# Patient Record
Sex: Male | Born: 1960 | Race: Asian | Hispanic: No | Marital: Married | Smoking: Former smoker
Health system: Southern US, Community
[De-identification: ages and names within clinical notes are randomized; demographics above are authoritative.]

## PROBLEM LIST (undated history)

## (undated) DIAGNOSIS — C801 Malignant (primary) neoplasm, unspecified: Secondary | ICD-10-CM

## (undated) DIAGNOSIS — K209 Esophagitis, unspecified without bleeding: Secondary | ICD-10-CM

## (undated) DIAGNOSIS — C3490 Malignant neoplasm of unspecified part of unspecified bronchus or lung: Secondary | ICD-10-CM

## (undated) DIAGNOSIS — G629 Polyneuropathy, unspecified: Secondary | ICD-10-CM

## (undated) DIAGNOSIS — R4702 Dysphasia: Secondary | ICD-10-CM

## (undated) DIAGNOSIS — K228 Other specified diseases of esophagus: Secondary | ICD-10-CM

## (undated) HISTORY — DX: Other specified diseases of esophagus: K22.8

## (undated) HISTORY — DX: Polyneuropathy, unspecified: G62.9

## (undated) HISTORY — DX: Malignant (primary) neoplasm, unspecified: C80.1

## (undated) HISTORY — DX: Esophagitis, unspecified without bleeding: K20.90

## (undated) HISTORY — DX: Dysphasia: R47.02

## (undated) HISTORY — DX: Esophagitis, unspecified: K20.9

---

## 2012-03-12 ENCOUNTER — Ambulatory Visit (INDEPENDENT_AMBULATORY_CARE_PROVIDER_SITE_OTHER): Payer: PRIVATE HEALTH INSURANCE | Admitting: Family Medicine

## 2012-03-12 VITALS — BP 132/79 | HR 67 | Temp 98.0°F | Resp 16 | Ht 62.5 in | Wt 135.6 lb

## 2012-03-12 DIAGNOSIS — K219 Gastro-esophageal reflux disease without esophagitis: Secondary | ICD-10-CM

## 2012-03-12 DIAGNOSIS — J029 Acute pharyngitis, unspecified: Secondary | ICD-10-CM

## 2012-03-12 LAB — POCT CBC
Granulocyte percent: 61 %G (ref 37–80)
MID (cbc): 0.5 (ref 0–0.9)
MPV: 8.2 fL (ref 0–99.8)
POC Granulocyte: 4.7 (ref 2–6.9)
POC LYMPH PERCENT: 33 %L (ref 10–50)
Platelet Count, POC: 261 10*3/uL (ref 142–424)
RBC: 5.27 M/uL (ref 4.69–6.13)
RDW, POC: 15.2 %

## 2012-03-12 LAB — POCT RAPID STREP A (OFFICE): Rapid Strep A Screen: NEGATIVE

## 2012-03-12 MED ORDER — OMEPRAZOLE 40 MG PO CPDR: 40.0000 mg | DELAYED_RELEASE_CAPSULE | Freq: Every day | ORAL | Status: DC

## 2012-03-12 NOTE — Patient Instructions (Addendum)
Try the medication. If by Monday you are not doing better call back and we will go ahead and work on making an ENT referral for you. If worse at any time return.

## 2012-03-12 NOTE — Progress Notes (Signed)
Subjective: Patient is here complaining of a one-week history of sore throat. His food catches in his throat came back up, especially today. He is [place] and states limited [place]. His relative was helping interpret.  Objective: Throat clear neck supple without nodes points about mid epiglottis region as to where he hurts most. Chest clear. Heart regular without murmurs.  Assessment: Pharyngitis, atypical GERD  Plan: Strep screen Throat cultures negative CBC Then decide rx..  Results for orders placed in visit on 03/12/12  POCT RAPID STREP A (OFFICE)      Component Value Range   Rapid Strep A Screen Negative  Negative   POCT CBC      Component Value Range   WBC 7.7  4.6 - 10.2 (K/uL)   Lymph, poc 2.5  0.6 - 3.4    POC LYMPH PERCENT 33.0  10 - 50 (%L)   MID (cbc) 0.5  0 - 0.9    POC MID % 6.0  0 - 12 (%M)   POC Granulocyte 4.7  2 - 6.9    Granulocyte percent 61.0  37 - 80 (%G)   RBC 5.27  4.69 - 6.13 (M/uL)   Hemoglobin 15.7  14.1 - 18.1 (g/dL)   HCT, POC 16.1  09.6 - 53.7 (%)   MCV 88.5  80 - 97 (fL)   MCH, POC 29.8  27 - 31.2 (pg)   MCHC 33.7  31.8 - 35.4 (g/dL)   RDW, POC 04.5     Platelet Count, POC 261  142 - 424 (K/uL)   MPV 8.2  0 - 99.8 (fL)    with the above labs being normal, it is my opinion that this is probably pharyngitis/dysphasia being caused by reflux. Will prescribe omeprazole, if not doing better over the next few days, then we will go ahead and make an ENT referral. They are to call back if he is not doing better. If at any time he is worse he is to come on in.

## 2012-03-14 LAB — CULTURE, GROUP A STREP

## 2012-04-20 ENCOUNTER — Ambulatory Visit (INDEPENDENT_AMBULATORY_CARE_PROVIDER_SITE_OTHER): Payer: PRIVATE HEALTH INSURANCE | Admitting: Internal Medicine

## 2012-04-20 ENCOUNTER — Ambulatory Visit: Payer: PRIVATE HEALTH INSURANCE

## 2012-04-20 VITALS — BP 112/68 | HR 69 | Temp 97.4°F | Resp 16 | Ht 63.0 in | Wt 133.0 lb

## 2012-04-20 DIAGNOSIS — R079 Chest pain, unspecified: Secondary | ICD-10-CM

## 2012-04-20 MED ORDER — OMEPRAZOLE 40 MG PO CPDR: 40.0000 mg | DELAYED_RELEASE_CAPSULE | Freq: Every day | ORAL | Status: DC

## 2012-04-20 MED ORDER — SUCRALFATE 1 GM/10ML PO SUSP: 1.0000 g | Freq: Four times a day (QID) | ORAL | Status: DC

## 2012-04-20 NOTE — Progress Notes (Signed)
  Subjective:    Patient ID: Bruce [name], male    DOB: 04-Nov-1961, 51 y.o.   MRN: [id number]  HPIHe was here one month ago with dysphasia and was put on omeprazole 40 mg daily. He has had no response. He now has postprandial Dyspepsia with pain at the xiphoid process. He feels like food sticks in the laryngeal area as well as at the xiphoid point. He describes this as chest pain but denies palpitations or shortness of breath and has no history of hypertension. He is a smoker. He drinks one beer a day. There is no history of nocturnal epigastric pain but he does have some early morning burning in the epigastric area. His appetite remains good and there's been no weight loss. 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  HENT: Negative for congestion, rhinorrhea, sneezing, mouth sores and voice change.   Eyes: Positive for redness.  Respiratory: Negative for chest tightness and shortness of breath.   Cardiovascular: Negative for leg swelling.  Gastrointestinal: Negative for nausea, vomiting, diarrhea, constipation and abdominal distention.  Genitourinary: Negative for dysuria and difficulty urinating.  Hematological: Negative for adenopathy.       Objective:   Physical Exam  Constitutional: He is oriented to person, place, and time. He appears well-developed and well-nourished.  HENT:  Nose: Nose normal.  Mouth/Throat: Oropharynx is clear and moist.  Eyes: Pupils are equal, round, and reactive to light.  Neck: Normal range of motion. Neck supple. No tracheal deviation present. No thyromegaly present.  Cardiovascular: Normal rate, regular rhythm, normal heart sounds and intact distal pulses.  Exam reveals no gallop and no friction rub.   No murmur  heard. Pulmonary/Chest: Effort normal and breath sounds normal. No respiratory distress. He has no wheezes. He exhibits no tenderness.  Abdominal: Soft. Bowel sounds are normal. He exhibits no mass. There is no tenderness.  Lymphadenopathy:    He has no cervical adenopathy.  Neurological: He is alert and oriented to person, place, and time.         [facility] reading (PRIMARY) by  Dr. Annaliese Saez=Wide mediastinum/?aortic aneurysm- Stat reading by radiologist said everything is normal    Note that CBC was within normal limits one month ago Assessment & Plan:  Problem #1 dysphagia With postprandial dyspepsia unresponsive to Prilosec 40 Problem #2 abnormal chest x-ray ? Problem #3 esophageal pain postprandial-?stricture  Add Carafate to current therapy 1 g 4 times a day Set up evaluation by endoscopy H. Pylori IgG/metabolic profile

## 2012-04-21 ENCOUNTER — Encounter: Payer: Self-pay | Admitting: Internal Medicine

## 2012-04-21 LAB — COMPREHENSIVE METABOLIC PANEL
ALT: 18 U/L (ref 0–53)
AST: 24 U/L (ref 0–37)
Albumin: 4.4 g/dL (ref 3.5–5.2)
Alkaline Phosphatase: 63 U/L (ref 39–117)
Calcium: 9.1 mg/dL (ref 8.4–10.5)
Chloride: 106 mEq/L (ref 96–112)
Potassium: 4.1 mEq/L (ref 3.5–5.3)
Sodium: 140 mEq/L (ref 135–145)
Total Protein: 7.4 g/dL (ref 6.0–8.3)

## 2012-04-24 ENCOUNTER — Encounter: Payer: Self-pay | Admitting: Internal Medicine

## 2012-05-25 ENCOUNTER — Ambulatory Visit (INDEPENDENT_AMBULATORY_CARE_PROVIDER_SITE_OTHER): Payer: PRIVATE HEALTH INSURANCE | Admitting: Internal Medicine

## 2012-05-25 ENCOUNTER — Encounter: Payer: Self-pay | Admitting: Internal Medicine

## 2012-05-25 VITALS — BP 108/70 | HR 82 | Ht 62.5 in

## 2012-05-25 NOTE — Patient Instructions (Addendum)
You have been scheduled for an endoscopy with dilation with propofol. Please follow written instructions given to you at your visit today.  

## 2012-05-25 NOTE — Progress Notes (Signed)
HISTORY OF PRESENT ILLNESS:  Bruce [name] is a 51 y.o. male , non-English speaking [place] who is sent today regarding dysphagia. He is accompanied by his daughter who translates. Also, a [employer]. Translator. The history is that of one month of intermittent solid food dysphagia item such as rice. He points principally to the pharyngeal area as well as the xiphoid region. He has lost 3-4 pounds. No nausea, vomiting, heartburn, abdominal pain, or lower GI symptoms. He was seen by his primary provider and placed on omeprazole and Carafate. These have not helped. Blood work was obtained including CBC and comprehensive metabolic panel. These were normal. Helicobacter pylori antibody was normal.  REVIEW OF SYSTEMS:  All non-GI ROS negative.  Past Medical History  Diagnosis Date  . Esophagitis     History reviewed. No pertinent past surgical history.  Social History Bruce [name]  reports that he has quit smoking. He does not have any smokeless tobacco history on file. He reports that he does not drink alcohol or use illicit drugs.  family history includes Breast cancer in his sister.  No Known Allergies     PHYSICAL EXAMINATION: Vital signs: BP 108/70  Pulse 82  Ht 5' 2.5" (1.588 m)  SpO2 98%  Constitutional: generally well-appearing, no acute distress Psychiatric: alert and oriented x3, cooperative Eyes: extraocular movements intact, anicteric, conjunctiva pink Mouth: oral pharynx moist, no lesions Neck: supple no lymphadenopathy Cardiovascular: heart regular rate and rhythm, no murmur Lungs: clear to auscultation bilaterally Abdomen: soft, nontender, nondistended, no obvious ascites, no peritoneal signs, normal bowel sounds, no organomegaly Extremities: no lower extremity edema bilaterally Skin: no lesions on visible extremities Neuro: No focal deficits.   ASSESSMENT:  #1. One month history of intermittent solid food dysphagia. Rule out stricture, benign or  malignant.   PLAN:  #1. Upper endoscopy with possible esophageal dilation.The nature of the procedure, as well as the risks, benefits, and alternatives were carefully and thoroughly reviewed with the patient. Ample time for discussion and questions allowed. The patient understood, was satisfied, and agreed to proceed.

## 2012-05-28 ENCOUNTER — Encounter: Payer: Self-pay | Admitting: Internal Medicine

## 2012-05-28 ENCOUNTER — Ambulatory Visit (AMBULATORY_SURGERY_CENTER): Payer: PRIVATE HEALTH INSURANCE | Admitting: Internal Medicine

## 2012-05-28 VITALS — BP 135/93 | HR 63 | Temp 97.3°F | Resp 12 | Ht 63.0 in | Wt 133.0 lb

## 2012-05-28 DIAGNOSIS — K2289 Other specified disease of esophagus: Secondary | ICD-10-CM

## 2012-05-28 DIAGNOSIS — C801 Malignant (primary) neoplasm, unspecified: Secondary | ICD-10-CM | POA: Insufficient documentation

## 2012-05-28 DIAGNOSIS — C159 Malignant neoplasm of esophagus, unspecified: Secondary | ICD-10-CM

## 2012-05-28 DIAGNOSIS — K209 Esophagitis, unspecified without bleeding: Secondary | ICD-10-CM

## 2012-05-28 DIAGNOSIS — K228 Other specified diseases of esophagus: Secondary | ICD-10-CM

## 2012-05-28 DIAGNOSIS — C155 Malignant neoplasm of lower third of esophagus: Secondary | ICD-10-CM

## 2012-05-28 DIAGNOSIS — R1310 Dysphagia, unspecified: Secondary | ICD-10-CM

## 2012-05-28 HISTORY — DX: Other specified disease of esophagus: K22.89

## 2012-05-28 HISTORY — PX: ESOPHAGOGASTRODUODENOSCOPY: SHX1529

## 2012-05-28 HISTORY — DX: Malignant (primary) neoplasm, unspecified: C80.1

## 2012-05-28 MED ORDER — SODIUM CHLORIDE 0.9 % IV SOLN: 500.0000 mL | INTRAVENOUS | Status: DC

## 2012-05-28 NOTE — Op Note (Signed)
[facility] [address] [place], [place]  [address]  ENDOSCOPY PROCEDURE REPORT  PATIENT:  Bruce, [name]  MR#:  [id number] BIRTHDATE:  04/15/1961, 50 yrs. old  GENDER:  male  ENDOSCOPIST:  Wilhemina Bonito. Eda Keys, MD Referred by:  Ellamae Sia, M.D.  PROCEDURE DATE:  05/28/2012 PROCEDURE:  EGD with biopsy, 43239 ASA CLASS:  Class I INDICATIONS:  dysphagia  MEDICATIONS:   MAC sedation, administered by CRNA, propofol (Diprivan) 170 mg IV TOPICAL ANESTHETIC:  none  DESCRIPTION OF PROCEDURE:   After the risks benefits and alternatives of the procedure were thoroughly explained, informed consent was obtained.  The [facility] GIF-H180 [id number] endoscope was introduced through the mouth and advanced to the second portion of the duodenum, without limitations.  The instrument was slowly withdrawn as the mucosa was fully examined. <<PROCEDUREIMAGES>>  A bulky partially obstructing circumferential mass, 4-5cm in lenth (33 - 38 cm and ending at [facility]) was found in the distal esophagus. Multiple bx taken. The stomach was entered and closely examined. The antrum, angularis, and lesser curvature were well visualized, including a retroflexed view of the cardia and fundus. The stomach wall was normally distensable. The scope passed easily through the pylorus into the duodenum.  The duodenal bulb was normal in appearance, as was the postbulbar duodenum.    Retroflexed views revealed a small hiatal hernia.    The scope was then withdrawn from the patient and the procedure completed.  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 Eda Keys, MD  CC:  Ellamae Sia, MD;   The  Patient  n. eSIGNED:   Wilhemina Bonito. Eda Keys at 05/28/2012 01:55 PM  Elmon Else, [phone]

## 2012-05-28 NOTE — Patient Instructions (Addendum)

## 2012-05-28 NOTE — Progress Notes (Signed)
Patient did not experience any of the following events: a burn prior to discharge; a fall within the facility; wrong site/side/patient/procedure/implant event; or a hospital transfer or hospital admission upon discharge from the facility. (G8907) Patient did not have preoperative order for IV antibiotic SSI prophylaxis. (G8918)  

## 2012-05-28 NOTE — Progress Notes (Signed)
Pt. Here with  daughter and interpreter   Encouraged to ask questions and to involve pt. In teaching and conversation about plan of care. Daughter verbalized understanding.  Advised that Dr. Lamar Sprinkles office would schedule appointments and contact pt.

## 2012-05-29 ENCOUNTER — Telehealth: Payer: Self-pay | Admitting: *Deleted

## 2012-05-29 ENCOUNTER — Other Ambulatory Visit: Payer: Self-pay | Admitting: Internal Medicine

## 2012-05-29 ENCOUNTER — Telehealth: Payer: Self-pay | Admitting: Internal Medicine

## 2012-05-29 ENCOUNTER — Ambulatory Visit (INDEPENDENT_AMBULATORY_CARE_PROVIDER_SITE_OTHER)
Admission: RE | Admit: 2012-05-29 | Discharge: 2012-05-29 | Disposition: A | Discharge status: Home / Self Care | Payer: PRIVATE HEALTH INSURANCE | Source: Ambulatory Visit | Attending: Internal Medicine | Admitting: Internal Medicine

## 2012-05-29 ENCOUNTER — Telehealth: Payer: Self-pay

## 2012-05-29 DIAGNOSIS — R079 Chest pain, unspecified: Secondary | ICD-10-CM

## 2012-05-29 DIAGNOSIS — R109 Unspecified abdominal pain: Secondary | ICD-10-CM

## 2012-05-29 DIAGNOSIS — C159 Malignant neoplasm of esophagus, unspecified: Secondary | ICD-10-CM

## 2012-05-29 NOTE — Telephone Encounter (Signed)
Spoke with pts daughter and she is aware, they will come to the office today for xrays.

## 2012-05-29 NOTE — Telephone Encounter (Signed)
  Follow up Call-  Call back number 05/28/2012  Post procedure Call Back phone  # [phone]  Permission to leave phone message Yes     Patient questions:  Do you have a fever, pain , or abdominal swelling? no Pain Score  0 *  Have you tolerated food without any problems? yes  Have you been able to return to your normal activities? yes  Do you have any questions about your discharge instructions: Diet   no Medications  no Follow up visit  no  Do you have questions or concerns about your Care? yes  Actions: * If pain score is 4 or above: No action needed, pain <4.  Spoke with pt. Daughter, interpreting.  Encouraged to call office if questions or concerns.

## 2012-05-29 NOTE — Telephone Encounter (Signed)
Pt had endoscopy yesterday.  Esophageal mass was found on exam.  Pts daughter reports that patient is having pain 5-6 out of 10 in mid chest area.  Reports that it feels like bloating.  Also reports burning in throat.  Explained that a sore throat is common after this procedure and that he could be experiencing gas in chest.  Will report symptoms to Dr Marina Goodell.

## 2012-05-29 NOTE — Telephone Encounter (Signed)
Dr. Rhea Belton as doc of the day please see the note below, Dr. Marina Goodell is not here.

## 2012-05-29 NOTE — Telephone Encounter (Signed)
Pt scheduled for CT of CAP w and w/o contrast at [facility] 06/02/12 arrival time 7:45am for an 8am appt. Referral made to the cancer center also. Pt aware of appt date and time. Pt to go by [facility] radiology today to pick up contrast.

## 2012-05-29 NOTE — Telephone Encounter (Signed)
I recommend PA and lateral chest film and 2 view abdomen film to rule out complication. I doubt complication has occurred, and he could have retained air after the procedure which explains the bloating. If x-ray is negative, could use sucralfate 1 g 4 times a day liquid to help with his discomfort.

## 2012-06-01 ENCOUNTER — Encounter: Payer: Self-pay | Admitting: Oncology

## 2012-06-01 ENCOUNTER — Other Ambulatory Visit: Payer: Self-pay | Admitting: *Deleted

## 2012-06-01 ENCOUNTER — Telehealth: Payer: Self-pay | Admitting: Oncology

## 2012-06-01 ENCOUNTER — Ambulatory Visit (HOSPITAL_BASED_OUTPATIENT_CLINIC_OR_DEPARTMENT_OTHER): Payer: PRIVATE HEALTH INSURANCE | Admitting: Oncology

## 2012-06-01 ENCOUNTER — Ambulatory Visit: Payer: PRIVATE HEALTH INSURANCE

## 2012-06-01 VITALS — BP 130/82 | HR 72 | Temp 96.8°F | Ht 62.0 in | Wt 130.5 lb

## 2012-06-01 DIAGNOSIS — C155 Malignant neoplasm of lower third of esophagus: Secondary | ICD-10-CM

## 2012-06-01 DIAGNOSIS — C159 Malignant neoplasm of esophagus, unspecified: Secondary | ICD-10-CM

## 2012-06-01 MED ORDER — HYDROCODONE-ACETAMINOPHEN 7.5-500 MG/15ML PO SOLN: 15.0000 mL | Freq: Four times a day (QID) | ORAL | Status: DC | PRN

## 2012-06-01 NOTE — Telephone Encounter (Signed)
appts made and printed for pt,Per Almira Coaster she is calling rad onc tomorrow and will call  the pt daughter

## 2012-06-01 NOTE — Telephone Encounter (Signed)
S/w pt's dtr Mai today confirming appt for today @ 3:30 pm. Also called [place] and emailed her requesting [place] translator for this appt.

## 2012-06-01 NOTE — Progress Notes (Signed)
Met with patient and daughter to introduce self and navigator role.  Contact phone numbers given to patient and daughter.   Referrals made to dietician and SW and requested to meet patient during MD visit on 06/04/12. Reviewed with patient not to drive or use heavy machinery/equipment while taking pain medicine and explained reasons why.  Encouraged patient to avoid solid foods like steak and foods that can cause choking.  Encouraged liquids like soup and ice cream per MD recommendation. They verbalized understanding and were without questions. Interpreter was present during the visit.  Will continue to follow.

## 2012-06-01 NOTE — Progress Notes (Signed)
[facility] New Patient Consult   Referring MD: Vern Guerette 51 y.o.  November 05, 1961    Reason for Referral: Esophagus cancer     HPI: [name] is seen today with his daughter and an interpreter. He reports a one-month history of solid dysphagia and recent liquid dysphagia. He also has pain in the subxiphoid area. The pain is intermittent and makes it difficult to sleep. He regurgitates food after eating. He was placed on omeprazole and Carafate without relief of his symptoms.  He saw Dr. Marina Goodell on 05/25/2012. He was taken for an upper endoscopy procedure on 05/28/2012. A partially obstructing circumferential mass was noted in the distal esophagus (33-38 cm) and ending at the GE junction. Multiple biopsies were taken. There was a small hiatal hernia. The stomach and duodenum appeared normal. The pathology from the biopsy is pending. He has been scheduled for staging CT scans on 06/02/2012.    Past Medical History  Diagnosis Date  .  esophagus mass   05/28/2012     Past surgical history: None  Family History  Problem Relation Age of Onset  . Breast cancer Sister   No other family history of cancer  Current outpatient prescriptions:HYDROcodone-acetaminophen (LORTAB) 7.5-500 MG/15ML solution, Take 15 mLs by mouth every 6 (six) hours as needed for pain., Disp: 300 mL, Rfl: 0  Allergies: No Known Allergies  Social History: He lives with his wife and 2 daughters in [place]. He works as a Psychologist, occupational. He quit smoking cigarettes 5 years ago. He drinks approximately 6 bottles of beer on the weekend. No risk factor for HIV or hepatitis.   History  Alcohol Use  . 8.4 oz/week  . 14 Cans of beer per week    History  Smoking status  . Former Smoker -- 0.4 packs/day  Smokeless tobacco  . Never Used     ROS:   Positives include: Solid and liquid dysphasia, 10 pound weight loss, low substernal/subxiphoid chest discomfort at night, regurgitates food  after eating, right hand and numbness for the past one year  A complete ROS was otherwise negative.  Physical Exam:  Blood pressure 130/82, pulse 72, temperature 96.8 F (36 C), temperature source Oral, height 5\' 2"  (1.575 m), weight 130 lb 8 oz (59.194 kg).  HEENT: Oropharynx without visible mass, neck without mass Lungs: Clear bilaterally Cardiac: Regular rate and rhythm Abdomen: No hepatosplenomegaly, no mass, tender in the subxiphoid region GU: Testes without mass  Vascular: No leg edema Lymph nodes: No cervical, supraclavicular, or inguinal nodes. "Shotty "axillary nodes Neurologic: Alert and oriented, the motor exam appears intact in the upper and lower extremities, the deep tendon reflexes are present at the knees and biceps Skin: No rash, multiple small scars at the lower legs   LAB:  CBC  Lab Results  Component Value Date   WBC 7.7 03/12/2012   HGB 15.7 03/12/2012   HCT 46.6 03/12/2012   MCV 88.5 03/12/2012     CMP      Component Value Date/Time   NA 140 04/20/2012 1647   K 4.1 04/20/2012 1647   CL 106 04/20/2012 1647   CO2 24 04/20/2012 1647   GLUCOSE 93 04/20/2012 1647   BUN 17 04/20/2012 1647   CREATININE 1.10 04/20/2012 1647   CALCIUM 9.1 04/20/2012 1647   PROT 7.4 04/20/2012 1647   ALBUMIN 4.4 04/20/2012 1647   AST 24 04/20/2012 1647   ALT 18 04/20/2012 1647   ALKPHOS 63 04/20/2012 1647  BILITOT 0.7 04/20/2012 1647     Radiology: Scheduled for CTs of the chest, abdomen, and pelvis on 06/02/2012    Assessment/Plan:   1. Distal esophagus mass-status post an endoscopic biopsy on 05/28/2012 with the pathology pending  2. Solid/liquid dysphasia secondary to the obstructing esophagus mass  3. Odynophagia/subxiphoid pain secondary to the esophagus mass   Disposition:  He is referred for oncology evaluation after being diagnosed with an esophagus mass. The pathology is pending, but the clinical history and endoscopic findings are consistent with a diagnosis of esophagus  cancer.  I discussed the likely diagnosis and standard treatment plan with the patient and his daughter. He is scheduled for  staging CT scans on 06/02/2012. We will refer him for an endoscopic ultrasound if there is no evidence of distant metastatic disease. He will be referred for a radiation oncology consultation.  Mr.Spilman appears to be in excellent health and will be a candidate for an esophagectomy procedure if there is no distant metastatic disease. We will make a surgical referral pending the staging evaluation.  He was prescribed hydrocodone elixir to use as needed for pain. He will continue a liquid diet. Mr.Toppins will return for an office visit on 06/04/2012. He will be scheduled for an appointment with the cancer Center nutritionist on 06/04/2012 .  Terrie Haring 06/01/2012, 6:06 PM

## 2012-06-01 NOTE — Progress Notes (Signed)
Patient came in today as a new patient with his daughter,and he has one insurance Med cost.I did explain to his daughter our financial assistance and co-pay assistance program we offer here at the cancer center,but his daughter said that she think they are oh kay far as financial assistance.

## 2012-06-02 ENCOUNTER — Ambulatory Visit (HOSPITAL_COMMUNITY)
Admission: RE | Admit: 2012-06-02 | Discharge: 2012-06-02 | Disposition: A | Discharge status: Home / Self Care | Payer: PRIVATE HEALTH INSURANCE | Source: Ambulatory Visit | Attending: Internal Medicine | Admitting: Internal Medicine

## 2012-06-02 ENCOUNTER — Other Ambulatory Visit: Payer: Self-pay | Admitting: Internal Medicine

## 2012-06-02 ENCOUNTER — Encounter: Payer: Self-pay | Admitting: Radiation Oncology

## 2012-06-02 DIAGNOSIS — C159 Malignant neoplasm of esophagus, unspecified: Secondary | ICD-10-CM | POA: Insufficient documentation

## 2012-06-02 DIAGNOSIS — K449 Diaphragmatic hernia without obstruction or gangrene: Secondary | ICD-10-CM | POA: Insufficient documentation

## 2012-06-02 DIAGNOSIS — I708 Atherosclerosis of other arteries: Secondary | ICD-10-CM | POA: Insufficient documentation

## 2012-06-02 DIAGNOSIS — R1310 Dysphagia, unspecified: Secondary | ICD-10-CM | POA: Insufficient documentation

## 2012-06-02 DIAGNOSIS — I700 Atherosclerosis of aorta: Secondary | ICD-10-CM | POA: Insufficient documentation

## 2012-06-02 DIAGNOSIS — R079 Chest pain, unspecified: Secondary | ICD-10-CM | POA: Insufficient documentation

## 2012-06-02 DIAGNOSIS — J8410 Pulmonary fibrosis, unspecified: Secondary | ICD-10-CM | POA: Insufficient documentation

## 2012-06-02 MED ORDER — IOHEXOL 300 MG/ML  SOLN
100.0000 mL | Freq: Once | INTRAMUSCULAR | Status: AC | PRN
  Administered 2012-06-02: 100 mL via INTRAVENOUS

## 2012-06-03 ENCOUNTER — Encounter: Payer: Self-pay | Admitting: *Deleted

## 2012-06-04 ENCOUNTER — Ambulatory Visit: Payer: PRIVATE HEALTH INSURANCE | Admitting: Nutrition

## 2012-06-04 ENCOUNTER — Encounter: Payer: Self-pay | Admitting: *Deleted

## 2012-06-04 ENCOUNTER — Telehealth: Payer: Self-pay | Admitting: Oncology

## 2012-06-04 ENCOUNTER — Ambulatory Visit
Admission: RE | Admit: 2012-06-04 | Discharge: 2012-06-04 | Disposition: A | Discharge status: Home / Self Care | Payer: PRIVATE HEALTH INSURANCE | Source: Ambulatory Visit | Attending: Radiation Oncology | Admitting: Radiation Oncology

## 2012-06-04 ENCOUNTER — Ambulatory Visit (HOSPITAL_BASED_OUTPATIENT_CLINIC_OR_DEPARTMENT_OTHER): Payer: PRIVATE HEALTH INSURANCE | Admitting: Oncology

## 2012-06-04 VITALS — BP 129/81 | HR 75 | Temp 96.9°F | Ht 62.0 in | Wt 128.2 lb

## 2012-06-04 VITALS — BP 109/68 | HR 97 | Temp 98.2°F | Wt 129.4 lb

## 2012-06-04 DIAGNOSIS — C155 Malignant neoplasm of lower third of esophagus: Secondary | ICD-10-CM | POA: Insufficient documentation

## 2012-06-04 DIAGNOSIS — Z510 Encounter for antineoplastic radiation therapy: Secondary | ICD-10-CM | POA: Insufficient documentation

## 2012-06-04 DIAGNOSIS — R1310 Dysphagia, unspecified: Secondary | ICD-10-CM

## 2012-06-04 DIAGNOSIS — Z79899 Other long term (current) drug therapy: Secondary | ICD-10-CM | POA: Insufficient documentation

## 2012-06-04 DIAGNOSIS — K449 Diaphragmatic hernia without obstruction or gangrene: Secondary | ICD-10-CM | POA: Insufficient documentation

## 2012-06-04 DIAGNOSIS — Z87891 Personal history of nicotine dependence: Secondary | ICD-10-CM | POA: Insufficient documentation

## 2012-06-04 DIAGNOSIS — G4700 Insomnia, unspecified: Secondary | ICD-10-CM | POA: Insufficient documentation

## 2012-06-04 DIAGNOSIS — C159 Malignant neoplasm of esophagus, unspecified: Secondary | ICD-10-CM

## 2012-06-04 DIAGNOSIS — Z803 Family history of malignant neoplasm of breast: Secondary | ICD-10-CM | POA: Insufficient documentation

## 2012-06-04 DIAGNOSIS — K209 Esophagitis, unspecified without bleeding: Secondary | ICD-10-CM | POA: Insufficient documentation

## 2012-06-04 NOTE — Progress Notes (Signed)
[facility] Brief Psychosocial Assessment Clinical Social Work  Clinical Social Work was referred by patient navigator for assessment of psychosocial needs.  Clinical Social Worker met with patient and patient's daughter in office at [facility] to offer support and assess for needs.  Pt speaks [place] and his daughter was present to interpret information.  Pt and daughter stated they have an additional interpreter with them "most of the time".  CSW encouraged pt to always have an interpreter with him at his appointments to make sure all information is delivered correctly.  CSW will follow up with pt navigator and scheduling regarding interpreter.  Pt stated he was doing well and had not needs at this time.  Pt did have questions regarding financial information and payment of medical bills.  CSW encouraged pt to discuss his insurance concerns, and ask about the financial assistance application when he meets with the financial advocate today.  CSW informed pt of the resources available and support services at [facility].  CSW provided pt with contact information and encouraged him and/or family to call with any additional questions or concerns.      Tamala Julian, MSW, LCSW Clinical Social Worker [facility] [phone]

## 2012-06-04 NOTE — Telephone Encounter (Signed)
appts made and printed for pt aom °

## 2012-06-04 NOTE — Assessment & Plan Note (Signed)
This patient is a 51 year old patient of Dr. Kalman Drape with a new diagnosis of esophageal cancer.  MEDICAL HISTORY INCLUDES:  Tobacco, alcohol, hiatal hernia, and dysphagia.  MEDICATIONS INCLUDE:  Lortab, Carafate, and omeprazole.  LABORATORIES:  Reviewed.  HEIGHT:  62 inches. WEIGHT:  130.5 pounds. USUAL BODY WEIGHT:  140 pounds. BMI:  23.8.  ESTIMATED NUTRITION NEEDS:  1780-2060 calories, 88-103 g protein, 2 L fluid.  Patient and daughter present with [place] interpreter.  Patient reports solid food dysphagia, but no problems with liquids.  He has experienced a 10-pound weight loss.  Per daughter the patient is now eating well.  He has Ensure at home, but does not like it.  Patient consumes some protein foods such as fish and pork, but no dairy products and very few beans.  He eats rice and soup with every meal.  He does have questions about what he should avoid during treatment.  NUTRITION DIAGNOSIS:  Food and nutrition-related knowledge deficit related to diagnosis of esophageal cancer and associated treatments as evidenced by no prior need for nutrition-related information.  INTERVENTION:  I have educated the patient and daughter on the importance of small frequent meals with higher calorie, higher protein foods to minimize further weight loss.  I have encouraged the patient to chop foods up small and add liquids to them as appropriate to help in swallowing.  I have recommended the patient consume meals and snacks 6 times a day.  I provided him with some samples of juice-based supplements and encouraged him to consume 1-2 of these daily in addition to his oral diet.  I provided my contact information for questions and concerns.  MONITORING/EVALUATION (GOALS):  The patient will tolerate increased oral intake to minimize further weight loss.  NEXT VISIT:  Patient agrees to call with questions or concerns.  ______________________________ Zenovia Jarred, RD,CSO,LDN Clinical Nutrition  Specialist BN/MEDQ  D:  06/04/2012  T:  06/04/2012  Job:  [id number]

## 2012-06-04 NOTE — Progress Notes (Signed)
   [facility]    OFFICE PROGRESS NOTE   INTERVAL HISTORY:   Bruce [name] returns as scheduled. The hydrocodone elixir has helped the pain. Bruce [name] saw Dr. Mitzi Hansen earlier today. 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  Objective:  Physical exam not performed today  X-rays: A CT of the chest on 06/02/2012 revealed a mild distal esophageal wall thickening. No discrete mass or adjacent enlarged lymph nodes were identified. No mediastinal or hilar nodes. No pleural or pericardial effusion. No pulmonary nodule. The liver appeared normal. No adrenal mass. No enlarged abdominal pelvic lymph nodes. 6 mm celiac node. No ascites or peritoneal nodularity. Diffuse gastric wall thickening.  Lab Results:  Lab Results  Component Value Date   WBC 7.7 03/12/2012   HGB 15.7 03/12/2012   HCT 46.6 03/12/2012   MCV 88.5 03/12/2012      Medications: I have reviewed the patient's current medications.  Assessment/Plan: 1.Distal esophagus mass-status post an endoscopic biopsy on 05/28/2012 with the pathology confirming invasive squamous cell carcinoma  -staging CT scans of the chest, abdomen, and pelvis on 06/02/2012-negative for evidence of metastatic disease  2. Solid/liquid dysphasia secondary to the obstructing esophagus mass   3. 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 Bruce [name] appears to have localized disease based on the staging CT scans. Bruce [name] will be referred for a staging PET scan and endoscopic ultrasound evaluation to determine whether Bruce [name] should be treated with chemotherapy/irradiation or surgery. Bruce [name] understands the majority of patients with esophagus cancer are treated with initial chemotherapy/radiation.  I discussed treatment options and the specifics of the Taxol/carboplatin regimen with Mr.Chandra and his  daughter via an interpreter. I will recommend chemotherapy with weekly Taxol/carboplatin and concurrent radiation. I discussed the case with Dr. Mitzi Hansen and the plan is to initiate therapy during the week of 06/15/2012. If Bruce [name] is found to have an early-stage lesion on the endoscopic ultrasound we will make a surgical referral.  I reviewed the potential toxicities associated with Taxol/carboplatin including a chance for nausea/vomiting, mucositis, diarrhea, alopecia, and hematologic toxicity. We discussed the chance of an allergic reaction, neuropathy, and bone pain. Bruce [name] appears to have adequate peripheral IV access for chemotherapy. Bruce [name] will attend a chemotherapy teaching class.  The first cycle of Taxol/carboplatin has been scheduled for 06/16/2012. Bruce [name] will be seen for an office visit that day.   Thornton Papas, MD  06/04/2012  5:57 PM

## 2012-06-04 NOTE — Progress Notes (Signed)
Bruce [name] in for consult today for Esophageal Cancer.  He reports vis Daughter and interpreter.  Continues to have discomfort in the epigastric region but lessened since starting Carafate and Omeprazole.  Eating softer foods due to Dysphagia.  Has lost ~ 10 lbs within the past month.

## 2012-06-05 ENCOUNTER — Telehealth: Payer: Self-pay | Admitting: *Deleted

## 2012-06-05 NOTE — Telephone Encounter (Signed)
CALLED PATIENT TO INFORM OF TEST FOR 06-11-12, NO ANSWER, WILL GIVE AN APPT. CARD WHEN HE COMES IN ON Monday - 06-08-12

## 2012-06-06 DIAGNOSIS — C155 Malignant neoplasm of lower third of esophagus: Secondary | ICD-10-CM | POA: Insufficient documentation

## 2012-06-06 NOTE — Progress Notes (Signed)
[facility] Radiation Oncology NEW PATIENT EVALUATION  Name: Bruce [name] MRN: [id number]  Date:   06/04/2012           DOB: Sep 06, 1961  Status: outpatient   CC: No primary provider on file.  Ladene Artist, MD    REFERRING PHYSICIAN: Ladene Artist, MD   DIAGNOSIS: The encounter diagnosis was Malignant neoplasm of lower third of esophagus.    HISTORY OF PRESENT ILLNESS:  Bruce [name] is a 51 y.o. male who is seen today for a new consultation visit regarding his recent diagnosis of esophageal cancer. The patient indicates that he has had a one-month history approximately of some dysphasia. This has worsened and does include liquids to some degree currently. He has lost approximately 10 pounds within the last month. He is also noticed some regurgitation of food after eating as well as some pain in the chest region which they described as being present underneath his anterior chest. The patient took some medications including omeprazole without significant relief.  The patient proceeded with upper endoscopy. This revealed a partially obstructing mass which was noted in the distal esophagus at 33-38 cm. This ended it appeared that the GE junction as best could be seen. Multiple biopsies were taken. A small hiatal hernia was present. The biopsy of this mass returned positive for invasive squamous cell carcinoma.  CT imaging has been obtained. A CT scan of the chest revealed some distal esophageal wall thickening and a small hiatal hernia. There is no evidence of abdominal pelvic metastatic disease. There were some nonspecific diffuse gastric wall thickening possibly related to incomplete distention although another differential involved gastritis. The patient has discussed potential treatment options and has seen Dr. Truett Perna who discussed chemotherapy. I am seeing the patient today for consideration of possible radiation treatment.  PREVIOUS RADIATION THERAPY: No   PAST  MEDICAL HISTORY:  has a past medical history of Esophagitis; Mass of esophagus (05/28/2012); Dysphasia; Neuropathy; Hiatal hernia; and Cancer (05/28/12).     PAST SURGICAL HISTORY:  Past Surgical History  Procedure Date  . Esophagogastroduodenoscopy 05/28/12    with biopsy mass distal esophagus ending ge junction  =invasiver squamous cell ca     FAMILY HISTORY: family history includes Breast cancer in his sister.   SOCIAL HISTORY:  reports that he has quit smoking. He quit smokeless tobacco use about 5 years ago. He reports that he drinks about 8.4 ounces of alcohol per week. He reports that he does not use illicit drugs.   ALLERGIES: Review of patient's allergies indicates no known allergies.   MEDICATIONS:  Current Outpatient Prescriptions  Medication Sig Dispense Refill  . CARAFATE 1 GM/10ML suspension       . HYDROcodone-acetaminophen (LORTAB) 7.5-500 MG/15ML solution Take 15 mLs by mouth every 6 (six) hours as needed for pain.  300 mL  0  . omeprazole (PRILOSEC) 40 MG capsule          REVIEW OF SYSTEMS:  A comprehensive review of systems was negative. except for that listed in the history of present illness    PHYSICAL EXAM:  vitals were not taken for this visit.  General: Well-developed, in no acute distress HEENT: Normocephalic, atraumatic; oral cavity clear Neck: Supple without any lymphadenopathy Cardiovascular: Regular rate and rhythm Respiratory: Clear to auscultation bilaterally GI: Soft, nontender, normal bowel sounds Extremities: No edema present Neuro: No focal deficits     LABORATORY DATA:  Lab Results  Component Value Date   WBC 7.7 03/12/2012  HGB 15.7 03/12/2012   HCT 46.6 03/12/2012   MCV 88.5 03/12/2012   Lab Results  Component Value Date   NA 140 04/20/2012   K 4.1 04/20/2012   CL 106 04/20/2012   CO2 24 04/20/2012   Lab Results  Component Value Date   ALT 18 04/20/2012   AST 24 04/20/2012   ALKPHOS 63 04/20/2012   BILITOT 0.7 04/20/2012        IMPRESSION: Pleasant 51 year old male with a recent diagnosis of esophageal cancer.   PLAN: The patient likely is an appropriate candidate for chemoradiotherapy initially. I have discussed the rationale of radiation in the context of esophageal cancer. I would like to order a PET scan to help that her clarify the local extent of the tumor. This will also help to identify any regional or metastatic disease. Endoscopic ultrasound has not been completed as of yet. Depending on the results of the PET scan this may be warranted. I believe that we're this would really make a difference is that there is not any regional or distant disease seen on the PET scan and we want to see if the patient is presenting with a very early stage tumor which could be managed in a different manner. Given his clinical symptoms I do not believe that this is the case and in all likelihood chemoradiotherapy would be warranted for the patient but we will gather some more information in this regard.  We have tentatively gone ahead and scheduled the patient for a simulation next week such that can proceed with treatment planning. We will potentially be ready to start chemoradiotherapy on 06/15/2012. I discussed with the patient with the help of an interpreter the side effects from treatment which can be expected. I would anticipate a 5-1/2 week course of treatment. All of his questions have been answered.   I spent 60 minutes minutes face to face with the patient and more than 50% of that time was spent in counseling and/or coordination of care.

## 2012-06-08 ENCOUNTER — Telehealth: Payer: Self-pay

## 2012-06-08 ENCOUNTER — Ambulatory Visit
Admission: RE | Admit: 2012-06-08 | Discharge: 2012-06-08 | Disposition: A | Discharge status: Home / Self Care | Payer: PRIVATE HEALTH INSURANCE | Source: Ambulatory Visit | Attending: Radiation Oncology | Admitting: Radiation Oncology

## 2012-06-08 DIAGNOSIS — C155 Malignant neoplasm of lower third of esophagus: Secondary | ICD-10-CM

## 2012-06-08 DIAGNOSIS — C159 Malignant neoplasm of esophagus, unspecified: Secondary | ICD-10-CM

## 2012-06-08 NOTE — Telephone Encounter (Signed)
Message copied by Donata Duff on Mon Jun 08, 2012  8:30 AM ------      Message from: [place]um[place], Melton Alar      Created: Sun Jun 07, 2012  7:29 AM       Melaine Mcphee,      He needs upper EUS (45 min, does not need propofol) for newly diagnosed esophageal cancer.  Radial only.   Ideally this Thursday (this is the second highest priority case to add to this Thursday schedule, after Mr. Jacqulyn Ducking) .  If there is no space, then would do as a lunch time case (tues, wed or Friday).  His children speak english well, but he will need a Systems developer if they cannot be there. Thanks                        Daphene Jaeger, we'll get him in.            dan                  ----- Message -----         From: Cira Rue, RN         Sent: 06/04/2012   5:29 PM           To: Rachael Fee, MD, Donata Duff, CMA, #            Dr. Truett Perna has put a referral in to you for this patient to be seen and have an EUS as soon as possible.  Diagnosis: Invasive squamous cell carcinoma of esophagus.  [place] is [place] and needs a Nurse, learning disability.  His children speak English and usually come with him.  He will be presented at the 06/17/12 cancer conference and Dr. Tyrone Sage has been made aware.      Thank you,      Vicie Mutters

## 2012-06-09 ENCOUNTER — Other Ambulatory Visit: Payer: Self-pay

## 2012-06-09 ENCOUNTER — Other Ambulatory Visit: Payer: Self-pay | Admitting: *Deleted

## 2012-06-09 MED ORDER — DEXAMETHASONE 4 MG PO TABS: ORAL_TABLET | ORAL | Status: AC

## 2012-06-09 MED ORDER — PROCHLORPERAZINE MALEATE 10 MG PO TABS: 10.0000 mg | ORAL_TABLET | Freq: Four times a day (QID) | ORAL | Status: DC | PRN

## 2012-06-09 NOTE — Telephone Encounter (Signed)
I spoke with the pt's daughter and she is aware of the EUS scheduled for Thursday 06/11/12.  Medications were reviewed.  Pt has PET scan at 1130 and will go directly to ENDO afterwards.  Pt's daughter will be with him due to the language barrier.

## 2012-06-10 ENCOUNTER — Other Ambulatory Visit: Payer: PRIVATE HEALTH INSURANCE

## 2012-06-10 ENCOUNTER — Encounter: Payer: Self-pay | Admitting: *Deleted

## 2012-06-11 ENCOUNTER — Encounter (HOSPITAL_COMMUNITY): Payer: Self-pay

## 2012-06-11 ENCOUNTER — Encounter (HOSPITAL_COMMUNITY): Payer: Self-pay | Admitting: *Deleted

## 2012-06-11 ENCOUNTER — Encounter (HOSPITAL_COMMUNITY)
Admission: RE | Admit: 2012-06-11 | Discharge: 2012-06-11 | Disposition: A | Discharge status: Home / Self Care | Payer: PRIVATE HEALTH INSURANCE | Source: Ambulatory Visit | Attending: Radiation Oncology | Admitting: Radiation Oncology

## 2012-06-11 ENCOUNTER — Encounter (HOSPITAL_COMMUNITY)
Admission: RE | Disposition: A | Discharge status: Home / Self Care | Payer: Self-pay | Source: Ambulatory Visit | Attending: Gastroenterology

## 2012-06-11 ENCOUNTER — Ambulatory Visit (HOSPITAL_COMMUNITY)
Admission: RE | Admit: 2012-06-11 | Discharge: 2012-06-11 | Disposition: A | Discharge status: Home / Self Care | Payer: PRIVATE HEALTH INSURANCE | Source: Ambulatory Visit | Attending: Gastroenterology | Admitting: Gastroenterology

## 2012-06-11 DIAGNOSIS — Z87891 Personal history of nicotine dependence: Secondary | ICD-10-CM | POA: Insufficient documentation

## 2012-06-11 DIAGNOSIS — C155 Malignant neoplasm of lower third of esophagus: Secondary | ICD-10-CM

## 2012-06-11 DIAGNOSIS — Z803 Family history of malignant neoplasm of breast: Secondary | ICD-10-CM | POA: Insufficient documentation

## 2012-06-11 DIAGNOSIS — C160 Malignant neoplasm of cardia: Secondary | ICD-10-CM | POA: Insufficient documentation

## 2012-06-11 HISTORY — PX: EUS: SHX5427

## 2012-06-11 SURGERY — UPPER ENDOSCOPIC ULTRASOUND (EUS) RADIAL
Anesthesia: Moderate Sedation

## 2012-06-11 MED ORDER — FENTANYL CITRATE 0.05 MG/ML IJ SOLN
INTRAMUSCULAR | Status: AC
  Filled 2012-06-11: qty 2

## 2012-06-11 MED ORDER — FLUDEOXYGLUCOSE F - 18 (FDG) INJECTION
17.8000 | Freq: Once | INTRAVENOUS | Status: AC | PRN
  Administered 2012-06-11: 17.8 via INTRAVENOUS

## 2012-06-11 MED ORDER — BUTAMBEN-TETRACAINE-BENZOCAINE 2-2-14 % EX AERO
INHALATION_SPRAY | CUTANEOUS | Status: DC | PRN
  Administered 2012-06-11: 2 via TOPICAL

## 2012-06-11 MED ORDER — MIDAZOLAM HCL 10 MG/2ML IJ SOLN
INTRAMUSCULAR | Status: AC
  Filled 2012-06-11: qty 2

## 2012-06-11 MED ORDER — FENTANYL CITRATE 0.05 MG/ML IJ SOLN
INTRAMUSCULAR | Status: DC | PRN
  Administered 2012-06-11 (×4): 25 ug via INTRAVENOUS

## 2012-06-11 MED ORDER — MIDAZOLAM HCL 10 MG/2ML IJ SOLN
INTRAMUSCULAR | Status: DC | PRN
  Administered 2012-06-11 (×4): 2.5 mg via INTRAVENOUS

## 2012-06-11 MED ORDER — SODIUM CHLORIDE 0.9 % IV SOLN
Freq: Once | INTRAVENOUS | Status: AC
  Administered 2012-06-11: 10 mL/h via INTRAVENOUS

## 2012-06-11 NOTE — H&P (View-Only) (Signed)
[facility] New Patient Consult   Referring MD: John Perry   Telesforo Manni 51 y.o.  04/11/1961    Reason for Referral: Esophagus cancer     HPI: Mr. Dershem is seen today with his daughter and an interpreter. He reports a one-month history of solid dysphagia and recent liquid dysphagia. He also has pain in the subxiphoid area. The pain is intermittent and makes it difficult to sleep. He regurgitates food after eating. He was placed on omeprazole and Carafate without relief of his symptoms.  He saw Dr. Perry on 05/25/2012. He was taken for an upper endoscopy procedure on 05/28/2012. A partially obstructing circumferential mass was noted in the distal esophagus (33-38 cm) and ending at the GE junction. Multiple biopsies were taken. There was a small hiatal hernia. The stomach and duodenum appeared normal. The pathology from the biopsy is pending. He has been scheduled for staging CT scans on 06/02/2012.    Past Medical History  Diagnosis Date  .  esophagus mass   05/28/2012     Past surgical history: None  Family History  Problem Relation Age of Onset  . Breast cancer Sister   No other family history of cancer  Current outpatient prescriptions:HYDROcodone-acetaminophen (LORTAB) 7.5-500 MG/15ML solution, Take 15 mLs by mouth every 6 (six) hours as needed for pain., Disp: 300 mL, Rfl: 0  Allergies: No Known Allergies  Social History: He lives with his wife and 2 daughters in [place]. He works as a welder. He quit smoking cigarettes 5 years ago. He drinks approximately 6 bottles of beer on the weekend. No risk factor for HIV or hepatitis.   History  Alcohol Use  . 8.4 oz/week  . 14 Cans of beer per week    History  Smoking status  . Former Smoker -- 0.4 packs/day  Smokeless tobacco  . Never Used     ROS:   Positives include: Solid and liquid dysphasia, 10 pound weight loss, low substernal/subxiphoid chest discomfort at night, regurgitates food  after eating, right hand and numbness for the past one year  A complete ROS was otherwise negative.  Physical Exam:  Blood pressure 130/82, pulse 72, temperature 96.8 F (36 C), temperature source Oral, height 5' 2" (1.575 m), weight 130 lb 8 oz (59.194 kg).  HEENT: Oropharynx without visible mass, neck without mass Lungs: Clear bilaterally Cardiac: Regular rate and rhythm Abdomen: No hepatosplenomegaly, no mass, tender in the subxiphoid region GU: Testes without mass  Vascular: No leg edema Lymph nodes: No cervical, supraclavicular, or inguinal nodes. "Shotty "axillary nodes Neurologic: Alert and oriented, the motor exam appears intact in the upper and lower extremities, the deep tendon reflexes are present at the knees and biceps Skin: No rash, multiple small scars at the lower legs   LAB:  CBC  Lab Results  Component Value Date   WBC 7.7 03/12/2012   HGB 15.7 03/12/2012   HCT 46.6 03/12/2012   MCV 88.5 03/12/2012     CMP      Component Value Date/Time   NA 140 04/20/2012 1647   K 4.1 04/20/2012 1647   CL 106 04/20/2012 1647   CO2 24 04/20/2012 1647   GLUCOSE 93 04/20/2012 1647   BUN 17 04/20/2012 1647   CREATININE 1.10 04/20/2012 1647   CALCIUM 9.1 04/20/2012 1647   PROT 7.4 04/20/2012 1647   ALBUMIN 4.4 04/20/2012 1647   AST 24 04/20/2012 1647   ALT 18 04/20/2012 1647   ALKPHOS 63 04/20/2012 1647     BILITOT 0.7 04/20/2012 1647     Radiology: Scheduled for CTs of the chest, abdomen, and pelvis on 06/02/2012    Assessment/Plan:   1. Distal esophagus mass-status post an endoscopic biopsy on 05/28/2012 with the pathology pending  2. Solid/liquid dysphasia secondary to the obstructing esophagus mass  3. Odynophagia/subxiphoid pain secondary to the esophagus mass   Disposition:  He is referred for oncology evaluation after being diagnosed with an esophagus mass. The pathology is pending, but the clinical history and endoscopic findings are consistent with a diagnosis of esophagus  cancer.  I discussed the likely diagnosis and standard treatment plan with the patient and his daughter. He is scheduled for  staging CT scans on 06/02/2012. We will refer him for an endoscopic ultrasound if there is no evidence of distant metastatic disease. He will be referred for a radiation oncology consultation.  Mr.Springborn appears to be in excellent health and will be a candidate for an esophagectomy procedure if there is no distant metastatic disease. We will make a surgical referral pending the staging evaluation.  He was prescribed hydrocodone elixir to use as needed for pain. He will continue a liquid diet. Mr.Hulon will return for an office visit on 06/04/2012. He will be scheduled for an appointment with the cancer Center nutritionist on 06/04/2012 .  Lamone Ferrelli 06/01/2012, 6:06 PM     

## 2012-06-11 NOTE — Discharge Instructions (Signed)

## 2012-06-11 NOTE — Interval H&P Note (Signed)
History and Physical Interval Note:  06/11/2012 2:01 PM  Bruce [name]  has presented today for surgery, with the diagnosis of Esophageal cancer [150.9]  The various methods of treatment have been discussed with the patient and family. After consideration of risks, benefits and other options for treatment, the patient has consented to  Procedure(s) (LRB): UPPER ENDOSCOPIC ULTRASOUND (EUS) RADIAL (N/A) as a surgical intervention .  The patient's history has been reviewed, patient examined, no change in status, stable for surgery.  I have reviewed the patient's chart and labs.  Questions were answered to the patient's satisfaction.     Rob Bunting

## 2012-06-11 NOTE — Op Note (Signed)
[facility] [address] [place], [place]  [address]  ENDOSCOPIC ULTRASOUND PROCEDURE REPORT  PATIENT:  Bruce [name], Bruce [name]  MR#:  [id number] BIRTHDATE:  [birth year]  GENDER:  male ENDOSCOPIST:  Rachael Fee, MD REFERRED BY:  Lavada Mesi. Truett Perna, M.D. Dorothy Puffer, MD Wilhemina Bonito. Eda Keys., M.D. PROCEDURE DATE:  06/11/2012 PROCEDURE:  Upper EUS ASA CLASS:  Class II INDICATIONS:  recently diagnosed with GE junction adenocarcinoma by Dr. Marina Goodell.  Imaging has shown no clear sign of distant disease.  MEDICATIONS:   Fentanyl 100 mcg IV, Versed 10 mg IV DESCRIPTION OF PROCEDURE:   After the risks benefits and alternatives of the procedure were  explained, informed consent was obtained. The patient was then placed in the left, lateral, decubitus postion and IV sedation was administered. Throughout the procedure, the patient's blood pressure, pulse and oxygen saturations were monitored continuously.  Under direct visualization, the 110036 endoscope was introduced through the mouth and advanced to the second portion of the duodenum.  Water was used as necessary to provide an acoustic interface.  Upon completion of the imaging, water was removed and the patient was sent to the recovery room in satisfactory condition. <<PROCEDUREIMAGES>> Endoscopic findings: 1. Circumferential, partially obstructing, clearly malignant 4cm long mass with distal end at GE junction (40cm).  EUS findings: 1. The mass above corresponds with a hypoechoic, irregularly bordered, 9mm deep, 4cm long mass in distal esophagus that clearly passes into and through the muscularis propria layer of the esophageal wall (uT3). 2. There were three small, round, discrete, hypoechoic lymphnodes (gastrohepatic ligament, paraesophageal) that were suspicious for malignant involvement (uN1).  Impression: uT3N1 4cm long, circumferential, partially obstructing, distal esophagus adenocarcinoma with distal edge at GE junction  (40cm from incisors).  He will likely benefit from neoadjuvant chemo/xrt.  ______________________________ Rachael Fee, MD  n. eSIGNED:   Rachael Fee at 06/11/2012 02:40 PM  Elmon Else, [id number]

## 2012-06-12 ENCOUNTER — Encounter (HOSPITAL_COMMUNITY): Payer: Self-pay | Admitting: Gastroenterology

## 2012-06-12 ENCOUNTER — Encounter (HOSPITAL_COMMUNITY): Payer: Self-pay

## 2012-06-14 ENCOUNTER — Other Ambulatory Visit: Payer: Self-pay | Admitting: Oncology

## 2012-06-15 ENCOUNTER — Ambulatory Visit: Payer: PRIVATE HEALTH INSURANCE

## 2012-06-15 ENCOUNTER — Ambulatory Visit
Admission: RE | Admit: 2012-06-15 | Discharge: 2012-06-15 | Disposition: A | Discharge status: Home / Self Care | Payer: PRIVATE HEALTH INSURANCE | Source: Ambulatory Visit | Attending: Radiation Oncology | Admitting: Radiation Oncology

## 2012-06-15 ENCOUNTER — Encounter: Payer: Self-pay | Admitting: Radiation Oncology

## 2012-06-15 DIAGNOSIS — C155 Malignant neoplasm of lower third of esophagus: Secondary | ICD-10-CM

## 2012-06-16 ENCOUNTER — Other Ambulatory Visit: Payer: Self-pay | Admitting: *Deleted

## 2012-06-16 ENCOUNTER — Ambulatory Visit (HOSPITAL_BASED_OUTPATIENT_CLINIC_OR_DEPARTMENT_OTHER): Payer: PRIVATE HEALTH INSURANCE

## 2012-06-16 ENCOUNTER — Ambulatory Visit: Payer: PRIVATE HEALTH INSURANCE | Admitting: Nutrition

## 2012-06-16 ENCOUNTER — Ambulatory Visit (HOSPITAL_BASED_OUTPATIENT_CLINIC_OR_DEPARTMENT_OTHER): Payer: PRIVATE HEALTH INSURANCE | Admitting: Oncology

## 2012-06-16 ENCOUNTER — Ambulatory Visit
Admission: RE | Admit: 2012-06-16 | Discharge: 2012-06-16 | Disposition: A | Discharge status: Home / Self Care | Payer: PRIVATE HEALTH INSURANCE | Source: Ambulatory Visit | Attending: Radiation Oncology | Admitting: Radiation Oncology

## 2012-06-16 ENCOUNTER — Telehealth: Payer: Self-pay | Admitting: *Deleted

## 2012-06-16 ENCOUNTER — Ambulatory Visit: Payer: PRIVATE HEALTH INSURANCE

## 2012-06-16 ENCOUNTER — Telehealth: Payer: Self-pay | Admitting: Oncology

## 2012-06-16 ENCOUNTER — Encounter: Payer: PRIVATE HEALTH INSURANCE | Admitting: Nutrition

## 2012-06-16 VITALS — BP 115/73 | HR 79 | Temp 98.0°F

## 2012-06-16 VITALS — BP 110/77 | HR 75 | Temp 98.6°F | Ht 62.0 in | Wt 126.8 lb

## 2012-06-16 DIAGNOSIS — C155 Malignant neoplasm of lower third of esophagus: Secondary | ICD-10-CM

## 2012-06-16 DIAGNOSIS — Z5111 Encounter for antineoplastic chemotherapy: Secondary | ICD-10-CM

## 2012-06-16 DIAGNOSIS — C159 Malignant neoplasm of esophagus, unspecified: Secondary | ICD-10-CM

## 2012-06-16 LAB — COMPREHENSIVE METABOLIC PANEL
BUN: 12 mg/dL (ref 6–23)
CO2: 24 mEq/L (ref 19–32)
Creatinine, Ser: 1.03 mg/dL (ref 0.50–1.35)
Glucose, Bld: 106 mg/dL — ABNORMAL HIGH (ref 70–99)
Sodium: 137 mEq/L (ref 135–145)
Total Bilirubin: 0.6 mg/dL (ref 0.3–1.2)
Total Protein: 7.9 g/dL (ref 6.0–8.3)

## 2012-06-16 MED ORDER — ONDANSETRON 16 MG/50ML IVPB (CHCC)
16.0000 mg | Freq: Once | INTRAVENOUS | Status: AC
  Administered 2012-06-16: 16 mg via INTRAVENOUS

## 2012-06-16 MED ORDER — DEXAMETHASONE SODIUM PHOSPHATE 4 MG/ML IJ SOLN
20.0000 mg | Freq: Once | INTRAMUSCULAR | Status: AC
  Administered 2012-06-16: 20 mg via INTRAVENOUS

## 2012-06-16 MED ORDER — DIPHENHYDRAMINE HCL 50 MG/ML IJ SOLN
50.0000 mg | Freq: Once | INTRAMUSCULAR | Status: AC
  Administered 2012-06-16: 50 mg via INTRAVENOUS

## 2012-06-16 MED ORDER — SODIUM CHLORIDE 0.9 % IV SOLN
Freq: Once | INTRAVENOUS | Status: AC
  Administered 2012-06-16: 10:00:00 via INTRAVENOUS

## 2012-06-16 MED ORDER — FAMOTIDINE IN NACL 20-0.9 MG/50ML-% IV SOLN
20.0000 mg | Freq: Once | INTRAVENOUS | Status: AC
  Administered 2012-06-16: 20 mg via INTRAVENOUS

## 2012-06-16 MED ORDER — PACLITAXEL CHEMO INJECTION 300 MG/50ML
50.0000 mg/m2 | Freq: Once | INTRAVENOUS | Status: AC
  Administered 2012-06-16: 78 mg via INTRAVENOUS
  Filled 2012-06-16: qty 13

## 2012-06-16 MED ORDER — HYDROCODONE-ACETAMINOPHEN 7.5-500 MG/15ML PO SOLN: 15.0000 mL | Freq: Four times a day (QID) | ORAL | Status: DC | PRN

## 2012-06-16 MED ORDER — CARBOPLATIN CHEMO INJECTION 450 MG/45ML
192.8000 mg | Freq: Once | INTRAVENOUS | Status: AC
  Administered 2012-06-16: 190 mg via INTRAVENOUS
  Filled 2012-06-16: qty 19

## 2012-06-16 NOTE — Patient Instructions (Signed)
[facility] Discharge Instructions for Patients Receiving Chemotherapy  Today you received the following chemotherapy agents Taxol and Carboplatin.  To help prevent nausea and vomiting after your treatment, we encourage you to take your nausea medication as prescribed.  If you develop nausea and vomiting that is not controlled by your nausea medication, call the clinic. If it is after clinic hours your family physician or the after hours number for the clinic or go to the Emergency Department.   BELOW ARE SYMPTOMS THAT SHOULD BE REPORTED IMMEDIATELY:  *FEVER GREATER THAN 100.5 F  *CHILLS WITH OR WITHOUT FEVER  NAUSEA AND VOMITING THAT IS NOT CONTROLLED WITH YOUR NAUSEA MEDICATION  *UNUSUAL SHORTNESS OF BREATH  *UNUSUAL BRUISING OR BLEEDING  TENDERNESS IN MOUTH AND THROAT WITH OR WITHOUT PRESENCE OF ULCERS  *URINARY PROBLEMS  *BOWEL PROBLEMS  UNUSUAL RASH Items with * indicate a potential emergency and should be followed up as soon as possible.  One of the nurses will contact you 24 hours after your treatment. Please let the nurse know about any problems that you may have experienced. Feel free to call the clinic you have any questions or concerns. The clinic phone number is [phone].   I have been informed and understand all the instructions given to me. I know to contact the clinic, my physician, or go to the Emergency Department if any problems should occur. I do not have any questions at this time, but understand that I may call the clinic during office hours   should I have any questions or need assistance in obtaining follow up care.    Paclitaxel (Taxol) What is this medicine? PACLITAXEL (PAK li TAX el) is a chemotherapy drug. It targets fast dividing cells, like cancer cells, and causes these cells to die. This medicine is used to treat ovarian cancer, breast cancer, and other cancers. This medicine may be used for other purposes; ask your health  care provider or pharmacist if you have questions. What should I tell my health care provider before I take this medicine? They need to know if you have any of these conditions: -blood disorders -irregular heartbeat -infection (especially a virus infection such as chickenpox, cold sores, or herpes) -liver disease -previous or ongoing radiation therapy -an unusual or allergic reaction to paclitaxel, alcohol, polyoxyethylated castor oil, other chemotherapy agents, other medicines, foods, dyes, or preservatives -pregnant or trying to get pregnant -breast-feeding   How should I use this medicine? This drug is given as an infusion into a vein. It is administered in a hospital or clinic by a specially trained health care professional. Talk to your pediatrician regarding the use of this medicine in children. Special care may be needed. Overdosage: If you think you have taken too much of this medicine contact a poison control center or emergency room at once. NOTE: This medicine is only for you. Do not share this medicine with others. What if I miss a dose? It is important not to miss your dose. Call your doctor or health care professional if you are unable to keep an appointment. What may interact with this medicine? Do not take this medicine with any of the following medications: -disulfiram -metronidazole This medicine may also interact with the following medications: -cyclosporine -dexamethasone -diazepam -ketoconazole -medicines to increase blood counts like filgrastim, pegfilgrastim, sargramostim -other chemotherapy drugs like cisplatin, doxorubicin, epirubicin, etoposide, teniposide, vincristine -quinidine -testosterone -vaccines -verapamil Talk to your doctor or health care professional before taking any of these medicines: -acetaminophen -  aspirin -ibuprofen -ketoprofen -naproxen This list may not describe all possible interactions. Give your health care provider a list of all  the medicines, herbs, non-prescription drugs, or dietary supplements you use. Also tell them if you smoke, drink alcohol, or use illegal drugs. Some items may interact with your medicine.  What should I watch for while using this medicine? Your condition will be monitored carefully while you are receiving this medicine. You will need important blood work done while you are taking this medicine. This drug may make you feel generally unwell. This is not uncommon, as chemotherapy can affect healthy cells as well as cancer cells. Report any side effects. Continue your course of treatment even though you feel ill unless your doctor tells you to stop. In some cases, you may be given additional medicines to help with side effects. Follow all directions for their use. Call your doctor or health care professional for advice if you get a fever, chills or sore throat, or other symptoms of a cold or flu. Do not treat yourself. This drug decreases your body's ability to fight infections. Try to avoid being around people who are sick.  This medicine may increase your risk to bruise or bleed. Call your doctor or health care professional if you notice any unusual bleeding. Be careful brushing and flossing your teeth or using a toothpick because you may get an infection or bleed more easily. If you have any dental work done, tell your dentist you are receiving this medicine. Avoid taking products that contain aspirin, acetaminophen, ibuprofen, naproxen, or ketoprofen unless instructed by your doctor. These medicines may hide a fever. Do not become pregnant while taking this medicine. Women should inform their doctor if they wish to become pregnant or think they might be pregnant. There is a potential for serious side effects to an unborn child. Talk to your health care professional or pharmacist for more information. Do not breast-feed an infant while taking this medicine. Men are advised not to father a child while  receiving this medicine. What side effects may I notice from receiving this medicine?  Side effects that you should report to your doctor or health care professional as soon as possible: -allergic reactions like skin rash, itching or hives, swelling of the face, lips, or tongue -low blood counts - This drug may decrease the number of white blood cells, red blood cells and platelets. You may be at increased risk for infections and bleeding. -signs of infection - fever or chills, cough, sore throat, pain or difficulty passing urine -signs of decreased platelets or bleeding - bruising, pinpoint red spots on the skin, black, tarry stools, nosebleeds -signs of decreased red blood cells - unusually weak or tired, fainting spells, lightheadedness -breathing problems -chest pain -high or low blood pressure -mouth sores -nausea and vomiting -pain, swelling, redness or irritation at the injection site -pain, tingling, numbness in the hands or feet -slow or irregular heartbeat -swelling of the ankle, feet, hands  Side effects that usually do not require medical attention (report to your doctor or health care professional if they continue or are bothersome): -bone pain -complete hair loss including hair on your head, underarms, pubic hair, eyebrows, and eyelashes -changes in the color of fingernails -diarrhea -loosening of the fingernails -loss of appetite -muscle or joint pain -red flush to skin -sweating This list may not describe all possible side effects. Call your doctor for medical advice about side effects. You may report side effects to FDA at 1-800-FDA-1088.  Where should I keep my medicine? This drug is given in a hospital or clinic and will not be stored at home. NOTE: This sheet is a summary. It may not cover all possible information. If you have questions about this medicine, talk to your doctor, pharmacist, or health care provider.  2012, Elsevier/Gold Standard. (10/17/2008  11:54:26 AM)  Carboplatin  What is this medicine? CARBOPLATIN (KAR boe pla tin) is a chemotherapy drug. It targets fast dividing cells, like cancer cells, and causes these cells to die. This medicine is used to treat ovarian cancer and many other cancers. This medicine may be used for other purposes; ask your health care provider or pharmacist if you have questions. What should I tell my health care provider before I take this medicine? They need to know if you have any of these conditions: -blood disorders -hearing problems -kidney disease -recent or ongoing radiation therapy -an unusual or allergic reaction to carboplatin, cisplatin, other chemotherapy, other medicines, foods, dyes, or preservatives -pregnant or trying to get pregnant -breast-feeding  How should I use this medicine? This drug is usually given as an infusion into a vein. It is administered in a hospital or clinic by a specially trained health care professional. Talk to your pediatrician regarding the use of this medicine in children. Special care may be needed. Overdosage: If you think you have taken too much of this medicine contact a poison control center or emergency room at once. NOTE: This medicine is only for you. Do not share this medicine with others. What if I miss a dose? It is important not to miss a dose. Call your doctor or health care professional if you are unable to keep an appointment. What may interact with this medicine? -medicines for seizures -medicines to increase blood counts like filgrastim, pegfilgrastim, sargramostim -some antibiotics like amikacin, gentamicin, neomycin, streptomycin, tobramycin -vaccines Talk to your doctor or health care professional before taking any of these medicines: -acetaminophen -aspirin -ibuprofen -ketoprofen -naproxen This list may not describe all possible interactions. Give your health care provider a list of all the medicines, herbs, non-prescription drugs,  or dietary supplements you use. Also tell them if you smoke, drink alcohol, or use illegal drugs. Some items may interact with your medicine. What should I watch for while using this medicine? Your condition will be monitored carefully while you are receiving this medicine. You will need important blood work done while you are taking this medicine. This drug may make you feel generally unwell. This is not uncommon, as chemotherapy can affect healthy cells as well as cancer cells. Report any side effects. Continue your course of treatment even though you feel ill unless your doctor tells you to stop. In some cases, you may be given additional medicines to help with side effects. Follow all directions for their use.  Call your doctor or health care professional for advice if you get a fever, chills or sore throat, or other symptoms of a cold or flu. Do not treat yourself. This drug decreases your body's ability to fight infections. Try to avoid being around people who are sick. This medicine may increase your risk to bruise or bleed. Call your doctor or health care professional if you notice any unusual bleeding. Be careful brushing and flossing your teeth or using a toothpick because you may get an infection or bleed more easily. If you have any dental work done, tell your dentist you are receiving this medicine. Avoid taking products that contain aspirin, acetaminophen, ibuprofen,  naproxen, or ketoprofen unless instructed by your doctor. These medicines may hide a fever. Do not become pregnant while taking this medicine. Women should inform their doctor if they wish to become pregnant or think they might be pregnant. There is a potential for serious side effects to an unborn child. Talk to your health care professional or pharmacist for more information. Do not breast-feed an infant while taking this medicine. What side effects may I notice from receiving this medicine?  Side effects that you should  report to your doctor or health care professional as soon as possible: -allergic reactions like skin rash, itching or hives, swelling of the face, lips, or tongue -signs of infection - fever or chills, cough, sore throat, pain or difficulty passing urine -signs of decreased platelets or bleeding - bruising, pinpoint red spots on the skin, black, tarry stools, nosebleeds -signs of decreased red blood cells - unusually weak or tired, fainting spells, lightheadedness -breathing problems -changes in hearing -changes in vision -chest pain -high blood pressure -low blood counts - This drug may decrease the number of white blood cells, red blood cells and platelets. You may be at increased risk for infections and bleeding. -nausea and vomiting -pain, swelling, redness or irritation at the injection site -pain, tingling, numbness in the hands or feet -problems with balance, talking, walking -trouble passing urine or change in the amount of urine  Side effects that usually do not require medical attention (report to your doctor or health care professional if they continue or are bothersome): -hair loss -loss of appetite -metallic taste in the mouth or changes in taste This list may not describe all possible side effects. Call your doctor for medical advice about side effects. You may report side effects to FDA at 1-800-FDA-1088. Where should I keep my medicine? This drug is given in a hospital or clinic and will not be stored at home. NOTE: This sheet is a summary. It may not cover all possible information. If you have questions about this medicine, talk to your doctor, pharmacist, or health care provider.  2012, Elsevier/Gold Standard. (02/09/2008 2:38:05 PM)

## 2012-06-16 NOTE — Progress Notes (Signed)
   [facility]    OFFICE PROGRESS NOTE   INTERVAL HISTORY:   He returns as scheduled. He continues to tolerate a diet. The anterior chest pain has improved with hydrocodone.  He underwent an endoscopic ultrasound by Dr. Christella Hartigan on 06/11/2012. A circumferential partially obstructing mass was noted at the GE junction. The ultrasound confirmed a 9 mm deep, 4 cm long mass in the distal esophagus that passes into and through the muscularis propria (UT 3), and there were 3 small round hypoechoic lymph nodes at the gastrohepatic ligament and paraesophageal areas that were suspicious for malignant involvement (U N1) the  Objective:  Vital signs in last 24 hours:  Blood pressure 110/77, pulse 75, temperature 98.6 F (37 C), temperature source Oral, height 5\' 2"  (1.575 m), weight 126 lb 12.8 oz (57.516 kg).  Resp: Lungs clear bilaterally Cardio: Regular rate and rhythm GI: No hepatomegaly, nontender Vascular: No leg edema   Lab Results:  Creatinine 1.03, albumin 4.3, bilirubin 0.6   Medications: I have reviewed the patient's current medications.  Assessment/Plan: 1.Distal esophagus mass-status post an endoscopic biopsy on 05/28/2012 with the pathology confirming invasive squamous cell carcinoma -staging CT scans of the chest, abdomen, and pelvis on 06/02/2012-negative for evidence of metastatic disease             -endoscopic ultrasound on 06/11/2012 confirmed a uT3,uN1 at 40 cm from the incisors  2. Solid/liquid dysphasia secondary to the obstructing esophagus mass   3. Odynophagia/subxiphoid pain secondary to the esophagus mass -improved with hydrocodone elixir    Disposition:  He has been diagnosed with clinical stage III esophagus cancer. He began radiation yesterday and will begin weekly Taxol/carboplatin chemotherapy today. He has attended chemotherapy teaching class.  We will monitor his clinical status and weight closely throughout the course of combined  therapy. Mr. Hausen will return for chemotherapy on 06/23/2012. He is scheduled for an office visit on 06/30/2012.   Thornton Papas, MD  06/16/2012  11:07 AM

## 2012-06-16 NOTE — Telephone Encounter (Signed)
Called Dr. Christella Hartigan office ,  GI, needs to fax form to make appt waiting for form

## 2012-06-16 NOTE — Progress Notes (Signed)
Out-patient Oncology Nutrition Follow up Note  Patient and daughter present with [place] interpreter. Patient reported his appetite has been good. He reported he has been eating a lot of rice soup. He reported he is drinking 2 Ensure Clear nutrition supplements daily. His weight is down 4 lb from his last visit on 06/04/2012 to 126 lb..   Nutrition Diagnosis:  Food and nutrition related knowledge deficit. Unintentional weight loss r/t diagnosis of cancer and related treatment symptoms a/e/b patient with 4 lb weight loss over 2 weeks.   Intervention:  I have encouraged the patient to increase PO intake and consume small meals throughout the day. I have also encouraged the patient to continue to drink 2 Ensure clear nutrition supplements daily. If weight continues to trend down, recommend patient increase to 3 supplements daily. The patient was without any further nutrition related questions. Patient has RD contact information.   Next Visit: Will send scheduler patient information to schedule patient a nutrition appointment at next chem infusion.   RD available for nutrition needs.   Iven Finn, MS, RD, LDN [phone]

## 2012-06-16 NOTE — Telephone Encounter (Signed)
Gave pt appt for month of August 2013 lab, ML and chemo

## 2012-06-16 NOTE — Telephone Encounter (Signed)
Per staff message I have adjusted the treatment appt on 8/13. JMW

## 2012-06-17 ENCOUNTER — Ambulatory Visit
Admission: RE | Admit: 2012-06-17 | Discharge: 2012-06-17 | Disposition: A | Discharge status: Home / Self Care | Payer: PRIVATE HEALTH INSURANCE | Source: Ambulatory Visit | Attending: Radiation Oncology | Admitting: Radiation Oncology

## 2012-06-17 ENCOUNTER — Telehealth: Payer: Self-pay | Admitting: *Deleted

## 2012-06-17 NOTE — Telephone Encounter (Signed)
Called patient and spoke with daughter Francetta Found.  Francetta Found says he is doing well.  Denies n/v or any side effects.  Encouraged to have him drink 64 oz daily and to call if any changes.

## 2012-06-17 NOTE — Telephone Encounter (Signed)
Message copied by Augusto Garbe on Wed Jun 17, 2012 12:40 PM ------      Message from: Tiburones, [place] P      Created: Tue Jun 16, 2012  4:55 PM      Regarding: chemo follow-up call      Contact: [phone]       1st Taxol/Carbo Dr. Truett Perna

## 2012-06-18 ENCOUNTER — Ambulatory Visit
Admission: RE | Admit: 2012-06-18 | Discharge: 2012-06-18 | Disposition: A | Discharge status: Home / Self Care | Payer: PRIVATE HEALTH INSURANCE | Source: Ambulatory Visit | Attending: Radiation Oncology | Admitting: Radiation Oncology

## 2012-06-19 ENCOUNTER — Encounter: Payer: Self-pay | Admitting: Radiation Oncology

## 2012-06-19 ENCOUNTER — Ambulatory Visit
Admission: RE | Admit: 2012-06-19 | Discharge: 2012-06-19 | Disposition: A | Discharge status: Home / Self Care | Payer: PRIVATE HEALTH INSURANCE | Source: Ambulatory Visit | Attending: Radiation Oncology | Admitting: Radiation Oncology

## 2012-06-19 VITALS — BP 119/80 | HR 94 | Temp 97.9°F | Wt 126.4 lb

## 2012-06-19 DIAGNOSIS — C155 Malignant neoplasm of lower third of esophagus: Secondary | ICD-10-CM

## 2012-06-19 MED ORDER — BIAFINE EX EMUL
CUTANEOUS | Status: DC | PRN
  Administered 2012-06-19: 19:00:00 via TOPICAL

## 2012-06-19 MED ORDER — ZOLPIDEM TARTRATE 10 MG PO TABS: 10.0000 mg | ORAL_TABLET | Freq: Every evening | ORAL | Status: DC | PRN

## 2012-06-19 NOTE — Progress Notes (Signed)
   Department of Radiation Oncology  Phone:  [phone] Fax:        [phone]  Weekly Treatment Note    Name: Bruce [name] Date: 06/19/2012 MRN: [id number] DOB: 11/01/1961   Current dose: 9 Gy  Current fraction: 5   MEDICATIONS: Current Outpatient Prescriptions  Medication Sig Dispense Refill  . CARAFATE 1 GM/10ML suspension 1 g 4 (four) times daily -  with meals and at bedtime.       Marland Kitchen dexamethasone (DECADRON) 4 MG tablet       . HYDROcodone-acetaminophen (LORTAB) 7.5-500 MG/15ML solution Take 15 mLs by mouth every 6 (six) hours as needed for pain.  300 mL  0  . omeprazole (PRILOSEC) 40 MG capsule Take 40 mg by mouth daily.       . prochlorperazine (COMPAZINE) 10 MG tablet Take 1 tablet (10 mg total) by mouth every 6 (six) hours as needed.  30 tablet  0  . zolpidem (AMBIEN) 10 MG tablet Take 1 tablet (10 mg total) by mouth at bedtime as needed for sleep.  30 tablet  0     ALLERGIES: Review of patient's allergies indicates no known allergies.   LABORATORY DATA:  Lab Results  Component Value Date   WBC 7.7 03/12/2012   HGB 15.7 03/12/2012   HCT 46.6 03/12/2012   MCV 88.5 03/12/2012   Lab Results  Component Value Date   NA 137 06/16/2012   K 4.0 06/16/2012   CL 103 06/16/2012   CO2 24 06/16/2012   Lab Results  Component Value Date   ALT 17 06/16/2012   AST 19 06/16/2012   ALKPHOS 69 06/16/2012   BILITOT 0.6 06/16/2012     NARRATIVE: Bruce [name] was seen today for weekly treatment management. The chart was checked and the patient's films were reviewed. The patient is doing well overall with his treatment. He does complain of insomnia. Gas some irritation of the esophagus when he drinks hot liquids. Occasional checkup.  PHYSICAL EXAMINATION: weight is 126 lb 6.4 oz (57.335 kg). His temperature is 97.9 F (36.6 C). His blood pressure is 119/80 and his pulse is 94.        ASSESSMENT: The patient is doing satisfactorily with treatment.  PLAN: We will continue with  the patient's radiation treatment as planned. I've given him a prescription for Ambien.

## 2012-06-19 NOTE — Progress Notes (Addendum)
5 fractions to esophagus today.  Interpreter present today.   C/o difficulty sleeping for the past 2 months.  Denies any esophageal pain but states when he drinks hot liquids it burns his "esophagus".  Experiencing hiccups for at least 2 hours after eating on Wednesday.   Post simulation education regarding schedule, pain management and using Carafate and Omeprazole, management of esophagitis,skin management, pain and appetite management. Family member present and speaks [place] but interpreter leading the translation.

## 2012-06-22 ENCOUNTER — Ambulatory Visit
Admission: RE | Admit: 2012-06-22 | Discharge: 2012-06-22 | Disposition: A | Discharge status: Home / Self Care | Payer: PRIVATE HEALTH INSURANCE | Source: Ambulatory Visit | Attending: Radiation Oncology | Admitting: Radiation Oncology

## 2012-06-23 ENCOUNTER — Ambulatory Visit (HOSPITAL_BASED_OUTPATIENT_CLINIC_OR_DEPARTMENT_OTHER): Payer: PRIVATE HEALTH INSURANCE

## 2012-06-23 ENCOUNTER — Other Ambulatory Visit: Payer: Self-pay | Admitting: Oncology

## 2012-06-23 ENCOUNTER — Telehealth: Payer: Self-pay | Admitting: *Deleted

## 2012-06-23 ENCOUNTER — Other Ambulatory Visit (HOSPITAL_BASED_OUTPATIENT_CLINIC_OR_DEPARTMENT_OTHER): Payer: PRIVATE HEALTH INSURANCE | Admitting: Lab

## 2012-06-23 ENCOUNTER — Ambulatory Visit
Admission: RE | Admit: 2012-06-23 | Discharge: 2012-06-23 | Disposition: A | Discharge status: Home / Self Care | Payer: PRIVATE HEALTH INSURANCE | Source: Ambulatory Visit | Attending: Radiation Oncology | Admitting: Radiation Oncology

## 2012-06-23 VITALS — BP 116/75 | HR 83 | Temp 98.5°F | Resp 16

## 2012-06-23 DIAGNOSIS — C159 Malignant neoplasm of esophagus, unspecified: Secondary | ICD-10-CM

## 2012-06-23 DIAGNOSIS — C155 Malignant neoplasm of lower third of esophagus: Secondary | ICD-10-CM

## 2012-06-23 DIAGNOSIS — Z5111 Encounter for antineoplastic chemotherapy: Secondary | ICD-10-CM

## 2012-06-23 LAB — CBC WITH DIFFERENTIAL/PLATELET
Basophils Absolute: 0 10*3/uL (ref 0.0–0.1)
Eosinophils Absolute: 0 10*3/uL (ref 0.0–0.5)
HGB: 14.1 g/dL (ref 13.0–17.1)
LYMPH%: 6.4 % — ABNORMAL LOW (ref 14.0–49.0)
MONO#: 0.1 10*3/uL (ref 0.1–0.9)
NEUT#: 5.3 10*3/uL (ref 1.5–6.5)
Platelets: 210 10*3/uL (ref 140–400)
RBC: 4.74 10*6/uL (ref 4.20–5.82)
RDW: 12.7 % (ref 11.0–14.6)
WBC: 5.8 10*3/uL (ref 4.0–10.3)
nRBC: 0 % (ref 0–0)

## 2012-06-23 MED ORDER — SODIUM CHLORIDE 0.9 % IV SOLN
Freq: Once | INTRAVENOUS | Status: AC
  Administered 2012-06-23: 15:00:00 via INTRAVENOUS

## 2012-06-23 MED ORDER — SODIUM CHLORIDE 0.9 % IV SOLN
192.8000 mg | Freq: Once | INTRAVENOUS | Status: AC
  Administered 2012-06-23: 190 mg via INTRAVENOUS
  Filled 2012-06-23: qty 19

## 2012-06-23 MED ORDER — FAMOTIDINE IN NACL 20-0.9 MG/50ML-% IV SOLN
20.0000 mg | Freq: Once | INTRAVENOUS | Status: AC
  Administered 2012-06-23: 20 mg via INTRAVENOUS

## 2012-06-23 MED ORDER — ONDANSETRON 16 MG/50ML IVPB (CHCC)
16.0000 mg | Freq: Once | INTRAVENOUS | Status: AC
  Administered 2012-06-23: 16 mg via INTRAVENOUS

## 2012-06-23 MED ORDER — PACLITAXEL CHEMO INJECTION 300 MG/50ML
50.0000 mg/m2 | Freq: Once | INTRAVENOUS | Status: AC
  Administered 2012-06-23: 78 mg via INTRAVENOUS
  Filled 2012-06-23: qty 13

## 2012-06-23 MED ORDER — DEXAMETHASONE SODIUM PHOSPHATE 10 MG/ML IJ SOLN
10.0000 mg | Freq: Once | INTRAMUSCULAR | Status: AC
  Administered 2012-06-23: 10 mg via INTRAVENOUS

## 2012-06-23 MED ORDER — DIPHENHYDRAMINE HCL 50 MG/ML IJ SOLN
25.0000 mg | Freq: Once | INTRAMUSCULAR | Status: AC
  Administered 2012-06-23: 25 mg via INTRAVENOUS

## 2012-06-23 NOTE — Telephone Encounter (Signed)
Spoke with patient's daughter, Francetta Found, by phone to give appointment time and date with Dr. Tyrone Sage.  Explained reasons for appointment to her.  She verbalized understanding and will relay to her father.  Will meet with patient this afternoon while here for treatment to assess for needs.

## 2012-06-23 NOTE — Patient Instructions (Addendum)
   [facility] Discharge Instructions for Patients Receiving Chemotherapy  Today you received the following chemotherapy agents Taxol/ Carboplatin  To help prevent nausea and vomiting after your treatment, we encourage you to take your nausea medication.    If you develop nausea and vomiting that is not controlled by your nausea medication, call the clinic.    BELOW ARE SYMPTOMS THAT SHOULD BE REPORTED IMMEDIATELY:  *FEVER GREATER THAN 100.5 F  *CHILLS WITH OR WITHOUT FEVER  NAUSEA AND VOMITING THAT IS NOT CONTROLLED WITH YOUR NAUSEA MEDICATION  *UNUSUAL SHORTNESS OF BREATH  *UNUSUAL BRUISING OR BLEEDING  TENDERNESS IN MOUTH AND THROAT WITH OR WITHOUT PRESENCE OF ULCERS  *URINARY PROBLEMS  *BOWEL PROBLEMS  UNUSUAL RASH Items with * indicate a potential emergency and should be followed up as soon as possible.  One of the nurses will contact you 24 hours after your treatment. Please let the nurse know about any problems that you may have experienced. Feel free to call the clinic you have any questions or concerns. The clinic phone number is [phone].

## 2012-06-24 ENCOUNTER — Ambulatory Visit: Payer: PRIVATE HEALTH INSURANCE

## 2012-06-25 ENCOUNTER — Ambulatory Visit
Admission: RE | Admit: 2012-06-25 | Discharge: 2012-06-25 | Disposition: A | Discharge status: Home / Self Care | Payer: PRIVATE HEALTH INSURANCE | Source: Ambulatory Visit | Attending: Radiation Oncology | Admitting: Radiation Oncology

## 2012-06-25 DIAGNOSIS — C155 Malignant neoplasm of lower third of esophagus: Secondary | ICD-10-CM

## 2012-06-25 NOTE — Progress Notes (Signed)
HERE TODAY FOR PUT OF ESOPHAGUS.  NO C/O TODAY.  SAYS HE IS EATING BUT SMALL AMTS SEVERAL TIMES A DAY.  SKIN LOOKS GOOD

## 2012-06-25 NOTE — Progress Notes (Signed)
   Department of Radiation Oncology  Phone:  [phone] Fax:        [phone]  Weekly Treatment Note    Name: Caio Devera Date: 06/25/2012 MRN: [id number] DOB: 02-Apr-1961   Current dose: 14.4 Gy  Current fraction: 8   MEDICATIONS: Current Outpatient Prescriptions  Medication Sig Dispense Refill  . CARAFATE 1 GM/10ML suspension 1 g 4 (four) times daily -  with meals and at bedtime.       Marland Kitchen dexamethasone (DECADRON) 4 MG tablet       . HYDROcodone-acetaminophen (LORTAB) 7.5-500 MG/15ML solution Take 15 mLs by mouth every 6 (six) hours as needed for pain.  300 mL  0  . omeprazole (PRILOSEC) 40 MG capsule Take 40 mg by mouth daily.       . prochlorperazine (COMPAZINE) 10 MG tablet Take 1 tablet (10 mg total) by mouth every 6 (six) hours as needed.  30 tablet  0  . zolpidem (AMBIEN) 10 MG tablet Take 1 tablet (10 mg total) by mouth at bedtime as needed for sleep.  30 tablet  0     ALLERGIES: Review of patient's allergies indicates no known allergies.   LABORATORY DATA:  Lab Results  Component Value Date   WBC 5.8 06/23/2012   HGB 14.1 06/23/2012   HCT 39.4 06/23/2012   MCV 83.1 06/23/2012   PLT 210 06/23/2012   Lab Results  Component Value Date   NA 137 06/16/2012   K 4.0 06/16/2012   CL 103 06/16/2012   CO2 24 06/16/2012   Lab Results  Component Value Date   ALT 17 06/16/2012   AST 19 06/16/2012   ALKPHOS 69 06/16/2012   BILITOT 0.6 06/16/2012     NARRATIVE: Willow Reczek was seen today for weekly treatment management. The chart was checked and the patient's films were reviewed. Patient is doing well. No worsening dysphagia. No esophagitis at this time. The patient is eating several, more frequent meals which are smaller at one time.  PHYSICAL EXAMINATION: vitals were not taken for this visit.     alert, in no acute distress,  ASSESSMENT: The patient is doing satisfactorily with treatment.  PLAN: We will continue with the patient's radiation treatment as planned.

## 2012-06-26 ENCOUNTER — Ambulatory Visit
Admission: RE | Admit: 2012-06-26 | Discharge: 2012-06-26 | Disposition: A | Discharge status: Home / Self Care | Payer: PRIVATE HEALTH INSURANCE | Source: Ambulatory Visit | Attending: Radiation Oncology | Admitting: Radiation Oncology

## 2012-06-28 ENCOUNTER — Other Ambulatory Visit: Payer: Self-pay | Admitting: Oncology

## 2012-06-29 ENCOUNTER — Ambulatory Visit
Admission: RE | Admit: 2012-06-29 | Discharge: 2012-06-29 | Disposition: A | Discharge status: Home / Self Care | Payer: PRIVATE HEALTH INSURANCE | Source: Ambulatory Visit | Attending: Radiation Oncology | Admitting: Radiation Oncology

## 2012-06-30 ENCOUNTER — Telehealth: Payer: Self-pay | Admitting: Oncology

## 2012-06-30 ENCOUNTER — Ambulatory Visit (HOSPITAL_BASED_OUTPATIENT_CLINIC_OR_DEPARTMENT_OTHER): Payer: PRIVATE HEALTH INSURANCE | Admitting: Nurse Practitioner

## 2012-06-30 ENCOUNTER — Other Ambulatory Visit (HOSPITAL_BASED_OUTPATIENT_CLINIC_OR_DEPARTMENT_OTHER): Payer: PRIVATE HEALTH INSURANCE | Admitting: Lab

## 2012-06-30 ENCOUNTER — Ambulatory Visit
Admission: RE | Admit: 2012-06-30 | Discharge: 2012-06-30 | Disposition: A | Discharge status: Home / Self Care | Payer: PRIVATE HEALTH INSURANCE | Source: Ambulatory Visit | Attending: Radiation Oncology | Admitting: Radiation Oncology

## 2012-06-30 ENCOUNTER — Ambulatory Visit: Payer: PRIVATE HEALTH INSURANCE | Admitting: Nutrition

## 2012-06-30 ENCOUNTER — Ambulatory Visit (HOSPITAL_BASED_OUTPATIENT_CLINIC_OR_DEPARTMENT_OTHER): Payer: PRIVATE HEALTH INSURANCE

## 2012-06-30 VITALS — BP 119/75 | HR 89 | Temp 97.7°F | Resp 20 | Ht 62.0 in | Wt 124.4 lb

## 2012-06-30 DIAGNOSIS — C155 Malignant neoplasm of lower third of esophagus: Secondary | ICD-10-CM

## 2012-06-30 DIAGNOSIS — R1310 Dysphagia, unspecified: Secondary | ICD-10-CM

## 2012-06-30 DIAGNOSIS — C159 Malignant neoplasm of esophagus, unspecified: Secondary | ICD-10-CM

## 2012-06-30 DIAGNOSIS — Z5111 Encounter for antineoplastic chemotherapy: Secondary | ICD-10-CM

## 2012-06-30 LAB — COMPREHENSIVE METABOLIC PANEL
Albumin: 3.9 g/dL (ref 3.5–5.2)
BUN: 11 mg/dL (ref 6–23)
CO2: 23 mEq/L (ref 19–32)
Calcium: 9.2 mg/dL (ref 8.4–10.5)
Chloride: 103 mEq/L (ref 96–112)
Glucose, Bld: 146 mg/dL — ABNORMAL HIGH (ref 70–99)
Potassium: 4 mEq/L (ref 3.5–5.3)

## 2012-06-30 LAB — CBC WITH DIFFERENTIAL/PLATELET
Basophils Absolute: 0 10*3/uL (ref 0.0–0.1)
HCT: 39.8 % (ref 38.4–49.9)
HGB: 14.1 g/dL (ref 13.0–17.1)
MONO#: 0.1 10*3/uL (ref 0.1–0.9)
NEUT%: 89.7 % — ABNORMAL HIGH (ref 39.0–75.0)
Platelets: 154 10*3/uL (ref 140–400)
WBC: 3.5 10*3/uL — ABNORMAL LOW (ref 4.0–10.3)
lymph#: 0.3 10*3/uL — ABNORMAL LOW (ref 0.9–3.3)

## 2012-06-30 MED ORDER — SODIUM CHLORIDE 0.9 % IV SOLN
Freq: Once | INTRAVENOUS | Status: AC
  Administered 2012-06-30: 15:00:00 via INTRAVENOUS

## 2012-06-30 MED ORDER — SODIUM CHLORIDE 0.9 % IV SOLN
192.8000 mg | Freq: Once | INTRAVENOUS | Status: AC
  Administered 2012-06-30: 190 mg via INTRAVENOUS
  Filled 2012-06-30: qty 19

## 2012-06-30 MED ORDER — FAMOTIDINE IN NACL 20-0.9 MG/50ML-% IV SOLN
20.0000 mg | Freq: Once | INTRAVENOUS | Status: AC
  Administered 2012-06-30: 20 mg via INTRAVENOUS

## 2012-06-30 MED ORDER — DIPHENHYDRAMINE HCL 50 MG/ML IJ SOLN
25.0000 mg | Freq: Once | INTRAMUSCULAR | Status: AC
  Administered 2012-06-30: 25 mg via INTRAVENOUS

## 2012-06-30 MED ORDER — ONDANSETRON 16 MG/50ML IVPB (CHCC)
16.0000 mg | Freq: Once | INTRAVENOUS | Status: AC
  Administered 2012-06-30: 16 mg via INTRAVENOUS

## 2012-06-30 MED ORDER — DEXAMETHASONE SODIUM PHOSPHATE 10 MG/ML IJ SOLN
10.0000 mg | Freq: Once | INTRAMUSCULAR | Status: AC
  Administered 2012-06-30: 10 mg via INTRAVENOUS

## 2012-06-30 MED ORDER — PACLITAXEL CHEMO INJECTION 300 MG/50ML
50.0000 mg/m2 | Freq: Once | INTRAVENOUS | Status: AC
  Administered 2012-06-30: 78 mg via INTRAVENOUS
  Filled 2012-06-30: qty 13

## 2012-06-30 NOTE — Telephone Encounter (Signed)
gve the pt his aug 2013 appt calendar

## 2012-06-30 NOTE — Patient Instructions (Signed)
[facility] Discharge Instructions for Patients Receiving Chemotherapy  Today you received the following chemotherapy agents Taxol and Carboplatin.   To help prevent nausea and vomiting after your treatment, we encourage you to take your nausea medication. Begin taking it as often as prescribed for by Dr. Truett Perna.    If you develop nausea and vomiting that is not controlled by your nausea medication, call the clinic. If it is after clinic hours your family physician or the after hours number for the clinic or go to the Emergency Department.   BELOW ARE SYMPTOMS THAT SHOULD BE REPORTED IMMEDIATELY:  *FEVER GREATER THAN 100.5 F  *CHILLS WITH OR WITHOUT FEVER  NAUSEA AND VOMITING THAT IS NOT CONTROLLED WITH YOUR NAUSEA MEDICATION  *UNUSUAL SHORTNESS OF BREATH  *UNUSUAL BRUISING OR BLEEDING  TENDERNESS IN MOUTH AND THROAT WITH OR WITHOUT PRESENCE OF ULCERS  *URINARY PROBLEMS  *BOWEL PROBLEMS  UNUSUAL RASH Items with * indicate a potential emergency and should be followed up as soon as possible.  One of the nurses will contact you 24 hours after your treatment. Please let the nurse know about any problems that you may have experienced. Feel free to call the clinic you have any questions or concerns. The clinic phone number is [phone].   I have been informed and understand all the instructions given to me. I know to contact the clinic, my physician, or go to the Emergency Department if any problems should occur. I do not have any questions at this time, but understand that I may call the clinic during office hours   should I have any questions or need assistance in obtaining follow up care.    __________________________________________  _____________  __________ Signature of Patient or Authorized Representative            Date                   Time    __________________________________________ Nurse's Signature

## 2012-06-30 NOTE — Progress Notes (Signed)
OFFICE PROGRESS NOTE  Interval history:  Bruce [name] returns as scheduled. He continues daily radiation. He began weekly Taxol/carboplatin on 06/16/2012. He completed week 2 on 06/23/2012. Minimal nausea. He notes intermittent dysphagia to solids. Mild odynophagia.   Objective: Blood pressure 119/75, pulse 89, temperature 97.7 F (36.5 C), temperature source Oral, resp. rate 20, height 5\' 2"  (1.575 m), weight 124 lb 6.4 oz (56.427 kg).  Oropharynx is without thrush or ulceration. End inspiratory rales at the right lung base. Lungs otherwise clear. Regular cardiac rhythm. Abdomen soft and nontender. No hepatomegaly. Extremities are without edema. Calves soft and nontender.  Lab Results: Lab Results  Component Value Date   WBC 3.5* 06/30/2012   HGB 14.1 06/30/2012   HCT 39.8 06/30/2012   MCV 83.1 06/30/2012   PLT 154 06/30/2012    Chemistry:    Chemistry      Component Value Date/Time   NA 137 06/16/2012 0907   K 4.0 06/16/2012 0907   CL 103 06/16/2012 0907   CO2 24 06/16/2012 0907   BUN 12 06/16/2012 0907   CREATININE 1.03 06/16/2012 0907   CREATININE 1.10 04/20/2012 1647      Component Value Date/Time   CALCIUM 9.9 06/16/2012 0907   ALKPHOS 69 06/16/2012 0907   AST 19 06/16/2012 0907   ALT 17 06/16/2012 0907   BILITOT 0.6 06/16/2012 0907       Studies/Results: Ct Chest W Contrast  06/02/2012  *RADIOLOGY REPORT*  Clinical Data:  Esophageal carcinoma with dysphagia and chest pain.  CT CHEST, ABDOMEN AND PELVIS WITH CONTRAST  Technique:  Multidetector CT imaging of the chest, abdomen and pelvis was performed following the standard protocol during bolus administration of intravenous contrast.  Contrast: OMNIPAQUE IOHEXOL 300 MG/ML  SOLN  Comparison:  Chest radiographs 05/29/2012.  CT CHEST  Findings:  There is mild distal esophageal wall thickening associated with a small hiatal hernia.  No discrete mass or adjacent enlarged lymph nodes are identified.  There are no enlarged mediastinal  or hilar lymph nodes.  The thyroid gland and vascular structures appear unremarkable.  Peripheral fibrotic changes are present in both lungs.  There is mild emphysema.  There is no suspicious pulmonary nodule or confluent airspace opacity.  There is no endobronchial lesion. There is no pleural or pericardial effusion.  No suspicious osseous findings are seen.  IMPRESSION:  1.  Mild nonspecific distal esophageal wall thickening and small hiatal hernia.  No discrete mass identified. 2.  No evidence of metastatic disease. 3.  Mild pulmonary fibrosis.  CT ABDOMEN AND PELVIS  Findings:  The gallbladder is contracted without wall thickening or surrounding inflammatory change.  There is no biliary dilatation. The liver and pancreas appear normal.  There is no adrenal mass. The kidneys and spleen appear normal.  No enlarged abdominal pelvic lymph nodes are identified.  There is a 6 mm celiac node on image 52.  There is no ascites or peritoneal nodularity.  Mild aorto iliac atherosclerosis is present. The stomach is poorly distended and there is diffuse gastric wall thickening.  No focal lesion is identified.  There is colonic redundancy.  The appendix appears normal.  The bladder, prostate gland and seminal vesicles appear normal.  There are no osseous abnormalities.  IMPRESSION:  1.  No evidence of abdominal pelvic metastatic disease. 2.  Nonspecific diffuse gastric wall thickening, possibly related to incomplete distension, although a component of gastritis suspected. 3.  No acute findings.  Original Report Authenticated By: Gerrianne Scale,  M.D.   Ct Abdomen Pelvis W Contrast  06/02/2012  *RADIOLOGY REPORT*  Clinical Data:  Esophageal carcinoma with dysphagia and chest pain.  CT CHEST, ABDOMEN AND PELVIS WITH CONTRAST  Technique:  Multidetector CT imaging of the chest, abdomen and pelvis was performed following the standard protocol during bolus administration of intravenous contrast.  Contrast: OMNIPAQUE  IOHEXOL 300 MG/ML  SOLN  Comparison:  Chest radiographs 05/29/2012.  CT CHEST  Findings:  There is mild distal esophageal wall thickening associated with a small hiatal hernia.  No discrete mass or adjacent enlarged lymph nodes are identified.  There are no enlarged mediastinal or hilar lymph nodes.  The thyroid gland and vascular structures appear unremarkable.  Peripheral fibrotic changes are present in both lungs.  There is mild emphysema.  There is no suspicious pulmonary nodule or confluent airspace opacity.  There is no endobronchial lesion. There is no pleural or pericardial effusion.  No suspicious osseous findings are seen.  IMPRESSION:  1.  Mild nonspecific distal esophageal wall thickening and small hiatal hernia.  No discrete mass identified. 2.  No evidence of metastatic disease. 3.  Mild pulmonary fibrosis.  CT ABDOMEN AND PELVIS  Findings:  The gallbladder is contracted without wall thickening or surrounding inflammatory change.  There is no biliary dilatation. The liver and pancreas appear normal.  There is no adrenal mass. The kidneys and spleen appear normal.  No enlarged abdominal pelvic lymph nodes are identified.  There is a 6 mm celiac node on image 52.  There is no ascites or peritoneal nodularity.  Mild aorto iliac atherosclerosis is present. The stomach is poorly distended and there is diffuse gastric wall thickening.  No focal lesion is identified.  There is colonic redundancy.  The appendix appears normal.  The bladder, prostate gland and seminal vesicles appear normal.  There are no osseous abnormalities.  IMPRESSION:  1.  No evidence of abdominal pelvic metastatic disease. 2.  Nonspecific diffuse gastric wall thickening, possibly related to incomplete distension, although a component of gastritis suspected. 3.  No acute findings.  Original Report Authenticated By: Gerrianne Scale, M.D.   Nm Pet Image Initial (pi) Skull Base To Thigh  06/11/2012  *RADIOLOGY REPORT*  Clinical Data:  Initial treatment strategy for esophageal cancer.  NUCLEAR MEDICINE PET SKULL BASE TO THIGH  Fasting Blood Glucose:  86  Technique:  17.8 mCi F-18 FDG was injected intravenously. CT data was obtained and used for attenuation correction and anatomic localization only.  (This was not acquired as a diagnostic CT examination.) Additional exam technical data entered on technologist worksheet.  Comparison:  CT chest, abdomen and pelvis 06/02/2012.  Findings:  Neck: No hypermetabolic lymph nodes in the neck.  Chest: There is some circumferential thickening of the distal esophagus which demonstrates profound hypermetabolic activity (SUVmax = 24.5).No hypermetabolic mediastinal or hilar nodes.  No suspicious pulmonary nodules on the CT scan. Paraseptal emphysematous changes in the lungs.  Heart size is normal.  Abdomen/Pelvis:  No abnormal hypermetabolic activity within the liver, pancreas, adrenal glands, or spleen.  No hypermetabolic lymph nodes in the abdomen or pelvis.  The unenhanced appearance of the liver, gallbladder, pancreas, spleen, bilateral adrenal glands and bilateral kidneys is unremarkable.  There is atherosclerosis in the abdominal and pelvic vasculature, without evidence of aneurysm. No ascites or pneumoperitoneum and no pathologic distension of bowel.  Normal appendix.  Prostate urinary bladder are unremarkable in appearance.  Skelton:  No focal hypermetabolic activity to suggest skeletal metastasis.  IMPRESSION:  1.  Circumferential thickening of the distal esophagus demonstrates hypermetabolic activity (SUVmax = 24.5)compatible with the reported esophageal neoplasm.  No findings to suggest metastatic disease in the neck, chest, abdomen or pelvis on today's examination. 2.  Additional incidental findings, as above.  Original Report Authenticated By: Florencia Reasons, M.D.    Medications: I have reviewed the patient's current medications.  Assessment/Plan:  1. Distal esophagus mass. Status post  endoscopic biopsy on 05/28/2012 with pathology confirming invasive squamous cell carcinoma. Staging CT scans of the chest, abdomen and pelvis on 06/02/2012 negative for evidence of metastatic disease. Endoscopic ultrasound 06/11/2012 confirmed a uT3 uN1 tumor at 40 cm from the incisors. He began radiation 06/15/2012. He began weekly Taxol/carboplatin chemotherapy 06/16/2012. 2. Solid/liquid dysphagia secondary to the obstructing esophagus mass. 3. Odynophagia/subxiphoid pain secondary to the esophagus mass. Improved with hydrocodone elixir.  Disposition-Mr. Dumlao appears stable. He continues radiation. Plan to proceed with week 3 Taxol/carboplatin today as scheduled. He will return for the week 4 treatment on 07/07/2012. He will be seen in followup on 07/14/2012. He will contact the office in the interim with any problems.  Plan reviewed with Dr. Truett Perna.  Lonna Cobb ANP/GNP-BC

## 2012-06-30 NOTE — Progress Notes (Signed)
The patient presents to chemotherapy with interpreter and his daughter. He continues to eat a lot of rice soup with chicken and fish in it.  He is drinking 1-2 Ensure Clear nutrition supplements daily.  His weight is down to 124.4 pounds documented on August 13th from 130.5 pounds documented July 15.  He denies difficulty swallowing.  He enjoys Ensure Clear but not the milk-based supplement.  NUTRITION DIAGNOSIS:  Food and nutrition related knowledge deficit and unintentional weight loss both continue.  INTERVENTION:  I have encouraged the patient to continue higher calorie, higher protein foods throughout the day.  I have asked him if he can increase Ensure Clear to 3 times daily between meals.  The patient is agreeable.  MONITORING/EVALUATION (GOALS):  Patient will tolerate increased oral intake to minimize further weight loss.  NEXT VISIT:  There is no followup scheduled.  The patient has contact number for questions or concerns.   ______________________________ Zenovia Jarred, RD, CSO, LDN Clinical Nutrition Specialist BN/MEDQ  D:  06/30/2012  T:  06/30/2012  Job:  [id number]

## 2012-07-01 ENCOUNTER — Ambulatory Visit
Admission: RE | Admit: 2012-07-01 | Discharge: 2012-07-01 | Disposition: A | Discharge status: Home / Self Care | Payer: PRIVATE HEALTH INSURANCE | Source: Ambulatory Visit | Attending: Radiation Oncology | Admitting: Radiation Oncology

## 2012-07-02 ENCOUNTER — Encounter: Payer: Self-pay | Admitting: Cardiothoracic Surgery

## 2012-07-02 ENCOUNTER — Institutional Professional Consult (permissible substitution) (INDEPENDENT_AMBULATORY_CARE_PROVIDER_SITE_OTHER): Payer: PRIVATE HEALTH INSURANCE | Admitting: Cardiothoracic Surgery

## 2012-07-02 ENCOUNTER — Other Ambulatory Visit: Payer: Self-pay | Admitting: *Deleted

## 2012-07-02 ENCOUNTER — Ambulatory Visit
Admission: RE | Admit: 2012-07-02 | Discharge: 2012-07-02 | Disposition: A | Discharge status: Home / Self Care | Payer: PRIVATE HEALTH INSURANCE | Source: Ambulatory Visit | Attending: Radiation Oncology | Admitting: Radiation Oncology

## 2012-07-02 VITALS — BP 85/53 | HR 72 | Resp 18 | Ht 62.0 in | Wt 126.0 lb

## 2012-07-02 DIAGNOSIS — C159 Malignant neoplasm of esophagus, unspecified: Secondary | ICD-10-CM

## 2012-07-02 DIAGNOSIS — C155 Malignant neoplasm of lower third of esophagus: Secondary | ICD-10-CM

## 2012-07-02 MED ORDER — OMEPRAZOLE 40 MG PO CPDR: 40.0000 mg | DELAYED_RELEASE_CAPSULE | Freq: Every day | ORAL | Status: DC

## 2012-07-02 NOTE — Progress Notes (Signed)
[address]            [place] [address]          [phone]      Bruce [name] [facility] Medical Record #[id number] Date of Birth: Apr 03, 1961  Referring: Ladene Artist, MD & Dr Mitzi Hansen Primary Care: No primary provider on file.  Chief Complaint:    Chief Complaint  Patient presents with  . Esophageal Cancer    Referral from Dr Truett Perna for surgical eval on esophagel cancer, PET Scan 06/11/12    History of Present Illness:    Patient is a 51 year old didn't ease native who presents with a three-month history of episodic vomiting and difficulty swallowing and pain on swallowing. 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 He's been started on chemotherapy and radiation treatment prior to consideration of surgical resection. At this point he seems to be tolerating treatment well he notes his swallowing has improved some.      Current Activity/ Functional Status: Patient is independent with mobility/ambulation, transfers, ADL's, IADL's.   Past Medical History  Diagnosis Date  . Esophagitis   . Mass of esophagus 05/28/2012    BX'D DISTAL ESOPHAGUS,PENDING  . Dysphasia     solid and liquid  . Neuropathy     right hand numbness 1 year 2012  . Cancer 05/28/12    bx=esophagus=squamous cell carcinoma    Past Surgical History  Procedure Date  . Esophagogastroduodenoscopy 05/28/12    with biopsy mass distal esophagus ending ge junction  =invasiver squamous cell ca  . Eus 06/11/2012    Procedure: UPPER ENDOSCOPIC ULTRASOUND (EUS) RADIAL;  Surgeon: Rachael Fee, MD;  Location: [facility];  Service: Endoscopy;  Laterality: N/A;    Family History  Problem Relation Age of Onset  . Breast cancer Sister     History   Social History  . Marital Status: Married    Spouse Name: N/A    Number of Children: N/A  . Years of Education: N/A    Occupational History  . Not on file.   Social History Main Topics  . Smoking status: Former Smoker -- 0.4 packs/day    Types: Cigarettes    Quit date: 06/19/2011  . Smokeless tobacco: Former Neurosurgeon    Quit date: 06/03/2007  . Alcohol Use: 8.4 oz/week    14 Cans of beer per week  . Drug Use: No  . Sexually Active:    Patient is employed as a Psychologist, occupational   History  Smoking status  . Former Smoker -- 0.4 packs/day  . Types: Cigarettes  . Quit date: 06/19/2011  Smokeless tobacco  . Former Neurosurgeon  . Quit date: 06/03/2007    History  Alcohol Use  . 8.4 oz/week  . 14 Cans of beer per week     No Known Allergies  Current Outpatient Prescriptions  Medication Sig Dispense Refill  . CARAFATE 1 GM/10ML suspension 1 g 4 (four) times daily -  with meals and at bedtime.       Marland Kitchen dexamethasone (DECADRON) 4 MG tablet Take 4 mg by mouth daily with breakfast. At bedtime the night before fist chemo and at 6 am morning of first chemo the d/c      . HYDROcodone-acetaminophen (LORTAB) 7.5-500 MG/15ML solution Take 15 mLs by mouth every  6 (six) hours as needed for pain.  300 mL  0  . prochlorperazine (COMPAZINE) 10 MG tablet Take 1 tablet (10 mg total) by mouth every 6 (six) hours as needed.  30 tablet  0  . zolpidem (AMBIEN) 10 MG tablet Take 1 tablet (10 mg total) by mouth at bedtime as needed for sleep.  30 tablet  0  . omeprazole (PRILOSEC) 40 MG capsule Take 1 capsule (40 mg total) by mouth daily.  30 capsule  5       Review of Systems:     Cardiac Review of Systems: Y or N  Chest Pain [    ]  Resting SOB [   ] Exertional SOB  [  ]  Orthopnea [  ]   Pedal Edema [   ]    Palpitations [  ] Syncope  [  ]   Presyncope [   ]  General Review of Systems: [Y] = yes [  ]=no Constitional: recent weight change [  ]; anorexia [  ]; fatigue [  ]; nausea [  ]; night sweats [  ]; fever [  ]; or chills [  ];                                                                                                                                           Dental: poor dentition[ n ];   Eye : blurred vision [  ]; diplopia [   ]; vision changes [  ];  Amaurosis fugax[  ]; Resp: cough [  ];  wheezing[  ];  hemoptysis[  ]; shortness of breath[  ]; paroxysmal nocturnal dyspnea[  ]; dyspnea on exertion[  ]; or orthopnea[  ];  GI:  gallstones[ n ], vomiting[ y ];  dysphagia[ y ]; melena[ n ];  hematochezia [ n ]; heartburn[  ];   Hx of  Colonoscopy[  ]; GU: kidney stones [  ]; hematuria[n  ];   dysuria [  ];  nocturia[  ];  history of     obstruction [  ];             Skin: rash, swelling[  ];, hair loss[  ];  peripheral edema[  ];  or itching[  ]; Musculosketetal: myalgias[ n ];  joint swelling[  ];  joint erythema[  ];  joint pain[  ];  back pain[n  ];  Heme/Lymph: bruising[  ];  bleeding[  ];  anemia[  ];  Neuro: TIA[  n];  headaches[n  ];  stroke[  ];  vertigo[  ];  seizures[  ];   paresthesias[  ];  difficulty walking[  ];  Psych:depression[  ]; anxiety[  ];  Endocrine: diabetes[ n ];  thyroid dysfunction[n  ];  Immunizations: Flu [n]; Pneumococcal[n  ];  Other:  Physical Exam: BP 85/53  Pulse 72  Resp 18  Ht 5\' 2"  (1.575 m)  Wt 126 lb (57.153 kg)  BMI 23.05 kg/m2  SpO2 99%  General appearance: alert, cooperative and appears stated age Neurologic: intact Heart: regular rate and rhythm, S1, S2 normal, no murmur, click, rub or gallop and normal apical impulse Lungs: clear to auscultation bilaterally and normal percussion bilaterally Abdomen: soft, non-tender; bowel sounds normal; no masses,  no organomegaly Extremities: extremities normal, atraumatic, no cyanosis or edema and Homans sign is negative, no sign of DVT I do not appreciate cervical or supraclavicular adenopathy, there is no axillary adenopathy   Diagnostic Studies & Laboratory data:     Recent Radiology Findings:  Nm Pet Image Initial (pi) Skull Base To Thigh  06/11/2012  *RADIOLOGY REPORT*  Clinical Data: Initial treatment strategy  for esophageal cancer.  NUCLEAR MEDICINE PET SKULL BASE TO THIGH  Fasting Blood Glucose:  86  Technique:  17.8 mCi F-18 FDG was injected intravenously. CT data was obtained and used for attenuation correction and anatomic localization only.  (This was not acquired as a diagnostic CT examination.) Additional exam technical data entered on technologist worksheet.  Comparison:  CT chest, abdomen and pelvis 06/02/2012.  Findings:  Neck: No hypermetabolic lymph nodes in the neck.  Chest: There is some circumferential thickening of the distal esophagus which demonstrates profound hypermetabolic activity (SUVmax = 24.5).No hypermetabolic mediastinal or hilar nodes.  No suspicious pulmonary nodules on the CT scan. Paraseptal emphysematous changes in the lungs.  Heart size is normal.  Abdomen/Pelvis:  No abnormal hypermetabolic activity within the liver, pancreas, adrenal glands, or spleen.  No hypermetabolic lymph nodes in the abdomen or pelvis.  The unenhanced appearance of the liver, gallbladder, pancreas, spleen, bilateral adrenal glands and bilateral kidneys is unremarkable.  There is atherosclerosis in the abdominal and pelvic vasculature, without evidence of aneurysm. No ascites or pneumoperitoneum and no pathologic distension of bowel.  Normal appendix.  Prostate urinary bladder are unremarkable in appearance.  Skel[place]:  No focal hypermetabolic activity to suggest skeletal metastasis.  IMPRESSION: 1.  Circumferential thickening of the distal esophagus demonstrates hypermetabolic activity (SUVmax = 24.5)compatible with the reported esophageal neoplasm.  No findings to suggest metastatic disease in the neck, chest, abdomen or pelvis on today's examination. 2.  Additional incidental findings, as above.  Original Report Authenticated By: Florencia Reasons, M.D.    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 He appears to be a reasonable candidate for subsequent esophagectomy probable transhiatal. The planned surgical treatment has been reviewed with the patient and his daughter through an interpreter his questions have been answered. We'll plan to see him back in mid September at the completion of this radiation and chemotherapy treatment with anticipation of proceeding with resection in late September early October.       Delight Ovens MD  Beeper [phone] Office [phone] 07/02/2012 4:03 PM

## 2012-07-03 ENCOUNTER — Ambulatory Visit
Admission: RE | Admit: 2012-07-03 | Discharge: 2012-07-03 | Disposition: A | Discharge status: Home / Self Care | Payer: PRIVATE HEALTH INSURANCE | Source: Ambulatory Visit | Attending: Radiation Oncology | Admitting: Radiation Oncology

## 2012-07-03 ENCOUNTER — Encounter: Payer: Self-pay | Admitting: Radiation Oncology

## 2012-07-03 ENCOUNTER — Telehealth: Payer: Self-pay | Admitting: Radiation Oncology

## 2012-07-03 VITALS — Temp 98.1°F | Resp 18 | Wt 122.6 lb

## 2012-07-03 DIAGNOSIS — C155 Malignant neoplasm of lower third of esophagus: Secondary | ICD-10-CM

## 2012-07-03 MED ORDER — SUCRALFATE 1 GM/10ML PO SUSP: 1.0000 g | Freq: Three times a day (TID) | ORAL | Status: DC

## 2012-07-03 MED ORDER — OMEPRAZOLE 40 MG PO CPDR: 40.0000 mg | DELAYED_RELEASE_CAPSULE | Freq: Every day | ORAL | Status: DC

## 2012-07-03 NOTE — Telephone Encounter (Signed)
Phoned patient's daughter as promised. Instructed her that Dr. Kathrynn Running is aware of the need for a refill on her father's omeprazole script and that he plans to see him for PUT following treatment today. Also, instructed her that the need for this script would be addressed during the PUT visit today. She verbalized understanding.

## 2012-07-03 NOTE — Progress Notes (Signed)
  Radiation Oncology         [phone] ________________________________  Name: Bruce [name] MRN: [id number]  Date: 07/03/2012  DOB: 1961/06/26  Weekly Radiation Therapy Management  Current Dose: 25.2 Gy     Planned Dose:  54 Gy  Narrative . . . . . . . . The patient presents for routine under treatment assessment.                                            The patient has some esophageal discomfort and requires a refill for his Prilosec.                                 Set-up films were reviewed.                                 The chart was checked. Physical Findings. . . Weight essentially stable.  No significant changes. Impression . . . . . . . The patient is  tolerating radiation. Plan . . . . . . . . . . . . Continue treatment as planned.  Refilled Prilosec and carafate.  ________________________________  Artist Pais. Kathrynn Running, M.D.

## 2012-07-03 NOTE — Progress Notes (Signed)
Patient presents to the clinic today accompanied by his daughter and the interpretor for a PUT with Dr. Kathrynn Running. Patient is alert and oriented to person, place, and time. No distress noted. Steady gait noted. Pleasant affect noted. Patient reports occasional stabbing pain in the center of his chest "where the cancer is." patient reports taking lortab elixir once per day for the pain. Patient denies using carafate but, states that he was at one point. Patient denies nausea, vomiting, headache, and dizziness. Patient has lost four pounds this week. Patient reports he eats very little because he feels full after only eating a few bites of food. Patient reports pain with swallowing. Patient reports that he has to chase food with water to "get it to go down." No thrush noted. Reported all findings to Dr. Kathrynn Running.  Patient requesting prilosec refill.

## 2012-07-06 ENCOUNTER — Ambulatory Visit
Admission: RE | Admit: 2012-07-06 | Discharge: 2012-07-06 | Disposition: A | Discharge status: Home / Self Care | Payer: PRIVATE HEALTH INSURANCE | Source: Ambulatory Visit | Attending: Radiation Oncology | Admitting: Radiation Oncology

## 2012-07-06 ENCOUNTER — Telehealth: Payer: Self-pay | Admitting: *Deleted

## 2012-07-06 ENCOUNTER — Other Ambulatory Visit: Payer: Self-pay | Admitting: Oncology

## 2012-07-06 NOTE — Telephone Encounter (Signed)
Called patient  Home and spoke with wife letting them know husbands Prilosec and Carafate  Has been called into his pharmacy at [facility] by Dr.Manning, patient wife thanaked this MD and staff for the call 9:48 AM

## 2012-07-07 ENCOUNTER — Other Ambulatory Visit (HOSPITAL_BASED_OUTPATIENT_CLINIC_OR_DEPARTMENT_OTHER): Payer: PRIVATE HEALTH INSURANCE | Admitting: Lab

## 2012-07-07 ENCOUNTER — Ambulatory Visit
Admission: RE | Admit: 2012-07-07 | Discharge: 2012-07-07 | Disposition: A | Discharge status: Home / Self Care | Payer: PRIVATE HEALTH INSURANCE | Source: Ambulatory Visit | Attending: Radiation Oncology | Admitting: Radiation Oncology

## 2012-07-07 ENCOUNTER — Ambulatory Visit (HOSPITAL_BASED_OUTPATIENT_CLINIC_OR_DEPARTMENT_OTHER): Payer: PRIVATE HEALTH INSURANCE

## 2012-07-07 VITALS — BP 104/63 | HR 86 | Temp 99.4°F | Resp 16

## 2012-07-07 DIAGNOSIS — C155 Malignant neoplasm of lower third of esophagus: Secondary | ICD-10-CM

## 2012-07-07 DIAGNOSIS — C159 Malignant neoplasm of esophagus, unspecified: Secondary | ICD-10-CM

## 2012-07-07 DIAGNOSIS — Z5111 Encounter for antineoplastic chemotherapy: Secondary | ICD-10-CM

## 2012-07-07 LAB — CBC WITH DIFFERENTIAL/PLATELET
Basophils Absolute: 0 10*3/uL (ref 0.0–0.1)
EOS%: 1.1 % (ref 0.0–7.0)
Eosinophils Absolute: 0 10*3/uL (ref 0.0–0.5)
HGB: 12.6 g/dL — ABNORMAL LOW (ref 13.0–17.1)
MCH: 28.8 pg (ref 27.2–33.4)
NEUT#: 2.1 10*3/uL (ref 1.5–6.5)
RDW: 12.8 % (ref 11.0–14.6)
WBC: 2.7 10*3/uL — ABNORMAL LOW (ref 4.0–10.3)
lymph#: 0.2 10*3/uL — ABNORMAL LOW (ref 0.9–3.3)

## 2012-07-07 MED ORDER — DIPHENHYDRAMINE HCL 50 MG/ML IJ SOLN
25.0000 mg | Freq: Once | INTRAMUSCULAR | Status: AC
  Administered 2012-07-07: 25 mg via INTRAVENOUS

## 2012-07-07 MED ORDER — ONDANSETRON 16 MG/50ML IVPB (CHCC)
16.0000 mg | Freq: Once | INTRAVENOUS | Status: AC
  Administered 2012-07-07: 16 mg via INTRAVENOUS

## 2012-07-07 MED ORDER — SODIUM CHLORIDE 0.9 % IV SOLN
192.8000 mg | Freq: Once | INTRAVENOUS | Status: AC
  Administered 2012-07-07: 190 mg via INTRAVENOUS
  Filled 2012-07-07: qty 19

## 2012-07-07 MED ORDER — DEXAMETHASONE SODIUM PHOSPHATE 10 MG/ML IJ SOLN
10.0000 mg | Freq: Once | INTRAMUSCULAR | Status: AC
  Administered 2012-07-07: 10 mg via INTRAVENOUS

## 2012-07-07 MED ORDER — PACLITAXEL CHEMO INJECTION 300 MG/50ML
50.0000 mg/m2 | Freq: Once | INTRAVENOUS | Status: AC
  Administered 2012-07-07: 78 mg via INTRAVENOUS
  Filled 2012-07-07: qty 13

## 2012-07-07 MED ORDER — FAMOTIDINE IN NACL 20-0.9 MG/50ML-% IV SOLN
20.0000 mg | Freq: Once | INTRAVENOUS | Status: AC
  Administered 2012-07-07: 20 mg via INTRAVENOUS

## 2012-07-07 MED ORDER — SODIUM CHLORIDE 0.9 % IV SOLN
Freq: Once | INTRAVENOUS | Status: AC
  Administered 2012-07-07: 15:00:00 via INTRAVENOUS

## 2012-07-07 NOTE — Progress Notes (Signed)
BP 83/58. S/w Dr Truett Perna, give 500cc infusion NS and continue with treatment. Dr Truett Perna also aware of WBC 2.7.

## 2012-07-07 NOTE — Patient Instructions (Signed)
[facility] Discharge Instructions for Patients Receiving Chemotherapy  Today you received the following chemotherapy agents taxol, carboplatin  To help prevent nausea and vomiting after your treatment, we encourage you to take your nausea medication as needed Begin taking it at 9pm and take it as often as prescribed for the next 48 hours if needed.   If you develop nausea and vomiting that is not controlled by your nausea medication, call the clinic. If it is after clinic hours your family physician or the after hours number for the clinic or go to the Emergency Department.   BELOW ARE SYMPTOMS THAT SHOULD BE REPORTED IMMEDIATELY:  *FEVER GREATER THAN 100.5 F  *CHILLS WITH OR WITHOUT FEVER  NAUSEA AND VOMITING THAT IS NOT CONTROLLED WITH YOUR NAUSEA MEDICATION  *UNUSUAL SHORTNESS OF BREATH  *UNUSUAL BRUISING OR BLEEDING  TENDERNESS IN MOUTH AND THROAT WITH OR WITHOUT PRESENCE OF ULCERS  *URINARY PROBLEMS  *BOWEL PROBLEMS  UNUSUAL RASH Items with * indicate a potential emergency and should be followed up as soon as possible.  One of the nurses will contact you 24 hours after your treatment. Please let the nurse know about any problems that you may have experienced. Feel free to call the clinic you have any questions or concerns. The clinic phone number is [phone].   I have been informed and understand all the instructions given to me. I know to contact the clinic, my physician, or go to the Emergency Department if any problems should occur. I do not have any questions at this time, but understand that I may call the clinic during office hours   should I have any questions or need assistance in obtaining follow up care.    __________________________________________  _____________  __________ Signature of Patient or Authorized Representative            Date                   Time    __________________________________________ Nurse's Signature

## 2012-07-08 ENCOUNTER — Ambulatory Visit: Payer: PRIVATE HEALTH INSURANCE

## 2012-07-09 ENCOUNTER — Ambulatory Visit
Admission: RE | Admit: 2012-07-09 | Discharge: 2012-07-09 | Disposition: A | Discharge status: Home / Self Care | Payer: PRIVATE HEALTH INSURANCE | Source: Ambulatory Visit | Attending: Radiation Oncology | Admitting: Radiation Oncology

## 2012-07-10 ENCOUNTER — Ambulatory Visit
Admission: RE | Admit: 2012-07-10 | Discharge: 2012-07-10 | Disposition: A | Discharge status: Home / Self Care | Payer: PRIVATE HEALTH INSURANCE | Source: Ambulatory Visit | Attending: Radiation Oncology | Admitting: Radiation Oncology

## 2012-07-12 ENCOUNTER — Other Ambulatory Visit: Payer: Self-pay | Admitting: Oncology

## 2012-07-13 ENCOUNTER — Ambulatory Visit
Admission: RE | Admit: 2012-07-13 | Discharge: 2012-07-13 | Disposition: A | Discharge status: Home / Self Care | Payer: PRIVATE HEALTH INSURANCE | Source: Ambulatory Visit | Attending: Radiation Oncology | Admitting: Radiation Oncology

## 2012-07-14 ENCOUNTER — Other Ambulatory Visit (HOSPITAL_BASED_OUTPATIENT_CLINIC_OR_DEPARTMENT_OTHER): Payer: PRIVATE HEALTH INSURANCE | Admitting: Lab

## 2012-07-14 ENCOUNTER — Encounter: Payer: Self-pay | Admitting: Radiation Oncology

## 2012-07-14 ENCOUNTER — Ambulatory Visit (HOSPITAL_BASED_OUTPATIENT_CLINIC_OR_DEPARTMENT_OTHER): Payer: PRIVATE HEALTH INSURANCE | Admitting: Oncology

## 2012-07-14 ENCOUNTER — Ambulatory Visit
Admission: RE | Admit: 2012-07-14 | Discharge: 2012-07-14 | Disposition: A | Discharge status: Home / Self Care | Payer: PRIVATE HEALTH INSURANCE | Source: Ambulatory Visit | Attending: Radiation Oncology | Admitting: Radiation Oncology

## 2012-07-14 ENCOUNTER — Ambulatory Visit: Payer: PRIVATE HEALTH INSURANCE | Admitting: Lab

## 2012-07-14 ENCOUNTER — Ambulatory Visit (HOSPITAL_BASED_OUTPATIENT_CLINIC_OR_DEPARTMENT_OTHER): Payer: PRIVATE HEALTH INSURANCE

## 2012-07-14 ENCOUNTER — Telehealth: Payer: Self-pay | Admitting: Oncology

## 2012-07-14 VITALS — Wt 121.4 lb

## 2012-07-14 VITALS — BP 111/71 | HR 77 | Temp 98.6°F | Resp 20 | Ht 62.0 in | Wt 122.0 lb

## 2012-07-14 DIAGNOSIS — C155 Malignant neoplasm of lower third of esophagus: Secondary | ICD-10-CM

## 2012-07-14 DIAGNOSIS — D6959 Other secondary thrombocytopenia: Secondary | ICD-10-CM

## 2012-07-14 DIAGNOSIS — R110 Nausea: Secondary | ICD-10-CM

## 2012-07-14 DIAGNOSIS — T451X5A Adverse effect of antineoplastic and immunosuppressive drugs, initial encounter: Secondary | ICD-10-CM

## 2012-07-14 DIAGNOSIS — Z5111 Encounter for antineoplastic chemotherapy: Secondary | ICD-10-CM

## 2012-07-14 DIAGNOSIS — Z79899 Other long term (current) drug therapy: Secondary | ICD-10-CM

## 2012-07-14 DIAGNOSIS — C159 Malignant neoplasm of esophagus, unspecified: Secondary | ICD-10-CM

## 2012-07-14 LAB — CBC WITH DIFFERENTIAL/PLATELET
Eosinophils Absolute: 0 10*3/uL (ref 0.0–0.5)
LYMPH%: 7 % — ABNORMAL LOW (ref 14.0–49.0)
MCHC: 33.8 g/dL (ref 32.0–36.0)
MCV: 87.1 fL (ref 79.3–98.0)
MONO%: 7.6 % (ref 0.0–14.0)
NEUT%: 83.8 % — ABNORMAL HIGH (ref 39.0–75.0)
Platelets: 72 10*3/uL — ABNORMAL LOW (ref 140–400)
RBC: 4.41 10*6/uL (ref 4.20–5.82)
nRBC: 0 % (ref 0–0)

## 2012-07-14 LAB — BASIC METABOLIC PANEL (CC13)
BUN: 10 mg/dL (ref 7.0–26.0)
CO2: 23 mEq/L (ref 22–29)
Calcium: 9 mg/dL (ref 8.4–10.4)
Creatinine: 0.9 mg/dL (ref 0.7–1.3)

## 2012-07-14 MED ORDER — ZOLPIDEM TARTRATE 10 MG PO TABS: 10.0000 mg | ORAL_TABLET | Freq: Every evening | ORAL | Status: DC | PRN

## 2012-07-14 MED ORDER — DEXAMETHASONE SODIUM PHOSPHATE 10 MG/ML IJ SOLN
10.0000 mg | Freq: Once | INTRAMUSCULAR | Status: AC
  Administered 2012-07-14: 10 mg via INTRAVENOUS

## 2012-07-14 MED ORDER — SODIUM CHLORIDE 0.9 % IV SOLN
Freq: Once | INTRAVENOUS | Status: AC
  Administered 2012-07-14: 14:00:00 via INTRAVENOUS

## 2012-07-14 MED ORDER — ONDANSETRON 16 MG/50ML IVPB (CHCC)
16.0000 mg | Freq: Once | INTRAVENOUS | Status: AC
  Administered 2012-07-14: 16 mg via INTRAVENOUS

## 2012-07-14 MED ORDER — DIPHENHYDRAMINE HCL 50 MG/ML IJ SOLN
25.0000 mg | Freq: Once | INTRAMUSCULAR | Status: AC
  Administered 2012-07-14: 25 mg via INTRAVENOUS

## 2012-07-14 MED ORDER — FAMOTIDINE IN NACL 20-0.9 MG/50ML-% IV SOLN
20.0000 mg | Freq: Once | INTRAVENOUS | Status: AC
  Administered 2012-07-14: 20 mg via INTRAVENOUS

## 2012-07-14 MED ORDER — PACLITAXEL CHEMO INJECTION 300 MG/50ML
50.0000 mg/m2 | Freq: Once | INTRAVENOUS | Status: AC
  Administered 2012-07-14: 78 mg via INTRAVENOUS
  Filled 2012-07-14: qty 13

## 2012-07-14 NOTE — Telephone Encounter (Signed)
appts made and pt aware that tx will be added

## 2012-07-14 NOTE — Progress Notes (Signed)
Patient presents to the clinic today accompanied by his daughter and the interpretor for a PUT with Dr. Michell Heinrich. Patient is alert and oriented to person, place, and time. No distress noted. Steady gait noted. Pleasant affect noted. Patient reports occasional stabbing pain in the center of his chest "where the cancer is." Patient reports taking lortab elixir once per day for the pain. Patient reports using Carafate as well. Patient denies nausea, vomiting, and dizziness. Patient reports occasional headache that subsides without medication. Weight stable. Patient reports he eats very little because he feels full after only eating a few bites of food. Patient reports pain with swallowing water and food. Patient reports that he has to chase food with water to "get it to go down." Hoarseness noted. No thrush noted. Reported all findings to Dr. Michell Heinrich.

## 2012-07-14 NOTE — Progress Notes (Signed)
1335 Ok to treat today with platelets @ 72 per Dr. Truett Perna. Patient to receive only Taxol today.

## 2012-07-14 NOTE — Progress Notes (Signed)
   [facility]    OFFICE PROGRESS NOTE   INTERVAL HISTORY:   He returns as scheduled. He continues daily radiation and he is completed 4 treatments with Taxol/carboplatin. He continues to have substernal chest discomfort. The pain is relieved with hydrocodone elixir. He denies mouth sores, bleeding, and nausea. He has mild intermittent tingling in the fingers. This does not interfere with activity.  Objective:  Vital signs in last 24 hours:  Blood pressure 111/71, pulse 77, temperature 98.6 F (37 C), temperature source Oral, resp. rate 20, height 5\' 2"  (1.575 m), weight 122 lb (55.339 kg).    HEENT: No thrush or ulcers Resp: Lungs clear bilaterally Cardio: Regular rate and rhythm GI: No hepatosplenomegaly, nontender Vascular: No leg edema   Lab Results:  Lab Results  Component Value Date   WBC 2.3* 07/14/2012   HGB 13.0 07/14/2012   HCT 38.4 07/14/2012   MCV 87.1 07/14/2012   PLT 72* 07/14/2012   ANC 2.0    Medications: I have reviewed the patient's current medications.  Assessment/Plan: 1. 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 Staging CT scans of the chest, abdomen and pelvis on 06/02/2012 negative for evidence of metastatic disease. Endoscopic ultrasound 06/11/2012 confirmed a uT3 uN1 tumor at 40 cm from the incisors. He began radiation 06/15/2012. He began weekly Taxol/carboplatin chemotherapy 06/16/2012. 2. Solid/liquid dysphagia secondary to the obstructing esophagus mass. 3. Odynophagia/subxiphoid pain secondary to the esophagus mass and radiation. Improved with hydrocodone elixir. 4. Thrombocytopenia secondary to chemotherapy-carboplatin will be held from the week 5 chemotherapy today. He knows to contact us for bleeding.  Disposition:  He appears to be tolerating the chemotherapy and radiation well. He is scheduled to complete radiation on 07/29/2012. We decided  to discontinue carboplatin from the chemotherapy today secondary to thrombocytopenia. He will complete 1 additional week of Taxol/carboplatin on 07/23/2012. He will be scheduled for an office visit on 07/29/2012. He saw Dr. Tyrone Sage and appears to be a surgical candidate.   Thornton Papas, MD  07/14/2012  4:01 PM

## 2012-07-14 NOTE — Patient Instructions (Signed)
[facility] Discharge Instructions for Patients Receiving Chemotherapy  Today you received the following chemotherapy agent Taxol.  To help prevent nausea and vomiting after your treatment, we encourage you to take your nausea medication. Begin taking your nausea medication as often as prescribed for by Dr. Truett Perna.    If you develop nausea and vomiting that is not controlled by your nausea medication, call the clinic. If it is after clinic hours your family physician or the after hours number for the clinic or go to the Emergency Department.   BELOW ARE SYMPTOMS THAT SHOULD BE REPORTED IMMEDIATELY:  *FEVER GREATER THAN 100.5 F  *CHILLS WITH OR WITHOUT FEVER  NAUSEA AND VOMITING THAT IS NOT CONTROLLED WITH YOUR NAUSEA MEDICATION  *UNUSUAL SHORTNESS OF BREATH  *UNUSUAL BRUISING OR BLEEDING  TENDERNESS IN MOUTH AND THROAT WITH OR WITHOUT PRESENCE OF ULCERS  *URINARY PROBLEMS  *BOWEL PROBLEMS  UNUSUAL RASH Items with * indicate a potential emergency and should be followed up as soon as possible.  One of the nurses will contact you 24 hours after your treatment. Please let the nurse know about any problems that you may have experienced. Feel free to call the clinic you have any questions or concerns. The clinic phone number is [phone].   I have been informed and understand all the instructions given to me. I know to contact the clinic, my physician, or go to the Emergency Department if any problems should occur. I do not have any questions at this time, but understand that I may call the clinic during office hours   should I have any questions or need assistance in obtaining follow up care.    __________________________________________  _____________  __________ Signature of Patient or Authorized Representative            Date                   Time    __________________________________________ Nurse's Signature

## 2012-07-14 NOTE — Patient Instructions (Signed)
Bruce [name] 17-Nov-1961 [id number]  [facility] Discharge Instructions  Your exam findings, labs and results were discussed with your MD today.  Filed Vitals:   07/14/12 1200  BP: 111/71  Pulse: 77  Temp: 98.6 F (37 C)  Resp: 20   Current outpatient prescriptions:HYDROcodone-acetaminophen (LORTAB) 7.5-500 MG/15ML solution, Take 15 mLs by mouth every 6 (six) hours as needed for pain., Disp: 300 mL, Rfl: 0;  omeprazole (PRILOSEC) 40 MG capsule, Take 1 capsule (40 mg total) by mouth daily., Disp: 30 capsule, Rfl: 5 sucralfate (CARAFATE) 1 GM/10ML suspension, Take 10 mLs (1 g total) by mouth 4 (four) times daily -  with meals and at bedtime. 5 min before food, Disp: 420 mL, Rfl: 5;  zolpidem (AMBIEN) 10 MG tablet, Take 1 tablet (10 mg total) by mouth at bedtime as needed for sleep., Disp: 30 tablet, Rfl: 0;  prochlorperazine (COMPAZINE) 10 MG tablet, Take 1 tablet (10 mg total) by mouth every 6 (six) hours as needed., Disp: 30 tablet, Rfl: 0 No current facility-administered medications for this visit. Facility-Administered Medications Ordered in Other Visits: 0.9 %  sodium chloride infusion, , Intravenous, Once, Ladene Artist, MD;  dexamethasone (DECADRON) injection 10 mg, 10 mg, Intravenous, Once, Ladene Artist, MD, 10 mg at 07/14/12 1341;  diphenhydrAMINE (BENADRYL) injection 25 mg, 25 mg, Intravenous, Once, Ladene Artist, MD, 25 mg at 07/14/12 1341 famotidine (PEPCID) IVPB 20 mg, 20 mg, Intravenous, Once, Ladene Artist, MD, 20 mg at 07/14/12 1400;  ondansetron (ZOFRAN) IVPB 16 mg, 16 mg, Intravenous, Once, Ladene Artist, MD, 16 mg at 07/14/12 1341;  PACLitaxel (TAXOL) 78 mg in dextrose 5 % 250 mL chemo infusion (</= 80mg /m2), 50 mg/m2 (Treatment Plan Actual), Intravenous, Once, Ladene Artist, MD, 78 mg at 07/14/12 1421  Please visit scheduling to obtain calendar for future appointments.  Please call the [facility] at [phone] during business  hours should you have any further questions or need assistance in obtaining follow-up care. If you have a medical emergency, please dial 911.  Special Instructions:

## 2012-07-14 NOTE — Progress Notes (Signed)
Weekly Management Note Current Dose:27 Gy  Projected Dose:54 Gy   Narrative:  The patient presents for routine under treatment assessment.  CBCT/MVCT images/Port film x-rays were reviewed.  The chart was checked. Doing well. Eating fine. Needs refill for ambien. Accompanied by daughter and Nurse, learning disability.  Physical Findings:  Unchanged  Vitals: There were no vitals filed for this visit. Weight:  Wt Readings from Last 3 Encounters:  07/14/12 121 lb 6.4 oz (55.067 kg)  07/14/12 122 lb (55.339 kg)  07/03/12 122 lb 9.6 oz (55.611 kg)   Lab Results  Component Value Date   WBC 2.3* 07/14/2012   HGB 13.0 07/14/2012   HCT 38.4 07/14/2012   MCV 87.1 07/14/2012   PLT 72* 07/14/2012   Lab Results  Component Value Date   CREATININE 0.9 07/14/2012   BUN 10.0 07/14/2012   NA 138 07/14/2012   K 3.9 07/14/2012   CL 107 07/14/2012   CO2 23 07/14/2012     Impression:  The patient is tolerating radiation.  Plan:  Continue treatment as planned. He is maintaining his weight. Ambien refilled.

## 2012-07-15 ENCOUNTER — Ambulatory Visit
Admission: RE | Admit: 2012-07-15 | Discharge: 2012-07-15 | Disposition: A | Discharge status: Home / Self Care | Payer: PRIVATE HEALTH INSURANCE | Source: Ambulatory Visit | Attending: Radiation Oncology | Admitting: Radiation Oncology

## 2012-07-16 ENCOUNTER — Encounter: Payer: Self-pay | Admitting: Oncology

## 2012-07-16 ENCOUNTER — Ambulatory Visit
Admission: RE | Admit: 2012-07-16 | Discharge: 2012-07-16 | Disposition: A | Discharge status: Home / Self Care | Payer: PRIVATE HEALTH INSURANCE | Source: Ambulatory Visit | Attending: Radiation Oncology | Admitting: Radiation Oncology

## 2012-07-17 ENCOUNTER — Encounter: Payer: Self-pay | Admitting: Radiation Oncology

## 2012-07-17 ENCOUNTER — Telehealth: Payer: Self-pay | Admitting: *Deleted

## 2012-07-17 ENCOUNTER — Ambulatory Visit
Admission: RE | Admit: 2012-07-17 | Discharge: 2012-07-17 | Disposition: A | Discharge status: Home / Self Care | Payer: PRIVATE HEALTH INSURANCE | Source: Ambulatory Visit | Attending: Radiation Oncology | Admitting: Radiation Oncology

## 2012-07-17 VITALS — BP 108/65 | HR 79 | Temp 98.4°F | Resp 20 | Wt 123.0 lb

## 2012-07-17 DIAGNOSIS — C159 Malignant neoplasm of esophagus, unspecified: Secondary | ICD-10-CM

## 2012-07-17 DIAGNOSIS — C155 Malignant neoplasm of lower third of esophagus: Secondary | ICD-10-CM

## 2012-07-17 MED ORDER — HYDROCODONE-ACETAMINOPHEN 7.5-500 MG/15ML PO SOLN: 15.0000 mL | Freq: Four times a day (QID) | ORAL | Status: DC | PRN

## 2012-07-17 MED ORDER — DEXAMETHASONE 4 MG PO TABS: ORAL_TABLET | ORAL | Status: DC

## 2012-07-17 NOTE — Telephone Encounter (Signed)
Called left vm on dr. Annabell Howells nurse phone, disregard previous call, Dr. Mitzi Hansen called in the decadron frompatients bottle 3:57 PM

## 2012-07-17 NOTE — Progress Notes (Signed)
Patient her for weekly rad tx, completed 18/25 esophagus boost, patient alert, oriented x3, with interpreter and male family member Patient, c/o burning when trying to swallow food, only able to get clear liquids down , diodn't sleep well last night only took 1/2 tab ambien because the cannot get new rx filed till Monday states daughter, , asking for refill on on decadron for last chemotherapy, not sure when that will be scheduled by Dr.Sherrill, hi snurse should call decadron in when they find out next chemotherpay  To [facility] dor patient,  , no c/o nausea 3:27 PM

## 2012-07-17 NOTE — Addendum Note (Signed)
Encounter addended by: Lowella Petties, RN on: 07/17/2012  4:15 PM<BR>     Documentation filed: Vitals Section

## 2012-07-17 NOTE — Telephone Encounter (Signed)
Called and left voice message on dr. Danielle Dess  Phone patient daughter stating their  last chemotherapy is next week sometime and patient is out of his decadron will need refill on this at [facility], checked schedule in Epic looks like 07/23/12 at 1100 an for chemo infusion

## 2012-07-17 NOTE — Progress Notes (Signed)
   Department of Radiation Oncology  Phone:  [phone] Fax:        [phone]  Weekly Treatment Note    Name: Bruce [name] Date: 07/17/2012 MRN: [id number] DOB: 1960/11/26   Current dose: 32.4 Gy  Current fraction: 23   MEDICATIONS: Current Outpatient Prescriptions  Medication Sig Dispense Refill  . dexamethasone (DECADRON) 4 MG tablet Take 1 tablet at bedtime night before chemotherapy, and then 1 tablet at 6 AM the day after chemotherapy. Then discontinued use.  5 tablet  0  . HYDROcodone-acetaminophen (LORTAB) 7.5-500 MG/15ML solution Take 15 mLs by mouth every 6 (six) hours as needed for pain.  500 mL  0  . omeprazole (PRILOSEC) 40 MG capsule Take 1 capsule (40 mg total) by mouth daily.  30 capsule  5  . prochlorperazine (COMPAZINE) 10 MG tablet Take 1 tablet (10 mg total) by mouth every 6 (six) hours as needed.  30 tablet  0  . sucralfate (CARAFATE) 1 GM/10ML suspension Take 10 mLs (1 g total) by mouth 4 (four) times daily -  with meals and at bedtime. 5 min before food  420 mL  5  . zolpidem (AMBIEN) 10 MG tablet Take 1 tablet (10 mg total) by mouth at bedtime as needed for sleep.  30 tablet  2     ALLERGIES: Review of patient's allergies indicates no known allergies.   LABORATORY DATA:  Lab Results  Component Value Date   WBC 2.3* 07/14/2012   HGB 13.0 07/14/2012   HCT 38.4 07/14/2012   MCV 87.1 07/14/2012   PLT 72* 07/14/2012   Lab Results  Component Value Date   NA 138 07/14/2012   K 3.9 07/14/2012   CL 107 07/14/2012   CO2 23 07/14/2012   Lab Results  Component Value Date   ALT 25 06/30/2012   AST 21 06/30/2012   ALKPHOS 70 06/30/2012   BILITOT 0.4 06/30/2012     NARRATIVE: Bruce [name] was seen today for weekly treatment management. The chart was checked and the patient's films were reviewed. The patient is complaining of ongoing esophagitis. He states that his pain medicine is working well for him, Lortab elixir, but he is only drinking fluids at this  point. No shortness of breath. No other GI toxicity.  PHYSICAL EXAMINATION: vitals were not taken for this visit.     alert, in no acute distress.  ASSESSMENT: The patient is doing satisfactorily with treatment.  PLAN: We will continue with the patient's radiation treatment as planned. Refills were given for Lortab elixir.

## 2012-07-21 ENCOUNTER — Ambulatory Visit
Admission: RE | Admit: 2012-07-21 | Discharge: 2012-07-21 | Disposition: A | Discharge status: Home / Self Care | Payer: PRIVATE HEALTH INSURANCE | Source: Ambulatory Visit | Attending: Radiation Oncology | Admitting: Radiation Oncology

## 2012-07-22 ENCOUNTER — Other Ambulatory Visit: Payer: Self-pay | Admitting: Oncology

## 2012-07-22 ENCOUNTER — Ambulatory Visit
Admission: RE | Admit: 2012-07-22 | Discharge: 2012-07-22 | Disposition: A | Discharge status: Home / Self Care | Payer: PRIVATE HEALTH INSURANCE | Source: Ambulatory Visit | Attending: Radiation Oncology | Admitting: Radiation Oncology

## 2012-07-23 ENCOUNTER — Other Ambulatory Visit (HOSPITAL_BASED_OUTPATIENT_CLINIC_OR_DEPARTMENT_OTHER): Payer: PRIVATE HEALTH INSURANCE | Admitting: Lab

## 2012-07-23 ENCOUNTER — Ambulatory Visit (HOSPITAL_BASED_OUTPATIENT_CLINIC_OR_DEPARTMENT_OTHER): Payer: PRIVATE HEALTH INSURANCE

## 2012-07-23 ENCOUNTER — Encounter: Payer: Self-pay | Admitting: Radiation Oncology

## 2012-07-23 ENCOUNTER — Other Ambulatory Visit: Payer: Self-pay | Admitting: *Deleted

## 2012-07-23 ENCOUNTER — Ambulatory Visit
Admission: RE | Admit: 2012-07-23 | Discharge: 2012-07-23 | Disposition: A | Discharge status: Home / Self Care | Payer: PRIVATE HEALTH INSURANCE | Source: Ambulatory Visit | Attending: Radiation Oncology | Admitting: Radiation Oncology

## 2012-07-23 ENCOUNTER — Other Ambulatory Visit: Payer: Self-pay | Admitting: Oncology

## 2012-07-23 VITALS — BP 105/74 | HR 77 | Resp 18 | Wt 118.6 lb

## 2012-07-23 VITALS — BP 101/74 | HR 76 | Temp 97.8°F | Resp 18

## 2012-07-23 DIAGNOSIS — C155 Malignant neoplasm of lower third of esophagus: Secondary | ICD-10-CM

## 2012-07-23 DIAGNOSIS — Z5111 Encounter for antineoplastic chemotherapy: Secondary | ICD-10-CM

## 2012-07-23 LAB — CBC WITH DIFFERENTIAL/PLATELET
BASO%: 0 % (ref 0.0–2.0)
EOS%: 0.6 % (ref 0.0–7.0)
MCH: 29.3 pg (ref 27.2–33.4)
MCV: 81.3 fL (ref 79.3–98.0)
MONO%: 6.7 % (ref 0.0–14.0)
RBC: 4.33 10*6/uL (ref 4.20–5.82)
RDW: 13.7 % (ref 11.0–14.6)
lymph#: 0.2 10*3/uL — ABNORMAL LOW (ref 0.9–3.3)
nRBC: 0 % (ref 0–0)

## 2012-07-23 MED ORDER — DEXAMETHASONE SODIUM PHOSPHATE 10 MG/ML IJ SOLN
10.0000 mg | Freq: Once | INTRAMUSCULAR | Status: AC
  Administered 2012-07-23: 10 mg via INTRAVENOUS

## 2012-07-23 MED ORDER — DIPHENHYDRAMINE HCL 50 MG/ML IJ SOLN
25.0000 mg | Freq: Once | INTRAMUSCULAR | Status: AC
  Administered 2012-07-23: 25 mg via INTRAVENOUS

## 2012-07-23 MED ORDER — SODIUM CHLORIDE 0.9 % IV SOLN
95.0000 mg | Freq: Once | INTRAVENOUS | Status: AC
  Administered 2012-07-23: 100 mg via INTRAVENOUS
  Filled 2012-07-23: qty 10

## 2012-07-23 MED ORDER — ONDANSETRON 16 MG/50ML IVPB (CHCC)
16.0000 mg | Freq: Once | INTRAVENOUS | Status: AC
  Administered 2012-07-23: 16 mg via INTRAVENOUS

## 2012-07-23 MED ORDER — FAMOTIDINE IN NACL 20-0.9 MG/50ML-% IV SOLN
20.0000 mg | Freq: Once | INTRAVENOUS | Status: AC
  Administered 2012-07-23: 20 mg via INTRAVENOUS

## 2012-07-23 MED ORDER — PACLITAXEL CHEMO INJECTION 300 MG/50ML
50.0000 mg/m2 | Freq: Once | INTRAVENOUS | Status: AC
  Administered 2012-07-23: 78 mg via INTRAVENOUS
  Filled 2012-07-23: qty 13

## 2012-07-23 MED ORDER — SODIUM CHLORIDE 0.9 % IV SOLN
Freq: Once | INTRAVENOUS | Status: AC
  Administered 2012-07-23: 13:00:00 via INTRAVENOUS

## 2012-07-23 NOTE — Patient Instructions (Signed)
[facility] Discharge Instructions for Patients Receiving Chemotherapy  Today you received the following chemotherapy agents Taxol and Carboplatin  To help prevent nausea and vomiting after your treatment, we encourage you to take your nausea medication as prescribed.   If you develop nausea and vomiting that is not controlled by your nausea medication, call the clinic. If it is after clinic hours your family physician or the after hours number for the clinic or go to the Emergency Department.   BELOW ARE SYMPTOMS THAT SHOULD BE REPORTED IMMEDIATELY:  *FEVER GREATER THAN 100.5 F  *CHILLS WITH OR WITHOUT FEVER  NAUSEA AND VOMITING THAT IS NOT CONTROLLED WITH YOUR NAUSEA MEDICATION  *UNUSUAL SHORTNESS OF BREATH  *UNUSUAL BRUISING OR BLEEDING  TENDERNESS IN MOUTH AND THROAT WITH OR WITHOUT PRESENCE OF ULCERS  *URINARY PROBLEMS  *BOWEL PROBLEMS  UNUSUAL RASH Items with * indicate a potential emergency and should be followed up as soon as possible.  One of the nurses will contact you 24 hours after your treatment. Please let the nurse know about any problems that you may have experienced. Feel free to call the clinic you have any questions or concerns. The clinic phone number is [phone].   

## 2012-07-23 NOTE — Progress Notes (Addendum)
Patient presents to the clinic today accompanied by his daughter and the interpretor for a PUT with Dr. Mitzi Hansen. Patient is alert and oriented to person, place, and time. No distress noted. Steady gait noted. Pleasant affect noted. Patient reports occasional stabbing pain in the center of his chest "where the cancer is." Patient reports this pain has been the worst this week of all. Also, patient reports its worse at night or when he drinks water. Patient reports taking lortab elixir once per day for the pain. Encourage patient to continue Carafate and take Lortab every six hours instead of once per day. Patient reports using Carafate as well. Patient denies nausea, vomiting, and dizziness. Patient reports occasional headache that subsides without medication. Weight loss of 5 pounds noted since last week. Patient reports he eats very little because he feels full after only eating a few bites of food. Patient reports pain with swallowing water and food. Patient reports that he has to chase food with water to "get it to go down." Patient reports his throat is very sore despite the fact that hoarseness is better. No thrush noted. Reported all findings to Dr Mitzi Hansen.

## 2012-07-23 NOTE — Progress Notes (Signed)
1305 Per Dr. Truett Perna, OK to treat with WBC 1.8, Platelets 79, dose reducing Carboplatin

## 2012-07-23 NOTE — Progress Notes (Signed)
Department of Radiation Oncology  Phone:  [phone] Fax:        [phone]  Weekly Treatment Note    Name: Bruce [name] Date: 07/23/2012 MRN: [id number] DOB: [birth year]   Current dose: 45 Gy  Current fraction:26 planned for today   MEDICATIONS: Current Outpatient Prescriptions  Medication Sig Dispense Refill  . dexamethasone (DECADRON) 4 MG tablet Take 1 tablet at bedtime night before chemotherapy, and then 1 tablet at 6 AM the day after chemotherapy. Then discontinued use.  5 tablet  0  . HYDROcodone-acetaminophen (LORTAB) 7.5-500 MG/15ML solution Take 15 mLs by mouth every 6 (six) hours as needed for pain.  500 mL  0  . omeprazole (PRILOSEC) 40 MG capsule Take 1 capsule (40 mg total) by mouth daily.  30 capsule  5  . prochlorperazine (COMPAZINE) 10 MG tablet Take 1 tablet (10 mg total) by mouth every 6 (six) hours as needed.  30 tablet  0  . sucralfate (CARAFATE) 1 GM/10ML suspension Take 10 mLs (1 g total) by mouth 4 (four) times daily -  with meals and at bedtime. 5 min before food  420 mL  5  . zolpidem (AMBIEN) 10 MG tablet Take 1 tablet (10 mg total) by mouth at bedtime as needed for sleep.  30 tablet  2   No current facility-administered medications for this encounter.   Facility-Administered Medications Ordered in Other Encounters  Medication Dose Route Frequency Provider Last Rate Last Dose  . 0.9 %  sodium chloride infusion   Intravenous Once Ladene Artist, MD      . CARBOplatin (PARAPLATIN) 100 mg in sodium chloride 0.9 % 100 mL chemo infusion  100 mg Intravenous Once Ladene Artist, MD   100 mg at 07/23/12 1528  . dexamethasone (DECADRON) injection 10 mg  10 mg Intravenous Once Ladene Artist, MD   10 mg at 07/23/12 1329  . diphenhydrAMINE (BENADRYL) injection 25 mg  25 mg Intravenous Once Ladene Artist, MD   25 mg at 07/23/12 1329  . famotidine (PEPCID) IVPB 20 mg  20 mg Intravenous Once Ladene Artist, MD   20 mg at 07/23/12 1345  . ondansetron  (ZOFRAN) IVPB 16 mg  16 mg Intravenous Once Ladene Artist, MD   16 mg at 07/23/12 1329  . PACLitaxel (TAXOL) 78 mg in dextrose 5 % 250 mL chemo infusion (</= 80mg /m2)  50 mg/m2 (Treatment Plan Actual) Intravenous Once Ladene Artist, MD   78 mg at 07/23/12 1422     ALLERGIES: Review of patient's allergies indicates no known allergies.   LABORATORY DATA:  Lab Results  Component Value Date   WBC 1.8* 07/23/2012   HGB 12.7* 07/23/2012   HCT 35.2* 07/23/2012   MCV 81.3 07/23/2012   PLT 79* 07/23/2012   Lab Results  Component Value Date   NA 138 07/14/2012   K 3.9 07/14/2012   CL 107 07/14/2012   CO2 23 07/14/2012   Lab Results  Component Value Date   ALT 25 06/30/2012   AST 21 06/30/2012   ALKPHOS 70 06/30/2012   BILITOT 0.4 06/30/2012     NARRATIVE: Bruce [name] was seen today for weekly treatment management. The chart was checked and the patient's films were reviewed. The patient is seen prior to his 26th fraction. The patient's primary complaint is that of esophagitis. The patient has lost 5 pounds over the last week. He has only been using Lortab once a day. Using this much more frequently  and also with Carafate has been discussed with him. No other additional new complaints.   PHYSICAL EXAMINATION: weight is 118 lb 9.6 oz (53.797 kg). His blood pressure is 105/74 and his pulse is 77. His respiration is 18.        ASSESSMENT: The patient is doing satisfactorily with treatment, although he is having a significant amount of esophagitis. I do believe that this will be much better controlled with an increase in his pain medicines.  PLAN: We will continue with the patient's radiation treatment as planned. The patient was really stressed to use pain medicines more liberally and to also increase his by mouth intake, especially with respect to fluids in the short-term. The patient and his family expressed an understanding of this.

## 2012-07-24 ENCOUNTER — Ambulatory Visit
Admission: RE | Admit: 2012-07-24 | Discharge: 2012-07-24 | Disposition: A | Discharge status: Home / Self Care | Payer: PRIVATE HEALTH INSURANCE | Source: Ambulatory Visit | Attending: Radiation Oncology | Admitting: Radiation Oncology

## 2012-07-24 ENCOUNTER — Ambulatory Visit: Payer: PRIVATE HEALTH INSURANCE

## 2012-07-27 ENCOUNTER — Ambulatory Visit: Payer: PRIVATE HEALTH INSURANCE

## 2012-07-27 ENCOUNTER — Ambulatory Visit
Admission: RE | Admit: 2012-07-27 | Discharge: 2012-07-27 | Disposition: A | Discharge status: Home / Self Care | Payer: PRIVATE HEALTH INSURANCE | Source: Ambulatory Visit | Attending: Radiation Oncology | Admitting: Radiation Oncology

## 2012-07-28 ENCOUNTER — Ambulatory Visit
Admission: RE | Admit: 2012-07-28 | Discharge: 2012-07-28 | Disposition: A | Discharge status: Home / Self Care | Payer: PRIVATE HEALTH INSURANCE | Source: Ambulatory Visit | Attending: Radiation Oncology | Admitting: Radiation Oncology

## 2012-07-29 ENCOUNTER — Ambulatory Visit
Admission: RE | Admit: 2012-07-29 | Discharge: 2012-07-29 | Disposition: A | Discharge status: Home / Self Care | Payer: PRIVATE HEALTH INSURANCE | Source: Ambulatory Visit | Attending: Radiation Oncology | Admitting: Radiation Oncology

## 2012-07-29 ENCOUNTER — Telehealth: Payer: Self-pay | Admitting: Oncology

## 2012-07-29 ENCOUNTER — Encounter: Payer: Self-pay | Admitting: Radiation Oncology

## 2012-07-29 ENCOUNTER — Ambulatory Visit (HOSPITAL_BASED_OUTPATIENT_CLINIC_OR_DEPARTMENT_OTHER): Payer: PRIVATE HEALTH INSURANCE

## 2012-07-29 ENCOUNTER — Ambulatory Visit (HOSPITAL_BASED_OUTPATIENT_CLINIC_OR_DEPARTMENT_OTHER): Payer: PRIVATE HEALTH INSURANCE | Admitting: Nurse Practitioner

## 2012-07-29 VITALS — BP 99/63 | HR 99 | Temp 97.0°F | Resp 18 | Ht 62.0 in | Wt 116.5 lb

## 2012-07-29 VITALS — BP 115/95 | HR 73 | Temp 98.2°F | Resp 20 | Wt 116.6 lb

## 2012-07-29 DIAGNOSIS — C155 Malignant neoplasm of lower third of esophagus: Secondary | ICD-10-CM

## 2012-07-29 DIAGNOSIS — R1310 Dysphagia, unspecified: Secondary | ICD-10-CM

## 2012-07-29 DIAGNOSIS — G893 Neoplasm related pain (acute) (chronic): Secondary | ICD-10-CM

## 2012-07-29 LAB — CBC WITH DIFFERENTIAL/PLATELET
Basophils Absolute: 0 10*3/uL (ref 0.0–0.1)
EOS%: 1.6 % (ref 0.0–7.0)
HCT: 34 % — ABNORMAL LOW (ref 38.4–49.9)
HGB: 12 g/dL — ABNORMAL LOW (ref 13.0–17.1)
MCH: 30 pg (ref 27.2–33.4)
MONO#: 0.3 10*3/uL (ref 0.1–0.9)
NEUT#: 1.2 10*3/uL — ABNORMAL LOW (ref 1.5–6.5)
NEUT%: 73 % (ref 39.0–75.0)
RDW: 14.2 % (ref 11.0–14.6)
WBC: 1.6 10*3/uL — ABNORMAL LOW (ref 4.0–10.3)
lymph#: 0 10*3/uL — ABNORMAL LOW (ref 0.9–3.3)

## 2012-07-29 NOTE — Telephone Encounter (Signed)
Gave pt appt for October 2013 MD only

## 2012-07-29 NOTE — Telephone Encounter (Signed)
Gave pt , appt for October Md visit only , called Luana for interpreter for October

## 2012-07-29 NOTE — Progress Notes (Signed)
Clinic note: The patient is seen today after his final treatment in the management of his carcinoma of the esophagus. He is without new complaints today. He uses Carafate and Lortab elixir for his pain on swallowing/esophagitis. His weight is down 2 pounds over the past week. His upcoming his diet with Ensure  Impression: Satisfactory progress.  Plan: Visit with Dr. Mitzi Hansen one month or sooner if symptoms worsen.

## 2012-07-29 NOTE — Progress Notes (Signed)
OFFICE PROGRESS NOTE  Interval history:  Bruce [name] returns as scheduled. He completed the sixth Taxol/carboplatin chemotherapy on 07/23/2012. Last radiation treatment is scheduled today.  He is experiencing odynophagia and mild dysphagia. He is tolerating soft solids and liquids. Certain foods feel like they are getting stuck. He is taking Carafate, a proton pump inhibitor and has Vicodin elixir as needed for the pain. He reports the Vicodin elixir adequately relieves the pain. He denies nausea/vomiting. Bowels moving regularly. Appetite is poor.   Objective: Blood pressure 99/63, pulse 99, temperature 97 F (36.1 C), temperature source Oral, resp. rate 18, height 5\' 2"  (1.575 m), weight 116 lb 8 oz (52.844 kg).  Oropharynx is without thrush or ulceration. Mucous membranes are moist. Lungs clear. Regular cardiac rhythm. Abdomen soft and nontender. No organomegaly. Extremities are without edema.  Lab Results: Lab Results  Component Value Date   WBC 1.6* 07/29/2012   HGB 12.0* 07/29/2012   HCT 34.0* 07/29/2012   MCV 85.1 07/29/2012   PLT 97* 07/29/2012    Chemistry:    Chemistry      Component Value Date/Time   NA 138 07/14/2012 1138   NA 136 06/30/2012 1318   K 3.9 07/14/2012 1138   K 4.0 06/30/2012 1318   CL 107 07/14/2012 1138   CL 103 06/30/2012 1318   CO2 23 07/14/2012 1138   CO2 23 06/30/2012 1318   BUN 10.0 07/14/2012 1138   BUN 11 06/30/2012 1318   CREATININE 0.9 07/14/2012 1138   CREATININE 0.74 06/30/2012 1318   CREATININE 1.10 04/20/2012 1647      Component Value Date/Time   CALCIUM 9.0 07/14/2012 1138   CALCIUM 9.2 06/30/2012 1318   ALKPHOS 70 06/30/2012 1318   AST 21 06/30/2012 1318   ALT 25 06/30/2012 1318   BILITOT 0.4 06/30/2012 1318       Studies/Results: No results found.  Medications: I have reviewed the patient's current medications.  Assessment/Plan:  1. Distal esophagus mass. Status post endoscopic biopsy on 05/28/2012 with pathology confirming invasive  squamous cell carcinoma. Staging CT scans of the chest, abdomen and pelvis on 06/02/2012 negative for evidence of metastatic disease. Endoscopic ultrasound 06/11/2012 confirmed a uT3 uN1 tumor at 40 cm from the incisors. He began radiation 06/15/2012. He began weekly Taxol/carboplatin chemotherapy 06/16/2012. He completed 6 weekly chemotherapy treatments. The carboplatin was held with week 5 due to thrombocytopenia. He completed the sixth weekly treatment with Taxol and carboplatin on 07/23/2012. 2. Solid/liquid dysphagia secondary to the obstructing esophagus mass. 3. Odynophagia/subxiphoid pain secondary to the esophagus mass and radiation. Improved with hydrocodone elixir. 4. Thrombocytopenia secondary to chemotherapy. Improved.  Disposition-Mr. Viscomi appears stable. He will complete the course of radiation today. We are referring him back to Dr. Tyrone Sage for surgery planning. He will return for a followup visit here the week of 09/14/2012. He is aware that he should contact the office if he becomes unable to tolerate liquids.  Plan reviewed with Dr. Truett Perna.  An interpreter was present throughout today's visit.  Lonna Cobb ANP/GNP-BC

## 2012-07-29 NOTE — Progress Notes (Signed)
Patient here weekly rad tx completed 30/30 today, still has esophagitis, alert,oriented x3, interpreter  [facility], with patient No nausea, regular bowel movments, takes lortab elixir q 6 hours now, says helps pain in throat, had taxol/carboplating in med onc today earlier, , lost 2 more pounds, drinks rice soup, no rice in this, and drinks water and ensure 2 cans daily,encouraged to increase ensure till appetite returns,1 month f/u appt with Dr. Mitzi Hansen 1:56 PM

## 2012-08-03 ENCOUNTER — Telehealth: Payer: Self-pay | Admitting: *Deleted

## 2012-08-03 NOTE — Telephone Encounter (Signed)
Per Dr.Moody,  Called hydrocodone/acetameniphen 7.5/325mg   elixir  To [facility] pharmacy  Take 15ml q 6 hours prn pain,dispense , no reful;l, spoke with Mark,pharmacist, family male member was in the waiting room, informed her to wait a couple hours before going  To [facility]  To pick up pain medication,   4:01 PM

## 2012-08-03 NOTE — Telephone Encounter (Signed)
Daughter called stating that the prescription refill called today at 1600 was not received by [facility] on [facility].  Verified script for Hydrocodone Lortab Elixir 7.5/500ml :15 ml every 6 hours prn pain for a total of 500 ml with Dr, Mitzi Hansen. Called in to [facility] and talked with [place] with above script.  She relayed that the script will be ready in 15 minutes and I informed daughter.

## 2012-08-03 NOTE — Progress Notes (Signed)
°  Radiation Oncology         [phone] ________________________________  Name: Bruce [name] MRN: [id number]  Date: 06/15/2012  DOB: 06/19/1961  Simulation Verification Note  Status:  Outpatient  NARRATIVE: The patient was brought to the treatment unit and placed in the planned treatment position. The clinical setup was verified. Then port films were obtained and uploaded to the radiation oncology medical record software.  The treatment beams were carefully compared against the planned radiation fields. The position location and shape of the radiation fields was reviewed. They targeted volume of tissue appears to be appropriately covered by the radiation beams. Organs at risk appear to be excluded as planned.  Based on my personal review, I approved the simulation verification. The patient's treatment will proceed as planned.  ------------------------------------------------  Artist Pais Kathrynn Running, M.D.

## 2012-08-11 NOTE — Progress Notes (Signed)
  Radiation Oncology         [phone] ________________________________  Name: Bruce [name] MRN: [id number]  Date: 06/08/2012  DOB: 29-Sep-1961   SIMULATION AND TREATMENT PLANNING NOTE  DIAGNOSIS:  Esophageal cancer  NARRATIVE:  The patient was brought to the CT Simulation planning suite.  Identity was confirmed.  All relevant records and images related to the planned course of therapy were reviewed.   Written consent to proceed with treatment was confirmed which was freely given after reviewing the details related to the planned course of therapy had been reviewed with the patient.  Then, the patient was set-up in a stable reproducible supine position for radiation therapy.  The patient's arms were raised above using a wing board device. CT images were obtained.  An isocenter was placed within the chest in relation to the target volume. Surface markings were placed.    The CT images were loaded into the planning software.  Then the target and avoidance structures were contoured.  Treatment planning then occurred.  The radiation prescription was entered and confirmed.  A total of 3 complex treatment devices were fabricated which correspond to the designed customized radiation treatment fields. Each of these customized fields/ complex treatment devices will be used on a daily basis during the radiation course. I have requested a 3D Simulation.  I have requested a DVH of the following structures: target volume, spinal cord, lungs, esophagus, liver.   PLAN:  The patient will initially receive 9 Gy in 5 fractions. It is anticipated that the patient will then received a 45 gray boost. The boost treatment will consist of a IMRT technique. The patient's final total dose will be 54 gray.  Special treatment procedure The patient will receive chemotherapy during the course of radiation treatment. The patient may experience increased or overlapping toxicity due to this combined-modality approach and the  patient will be monitored for such problems. This may include extra lab work as necessary. This therefore constitutes a special treatment procedure.  ________________________________   Radene Gunning, MD, PhD

## 2012-08-11 NOTE — Progress Notes (Addendum)
Complex simulation  The patient has initially been planned to receive 9 gray using a 3-D conformal technique. The patient will now receive an additional course of treatment corresponding to an additional 45 gray. This will be performed at 1.8 gray per fraction. The patient presented again for simulation and a CT scan was obtained with the patient in a supine, stable treatment position. The images were loaded into the treatment planning software and all relevant records were confirmed.  The patient's boost treatment will consist of a IMRT technique on her chemotherapy unit. This is necessary given the extent and location of the tumor and the critical normal structures which are in close proximity. This includes the lungs, heart, spinal cord, and liver. A PTV 4500 therefore has been contoured as the primary target structure in the dose volume histograms of each of the above structures will be reviewed as part of the treatment planning process. And I am RT technique is necessary to ensure adequate treatment of the primary tumor while minimizing to an acceptable degree the potential side effects.  Daily image guidance will be performed to ensure accurate localization of the target and accurate sparing of the normal tissues.  The patient's final total dose at the end of this course of treatment will be 45 gray.

## 2012-08-11 NOTE — Addendum Note (Signed)
Encounter addended by: Jonna Coup, MD on: 08/11/2012  7:25 PM<BR>     Documentation filed: Notes Section

## 2012-08-11 NOTE — Progress Notes (Signed)
  Radiation Oncology         [phone] ________________________________  Name: Bruce [name] MRN: [id number]  Date: 07/29/2012  DOB: 1961/11/12  End of Treatment Note  Diagnosis:   Esophageal cancer     Indication for treatment:  Curative       Radiation treatment dates:   06/15/2012 through 07/29/2012  Site/dose:   The patient was treated initially with a 3 field, 3-D conformal technique. This was given to the gross tumor volume in the lower esophagus to a dose of 9 gray at 1.8 gray per fraction. The patient then received a boost treatment for an additional 45 gray. This was given using a IMRT technique on her chemotherapy unit with daily image guidance. The final total dose was 54 gray. Concurrent chemotherapy was given.  Narrative: The patient tolerated radiation treatment relatively well.   The patient did exhibit some esophagitis, especially towards the end of treatment. This was managed medically.  Plan: The patient has completed radiation treatment. The patient will return to radiation oncology clinic for routine followup in one month. I advised the patient to call or return sooner if they have any questions or concerns related to their recovery or treatment. ________________________________  Radene Gunning, M.D., Ph.D.

## 2012-08-19 ENCOUNTER — Telehealth: Payer: Self-pay | Admitting: *Deleted

## 2012-08-19 ENCOUNTER — Other Ambulatory Visit: Payer: Self-pay | Admitting: Radiation Oncology

## 2012-08-19 DIAGNOSIS — C159 Malignant neoplasm of esophagus, unspecified: Secondary | ICD-10-CM

## 2012-08-19 NOTE — Telephone Encounter (Signed)
Zolpidem refill called to [facility] pharmacy, [phone] w/2 refills per Dr Mitzi Hansen. Called daughter to inform but no vm set up, unable to leave message or reach daughter w/2 attempts.

## 2012-08-19 NOTE — Telephone Encounter (Signed)
Pt's daughter called requesting refill on Ambien 10 mg to [facility] , [address]. Informed her will route request to Dr Mitzi Hansen.

## 2012-08-19 NOTE — Telephone Encounter (Signed)
Daughter called re: Zolpidem refill. Informed her Frazier Butt was called and given refill on script w/2 additional refills. Daughter verbalized understanding.

## 2012-08-20 ENCOUNTER — Encounter: Payer: PRIVATE HEALTH INSURANCE | Admitting: Cardiothoracic Surgery

## 2012-08-21 ENCOUNTER — Encounter: Payer: Self-pay | Admitting: Cardiothoracic Surgery

## 2012-08-21 ENCOUNTER — Ambulatory Visit (INDEPENDENT_AMBULATORY_CARE_PROVIDER_SITE_OTHER): Payer: PRIVATE HEALTH INSURANCE | Admitting: Cardiothoracic Surgery

## 2012-08-21 ENCOUNTER — Other Ambulatory Visit: Payer: Self-pay | Admitting: *Deleted

## 2012-08-21 VITALS — BP 103/69 | HR 92 | Resp 18 | Ht 60.0 in | Wt 120.0 lb

## 2012-08-21 DIAGNOSIS — C159 Malignant neoplasm of esophagus, unspecified: Secondary | ICD-10-CM

## 2012-08-21 NOTE — Patient Instructions (Signed)
Nothing by mouth after midnight Tuesday night oct 8 Clear liquids only Monday and Tuesday Oct 7 & 8 Drink half bottle of magnesium citrate Tuesday Oct 8   Esophageal Cancer Esophageal cancer occurs when abnormal cells within the esophagus begin to divide rapidly and uncontrollably. The esophagus is the tube that carries food and drink from the throat into the stomach.   CAUSES   The exact cause of esophageal cancer is not known. It is believed that esophageal cancer occurs due to many factors. People are at a higher risk of developing esophageal cancer if they:  Are older than 50 years of age.   Are male.   Smoke or use tobacco.   Drink heavily.   Eat a poor diet (low in fruits and vegetables).   Are overweight (obese).   Have conditions which result in long-standing damage or irritation to the esophagus. These conditions include:   Acid reflux (the stomach acid leaks back up the esophagus, damaging it).   Barrett Esophagus (abnormal cells are found in the lower part of the esophagus, usually due to acid reflux occurring over a long period of time).   Achalasia (the muscles and nerves of the esophagus do not work properly. Food and drink do not move in a normal way down the esophagus and into the stomach).   Esophageal webs (thin strings of tissues grow within the esophagus).   Damage due to toxic exposures (an example would be swallowing a caustic poison such as lye).  SYMPTOMS   Symptoms can include:  Trouble swallowing.   Chest or back pain.   Unintentional weight loss.   Severe tiredness (fatigue).   Hoarse voice.   Cough.  DIAGNOSIS  Esophageal cancer is usually diagnosed by performing:  Barium swallow. After drinking barium (a liquid that coats the esophagus), a series of X-rays are taken which can reveal abnormalities.   Endoscopic exam. A lighted scope is used to view the inside of the esophagus.   Biopsy. Small pieces of the esophagus are removed for  exam in a lab. This can be done through the scope used for an endoscopic exam.  TREATMENT   Treatment of esophageal cancer depends on a number of factors, including:    Characteristics of the cancer cells present.   Tumor size.   Whether the cancer has spread to lymph nodes or to other more distant locations (such as other organs or bone).  The types of treatments used for esophageal cancer include:  Surgery to remove as much of the cancer as possible.   Chemotherapy (medicines that kill cancer cells).   Radiation therapy to kill cancer cells.   Combinations of chemotherapy and radiation therapy.   Biological therapy that uses antibodies in ways that can take advantage of the weaknesses of the tumor cells.  Your caregivers will also address your needs for pain relief, nutrition, and help with swallowing problems if these develop.   HOME CARE INSTRUCTIONS    Only take over-the-counter or prescription medicines for pain, discomfort or fever as directed by your caregiver.   Maintain a healthy diet. Advice from a nutritionist can be helpful when addressing your specific needs.   Consider joining a support group. This may help you learn to cope with the stress of having esophageal cancer.   Seek advice to help you manage treatment side effects.  SEEK MEDICAL CARE IF:  You develop problems with swallowing that are getting worse.   You notice new fatigue or weakness.  You experience unintentional weight loss.  SEEK IMMEDIATE MEDICAL CARE IF:  You have a sudden increase in pain.   You have trouble breathing.   You have a fever.   You vomit blood or black material that looks like coffee grounds.   You faint.  Document Released: 10/17/2008 Document Revised: 01/27/2012 Document Reviewed: 10/17/2008 [facility] Patient Information 2013 [place], [place].   Esophagogastrectomy Esophagogastrectomy is a surgery to remove a diseased portion of your esophagus. The upper portion of your  stomach and some lymph nodes in the area are removed at the same time. In most cases, your stomach is then attached directly to the remaining portion of the esophagus. Esophagogastrectomy is often done as a treatment for cancer or precancerous conditions. The surgery may also be done to treat severe injuries to the esophagus. LET YOUR CAREGIVER KNOW ABOUT:    Allergies to food or medicine.   Medicines taken, including vitamins, herbs, eyedrops, over-the-counter medicines, and creams.   Use of steroids (by mouth or creams).   Previous problems with anesthetics or numbing medicines.   History of bleeding problems or blood clots.   Previous surgery.   Other health problems, including diabetes and kidney problems.   Possibility of pregnancy, if this applies.  RISKS AND COMPLICATIONS    Allergies to medicines used during the procedure.   Problems with breathing.   Bleeding.   Infection.   Damage to other structures near the esophagus and stomach.   Problems with swallowing.  BEFORE THE PROCEDURE    Stop smoking if you smoke. Stopping will improve your health after surgery.   You may have blood tests to make sure your blood clots normally. Ask your caregiver about changing or stopping your regular medicines. You may be asked to stop taking blood thinners (anticoagulants) or nonsteroidal anti-inflammatory drugs (NSAIDs).   Do not eat or drink anything at least 8 hours before the surgery.   You and your caregiver will talk about the different surgical approaches. Together, you will agree on the surgical approach that is right for you.  PROCEDURE    An intravenous (IV) access tube is put into one of your veins to give you fluids and medicines.   You will receive medicines to relax you (sedatives) and medicines that make you sleep (general anesthetic).   You may have a flexible tube (catheter) put into your bladder to drain urine.   You may have a tube put through your nose or  mouth into your stomach (NG tube) to remove digestive juices and prevent you from vomiting and feeling nauseous.   Surgical cuts (incisions) may be made in the throat, chest, or abdomen. Multiple smaller incisions may be made if the surgery can be done using a scope.   The part of the stomach to be removed will be stapled off and taken out.   The diseased length of esophagus will be removed, as well as lymph nodes in the area.   The remaining portion of the stomach will be attached to the remaining portion of the esophagus.   All incisions will be closed with stitches (sutures), staples, or surgical glue.  AFTER THE PROCEDURE    You will wake up in a recovery room, resting in bed until you have fully returned to consciousness.   You will have some pain. Pain medicines will be available to help you.   Your temperature, breathing rate, heart rate, blood pressure, and oxygen level (vital signs) will be monitored regularly.   You  may be admitted to an intensive care unit (ICU) in the hospital for 1 2 days. There, you can be closely monitored.   The head of your bed will be kept at an upright angle.   You will likely have an NG tube that goes through your nose and into your stomach.   You may have a feeding tube. This tube will give you the proper nutrition until you can take food by mouth again.   You will likely have multiple tubes that are draining fluid from the incision.   You will have a catheter in your bladder.   You will continue to receive IV fluids.   You may have compression stockings on your legs to prevent blood clots.   You will be taught some breathing exercises. These will help your lungs recover from the anesthesia.   When you are more stable, you will be transferred to a regular hospital room.   You will probably be in the hospital for about 10 14 days. During this time, various drains, tubes, and monitors will slowly be discontinued when they are no longer  needed. You will also be slowly eased into doing more activity and drinking and eating by mouth again.  Document Released: 05/05/2012 Document Reviewed: 03/04/2012 [facility] Patient Information 2013 [place], [place].   Esophagogastrectomy Care After Refer to this sheet in the next few weeks. These instructions provide you with information on caring for yourself after your procedure. Your caregiver may also give you more specific instructions. Your treatment has been planned according to current medical practices, but problems sometimes occur. Call your caregiver if you have any problems or questions after your procedure. HOME CARE INSTRUCTIONS    Know how to care for your temporary feeding tube. You will be given instructions on how to use it before you leave the hospital. You will have one for about 2 3 weeks. The feeding tube will be removed when you are able to take an oral diet that is sufficient. A home health nurse may come to your home to supervise your use of the tube.   Take all medicines as directed.   Do not use over-the-counter medicines, vitamins, or herbal supplements without your caregiver's approval.   Do not drive while you are using narcotic pain medicines or medicines for nausea.   Follow the diet prescribed by your caregiver.   Follow your caregiver's recommendations for activity level, amount of weight you can lift, and time frame for return to work. These recommendations will depend on the type of surgery you had and your general health.   Care for your surgical cuts (incisions) and bandages (dressings) as directed by your caregiver.   Check with your surgeon before you fly on a plane. Air travel may be allowed 1 week after surgery.   You may shower unless instructed otherwise. Do not scrub the incisions. Pat them dry. The use of your arms overhead to wash your hair may cause fatigue, shortness of breath, or pain. You may need someone to help you.   No baths, hot tubs,  or swimming until your caregiver approves.   Check with your surgeon before resuming sexual activity. Sexual activity may be restricted for 4 6 weeks after surgery to allow the incisions to heal adequately.   Keep all follow-up appointments as directed by your caregiver.  SEEK MEDICAL CARE IF:    You have increased pain that does not improve with medicine.   You have trouble swallowing.   You  have heartburn or indigestion.   You feel nauseous or vomit.   You cannot have a bowel movement (constipated) or have diarrhea.   You have a rash.   You have burning with urination, or need to urinate more frequently than usual.   You have a new cough.  SEEK IMMEDIATE MEDICAL CARE IF:    You have a fever or persistent symptoms for more than 2 3 days.   You have a fever and your symptoms suddenly get worse.   You have chills.   You have chest pain.   You have shortness of breath.   Your dressing on your incision becomes soaked with blood.   Your incision becomes red, opens up, or has a creamy or bad smelling discharge.   You have a severe increase in pain.   You have pain, tenderness, or redness in your calf.  Document Released: 05/05/2012 Document Reviewed: 03/04/2012 [facility] Patient Information 2013 [place], [place].

## 2012-08-21 NOTE — Progress Notes (Signed)
[address]            [place] [address]          [phone]      Joseantonio Dittmar [facility] Medical Record #[id number] Date of Birth: 08/17/1961  Referring: Ladene Artist, MD Primary Care: No primary provider on file.  Chief Complaint:    Chief Complaint  Patient presents with  . Esophageal Cancer    Further discuss surgery, S/P Chemo and Rad.    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 He has completed chemotherapy and radiation treatment approximately one month ago. Currently he feels well is taking a diet relatively well and has gained 5 pounds since last seen.      Current Activity/ Functional Status: Patient is independent with mobility/ambulation, transfers, ADL's, IADL's.   Past Medical History  Diagnosis Date  . Esophagitis   . Mass of esophagus 05/28/2012    BX'D DISTAL ESOPHAGUS,PENDING  . Dysphasia     solid and liquid  . Neuropathy     right hand numbness 1 year 2012  . Cancer 05/28/12    bx=esophagus=squamous cell carcinoma    Past Surgical History  Procedure Date  . Esophagogastroduodenoscopy 05/28/12    with biopsy mass distal esophagus ending ge junction  =invasiver squamous cell ca  . Eus 06/11/2012    Procedure: UPPER ENDOSCOPIC ULTRASOUND (EUS) RADIAL;  Surgeon: Rachael Fee, MD;  Location: [facility];  Service: Endoscopy;  Laterality: N/A;    Family History  Problem Relation Age of Onset  . Breast cancer Sister     History   Social History   . Marital Status: Married    Spouse Name: N/A    Number of Children: N/A  . Years of Education: N/A   Occupational History  . Not on file.   Social History Main Topics  . Smoking status: Former Smoker -- 0.4 packs/day    Types: Cigarettes    Quit date: 06/19/2011  . Smokeless tobacco: Former Neurosurgeon    Quit date: 06/03/2007  . Alcohol Use: 8.4 oz/week    14 Cans of beer per week  . Drug Use: No  . Sexually Active: Not on file   Other Topics Concern  . Not on file   Social History Narrative  . No narrative on file    History  Smoking status  . Former Smoker -- 0.4 packs/day  . Types: Cigarettes  . Quit date: 06/19/2011  Smokeless tobacco  . Former Neurosurgeon  . Quit date: 06/03/2007    History  Alcohol Use  . 8.4 oz/week  . 14 Cans of beer per week     No Known Allergies  Current Outpatient Prescriptions  Medication Sig Dispense Refill  . omeprazole (PRILOSEC) 40 MG capsule Take 1 capsule (40 mg total) by mouth daily.  30 capsule  5  . zolpidem (AMBIEN) 10 MG tablet Take 10 mg by mouth at bedtime as needed.  Review of Systems:     Cardiac Review of Systems: Y or N  Chest Pain [  n ]  Resting SOB [ n  ] Exertional SOB  [ n ]  Orthopnea [ n ]   Pedal Edema [  n ]    Palpitations [ n ] Syncope  [n  ]   Presyncope [   ]  General Review of Systems: [Y] = yes [  ]=no Constitional: recent weight change [ y ]; anorexia [  ]; fatigue [ y ]; nausea [  ]; night sweats [  ]; fever [n  ]; or chills [  n];                                                                                                                                          Dental: poor dentition[ n ]; Last Dentist visit: one month ago  Eye : blurred vision [ n ]; diplopia [   ]; vision changes [ n];  Amaurosis fugax[  ]; Resp: cough [  ];  wheezing[n  ];  hemoptysis[  ]; shortness of breath[n ]; paroxysmal nocturnal dyspnea[ n ]; dyspnea on exertion[  ]; or orthopnea[  ];  GI:  gallstones[ n ],  vomiting[ n ];  dysphagia[  ]; melena[  ];  hematochezia [  ]; heartburn[  ];   Hx of  Colonoscopy[  ]; GU: kidney stones [  ]; hematuria[n  ];   dysuria [  ];  nocturia[  ];  history of     obstruction [  ];             Skin: rash, swelling[  ];, hair loss[  ];  peripheral edema[  ];  or itching[  ]; Musculosketetal: myalgias[  n;  joint swelling[  ];  joint erythema[  ];  joint pain[  ];  back pain[  ];  Heme/Lymph: bruising[  ];  bleeding[  ];  anemia[  ];  Neuro: TIA[  ];  headaches[  ];  stroke[ n ];  vertigo[  ];  seizures[n  ];   paresthesias[  ];  difficulty walking[  ];  Psych:depression[  ]; anxiety[  ];  Endocrine: diabetes[  ];  thyroid dysfunction[  ];  Immunizations: Flu [ n ]; Pneumococcal[n  ];  Other:  Physical Exam: BP 103/69  Pulse 92  Resp 18  Ht 5' (1.524 m)  Wt 120 lb (54.432 kg)  BMI 23.44 kg/m2  SpO2 98%  Wt Readings from Last 3 Encounters:  08/21/12 120 lb (54.432 kg)  07/29/12 116 lb 9.6 oz (52.889 kg)  07/29/12 116 lb 8 oz (52.844 kg)   General appearance: alert, cooperative and no distress Neurologic: intact Heart: regular rate and rhythm, S1, S2 normal, no murmur, click, rub or gallop and normal apical impulse Lungs: clear to auscultation bilaterally and normal percussion bilaterally Abdomen: soft, non-tender;  bowel sounds normal; no masses,  no organomegaly Extremities: extremities normal, atraumatic, no cyanosis or edema and Homans sign is negative, no sign of DVT   Diagnostic Studies & Laboratory data:     Recent Radiology Findings:  RADIOLOGY REPORT*  Clinical Data: Initial treatment strategy for esophageal cancer.  NUCLEAR MEDICINE PET SKULL BASE TO THIGH  Fasting Blood Glucose: 86  Technique: 17.8 mCi F-18 FDG was injected intravenously. CT data  was obtained and used for attenuation correction and anatomic  localization only. (This was not acquired as a diagnostic CT  examination.) Additional exam technical data entered on  technologist  worksheet.  Comparison: CT chest, abdomen and pelvis 06/02/2012.  Findings:  Neck: No hypermetabolic lymph nodes in the neck.  Chest: There is some circumferential thickening of the distal  esophagus which demonstrates profound hypermetabolic activity  (SUVmax = 24.5).No hypermetabolic mediastinal or hilar nodes. No  suspicious pulmonary nodules on the CT scan. Paraseptal  emphysematous changes in the lungs. Heart size is normal.  Abdomen/Pelvis: No abnormal hypermetabolic activity within the  liver, pancreas, adrenal glands, or spleen. No hypermetabolic  lymph nodes in the abdomen or pelvis. The unenhanced appearance of  the liver, gallbladder, pancreas, spleen, bilateral adrenal glands  and bilateral kidneys is unremarkable. There is atherosclerosis in  the abdominal and pelvic vasculature, without evidence of aneurysm.  No ascites or pneumoperitoneum and no pathologic distension of  bowel. Normal appendix. Prostate urinary bladder are unremarkable  in appearance.  [place]: No focal hypermetabolic activity to suggest skeletal  metastasis.  IMPRESSION:  1. Circumferential thickening of the distal esophagus demonstrates  hypermetabolic activity (SUVmax = 24.5)compatible with the reported  esophageal neoplasm. No findings to suggest metastatic disease in  the neck, chest, abdomen or pelvis on today's examination.  2. Additional incidental findings, as above.  Original Report Authenticated By: Florencia Reasons, M.D.   Recent Lab Findings: Lab Results  Component Value Date   WBC 1.6* 07/29/2012   HGB 12.0* 07/29/2012   HCT 34.0* 07/29/2012   PLT 97* 07/29/2012   GLUCOSE 212* 07/14/2012   ALT 25 06/30/2012   AST 21 06/30/2012   NA 138 07/14/2012   K 3.9 07/14/2012   CL 107 07/14/2012   CREATININE 0.9 07/14/2012   BUN 10.0 07/14/2012   CO2 23 07/14/2012      Assessment / Plan:      I've discussed the planned esophageal resection with the patient to his family and interpreter. I  have recommended that we proceed with transhiatal total esophagectomy with cervical esophagogastrostomy pyloroplasty and feeding jejunostomy. The risks and complications of surgery have been discussed in detail and he is willing to proceed.  I had a detailed discussion with Elmon Else regarding the magnitude of the surgical esophagectomy procedure as well as the risks, the expected benefits, and alternatives.  I quoted Elmon Else 5% perioperative mortality rate and a complication rate as high as 40%.  We specifically discussed complications, which include, but were not limited to: recurrent nerve injury with possible permanent hoarseness, anastomotic leak, airway and great vessel injury, conduit ischemia, thoracic duct leak, the inability to complete the operation via a transhiatal approach requiring a right thoracotomy,  bleeding, need for blood transfusion and the potential need for ventilator support.  Dan Scearce has had questions answered is well informed and willing to proceed.  [place] planning to proceed with surgery on October 9 Wednesday.    Delight Ovens MD  Beeper [phone] Office [phone] 08/21/2012 10:12 AM

## 2012-08-24 ENCOUNTER — Encounter (HOSPITAL_COMMUNITY): Payer: Self-pay | Admitting: Pharmacy Technician

## 2012-08-24 ENCOUNTER — Ambulatory Visit (HOSPITAL_COMMUNITY)
Admission: RE | Admit: 2012-08-24 | Discharge: 2012-08-24 | Disposition: A | Discharge status: Home / Self Care | Payer: PRIVATE HEALTH INSURANCE | Source: Ambulatory Visit | Attending: Cardiothoracic Surgery | Admitting: Cardiothoracic Surgery

## 2012-08-24 ENCOUNTER — Encounter (HOSPITAL_COMMUNITY): Payer: Self-pay

## 2012-08-24 ENCOUNTER — Encounter (HOSPITAL_COMMUNITY)
Admission: RE | Admit: 2012-08-24 | Discharge: 2012-08-24 | Disposition: A | Discharge status: Home / Self Care | Payer: PRIVATE HEALTH INSURANCE | Source: Ambulatory Visit | Attending: Cardiothoracic Surgery | Admitting: Cardiothoracic Surgery

## 2012-08-24 VITALS — BP 106/74 | HR 104 | Temp 98.6°F | Resp 20 | Ht 63.0 in | Wt 116.4 lb

## 2012-08-24 DIAGNOSIS — C159 Malignant neoplasm of esophagus, unspecified: Secondary | ICD-10-CM

## 2012-08-24 DIAGNOSIS — Z87891 Personal history of nicotine dependence: Secondary | ICD-10-CM | POA: Insufficient documentation

## 2012-08-24 LAB — CBC
HCT: 39.6 % (ref 39.0–52.0)
Hemoglobin: 13.3 g/dL (ref 13.0–17.0)
MCH: 30.1 pg (ref 26.0–34.0)
MCHC: 33.6 g/dL (ref 30.0–36.0)
MCV: 89.6 fL (ref 78.0–100.0)
Platelets: 129 10*3/uL — ABNORMAL LOW (ref 150–400)
RBC: 4.42 MIL/uL (ref 4.22–5.81)
RDW: 17.3 % — ABNORMAL HIGH (ref 11.5–15.5)
WBC: 5.2 10*3/uL (ref 4.0–10.5)

## 2012-08-24 LAB — URINALYSIS, ROUTINE W REFLEX MICROSCOPIC
Bilirubin Urine: NEGATIVE
Glucose, UA: NEGATIVE mg/dL
Hgb urine dipstick: NEGATIVE
Ketones, ur: NEGATIVE mg/dL
Leukocytes, UA: NEGATIVE
Nitrite: NEGATIVE
Protein, ur: NEGATIVE mg/dL
Specific Gravity, Urine: 1.015 (ref 1.005–1.030)
Urobilinogen, UA: 0.2 mg/dL (ref 0.0–1.0)
pH: 5.5 (ref 5.0–8.0)

## 2012-08-24 LAB — COMPREHENSIVE METABOLIC PANEL
ALT: 56 U/L — ABNORMAL HIGH (ref 0–53)
AST: 52 U/L — ABNORMAL HIGH (ref 0–37)
Albumin: 3.6 g/dL (ref 3.5–5.2)
Alkaline Phosphatase: 111 U/L (ref 39–117)
BUN: 13 mg/dL (ref 6–23)
CO2: 21 mEq/L (ref 19–32)
Calcium: 9.8 mg/dL (ref 8.4–10.5)
Chloride: 103 mEq/L (ref 96–112)
Creatinine, Ser: 0.65 mg/dL (ref 0.50–1.35)
GFR calc Af Amer: >90 mL/min (ref >90)
GFR calc non Af Amer: >90 mL/min (ref >90)
Glucose, Bld: 121 mg/dL — ABNORMAL HIGH (ref 70–99)
Potassium: 3.9 mEq/L (ref 3.5–5.1)
Sodium: 135 mEq/L (ref 135–145)
Total Bilirubin: 0.4 mg/dL (ref 0.3–1.2)
Total Protein: 7.8 g/dL (ref 6.0–8.3)

## 2012-08-24 LAB — TYPE AND SCREEN
ABO/RH(D): O POS
Antibody Screen: NEGATIVE

## 2012-08-24 LAB — BLOOD GAS, ARTERIAL
Acid-base deficit: 0.9 mmol/L (ref 0.0–2.0)
Bicarbonate: 21.8 mEq/L (ref 20.0–24.0)
Drawn by: 344381
O2 Saturation: 97.6 %
Patient temperature: 98.6
TCO2: 22.6 mmol/L (ref 0–100)
pCO2 arterial: 27.6 mmHg — ABNORMAL LOW (ref 35.0–45.0)
pH, Arterial: 7.508 — ABNORMAL HIGH (ref 7.350–7.450)
pO2, Arterial: 89.8 mmHg (ref 80.0–100.0)

## 2012-08-24 LAB — PROTIME-INR
INR: 0.97 (ref 0.00–1.49)
Prothrombin Time: 12.8 seconds (ref 11.6–15.2)

## 2012-08-24 LAB — ABO/RH: ABO/RH(D): O POS

## 2012-08-24 LAB — SURGICAL PCR SCREEN
MRSA, PCR: NEGATIVE
Staphylococcus aureus: NEGATIVE

## 2012-08-24 LAB — APTT: aPTT: 42 seconds — ABNORMAL HIGH (ref 24–37)

## 2012-08-24 NOTE — Pre-Procedure Instructions (Signed)
[name]  08/24/2012   Your procedure is scheduled on:  Wednesday, 08/26/2012@8 :30AM.  Report to [facility] at 6:30 AM.  Call this number if you have problems the morning of surgery: [phone]   Remember:   Do not eat food and/or drink fluids:After Midnight.      Take these medicines the morning of surgery with A SIP OF WATER: Prilosec(Omeprazole)   Do not wear jewelry, make-up or nail polish.  Do not wear lotions, powders, or perfumes. You may not  wear deodorant.  Do not shave 48 hours prior to surgery. Men may shave face and neck.  Do not bring valuables to the hospital.  Contacts, dentures or bridgework may not be worn into surgery.  Leave suitcase in the car. After surgery it may be brought to your room.  For patients admitted to the hospital, checkout time is 11:00 AM the day of discharge.   Patients discharged the day of surgery will not be allowed to drive home.  Name and phone number of your driver: Mai [phone])  Special Instructions: Shower using CHG 2 nights before surgery and the night before surgery.  If you shower the day of surgery use CHG.  Use special wash - you have one bottle of CHG for all showers.  You should use approximately 1/3 of the bottle for each shower.   Please read over the following fact sheets that you were given: Pain Booklet, Coughing and Deep Breathing, Blood Transfusion Information and Surgical Site Infection Prevention

## 2012-08-24 NOTE — Progress Notes (Signed)
Pt here for preadmission, accompanied by Henderson Baltimore  He requests for daughter to be interpreter-release signed. Pt denies heart issues and/or sleep apnea(sleep studies)  Denies having PCP-was seen at urgent care.

## 2012-08-25 ENCOUNTER — Other Ambulatory Visit (HOSPITAL_COMMUNITY): Payer: PRIVATE HEALTH INSURANCE

## 2012-08-25 MED ORDER — DEXTROSE 5 % IV SOLN
1.5000 g | INTRAVENOUS | Status: AC
  Administered 2012-08-26: 1.5 g via INTRAVENOUS
  Filled 2012-08-25: qty 1.5

## 2012-08-26 ENCOUNTER — Encounter (HOSPITAL_COMMUNITY): Payer: Self-pay | Admitting: Anesthesiology

## 2012-08-26 ENCOUNTER — Ambulatory Visit (HOSPITAL_COMMUNITY): Payer: PRIVATE HEALTH INSURANCE | Admitting: Anesthesiology

## 2012-08-26 ENCOUNTER — Inpatient Hospital Stay (HOSPITAL_COMMUNITY)
Admission: RE | Admit: 2012-08-26 | Discharge: 2012-09-07 | DRG: 327 | Disposition: A | Discharge status: Home Health | Payer: PRIVATE HEALTH INSURANCE | Source: Ambulatory Visit | Attending: Cardiothoracic Surgery | Admitting: Cardiothoracic Surgery

## 2012-08-26 ENCOUNTER — Encounter (HOSPITAL_COMMUNITY): Payer: Self-pay | Admitting: Surgery

## 2012-08-26 ENCOUNTER — Encounter (HOSPITAL_COMMUNITY)
Admission: RE | Disposition: A | Discharge status: Home / Self Care | Payer: Self-pay | Source: Ambulatory Visit | Attending: Cardiothoracic Surgery

## 2012-08-26 ENCOUNTER — Inpatient Hospital Stay (HOSPITAL_COMMUNITY): Payer: PRIVATE HEALTH INSURANCE

## 2012-08-26 DIAGNOSIS — R000 Tachycardia, unspecified: Secondary | ICD-10-CM | POA: Diagnosis present

## 2012-08-26 DIAGNOSIS — D62 Acute posthemorrhagic anemia: Secondary | ICD-10-CM | POA: Diagnosis not present

## 2012-08-26 DIAGNOSIS — Z87891 Personal history of nicotine dependence: Secondary | ICD-10-CM

## 2012-08-26 DIAGNOSIS — C159 Malignant neoplasm of esophagus, unspecified: Secondary | ICD-10-CM

## 2012-08-26 DIAGNOSIS — C155 Malignant neoplasm of lower third of esophagus: Principal | ICD-10-CM | POA: Diagnosis present

## 2012-08-26 DIAGNOSIS — Z79899 Other long term (current) drug therapy: Secondary | ICD-10-CM

## 2012-08-26 HISTORY — PX: JEJUNOSTOMY: SHX313

## 2012-08-26 HISTORY — PX: VIDEO BRONCHOSCOPY: SHX5072

## 2012-08-26 HISTORY — PX: PYLOROPLASTY: SHX418

## 2012-08-26 HISTORY — PX: COMPLETE ESOPHAGECTOMY: SHX5286

## 2012-08-26 LAB — BASIC METABOLIC PANEL
BUN: 9 mg/dL (ref 6–23)
CO2: 23 mEq/L (ref 19–32)
Chloride: 104 mEq/L (ref 96–112)
Creatinine, Ser: 0.6 mg/dL (ref 0.50–1.35)
Potassium: 4 mEq/L (ref 3.5–5.1)

## 2012-08-26 LAB — POCT I-STAT 4, (NA,K, GLUC, HGB,HCT)
Glucose, Bld: 150 mg/dL — ABNORMAL HIGH (ref 70–99)
HCT: 36 % — ABNORMAL LOW (ref 39.0–52.0)
Hemoglobin: 12.2 g/dL — ABNORMAL LOW (ref 13.0–17.0)
Potassium: 3.7 meq/L (ref 3.5–5.1)
Sodium: 139 meq/L (ref 135–145)

## 2012-08-26 LAB — POCT I-STAT 3, ART BLOOD GAS (G3+)
Acid-base deficit: 4 mmol/L — ABNORMAL HIGH (ref 0.0–2.0)
Bicarbonate: 21.8 meq/L (ref 20.0–24.0)
O2 Saturation: 99 %
Patient temperature: 35.8
TCO2: 23 mmol/L (ref 0–100)
pCO2 arterial: 38.9 mmHg (ref 35.0–45.0)
pH, Arterial: 7.35 (ref 7.350–7.450)
pO2, Arterial: 164 mmHg — ABNORMAL HIGH (ref 80.0–100.0)

## 2012-08-26 LAB — CBC
HCT: 35.2 % — ABNORMAL LOW (ref 39.0–52.0)
MCV: 88 fL (ref 78.0–100.0)
RBC: 4 MIL/uL — ABNORMAL LOW (ref 4.22–5.81)
WBC: 5.8 10*3/uL (ref 4.0–10.5)

## 2012-08-26 SURGERY — ESOPHAGECTOMY, TOTAL
Anesthesia: General | Wound class: Contaminated

## 2012-08-26 MED ORDER — ALBUMIN HUMAN 5 % IV SOLN
INTRAVENOUS | Status: DC | PRN
  Administered 2012-08-26 (×2): via INTRAVENOUS

## 2012-08-26 MED ORDER — ONDANSETRON HCL 4 MG/2ML IJ SOLN: 4.0000 mg | INTRAMUSCULAR | Status: DC | PRN

## 2012-08-26 MED ORDER — DEXTROSE 5 % IV SOLN
2.0000 g | INTRAVENOUS | Status: AC
  Administered 2012-08-26: 2 g via INTRAVENOUS
  Filled 2012-08-26: qty 2

## 2012-08-26 MED ORDER — DEXTROSE 5 % IV SOLN
2.0000 g | Freq: Four times a day (QID) | INTRAVENOUS | Status: AC
  Administered 2012-08-26 – 2012-08-27 (×4): 2 g via INTRAVENOUS
  Filled 2012-08-26 (×4): qty 2

## 2012-08-26 MED ORDER — DEXTROSE-NACL 5-0.9 % IV SOLN
INTRAVENOUS | Status: DC
  Administered 2012-08-26: 15:00:00 via INTRAVENOUS
  Administered 2012-08-27: 75 mL/h via INTRAVENOUS
  Administered 2012-08-27: 13:00:00 via INTRAVENOUS
  Administered 2012-08-29: 75 mL/h via INTRAVENOUS

## 2012-08-26 MED ORDER — ONDANSETRON HCL 4 MG/2ML IJ SOLN: 4.0000 mg | Freq: Once | INTRAMUSCULAR | Status: DC | PRN

## 2012-08-26 MED ORDER — ROCURONIUM BROMIDE 100 MG/10ML IV SOLN
INTRAVENOUS | Status: DC | PRN
  Administered 2012-08-26: 50 mg via INTRAVENOUS

## 2012-08-26 MED ORDER — SODIUM CHLORIDE 0.9 % IJ SOLN
10.0000 mL | Freq: Two times a day (BID) | INTRAMUSCULAR | Status: DC
  Administered 2012-08-26 – 2012-09-07 (×25): 10 mL via INTRAVENOUS
  Filled 2012-08-26 (×3): qty 20
  Filled 2012-08-26: qty 10
  Filled 2012-08-26: qty 30
  Filled 2012-08-26: qty 20

## 2012-08-26 MED ORDER — MIDAZOLAM HCL 2 MG/2ML IJ SOLN
2.0000 mg | Freq: Once | INTRAMUSCULAR | Status: AC
  Administered 2012-08-26: 2 mg via INTRAVENOUS

## 2012-08-26 MED ORDER — DEXMEDETOMIDINE HCL IN NACL 200 MCG/50ML IV SOLN
0.4000 ug/kg/h | INTRAVENOUS | Status: DC
  Filled 2012-08-26: qty 50

## 2012-08-26 MED ORDER — HYDROMORPHONE HCL PF 1 MG/ML IJ SOLN: 0.2500 mg | INTRAMUSCULAR | Status: DC | PRN

## 2012-08-26 MED ORDER — HEMOSTATIC AGENTS (NO CHARGE) OPTIME
TOPICAL | Status: DC | PRN
  Administered 2012-08-26: 1 via TOPICAL

## 2012-08-26 MED ORDER — DEXMEDETOMIDINE HCL IN NACL 200 MCG/50ML IV SOLN
0.1000 ug/kg/h | INTRAVENOUS | Status: DC
  Administered 2012-08-26: 0.7 ug/kg/h via INTRAVENOUS

## 2012-08-26 MED ORDER — 0.9 % SODIUM CHLORIDE (POUR BTL) OPTIME
TOPICAL | Status: DC | PRN
  Administered 2012-08-26: 2000 mL

## 2012-08-26 MED ORDER — MIDAZOLAM HCL 2 MG/2ML IJ SOLN
INTRAMUSCULAR | Status: AC
  Administered 2012-08-26: 2 mg via INTRAVENOUS
  Filled 2012-08-26: qty 2

## 2012-08-26 MED ORDER — PROPOFOL INFUSION 10 MG/ML OPTIME
INTRAVENOUS | Status: DC | PRN
  Administered 2012-08-26: 25 ug/kg/min via INTRAVENOUS

## 2012-08-26 MED ORDER — FAMOTIDINE IN NACL 20-0.9 MG/50ML-% IV SOLN
20.0000 mg | Freq: Two times a day (BID) | INTRAVENOUS | Status: AC
  Administered 2012-08-26 (×2): 20 mg via INTRAVENOUS
  Filled 2012-08-26 (×2): qty 50

## 2012-08-26 MED ORDER — PROPOFOL 10 MG/ML IV BOLUS
INTRAVENOUS | Status: DC | PRN
  Administered 2012-08-26: 150 mg via INTRAVENOUS

## 2012-08-26 MED ORDER — ALBUTEROL SULFATE (5 MG/ML) 0.5% IN NEBU
2.5000 mg | INHALATION_SOLUTION | Freq: Four times a day (QID) | RESPIRATORY_TRACT | Status: DC
  Administered 2012-08-26 – 2012-08-28 (×8): 2.5 mg via RESPIRATORY_TRACT
  Filled 2012-08-26 (×8): qty 0.5

## 2012-08-26 MED ORDER — ACETAMINOPHEN 10 MG/ML IV SOLN
1000.0000 mg | Freq: Four times a day (QID) | INTRAVENOUS | Status: AC
  Administered 2012-08-26 – 2012-08-27 (×4): 1000 mg via INTRAVENOUS
  Filled 2012-08-26 (×4): qty 100

## 2012-08-26 MED ORDER — POTASSIUM CHLORIDE 10 MEQ/50ML IV SOLN
10.0000 meq | INTRAVENOUS | Status: DC | PRN
  Administered 2012-08-28 – 2012-08-30 (×9): 10 meq via INTRAVENOUS
  Filled 2012-08-26 (×4): qty 50
  Filled 2012-08-26: qty 150
  Filled 2012-08-26 (×2): qty 50
  Filled 2012-08-26: qty 100
  Filled 2012-08-26 (×2): qty 50

## 2012-08-26 MED ORDER — LACTATED RINGERS IV SOLN
INTRAVENOUS | Status: DC | PRN
  Administered 2012-08-26: 08:00:00 via INTRAVENOUS

## 2012-08-26 MED ORDER — ONDANSETRON HCL 4 MG/2ML IJ SOLN
4.0000 mg | Freq: Four times a day (QID) | INTRAMUSCULAR | Status: DC | PRN
  Administered 2012-08-31 – 2012-09-07 (×7): 4 mg via INTRAVENOUS
  Filled 2012-08-26 (×7): qty 2

## 2012-08-26 MED ORDER — ASPIRIN EC 325 MG PO TBEC
325.0000 mg | DELAYED_RELEASE_TABLET | Freq: Every day | ORAL | Status: DC
  Filled 2012-08-26 (×2): qty 1

## 2012-08-26 MED ORDER — PHENYLEPHRINE HCL 10 MG/ML IJ SOLN
30.0000 ug/min | INTRAVENOUS | Status: DC
  Administered 2012-08-26: 10 ug/min via INTRAVENOUS
  Filled 2012-08-26 (×3): qty 1

## 2012-08-26 MED ORDER — PHENYLEPHRINE HCL 10 MG/ML IJ SOLN
10.0000 mg | INTRAVENOUS | Status: DC | PRN
  Administered 2012-08-26: 10 ug/min via INTRAVENOUS

## 2012-08-26 MED ORDER — PANTOPRAZOLE SODIUM 40 MG IV SOLR
40.0000 mg | Freq: Two times a day (BID) | INTRAVENOUS | Status: DC
  Administered 2012-08-26 – 2012-09-07 (×24): 40 mg via INTRAVENOUS
  Filled 2012-08-26 (×27): qty 40

## 2012-08-26 MED ORDER — ESMOLOL HCL 10 MG/ML IV SOLN
INTRAVENOUS | Status: DC | PRN
  Administered 2012-08-26: 10 mg via INTRAVENOUS
  Administered 2012-08-26: 20 mg via INTRAVENOUS
  Administered 2012-08-26: 30 mg via INTRAVENOUS

## 2012-08-26 MED ORDER — MORPHINE SULFATE 2 MG/ML IJ SOLN
1.0000 mg | INTRAMUSCULAR | Status: DC | PRN
  Administered 2012-08-26: 2 mg via INTRAVENOUS
  Filled 2012-08-26 (×2): qty 1

## 2012-08-26 MED ORDER — ASPIRIN 81 MG PO CHEW
324.0000 mg | CHEWABLE_TABLET | Freq: Every day | ORAL | Status: DC
  Administered 2012-08-27: 324 mg
  Filled 2012-08-26: qty 4

## 2012-08-26 MED ORDER — MIDAZOLAM HCL 5 MG/5ML IJ SOLN
INTRAMUSCULAR | Status: DC | PRN
  Administered 2012-08-26 (×2): 2 mg via INTRAVENOUS

## 2012-08-26 MED ORDER — MORPHINE SULFATE 2 MG/ML IJ SOLN
2.0000 mg | INTRAMUSCULAR | Status: DC | PRN
  Administered 2012-08-26 – 2012-08-27 (×4): 2 mg via INTRAVENOUS
  Administered 2012-08-27: 4 mg via INTRAVENOUS
  Administered 2012-08-27 (×2): 2 mg via INTRAVENOUS
  Administered 2012-08-27: 4 mg via INTRAVENOUS
  Administered 2012-08-28 (×2): 2 mg via INTRAVENOUS
  Administered 2012-08-29 – 2012-08-31 (×4): 4 mg via INTRAVENOUS
  Administered 2012-09-01: 2 mg via INTRAVENOUS
  Administered 2012-09-02 (×4): 4 mg via INTRAVENOUS
  Administered 2012-09-02 – 2012-09-04 (×5): 2 mg via INTRAVENOUS
  Filled 2012-08-26 (×2): qty 1
  Filled 2012-08-26: qty 2
  Filled 2012-08-26: qty 1
  Filled 2012-08-26: qty 2
  Filled 2012-08-26 (×3): qty 1
  Filled 2012-08-26: qty 2
  Filled 2012-08-26 (×3): qty 1
  Filled 2012-08-26: qty 2
  Filled 2012-08-26: qty 1
  Filled 2012-08-26 (×2): qty 2
  Filled 2012-08-26: qty 1
  Filled 2012-08-26 (×2): qty 2
  Filled 2012-08-26 (×2): qty 1
  Filled 2012-08-26: qty 2
  Filled 2012-08-26: qty 1
  Filled 2012-08-26: qty 2

## 2012-08-26 MED ORDER — HYDROMORPHONE HCL PF 1 MG/ML IJ SOLN
INTRAMUSCULAR | Status: DC | PRN
  Administered 2012-08-26 (×2): 0.5 mg via INTRAVENOUS

## 2012-08-26 MED ORDER — VECURONIUM BROMIDE 10 MG IV SOLR
INTRAVENOUS | Status: DC | PRN
  Administered 2012-08-26 (×2): 4 mg via INTRAVENOUS
  Administered 2012-08-26: 2 mg via INTRAVENOUS

## 2012-08-26 MED ORDER — DEXTROSE 5 % IV SOLN
2.0000 g | INTRAVENOUS | Status: DC
  Filled 2012-08-26 (×2): qty 2

## 2012-08-26 MED ORDER — MORPHINE SULFATE 2 MG/ML IJ SOLN: 2.0000 mg | INTRAMUSCULAR | Status: DC | PRN

## 2012-08-26 MED ORDER — FENTANYL CITRATE 0.05 MG/ML IJ SOLN
INTRAMUSCULAR | Status: DC | PRN
  Administered 2012-08-26 (×3): 50 ug via INTRAVENOUS
  Administered 2012-08-26: 250 ug via INTRAVENOUS
  Administered 2012-08-26: 100 ug via INTRAVENOUS
  Administered 2012-08-26 (×5): 50 ug via INTRAVENOUS

## 2012-08-26 SURGICAL SUPPLY — 120 items
BANDAGE HEMOSTAT MRDH 4X4 STRL (MISCELLANEOUS) IMPLANT
BNDG HEMOSTAT MRDH 4X4 STRL (MISCELLANEOUS)
BRUSH CYTOL CELLEBRITY 1.5X140 (MISCELLANEOUS) IMPLANT
CANISTER SUCTION 2500CC (MISCELLANEOUS) ×2 IMPLANT
CATH FOLEY 2WAY SLVR 18FR 30CC (CATHETERS) ×2 IMPLANT
CATH FOLEY 2WAY SLVR 30CC 16FR (CATHETERS) IMPLANT
CATH ROBINSON RED A/P 14FR (CATHETERS) IMPLANT
CATH ROBINSON RED A/P 18FR (CATHETERS) ×2 IMPLANT
CATH THORACIC 28FR (CATHETERS) IMPLANT
CATH THORACIC 36FR (CATHETERS) IMPLANT
CATH THORACIC 36FR RT ANG (CATHETERS) IMPLANT
CLIP FOGARTY SPRING 6M (CLIP) ×2 IMPLANT
CLIP TI LARGE 6 (CLIP) ×2 IMPLANT
CLIP TI MEDIUM 24 (CLIP) IMPLANT
CLIP TI WIDE RED SMALL 24 (CLIP) IMPLANT
CLOTH BEACON ORANGE TIMEOUT ST (SAFETY) ×2 IMPLANT
CONT SPEC 4OZ CLIKSEAL STRL BL (MISCELLANEOUS) ×2 IMPLANT
COVER SURGICAL LIGHT HANDLE (MISCELLANEOUS) ×2 IMPLANT
COVER TABLE BACK 60X90 (DRAPES) ×2 IMPLANT
DERMABOND ADVANCED (GAUZE/BANDAGES/DRESSINGS) ×2
DERMABOND ADVANCED .7 DNX12 (GAUZE/BANDAGES/DRESSINGS) ×2 IMPLANT
DRAIN PENROSE 1/2X36 STERILE (WOUND CARE) ×4 IMPLANT
DRAIN SUMP SARATOGA 24F (WOUND CARE) ×2 IMPLANT
DRAPE BILATERAL SPLIT (DRAPES) ×2 IMPLANT
DRAPE CAMERA VIDEO/LASER (DRAPES) ×2 IMPLANT
DRAPE CV SPLIT W-CLR ANES SCRN (DRAPES) ×4 IMPLANT
DRAPE INCISE IOBAN 66X45 STRL (DRAPES) ×6 IMPLANT
DRAPE PROXIMA HALF (DRAPES) IMPLANT
DRAPE SLUSH/WARMER DISC (DRAPES) ×2 IMPLANT
DRAPE STERI WOUND 35X35 8 5/8 (DRAPES) ×2 IMPLANT
DRSG COVADERM 4X14 (GAUZE/BANDAGES/DRESSINGS) ×2 IMPLANT
ELECT BLADE 4.0 EZ CLEAN MEGAD (MISCELLANEOUS) ×2
ELECT BLADE 6.5 EXT (BLADE) ×2 IMPLANT
ELECT NEEDLE TIP 2.8 STRL (NEEDLE) ×2 IMPLANT
ELECT REM PT RETURN 9FT ADLT (ELECTROSURGICAL) ×4
ELECTRODE BLDE 4.0 EZ CLN MEGD (MISCELLANEOUS) ×1 IMPLANT
ELECTRODE REM PT RTRN 9FT ADLT (ELECTROSURGICAL) ×2 IMPLANT
EVACUATOR SILICONE 100CC (DRAIN) IMPLANT
FORCEPS BIOP RJ4 1.8 (CUTTING FORCEPS) IMPLANT
GLOVE BIO SURGEON STRL SZ 6.5 (GLOVE) ×6 IMPLANT
GLOVE BIO SURGEON STRL SZ7.5 (GLOVE) ×2 IMPLANT
GOWN PREVENTION PLUS XLARGE (GOWN DISPOSABLE) ×2 IMPLANT
GOWN STRL NON-REIN LRG LVL3 (GOWN DISPOSABLE) ×8 IMPLANT
HANDLE STAPLE ENDO GIA SHORT (STAPLE) ×1
HEMOSTAT SURGICEL 2X14 (HEMOSTASIS) ×2 IMPLANT
INSERT FOGARTY 61MM (MISCELLANEOUS) IMPLANT
KIT BASIN OR (CUSTOM PROCEDURE TRAY) ×2 IMPLANT
KIT ROOM TURNOVER OR (KITS) ×2 IMPLANT
KIT SUCTION CATH 14FR (SUCTIONS) ×2 IMPLANT
LIGASURE IMPACT 36 18CM CVD LR (INSTRUMENTS) ×2 IMPLANT
LOOP VESSEL MAXI BLUE (MISCELLANEOUS) IMPLANT
LOOP VESSEL MINI RED (MISCELLANEOUS) IMPLANT
MARKER SKIN DUAL TIP RULER LAB (MISCELLANEOUS) ×2 IMPLANT
NEEDLE BIOPSY TRANSBRONCH 21G (NEEDLE) IMPLANT
NS IRRIG 1000ML POUR BTL (IV SOLUTION) ×4 IMPLANT
OIL SILICONE PENTAX (PARTS (SERVICE/REPAIRS)) ×2 IMPLANT
PACK CHEST (CUSTOM PROCEDURE TRAY) ×2 IMPLANT
PAD ARMBOARD 7.5X6 YLW CONV (MISCELLANEOUS) ×4 IMPLANT
PAD SHARPS MAGNETIC DISPOSAL (MISCELLANEOUS) ×2 IMPLANT
PENCIL BUTTON HOLSTER BLD 10FT (ELECTRODE) IMPLANT
PIN SAFETY STERILE (MISCELLANEOUS) IMPLANT
PLUG CATH AND CAP STER (CATHETERS) ×2 IMPLANT
PROBE NERVBE PRASS .33 (MISCELLANEOUS) ×2 IMPLANT
RELOAD EGIA 45 MED/THCK PURPLE (STAPLE) ×6 IMPLANT
RELOAD EGIA TRIS TAN 45 CVD (STAPLE) ×4 IMPLANT
RELOAD STAPLE GIA80 4.8 GRN (STAPLE) ×8 IMPLANT
RETAINER VISCERA MED (MISCELLANEOUS) ×2 IMPLANT
SPECIMEN JAR LARGE (MISCELLANEOUS) IMPLANT
SPONGE GAUZE 4X4 12PLY (GAUZE/BANDAGES/DRESSINGS) ×2 IMPLANT
SPONGE LAP 18X18 X RAY DECT (DISPOSABLE) ×4 IMPLANT
SPONGE TONSIL 1.25 RF SGL STRG (GAUZE/BANDAGES/DRESSINGS) IMPLANT
STAPLER ENDO GIA 12MM SHORT (STAPLE) ×1 IMPLANT
STAPLER RELOAD GIA80-3.8 (STAPLE) ×2 IMPLANT
STAPLER VISISTAT 35W (STAPLE) IMPLANT
SUCTION POOLE TIP (SUCTIONS) IMPLANT
SUT ETHILON 2 0 FS 18 (SUTURE) IMPLANT
SUT ETHILON 3 0 FSL (SUTURE) ×4 IMPLANT
SUT PDS AB 4-0 SH 27 (SUTURE) IMPLANT
SUT PROLENE 0 CT 1 CR/8 (SUTURE) IMPLANT
SUT PROLENE 1 XLH (SUTURE) ×4 IMPLANT
SUT PROLENE 2 0 MH 48 (SUTURE) IMPLANT
SUT PROLENE 2 TP 1 (SUTURE) IMPLANT
SUT PROLENE 3 0 RB 1 (SUTURE) ×2 IMPLANT
SUT PROLENE 4 0 RB 1 (SUTURE)
SUT PROLENE 4-0 RB1 .5 CRCL 36 (SUTURE) IMPLANT
SUT SILK  1 MH (SUTURE) ×2
SUT SILK 1 MH (SUTURE) ×2 IMPLANT
SUT SILK 1 TIES 10X30 (SUTURE) IMPLANT
SUT SILK 2 0 SH CR/8 (SUTURE) ×4 IMPLANT
SUT SILK 2 0SH CR/8 30 (SUTURE) ×2 IMPLANT
SUT SILK 3 0 SH CR/8 (SUTURE) ×2 IMPLANT
SUT SILK 3 0SH CR/8 30 (SUTURE) IMPLANT
SUT SILK 4 0 SH CR/8 (SUTURE) ×4 IMPLANT
SUT VIC AB 1 CTX 27 (SUTURE) IMPLANT
SUT VIC AB 2-0 CT1 18 (SUTURE) ×4 IMPLANT
SUT VIC AB 2-0 CTX 36 (SUTURE) ×4 IMPLANT
SUT VIC AB 3-0 MH 27 (SUTURE) IMPLANT
SUT VIC AB 3-0 SH 18 (SUTURE) IMPLANT
SUT VIC AB 3-0 SH 8-18 (SUTURE) ×4 IMPLANT
SUT VIC AB 3-0 X1 27 (SUTURE) ×4 IMPLANT
SUT VIC AB 4-0 SH 18 (SUTURE) ×6 IMPLANT
SUT VICRYL 2 TP 1 (SUTURE) IMPLANT
SYR 20CC LL (SYRINGE) IMPLANT
SYR 20ML ECCENTRIC (SYRINGE) ×2 IMPLANT
SYR 5ML LUER SLIP (SYRINGE) ×2 IMPLANT
SYR TOOMEY 50ML (SYRINGE) ×2 IMPLANT
SYRINGE 10CC LL (SYRINGE) IMPLANT
SYSTEM SAHARA CHEST DRAIN RE-I (WOUND CARE) ×2 IMPLANT
TAPE UMBILICAL 1/8 X36 TWILL (MISCELLANEOUS) ×2 IMPLANT
TOWEL OR 17X24 6PK STRL BLUE (TOWEL DISPOSABLE) ×4 IMPLANT
TOWEL OR 17X26 10 PK STRL BLUE (TOWEL DISPOSABLE) ×6 IMPLANT
TRAP SPECIMEN MUCOUS 40CC (MISCELLANEOUS) ×2 IMPLANT
TRAY FOLEY CATH 14FRSI W/METER (CATHETERS) ×2 IMPLANT
TUBE CONNECTING 12X1/4 (SUCTIONS) ×2 IMPLANT
TUBE ENDOTRAC EMG 7X10.2 (MISCELLANEOUS) IMPLANT
TUBE ENDOTRAC EMG 8X11.3 (MISCELLANEOUS) IMPLANT
TUBE ENDOTRACH  EMG 6MMTUBE EN (MISCELLANEOUS)
TUBE ENDOTRACH EMG 6MMTUBE EN (MISCELLANEOUS) IMPLANT
WATER STERILE IRR 1000ML POUR (IV SOLUTION) ×2 IMPLANT
YANKAUER SUCT BULB TIP NO VENT (SUCTIONS) ×6 IMPLANT

## 2012-08-26 NOTE — H&P (Signed)
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 He has completed chemotherapy and radiation treatment approximately one month ago. Currently he feels well is taking a diet relatively well and has gained 5 pounds since last seen.      Current Activity/ Functional Status: Patient is independent with mobility/ambulation, transfers, ADL's, IADL's.   Past Medical History  Diagnosis Date  . Esophagitis   . Mass of esophagus 05/28/2012    BX'D DISTAL ESOPHAGUS,PENDING  . Dysphasia     solid and liquid  . Neuropathy     right hand numbness 1 year 2012  . Cancer 05/28/12    bx=esophagus=squamous cell carcinoma    Past Surgical History  Procedure Date  . Esophagogastroduodenoscopy 05/28/12    with biopsy mass distal esophagus ending ge junction  =invasiver squamous cell ca  . Eus 06/11/2012    Procedure: UPPER ENDOSCOPIC ULTRASOUND (EUS) RADIAL;  Surgeon: Rachael Fee, MD;  Location: [facility];  Service: Endoscopy;  Laterality: N/A;    Family History  Problem Relation Age of Onset  . Breast cancer Sister     History   Social History  . Marital Status: Married    Spouse Name: N/A    Number of Children: N/A  . Years  of Education: N/A   Occupational History  . Not on file.   Social History Main Topics  . Smoking status: Former Smoker -- 0.4 packs/day    Types: Cigarettes    Quit date: 06/19/2011  . Smokeless tobacco: Former Neurosurgeon    Quit date: 06/03/2007  . Alcohol Use: 8.4 oz/week    14 Cans of beer per week  . Drug Use: No  . Sexually Active: Not on file   Other Topics Concern  . Not on file   Social History Narrative  . No narrative on file    History  Smoking status  . Former Smoker -- 0.4 packs/day  . Types: Cigarettes  . Quit date: 06/19/2011  Smokeless tobacco  . Former Neurosurgeon  . Quit date: 06/03/2007    History  Alcohol Use  . 8.4 oz/week  . 14 Cans of beer per week     No Known Allergies  Current Facility-Administered Medications  Medication Dose Route Frequency Provider Last Rate Last Dose  . cefUROXime (ZINACEF) 1.5 g in dextrose 5 % 50 mL IVPB  1.5 g Intravenous 60 min Pre-Op Delight Ovens, MD  Review of Systems:     Cardiac Review of Systems: Y or N  Chest Pain [  n ]  Resting SOB [ n  ] Exertional SOB  [ n ]  Orthopnea [ n ]   Pedal Edema [  n ]    Palpitations [ n ] Syncope  [n  ]   Presyncope [   ]  General Review of Systems: [Y] = yes [  ]=no Constitional: recent weight change [ y ]; anorexia [  ]; fatigue [ y ]; nausea [  ]; night sweats [  ]; fever [n  ]; or chills [  n];                                                                                                                                          Dental: poor dentition[ n ]; Last Dentist visit: one month ago  Eye : blurred vision [ n ]; diplopia [   ]; vision changes [ n];  Amaurosis fugax[  ]; Resp: cough [  ];  wheezing[n  ];  hemoptysis[  ]; shortness of breath[n ]; paroxysmal nocturnal dyspnea[ n ]; dyspnea on exertion[  ]; or orthopnea[  ];  GI:  gallstones[ n ], vomiting[ n ];  dysphagia[  ]; melena[  ];  hematochezia [  ]; heartburn[  ];   Hx of  Colonoscopy[  ]; GU: kidney  stones [  ]; hematuria[n  ];   dysuria [  ];  nocturia[  ];  history of     obstruction [  ];             Skin: rash, swelling[  ];, hair loss[  ];  peripheral edema[  ];  or itching[  ]; Musculosketetal: myalgias[  n;  joint swelling[  ];  joint erythema[  ];  joint pain[  ];  back pain[  ];  Heme/Lymph: bruising[  ];  bleeding[  ];  anemia[  ];  Neuro: TIA[  ];  headaches[  ];  stroke[ n ];  vertigo[  ];  seizures[n  ];   paresthesias[  ];  difficulty walking[  ];  Psych:depression[  ]; anxiety[  ];  Endocrine: diabetes[  ];  thyroid dysfunction[  ];  Immunizations: Flu [ n ]; Pneumococcal[n  ];  Other:  Physical Exam: BP 119/78  Pulse 99  Temp 98.2 F (36.8 C) (Oral)  Resp 18  SpO2 99%  Wt Readings from Last 3 Encounters:  08/24/12 116 lb 6.4 oz (52.799 kg)  08/21/12 120 lb (54.432 kg)  07/29/12 116 lb 9.6 oz (52.889 kg)   General appearance: alert, cooperative and no distress Neurologic: intact Heart: regular rate and rhythm, S1, S2 normal, no murmur, click, rub or gallop and normal apical impulse Lungs: clear to auscultation bilaterally and normal percussion bilaterally Abdomen: soft, non-tender; bowel sounds normal; no masses,  no organomegaly  Extremities: extremities normal, atraumatic, no cyanosis or edema and Homans sign is negative, no sign of DVT   Diagnostic Studies & Laboratory data:     Recent Radiology Findings:  RADIOLOGY REPORT*  Clinical Data: Initial treatment strategy for esophageal cancer.  NUCLEAR MEDICINE PET SKULL BASE TO THIGH  Fasting Blood Glucose: 86  Technique: 17.8 mCi F-18 FDG was injected intravenously. CT data  was obtained and used for attenuation correction and anatomic  localization only. (This was not acquired as a diagnostic CT  examination.) Additional exam technical data entered on  technologist worksheet.  Comparison: CT chest, abdomen and pelvis 06/02/2012.  Findings:  Neck: No hypermetabolic lymph nodes in the neck.  Chest:  There is some circumferential thickening of the distal  esophagus which demonstrates profound hypermetabolic activity  (SUVmax = 24.5).No hypermetabolic mediastinal or hilar nodes. No  suspicious pulmonary nodules on the CT scan. Paraseptal  emphysematous changes in the lungs. Heart size is normal.  Abdomen/Pelvis: No abnormal hypermetabolic activity within the  liver, pancreas, adrenal glands, or spleen. No hypermetabolic  lymph nodes in the abdomen or pelvis. The unenhanced appearance of  the liver, gallbladder, pancreas, spleen, bilateral adrenal glands  and bilateral kidneys is unremarkable. There is atherosclerosis in  the abdominal and pelvic vasculature, without evidence of aneurysm.  No ascites or pneumoperitoneum and no pathologic distension of  bowel. Normal appendix. Prostate urinary bladder are unremarkable  in appearance.  [place]: No focal hypermetabolic activity to suggest skeletal  metastasis.  IMPRESSION:  1. Circumferential thickening of the distal esophagus demonstrates  hypermetabolic activity (SUVmax = 24.5)compatible with the reported  esophageal neoplasm. No findings to suggest metastatic disease in  the neck, chest, abdomen or pelvis on today's examination.  2. Additional incidental findings, as above.  Original Report Authenticated By: Florencia Reasons, M.D.   Recent Lab Findings: Lab Results  Component Value Date   WBC 5.2 08/24/2012   HGB 13.3 08/24/2012   HCT 39.6 08/24/2012   PLT 129* 08/24/2012   GLUCOSE 121* 08/24/2012   ALT 56* 08/24/2012   AST 52* 08/24/2012   NA 135 08/24/2012   K 3.9 08/24/2012   CL 103 08/24/2012   CREATININE 0.65 08/24/2012   BUN 13 08/24/2012   CO2 21 08/24/2012   INR 0.97 08/24/2012      Assessment / Plan:      I've discussed the planned esophageal resection with the patient to his family and interpreter. I have recommended that we proceed with transhiatal total esophagectomy with cervical esophagogastrostomy pyloroplasty and  feeding jejunostomy. The risks and complications of surgery have been discussed in detail and he is willing to proceed.  I had a detailed discussion with Bruce [name] regarding the magnitude of the surgical esophagectomy procedure as well as the risks, the expected benefits, and alternatives.  I quoted Bruce [name] 5% perioperative mortality rate and a complication rate as high as 40%.  We specifically discussed complications, which include, but were not limited to: recurrent nerve injury with possible permanent hoarseness, anastomotic leak, airway and great vessel injury, conduit ischemia, thoracic duct leak, the inability to complete the operation via a transhiatal approach requiring a right thoracotomy,  bleeding, need for blood transfusion and the potential need for ventilator support.  Bruce [name] has had questions answered is well informed and willing to proceed.   Delight Ovens MD  Beeper [phone] Office [phone] 08/26/2012 7:03 AM

## 2012-08-26 NOTE — Anesthesia Preprocedure Evaluation (Signed)
Anesthesia Evaluation  Patient identified by MRN, date of birth, ID band Patient awake    Reviewed: Allergy & Precautions, H&P , NPO status , Patient's Chart, lab work & pertinent test results  Airway Mallampati: I TM Distance: >3 FB Neck ROM: full    Dental   Pulmonary          Cardiovascular Rhythm:regular Rate:Normal     Neuro/Psych    GI/Hepatic   Endo/Other    Renal/GU      Musculoskeletal   Abdominal   Peds  Hematology   Anesthesia Other Findings   Reproductive/Obstetrics                           Anesthesia Physical Anesthesia Plan  ASA: II  Anesthesia Plan: General   Post-op Pain Management:    Induction: Intravenous  Airway Management Planned: Double Lumen EBT  Additional Equipment: Arterial line and CVP  Intra-op Plan:   Post-operative Plan: Possible Post-op intubation/ventilation  Informed Consent: I have reviewed the patients History and Physical, chart, labs and discussed the procedure including the risks, benefits and alternatives for the proposed anesthesia with the patient or authorized representative who has indicated his/her understanding and acceptance.     Plan Discussed with: CRNA, Anesthesiologist and Surgeon  Anesthesia Plan Comments:         Anesthesia Quick Evaluation

## 2012-08-26 NOTE — Anesthesia Postprocedure Evaluation (Signed)
  Anesthesia Post-op Note  Patient: Bruce [name]  Procedure(s) Performed: Procedure(s) (LRB) with comments: ESOPHAGECTOMY COMPLETE (N/A) - transhiatal  PYLOROPLASTY (N/A) VIDEO BRONCHOSCOPY (N/A) JEJUNOSTOMY (N/A) - placement of feeding jujunostomy tube  Patient Location: SICU  Anesthesia Type: General  Level of Consciousness: sedated and Patient remains intubated per anesthesia plan  Airway and Oxygen Therapy: Patient remains intubated per anesthesia plan and Patient placed on Ventilator (see vital sign flow sheet for setting)  Post-op Pain: none  Post-op Assessment: Post-op Vital signs reviewed, Patient's Cardiovascular Status Stable, Respiratory Function Stable, Patent Airway, No signs of Nausea or vomiting and Pain level controlled  Post-op Vital Signs: stable  Complications: No apparent anesthesia complications

## 2012-08-26 NOTE — Anesthesia Procedure Notes (Signed)
Procedure Name: Intubation Date/Time: 08/26/2012 8:53 AM Performed by: Gayla Medicus Pre-anesthesia Checklist: Patient identified, Timeout performed, Emergency Drugs available, Suction available and Patient being monitored Patient Re-evaluated:Patient Re-evaluated prior to inductionOxygen Delivery Method: Circle system utilized Preoxygenation: Pre-oxygenation with 100% oxygen Intubation Type: IV induction Ventilation: Mask ventilation without difficulty Laryngoscope Size: Mac and 3 Grade View: Grade I Tube type: Oral Tube size: 8.0 mm Number of attempts: 1 Airway Equipment and Method: Stylet Placement Confirmation: ETT inserted through vocal cords under direct vision,  positive ETCO2 and breath sounds checked- equal and bilateral Tube secured with: Tape Dental Injury: Teeth and Oropharynx as per pre-operative assessment

## 2012-08-26 NOTE — Brief Op Note (Addendum)
08/26/2012  2:00 PM  PATIENT:  Bruce [name]  51 y.o. male  PRE-OPERATIVE DIAGNOSIS:  esophageal cancer  POST-OPERATIVE DIAGNOSIS:  esophageal cancer  PROCEDURE:  Procedure(s) (LRB) with comments: ESOPHAGECTOMY COMPLETE (N/A) - transhiatal  PYLOROPLASTY (N/A) VIDEO BRONCHOSCOPY (N/A) JEJUNOSTOMY (N/A) - placement of feeding jujunostomy tube  SURGEON:  Surgeon(s) and Role:    * Delight Ovens, MD - Primary    * Loreli Slot, MD - Assisting  PHYSICIAN ASSISTANT: GINA COLLINS PA-C, WAYNE GOLD PA-C   ANESTHESIA:   general  EBL:  Total I/O In: 3500 [I.V.:3000; IV Piggyback:500] Out: 800 [Urine:200; Blood:600]  BLOOD ADMINISTERED:none  DRAINS: Penrose drain in the LEFT NECK, 2 Chest Tube(s) in the RIGHT AND LEFT CHEST and Jejunostomy Tube   LOCAL MEDICATIONS USED:  NONE  SPECIMEN:  Source of Specimen:  ESOPHAGOGASTRECTOMY  DISPOSITION OF SPECIMEN:  PATHOLOGY  COUNTS:  NO  ERRORS   DICTATION: .Other Dictation: Dictation Number   PLAN OF CARE: Admit to inpatient   PATIENT DISPOSITION:  PACU - hemodynamically stable.   Delay start of Pharmacological VTE agent (>24hrs) due to surgical blood loss or risk of bleeding: yes

## 2012-08-26 NOTE — Progress Notes (Signed)
Intubated. He is arousable, but then drifts back to sleep  BP 111/77  Pulse 74  Temp 97 F (36.1 C) (Oral)  Resp 18  Wt 116 lb 13.5 oz (53 kg)  SpO2 100%   Intake/Output Summary (Last 24 hours) at 08/26/12 1838 Last data filed at 08/26/12 1800  Gross per 24 hour  Intake 5721.2 ml  Output   1525 ml  Net 4196.2 ml    hct 36 K 3.7  Doing well Extubate when more awake

## 2012-08-26 NOTE — Procedures (Signed)
Extubation Procedure Note  Patient Details:   Name: Bruce [name] DOB: 04-14-1961 MRN: [id number]   Airway Documentation:   Paatient extubated to 4 lpm nasal cannula.  VC 1200 ml, NIF -30, patient able to hold head off bed 10 seconds.  Tolerated well, no complications.  Patient able to breathe around deflated cuff and vocalize post procedure.   Evaluation  O2 sats: stable throughout Complications: No apparent complications Patient did tolerate procedure well. Bilateral Breath Sounds: Clear Suctioning: Airway Yes  Bruce [name], Aloha Gell 08/26/2012, 10:47 PM

## 2012-08-26 NOTE — Preoperative (Signed)
Beta Blockers   Reason not to administer Beta Blockers:Not Applicable 

## 2012-08-26 NOTE — Progress Notes (Signed)
Pt went apneic on wean mode x2.  Pt placed back on full support.  RN and MD aware.

## 2012-08-26 NOTE — Transfer of Care (Signed)
Immediate Anesthesia Transfer of Care Note  Patient: Bruce [name]  Procedure(s) Performed: Procedure(s) (LRB) with comments: ESOPHAGECTOMY COMPLETE (N/A) - transhiatal  PYLOROPLASTY (N/A) VIDEO BRONCHOSCOPY (N/A) JEJUNOSTOMY (N/A) - placement of feeding jujunostomy tube  Patient Location: SICU  Anesthesia Type: General  Level of Consciousness: sedated  Airway & Oxygen Therapy: Patient remains intubated per anesthesia plan  Post-op Assessment: Report given to PACU RN and Post -op Vital signs reviewed and stable  Post vital signs: Reviewed and stable  Complications: No apparent anesthesia complications

## 2012-08-27 ENCOUNTER — Inpatient Hospital Stay (HOSPITAL_COMMUNITY): Payer: PRIVATE HEALTH INSURANCE

## 2012-08-27 LAB — POCT I-STAT 3, ART BLOOD GAS (G3+)
Acid-base deficit: 4 mmol/L — ABNORMAL HIGH (ref 0.0–2.0)
Acid-base deficit: 5 mmol/L — ABNORMAL HIGH (ref 0.0–2.0)
Acid-base deficit: 5 mmol/L — ABNORMAL HIGH (ref 0.0–2.0)
Bicarbonate: 19.3 mEq/L — ABNORMAL LOW (ref 20.0–24.0)
Bicarbonate: 19.4 mEq/L — ABNORMAL LOW (ref 20.0–24.0)
Bicarbonate: 20.7 mEq/L (ref 20.0–24.0)
O2 Saturation: 97 %
O2 Saturation: 98 %
O2 Saturation: 99 %
Patient temperature: 97.7
Patient temperature: 97.7
Patient temperature: 97.7
TCO2: 20 mmol/L (ref 0–100)
TCO2: 20 mmol/L (ref 0–100)
TCO2: 22 mmol/L (ref 0–100)
pCO2 arterial: 33.2 mmHg — ABNORMAL LOW (ref 35.0–45.0)
pCO2 arterial: 33.4 mmHg — ABNORMAL LOW (ref 35.0–45.0)
pCO2 arterial: 35.2 mmHg (ref 35.0–45.0)
pH, Arterial: 7.369 (ref 7.350–7.450)
pH, Arterial: 7.372 (ref 7.350–7.450)
pH, Arterial: 7.375 (ref 7.350–7.450)
pO2, Arterial: 100 mmHg (ref 80.0–100.0)
pO2, Arterial: 145 mmHg — ABNORMAL HIGH (ref 80.0–100.0)
pO2, Arterial: 93 mmHg (ref 80.0–100.0)

## 2012-08-27 LAB — CBC
Hemoglobin: 11.3 g/dL — ABNORMAL LOW (ref 13.0–17.0)
MCH: 30 pg (ref 26.0–34.0)
MCHC: 34.1 g/dL (ref 30.0–36.0)
MCV: 87.8 fL (ref 78.0–100.0)

## 2012-08-27 LAB — BASIC METABOLIC PANEL
BUN: 7 mg/dL (ref 6–23)
Calcium: 8.3 mg/dL — ABNORMAL LOW (ref 8.4–10.5)
Creatinine, Ser: 0.59 mg/dL (ref 0.50–1.35)
GFR calc non Af Amer: >90 mL/min (ref >90)
Glucose, Bld: 229 mg/dL — ABNORMAL HIGH (ref 70–99)
Sodium: 129 mEq/L — ABNORMAL LOW (ref 135–145)

## 2012-08-27 MED ORDER — FENTANYL 10 MCG/ML IV SOLN
INTRAVENOUS | Status: DC
  Administered 2012-08-27: 174.2 ug via INTRAVENOUS
  Administered 2012-08-27: 30 ug via INTRAVENOUS
  Administered 2012-08-27: 23:00:00 via INTRAVENOUS
  Administered 2012-08-27: 210 ug via INTRAVENOUS
  Administered 2012-08-27: 11:00:00 via INTRAVENOUS
  Administered 2012-08-27: 75 ug via INTRAVENOUS
  Administered 2012-08-28: 195 ug via INTRAVENOUS
  Administered 2012-08-28: 90 ug via INTRAVENOUS
  Administered 2012-08-28: 60 ug via INTRAVENOUS
  Administered 2012-08-28: 30 ug via INTRAVENOUS
  Administered 2012-08-28: 210 ug via INTRAVENOUS
  Administered 2012-08-28: 12:00:00 via INTRAVENOUS
  Administered 2012-08-29: 45 ug via INTRAVENOUS
  Administered 2012-08-29: 105 ug via INTRAVENOUS
  Administered 2012-08-29: 45 ug via INTRAVENOUS
  Filled 2012-08-27 (×5): qty 50

## 2012-08-27 MED ORDER — POTASSIUM CHLORIDE 10 MEQ/50ML IV SOLN
10.0000 meq | INTRAVENOUS | Status: AC | PRN
  Administered 2012-08-27 (×3): 10 meq via INTRAVENOUS

## 2012-08-27 MED ORDER — VITAL AF 1.2 CAL PO LIQD
1000.0000 mL | ORAL | Status: DC
  Administered 2012-08-27: 1000 mL
  Filled 2012-08-27 (×3): qty 1000

## 2012-08-27 MED ORDER — DIPHENHYDRAMINE HCL 12.5 MG/5ML PO ELIX
12.5000 mg | ORAL_SOLUTION | Freq: Four times a day (QID) | ORAL | Status: DC | PRN
  Filled 2012-08-27: qty 5

## 2012-08-27 MED ORDER — ONDANSETRON HCL 4 MG/2ML IJ SOLN: 4.0000 mg | Freq: Four times a day (QID) | INTRAMUSCULAR | Status: DC | PRN

## 2012-08-27 MED ORDER — NALOXONE HCL 0.4 MG/ML IJ SOLN: 0.4000 mg | INTRAMUSCULAR | Status: DC | PRN

## 2012-08-27 MED ORDER — ENOXAPARIN SODIUM 30 MG/0.3ML ~~LOC~~ SOLN
30.0000 mg | SUBCUTANEOUS | Status: DC
  Administered 2012-08-27 – 2012-09-07 (×12): 30 mg via SUBCUTANEOUS
  Filled 2012-08-27 (×12): qty 0.3

## 2012-08-27 MED ORDER — SODIUM CHLORIDE 0.9 % IJ SOLN: 9.0000 mL | INTRAMUSCULAR | Status: DC | PRN

## 2012-08-27 MED ORDER — DIPHENHYDRAMINE HCL 50 MG/ML IJ SOLN: 12.5000 mg | Freq: Four times a day (QID) | INTRAMUSCULAR | Status: DC | PRN

## 2012-08-27 NOTE — Progress Notes (Signed)
Patient ID: Bruce [name], male   DOB: 06-26-1961, 51 y.o.   MRN: [id number] TCTS DAILY PROGRESS NOTE                   [address]            Bruce [name] [address]          [phone]      1 Day Post-Op Procedure(s) (LRB): ESOPHAGECTOMY COMPLETE (N/A) PYLOROPLASTY (N/A) VIDEO BRONCHOSCOPY (N/A) JEJUNOSTOMY (N/A)  Total Length of Stay:  LOS: 1 day   Subjective: Awake and alert up in chair  Objective: Vital signs in last 24 hours: Temp:  [97 F (36.1 C)-98.1 F (36.7 C)] 98.1 F (36.7 C) (10/10 0400) Pulse Rate:  [66-146] 111  (10/10 0700) Cardiac Rhythm:  [-] Sinus tachycardia (10/10 0600) Resp:  [10-25] 20  (10/10 0700) BP: (73-124)/(57-88) 115/69 mmHg (10/10 0700) SpO2:  [97 %-100 %] 97 % (10/10 0700) FiO2 (%):  [40 %-60 %] 40 % (10/09 2200) Weight:  [116 lb 13.5 oz (53 kg)-121 lb 0.5 oz (54.9 kg)] 121 lb 0.5 oz (54.9 kg) (10/10 0500)  Filed Weights   08/26/12 1429 08/27/12 0500  Weight: 116 lb 13.5 oz (53 kg) 121 lb 0.5 oz (54.9 kg)    Weight change:    Hemodynamic parameters for last 24 hours:    Intake/Output from previous day: 10/09 0701 - 10/10 0700 In: 8075 [I.V.:6825; NG/GT:60; IV Piggyback:1160] Out: 3405 [Urine:2055; Emesis/NG output:100; Blood:600; Chest Tube:650]  Intake/Output this shift:    Current Meds: Scheduled Meds:   . acetaminophen  1,000 mg Intravenous Q6H  . albuterol  2.5 mg Nebulization Q6H  . aspirin EC  325 mg Oral Daily   Or  . aspirin  324 mg Per Tube Daily  . cefOXitin  2 g Intravenous To OR  . cefOXitin  2 g Intravenous Q6H  . cefUROXime (ZINACEF)  IV  1.5 g Intravenous 60 min Pre-Op  . enoxaparin  30 mg Subcutaneous Q24H  . famotidine (PEPCID) IV  20 mg Intravenous Q12H  . midazolam  2 mg Intravenous Once  . pantoprazole (PROTONIX) IV  40 mg Intravenous Q12H  . sodium chloride  10 mL Intravenous Q12H  . DISCONTD: cefTRIAXone (ROCEPHIN)  IV  2 g Intravenous To OR   Continuous Infusions:   .  dexmedetomidine Stopped (08/26/12 1810)  . dextrose 5 % and 0.9% NaCl 125 mL/hr at 08/26/12 1518  . phenylephrine (NEO-SYNEPHRINE) Adult infusion Stopped (08/27/12 0100)  . DISCONTD: dexmedetomidine     PRN Meds:.morphine injection, ondansetron (ZOFRAN) IV, potassium chloride, potassium chloride, DISCONTD: 0.9 % irrigation (POUR BTL), DISCONTD: hemostatic agents, DISCONTD:  HYDROmorphone (DILAUDID) injection, DISCONTD:  morphine injection, DISCONTD:  morphine injection, DISCONTD: ondansetron (ZOFRAN) IV, DISCONTD: ondansetron (ZOFRAN) IV  General appearance: alert, cooperative and mild distress Neurologic: intact Heart: regular rate and rhythm, S1, S2 normal, no murmur, click, rub or gallop and normal apical impulse Lungs: clear to auscultation bilaterally and normal percussion bilaterally Abdomen: soft, non-tender; bowel sounds normal; no masses,  no organomegaly Extremities: extremities normal, atraumatic, no cyanosis or edema and Homans sign is negative, no sign of DVT Wound: wounds intact, can talk and sing "e" well  Lab Results: CBC: Basename 08/27/12 0425 08/26/12 1455 08/26/12 1445  WBC 8.5 -- 5.8  HGB 11.3* 12.2* --  HCT 33.1* 36.0* --  PLT 127* -- 136*   BMET:  Basename 08/27/12 0425 08/26/12 1455 08/26/12 1445  NA 129* 139 --  K  3.7 3.7 --  CL 100 -- 104  CO2 19 -- 23  GLUCOSE 229* 150* --  BUN 7 -- 9  CREATININE 0.59 -- 0.60  CALCIUM 8.3* -- 8.4    PT/INR:  Basename 08/24/12 1126  LABPROT 12.8  INR 0.97   Radiology: Dg Chest Port 1 View  08/27/2012  *RADIOLOGY REPORT*  Clinical Data: Evaluate chest tube placement.  PORTABLE CHEST - 1 VIEW  Comparison: Chest x-ray 08/26/2012.  Findings: Bilateral chest tubes remain in position with the tips and side port projecting over the thorax.  Nasogastric tube is in position with tip in the proximal stomach and side port near the gastroesophageal junction. Previously noted endotracheal tube has been removed.  There is a  right-sided internal jugular central venous catheter with tip terminating in the distal superior vena cava. Lung volumes are low.  There are bibasilar opacities (left greater than right), favored to represent subsegmental atelectasis. No definite pneumothorax.  Possible trace left pleural effusion. Pulmonary vascular crowding, accentuated by low lung volumes, without frank pulmonary edema.  Heart size is within normal limits. The patient is rotated to the left on today's exam, resulting in distortion of the mediastinal contours and reduced diagnostic sensitivity and specificity for mediastinal pathology.  IMPRESSION: 1.  Support apparatus, as above.  Consider advancement of the nasogastric tube approximately 5-6 cm for more optimal placement. 2.  Probable bibasilar subsegmental atelectasis and small left pleural effusion.  Low lung volumes.   Original Report Authenticated By: Florencia Reasons, M.D.    Dg Chest Portable 1 View  08/26/2012  *RADIOLOGY REPORT*  Clinical Data: Postop esophagectomy.  PORTABLE CHEST - 1 VIEW  Comparison: 08/24/2012.  Findings: Endotracheal tube tip 5 cm above the carina.  Right central line tip proximal superior vena cava level.  Surgical drain left supraclavicular region.  Nasogastric tube with side-hole T11 level.  Distal aspect not imaged.  Bilateral chest tubes in place.  No gross pneumothorax.  Central pulmonary vascular prominence greater on the left.  Prominence of the mediastinum consistent with postoperative changes.  Heart size within normal limits.  Free intraperitoneal air not excluded.  IMPRESSION: Free intraperitoneal air not excluded although this appearance may be related to the stomach .  Various tubes and catheters appear in satisfactory position as noted above.  This has been made a PRA call report utilizing dashboard call feature.   Original Report Authenticated By: Fuller Canada, M.D.      Assessment/Plan: S/P Procedure(s) (LRB): ESOPHAGECTOMY COMPLETE  (N/A) PYLOROPLASTY (N/A) VIDEO BRONCHOSCOPY (N/A) JEJUNOSTOMY (N/A) Mobilize Continue foley due to strict I&O and patient in ICU See progression orders change to pca pump Expected Acute  Blood - loss Anemia Stable post op    Bruce [name] B 08/27/2012 8:28 AM

## 2012-08-27 NOTE — Progress Notes (Signed)
Patient ID: Miquan Tandon, male   DOB: [birth year], 51 y.o.   MRN: [id number]                   [address]            [place] [address]          [phone]     1 Day Post-Op Procedure(s) (LRB): ESOPHAGECTOMY COMPLETE (N/A) PYLOROPLASTY (N/A) VIDEO BRONCHOSCOPY (N/A) JEJUNOSTOMY (N/A)  Total Length of Stay:  LOS: 1 day  BP 124/86  Pulse 105  Temp 98.2 F (36.8 C) (Oral)  Resp 21  Wt 121 lb 0.5 oz (54.9 kg)  SpO2 98%  .Intake/Output      10/09 0701 - 10/10 0700 10/10 0701 - 10/11 0700   I.V. (mL/kg) 6825 (124.3) 1250 (22.8)   Other 30 100   NG/GT 60 60   IV Piggyback 1160 160   Total Intake(mL/kg) 8075 (147.1) 1570 (28.6)   Urine (mL/kg/hr) 2055 (1.6) 1240 (2)   Emesis/NG output 100    Blood 600    Chest Tube 650 140   Total Output 3405 1380   Net +4670 +190             . dexmedetomidine Stopped (08/26/12 1810)  . dextrose 5 % and 0.9% NaCl 125 mL/hr at 08/27/12 1314  . phenylephrine (NEO-SYNEPHRINE) Adult infusion Stopped (08/27/12 0100)     Lab Results  Component Value Date   WBC 8.5 08/27/2012   HGB 11.3* 08/27/2012   HCT 33.1* 08/27/2012   PLT 127* 08/27/2012   GLUCOSE 229* 08/27/2012   ALT 56* 08/24/2012   AST 52* 08/24/2012   NA 129* 08/27/2012   K 3.7 08/27/2012   CL 100 08/27/2012   CREATININE 0.59 08/27/2012   BUN 7 08/27/2012   CO2 19 08/27/2012   INR 0.97 08/24/2012   Stable day Better pain control   Delight Ovens MD  Beeper [phone] Office [phone] 08/27/2012 6:32 PM

## 2012-08-27 NOTE — Progress Notes (Addendum)
INITIAL ADULT NUTRITION ASSESSMENT Date: 08/27/2012   Time: 10:41 AM  INTERVENTION:  As medically appropriate, advance Vital AF 1.2 formula via J-tube to goal rate of 55 ml/hr to provide 1620 total kcals, 99 gm protein, 1071 ml of free water  Recommend monitoring Mg, Phos levels RD to follow for nutrition care plan  DOCUMENTATION CODES Per approved criteria  -Severe malnutrition in the context of chronic illness   Reason for Assessment: Malnutrition Screening Tool Report, New Tube Feeding  ASSESSMENT: Male 51 y.o.  Dx: esophageal cancer  Hx:  Past Medical History  Diagnosis Date  . Esophagitis   . Mass of esophagus 05/28/2012    BX'D DISTAL ESOPHAGUS,PENDING  . Dysphasia     solid and liquid  . Neuropathy     right hand numbness 1 year 2012  . Cancer 05/28/12    bx=esophagus=squamous cell carcinoma    Related Meds:     . acetaminophen  1,000 mg Intravenous Q6H  . albuterol  2.5 mg Nebulization Q6H  . aspirin EC  325 mg Oral Daily   Or  . aspirin  324 mg Per Tube Daily  . cefOXitin  2 g Intravenous To OR  . cefOXitin  2 g Intravenous Q6H  . enoxaparin  30 mg Subcutaneous Q24H  . famotidine (PEPCID) IV  20 mg Intravenous Q12H  . feeding supplement (VITAL AF 1.2 CAL)  1,000 mL Per Tube Q24H  . fentaNYL   Intravenous Q4H  . midazolam  2 mg Intravenous Once  . pantoprazole (PROTONIX) IV  40 mg Intravenous Q12H  . sodium chloride  10 mL Intravenous Q12H  . DISCONTD: cefTRIAXone (ROCEPHIN)  IV  2 g Intravenous To OR    Ht: 5' 0 (152.4 cm)  Wt: 121 lb 0.5 oz (54.9 kg)  Ideal Wt: 48.1 kg % Ideal Wt: 114%  Usual Wt: 140 lb -- per [facility] RD note 06/04/12 % Usual Wt: 86%  BMI = 23.7 kg/m2  Food/Nutrition Related Hx: recent weight lost without trying per admission nutrition screen  Labs:  CMP     Component Value Date/Time   NA 129* 08/27/2012 0425   NA 138 07/14/2012 1138   K 3.7 08/27/2012 0425   K 3.9 07/14/2012 1138   CL 100 08/27/2012 0425   CL 107  07/14/2012 1138   CO2 19 08/27/2012 0425   CO2 23 07/14/2012 1138   GLUCOSE 229* 08/27/2012 0425   GLUCOSE 212* 07/14/2012 1138   BUN 7 08/27/2012 0425   BUN 10.0 07/14/2012 1138   CREATININE 0.59 08/27/2012 0425   CREATININE 0.9 07/14/2012 1138   CREATININE 1.10 04/20/2012 1647   CALCIUM 8.3* 08/27/2012 0425   CALCIUM 9.0 07/14/2012 1138   PROT 7.8 08/24/2012 1126   ALBUMIN 3.6 08/24/2012 1126   AST 52* 08/24/2012 1126   ALT 56* 08/24/2012 1126   ALKPHOS 111 08/24/2012 1126   BILITOT 0.4 08/24/2012 1126   GFRNONAA >90 08/27/2012 0425   GFRAA >90 08/27/2012 0425     Intake/Output Summary (Last 24 hours) at 08/27/12 1048 Last data filed at 08/27/12 0902  Gross per 24 hour  Intake 5924.95 ml  Output   3435 ml  Net 2489.95 ml    Diet Order: NPO  Supplements/Tube Feeding: Vital AF 1.2 at 20 ml/hr via J-tube (not infusing yet)  IVF:    dexmedetomidine Last Rate: Stopped (08/26/12 1810)  dextrose 5 % and 0.9% NaCl Last Rate: 125 mL/hr at 08/26/12 1518  phenylephrine (NEO-SYNEPHRINE) Adult infusion Last Rate:  Stopped (08/27/12 0100)  DISCONTD: dexmedetomidine     Estimated Nutritional Needs:   Kcal: 1600-1800 Protein: 85-100 gm Fluid: 1.6-1.8 L  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  Patient was being followed by [facility] RD as outpatient -- notes reviewed; has lost 19 lbs since July (14%); was consuming a lot of rice soup with chicken & fish in it as well as drinking 1-2 Ensure Clear nutrition supplements.  Patient s/p several procedures 10/9: ESOPHAGECTOMY COMPLETE (N/A) - transhiatal  PYLOROPLASTY (N/A)  VIDEO BRONCHOSCOPY (N/A)  JEJUNOSTOMY (N/A) - placement of feeding jujunostomy tube   Patient meets criteria for severe malnutrition in the context of chronic illness given 14% weight loss x 3 months and severe muscle  loss to upper body (pectoralis & deltoids).  NUTRITION DIAGNOSIS: -Inadequate oral intake (NI-2.1).  Status: Ongoing  RELATED TO: inability to eat, s/p esophagectomy  AS EVIDENCE BY: NPO status  MONITORING/EVALUATION(Goals): Goal: EN to meet >/= 90% of estimated nutrition needs Monitor: EN regimen & tolerance, weight, labs, I/O's  EDUCATION NEEDS: -No education needs identified at this time  Dietitian #: [phone]  Alger Memos 08/27/2012, 10:41 AM

## 2012-08-27 NOTE — Progress Notes (Signed)
Inpatient Diabetes Program Recommendations  AACE/ADA: New Consensus Statement on Inpatient Glycemic Control (2013)  Target Ranges:  Prepandial:   less than 140 mg/dL      Peak postprandial:   less than 180 mg/dL (1-2 hours)      Critically ill patients:  140 - 180 mg/dL   Results for Bruce [name], Bruce [name] [facility] (MRN [id number]) as of 08/27/2012 13:08  Ref. Range 08/26/2012 14:45 08/26/2012 14:55 08/27/2012 04:25  Glucose Latest Range: 70-99 mg/dl 469 (H) 629 (H) 528 (H)    Inpatient Diabetes Program Recommendations Correction (SSI): Please consider monitoring CBGs Q4 hours with Novolog sensitive correction Diet: D5 NS @125ml /hr; will start on Vital 1.2 @ 69ml/hr  Note: Patient is currently on D5NS@125ml /hr and will start on Vital 1.2 @20ml /hr today. Pt has no history of diabetes but since blood glucose has been elevated initially prior to starting tube feeding, pt may benefit from CBG monitoring and Novolog coverage.  Will follow.  Orlando Penner, RN, BSN, CCRN Diabetes Coordinator

## 2012-08-28 ENCOUNTER — Other Ambulatory Visit: Payer: Self-pay

## 2012-08-28 ENCOUNTER — Encounter (HOSPITAL_COMMUNITY): Payer: Self-pay | Admitting: Cardiothoracic Surgery

## 2012-08-28 ENCOUNTER — Inpatient Hospital Stay (HOSPITAL_COMMUNITY): Payer: PRIVATE HEALTH INSURANCE

## 2012-08-28 LAB — CBC
HCT: 31 % — ABNORMAL LOW (ref 39.0–52.0)
Hemoglobin: 10.5 g/dL — ABNORMAL LOW (ref 13.0–17.0)
MCH: 30 pg (ref 26.0–34.0)
MCHC: 33.9 g/dL (ref 30.0–36.0)
MCV: 88.6 fL (ref 78.0–100.0)
Platelets: 132 10*3/uL — ABNORMAL LOW (ref 150–400)
RBC: 3.5 MIL/uL — ABNORMAL LOW (ref 4.22–5.81)
RDW: 17.2 % — ABNORMAL HIGH (ref 11.5–15.5)
WBC: 9.3 10*3/uL (ref 4.0–10.5)

## 2012-08-28 LAB — BASIC METABOLIC PANEL
BUN: 7 mg/dL (ref 6–23)
CO2: 23 mEq/L (ref 19–32)
Calcium: 8.7 mg/dL (ref 8.4–10.5)
Chloride: 108 mEq/L (ref 96–112)
Creatinine, Ser: 0.61 mg/dL (ref 0.50–1.35)
GFR calc Af Amer: >90 mL/min (ref >90)
GFR calc non Af Amer: >90 mL/min (ref >90)
Glucose, Bld: 141 mg/dL — ABNORMAL HIGH (ref 70–99)
Potassium: 3.7 mEq/L (ref 3.5–5.1)
Sodium: 137 mEq/L (ref 135–145)

## 2012-08-28 MED ORDER — VITAL AF 1.2 CAL PO LIQD
1000.0000 mL | ORAL | Status: DC
  Administered 2012-08-29: 1000 mL
  Filled 2012-08-28 (×3): qty 1000

## 2012-08-28 MED ORDER — ALBUTEROL SULFATE (5 MG/ML) 0.5% IN NEBU: 2.5000 mg | INHALATION_SOLUTION | Freq: Four times a day (QID) | RESPIRATORY_TRACT | Status: DC | PRN

## 2012-08-28 MED ORDER — METOPROLOL TARTRATE 25 MG/10 ML ORAL SUSPENSION
12.5000 mg | Freq: Two times a day (BID) | ORAL | Status: DC
  Administered 2012-08-28 – 2012-08-29 (×3): 12.5 mg via ORAL
  Filled 2012-08-28 (×4): qty 5

## 2012-08-28 MED ORDER — METOPROLOL TARTRATE 12.5 MG HALF TABLET
12.5000 mg | ORAL_TABLET | Freq: Two times a day (BID) | ORAL | Status: DC
  Filled 2012-08-28 (×2): qty 1

## 2012-08-28 MED ORDER — ALBUMIN HUMAN 5 % IV SOLN
INTRAVENOUS | Status: AC
  Administered 2012-08-28: 12.5 g
  Filled 2012-08-28: qty 250

## 2012-08-28 NOTE — Progress Notes (Signed)
TCTS BRIEF SICU PROGRESS NOTE  2 Days Post-Op  S/P Procedure(s) (LRB): ESOPHAGECTOMY COMPLETE (N/A) PYLOROPLASTY (N/A) VIDEO BRONCHOSCOPY (N/A) JEJUNOSTOMY (N/A)   Stable day  Plan: Continue current plan  OWEN,CLARENCE H 08/28/2012 6:15 PM

## 2012-08-28 NOTE — Progress Notes (Signed)
Patient ID: Bruce [name], male   DOB: [birth year], 51 y.o.   MRN: [id number] TCTS DAILY PROGRESS NOTE                   [address]            [name] [address]          [phone]      2 Days Post-Op Procedure(s) (LRB): ESOPHAGECTOMY COMPLETE (N/A) PYLOROPLASTY (N/A) VIDEO BRONCHOSCOPY (N/A) JEJUNOSTOMY (N/A)  Total Length of Stay:  LOS: 2 days   Subjective: Better pain control today  Objective: Vital signs in last 24 hours: Temp:  [97.7 F (36.5 C)-98.5 F (36.9 C)] 98.4 F (36.9 C) (10/11 0745) Pulse Rate:  [96-128] 110  (10/11 0700) Cardiac Rhythm:  [-] Sinus tachycardia (10/11 0900) Resp:  [7-26] 7  (10/11 0700) BP: (97-141)/(63-91) 105/82 mmHg (10/11 0700) SpO2:  [93 %-100 %] 98 % (10/11 0832) Weight:  [120 lb 9.5 oz (54.7 kg)] 120 lb 9.5 oz (54.7 kg) (10/11 0100)  Filed Weights   08/26/12 1429 08/27/12 0500 08/28/12 0100  Weight: 116 lb 13.5 oz (53 kg) 121 lb 0.5 oz (54.9 kg) 120 lb 9.5 oz (54.7 kg)    Weight change: 3 lb 12 oz (1.7 kg)   Hemodynamic parameters for last 24 hours:    Intake/Output from previous day: 10/10 0701 - 10/11 0700 In: 2985 [I.V.:2325; NG/GT:80; IV Piggyback:220] Out: 2250 [Urine:1760; Emesis/NG output:150; Chest Tube:340]  Intake/Output this shift: Total I/O In: 50 [IV Piggyback:50] Out: -   Current Meds: Scheduled Meds:   . albuterol  2.5 mg Nebulization Q6H  . aspirin EC  325 mg Oral Daily   Or  . aspirin  324 mg Per Tube Daily  . cefOXitin  2 g Intravenous Q6H  . enoxaparin  30 mg Subcutaneous Q24H  . feeding supplement (VITAL AF 1.2 CAL)  1,000 mL Per Tube Q24H  . fentaNYL   Intravenous Q4H  . pantoprazole (PROTONIX) IV  40 mg Intravenous Q12H  . sodium chloride  10 mL Intravenous Q12H   Continuous Infusions:   . dexmedetomidine Stopped (08/26/12 1810)  . dextrose 5 % and 0.9% NaCl 75 mL/hr (08/27/12 2356)  . phenylephrine (NEO-SYNEPHRINE) Adult infusion Stopped (08/27/12 0100)   PRN  Meds:.diphenhydrAMINE, diphenhydrAMINE, morphine injection, naloxone, ondansetron (ZOFRAN) IV, ondansetron (ZOFRAN) IV, potassium chloride, potassium chloride, sodium chloride  General appearance: alert, cooperative and no distress Neurologic: intact Heart: regular rate and rhythm, S1, S2 normal, no murmur, click, rub or gallop and normal apical impulse Lungs: clear to auscultation bilaterally and normal percussion bilaterally Abdomen: soft, non-tender; bowel sounds normal; no masses,  no organomegaly Extremities: extremities normal, atraumatic, no cyanosis or edema and Homans sign is negative, no sign of DVT Wound: wounds intact  Lab Results: CBC: Basename 08/28/12 0410 08/27/12 0425  WBC 9.3 8.5  HGB 10.5* 11.3*  HCT 31.0* 33.1*  PLT 132* 127*   BMET:  Basename 08/28/12 0410 08/27/12 0425  NA 137 129*  K 3.7 3.7  CL 108 100  CO2 23 19  GLUCOSE 141* 229*  BUN 7 7  CREATININE 0.61 0.59  CALCIUM 8.7 8.3*    PT/INR: No results found for this basename: LABPROT,INR in the last 72 hours Radiology: Dg Chest Port 1 View  08/28/2012  *RADIOLOGY REPORT*  Clinical Data: Esophagectomy  PORTABLE CHEST - 1 VIEW  Comparison: Yesterday  Findings: Tubular structures are stable including chest tubes.  No pneumothorax.  A Penrose drain  in the left neck unchanged.  Stable bibasilar atelectasis.  No edema.  Upper normal heart size.  IMPRESSION: Stable.  Bibasilar atelectasis.  NG tube remains in the proximal fundus.   Original Report Authenticated By: Donavan Burnet, M.D.    Dg Chest Port 1 View  08/27/2012  *RADIOLOGY REPORT*  Clinical Data: Evaluate chest tube placement.  PORTABLE CHEST - 1 VIEW  Comparison: Chest x-ray 08/26/2012.  Findings: Bilateral chest tubes remain in position with the tips and side port projecting over the thorax.  Nasogastric tube is in position with tip in the proximal stomach and side port near the gastroesophageal junction. Previously noted endotracheal tube has been  removed.  There is a right-sided internal jugular central venous catheter with tip terminating in the distal superior vena cava. Lung volumes are low.  There are bibasilar opacities (left greater than right), favored to represent subsegmental atelectasis. No definite pneumothorax.  Possible trace left pleural effusion. Pulmonary vascular crowding, accentuated by low lung volumes, without frank pulmonary edema.  Heart size is within normal limits. The patient is rotated to the left on today's exam, resulting in distortion of the mediastinal contours and reduced diagnostic sensitivity and specificity for mediastinal pathology.  IMPRESSION: 1.  Support apparatus, as above.  Consider advancement of the nasogastric tube approximately 5-6 cm for more optimal placement. 2.  Probable bibasilar subsegmental atelectasis and small left pleural effusion.  Low lung volumes.   Original Report Authenticated By: Florencia Reasons, M.D.    Dg Chest Portable 1 View  08/26/2012  *RADIOLOGY REPORT*  Clinical Data: Postop esophagectomy.  PORTABLE CHEST - 1 VIEW  Comparison: 08/24/2012.  Findings: Endotracheal tube tip 5 cm above the carina.  Right central line tip proximal superior vena cava level.  Surgical drain left supraclavicular region.  Nasogastric tube with side-hole T11 level.  Distal aspect not imaged.  Bilateral chest tubes in place.  No gross pneumothorax.  Central pulmonary vascular prominence greater on the left.  Prominence of the mediastinum consistent with postoperative changes.  Heart size within normal limits.  Free intraperitoneal air not excluded.  IMPRESSION: Free intraperitoneal air not excluded although this appearance may be related to the stomach .  Various tubes and catheters appear in satisfactory position as noted above.  This has been made a PRA call report utilizing dashboard call feature.   Original Report Authenticated By: Fuller Canada, M.D.      Assessment/Plan: S/P Procedure(s)  (LRB): ESOPHAGECTOMY COMPLETE (N/A) PYLOROPLASTY (N/A) VIDEO BRONCHOSCOPY (N/A) JEJUNOSTOMY (N/A) Mobilize Diuresis d/c tubes/lines add low dose lopressor for increased hr Increase tube feeding Remove rt chest tube     Thom Ollinger B 08/28/2012 9:27 AM

## 2012-08-29 ENCOUNTER — Inpatient Hospital Stay (HOSPITAL_COMMUNITY): Payer: PRIVATE HEALTH INSURANCE

## 2012-08-29 LAB — CBC
HCT: 26.7 % — ABNORMAL LOW (ref 39.0–52.0)
Hemoglobin: 9.1 g/dL — ABNORMAL LOW (ref 13.0–17.0)
MCH: 30.2 pg (ref 26.0–34.0)
MCHC: 34.1 g/dL (ref 30.0–36.0)
MCV: 88.7 fL (ref 78.0–100.0)
Platelets: 117 10*3/uL — ABNORMAL LOW (ref 150–400)
RBC: 3.01 MIL/uL — ABNORMAL LOW (ref 4.22–5.81)
RDW: 16.7 % — ABNORMAL HIGH (ref 11.5–15.5)
WBC: 5 10*3/uL (ref 4.0–10.5)

## 2012-08-29 LAB — BASIC METABOLIC PANEL
BUN: 7 mg/dL (ref 6–23)
CO2: 24 mEq/L (ref 19–32)
Calcium: 8.7 mg/dL (ref 8.4–10.5)
Chloride: 105 mEq/L (ref 96–112)
Creatinine, Ser: 0.62 mg/dL (ref 0.50–1.35)
GFR calc Af Amer: >90 mL/min (ref >90)
GFR calc non Af Amer: >90 mL/min (ref >90)
Glucose, Bld: 103 mg/dL — ABNORMAL HIGH (ref 70–99)
Potassium: 3.5 mEq/L (ref 3.5–5.1)
Sodium: 137 mEq/L (ref 135–145)

## 2012-08-29 MED ORDER — METOPROLOL TARTRATE 25 MG/10 ML ORAL SUSPENSION
12.5000 mg | Freq: Two times a day (BID) | ORAL | Status: DC
  Administered 2012-08-29 – 2012-09-06 (×13): 12.5 mg
  Filled 2012-08-29 (×19): qty 5

## 2012-08-29 MED ORDER — POTASSIUM CHLORIDE 10 MEQ/50ML IV SOLN
10.0000 meq | INTRAVENOUS | Status: AC
  Administered 2012-08-29 (×2): 10 meq via INTRAVENOUS

## 2012-08-29 MED ORDER — VITAL AF 1.2 CAL PO LIQD
1000.0000 mL | ORAL | Status: DC
  Administered 2012-08-30: 1000 mL
  Filled 2012-08-29 (×3): qty 1000

## 2012-08-29 NOTE — Progress Notes (Signed)
   CARDIOTHORACIC SURGERY PROGRESS NOTE   R3 Days Post-Op Procedure(s) (LRB): ESOPHAGECTOMY COMPLETE (N/A) PYLOROPLASTY (N/A) VIDEO BRONCHOSCOPY (N/A) JEJUNOSTOMY (N/A)  Subjective: No complaints except didn't sleep last night.  Ambulated around SICU  Objective: Vital signs: BP Readings from Last 1 Encounters:  08/29/12 96/57   Pulse Readings from Last 1 Encounters:  08/29/12 97   Resp Readings from Last 1 Encounters:  08/29/12 19   Temp Readings from Last 1 Encounters:  08/29/12 97.4 F (36.3 C) Oral    Hemodynamics:    Physical Exam:  Rhythm:   sinus  Breath sounds: clear  Heart sounds:  RRR  Incisions:  Clean and dry  Abdomen:  Soft, + BS's but no BM yet  Extremities:  Warm  Chest tube:  Minimal output   Intake/Output from previous day: 10/11 0701 - 10/12 0700 In: 3202.4 [I.V.:1922.4; NG/GT:120; IV Piggyback:460] Out: 2135 [Urine:1485; Emesis/NG output:500; Chest Tube:150] Intake/Output this shift: Total I/O In: 524.5 [I.V.:304.5; Other:120; IV Piggyback:100] Out: 400 [Urine:400]  Lab Results:  [facility] 08/29/12 0418 08/28/12 0410  WBC 5.0 9.3  HGB 9.1* 10.5*  HCT 26.7* 31.0*  PLT 117* 132*   BMET:  Basename 08/29/12 0418 08/28/12 0410  NA 137 137  K 3.5 3.7  CL 105 108  CO2 24 23  GLUCOSE 103* 141*  BUN 7 7  CREATININE 0.62 0.61  CALCIUM 8.7 8.7    CBG (last 3)  No results found for this basename: GLUCAP:3 in the last 72 hours ABG    Component Value Date/Time   PHART 7.372 08/27/2012 0414   HCO3 19.4* 08/27/2012 0414   TCO2 20 08/27/2012 0414   ACIDBASEDEF 5.0* 08/27/2012 0414   O2SAT 97.0 08/27/2012 0414   CXR: *RADIOLOGY REPORT*  Clinical Data: Postop esophagectomy.  PORTABLE CHEST - 1 VIEW  Comparison: Yesterday  Findings: NG tube and right internal jugular vein center venous  catheter stable. Left chest tube at the lung base stable.  Bibasilar atelectasis is stable. Tiny bilateral pneumothoraces are  better visualized.  They are less than 5% bilaterally. Right chest  tube has been removed. A Penrose drain in the left neck stable.  IMPRESSION:  Tiny bilateral apical pneumothoraces are better visualized. Stable  bibasilar atelectasis.  Original Report Authenticated By: Donavan Burnet, M.D.   Assessment/Plan: S/P Procedure(s) (LRB): ESOPHAGECTOMY COMPLETE (N/A) PYLOROPLASTY (N/A) VIDEO BRONCHOSCOPY (N/A) JEJUNOSTOMY (N/A)  Overall doing well POD3 Expected post op acute blood loss anemia, mild, stable Expected post op ileus, improving   Mobilize  D/C left chest tube  Transfer step down  Continue trickle tube feeds until + BM then advance to goal  Bruce [name] 08/29/2012 11:10 AM

## 2012-08-29 NOTE — Progress Notes (Signed)
TCTS BRIEF SICU PROGRESS NOTE  3 Days Post-Op  S/P Procedure(s) (LRB): ESOPHAGECTOMY COMPLETE (N/A) PYLOROPLASTY (N/A) VIDEO BRONCHOSCOPY (N/A) JEJUNOSTOMY (N/A)   Stable day No bed on step down unit for transfer  Plan: Continue current plan  Purcell Nails 08/29/2012 6:37 PM

## 2012-08-30 ENCOUNTER — Inpatient Hospital Stay (HOSPITAL_COMMUNITY): Payer: PRIVATE HEALTH INSURANCE

## 2012-08-30 LAB — BASIC METABOLIC PANEL
BUN: 9 mg/dL (ref 6–23)
Calcium: 9 mg/dL (ref 8.4–10.5)
Creatinine, Ser: 0.7 mg/dL (ref 0.50–1.35)
GFR calc Af Amer: >90 mL/min (ref >90)
GFR calc non Af Amer: >90 mL/min (ref >90)
Glucose, Bld: 95 mg/dL (ref 70–99)

## 2012-08-30 LAB — CBC
MCH: 29.7 pg (ref 26.0–34.0)
MCHC: 33.8 g/dL (ref 30.0–36.0)
Platelets: 138 10*3/uL — ABNORMAL LOW (ref 150–400)
RDW: 16 % — ABNORMAL HIGH (ref 11.5–15.5)

## 2012-08-30 MED ORDER — POTASSIUM CHLORIDE 20 MEQ/15ML (10%) PO LIQD
20.0000 meq | Freq: Two times a day (BID) | ORAL | Status: AC
  Administered 2012-08-30 (×2): 20 meq
  Filled 2012-08-30 (×2): qty 15

## 2012-08-30 MED ORDER — PNEUMOCOCCAL VAC POLYVALENT 25 MCG/0.5ML IJ INJ
0.5000 mL | INJECTION | INTRAMUSCULAR | Status: AC
  Filled 2012-08-30: qty 0.5

## 2012-08-30 MED ORDER — INFLUENZA VIRUS VACC SPLIT PF IM SUSP
0.5000 mL | INTRAMUSCULAR | Status: AC
  Filled 2012-08-30: qty 0.5

## 2012-08-30 MED ORDER — VITAL AF 1.2 CAL PO LIQD
1000.0000 mL | ORAL | Status: DC
  Administered 2012-09-02 – 2012-09-04 (×3): 1000 mL
  Filled 2012-08-30 (×10): qty 1000

## 2012-08-30 NOTE — Progress Notes (Signed)
Wasted 16ml of Fentanyl down drain from PCA, previously D/C'd, witnessed by Zettie Pho

## 2012-08-30 NOTE — Progress Notes (Signed)
   CARDIOTHORACIC SURGERY PROGRESS NOTE   R4 Days Post-Op Procedure(s) (LRB): ESOPHAGECTOMY COMPLETE (N/A) PYLOROPLASTY (N/A) VIDEO BRONCHOSCOPY (N/A) JEJUNOSTOMY (N/A)  Subjective: Looks good.  Ambulating a fair amount.  Minimal pain.  No BM yet but feels hungry.  Objective: Vital signs: BP Readings from Last 1 Encounters:  08/30/12 97/58   Pulse Readings from Last 1 Encounters:  08/29/12 88   Resp Readings from Last 1 Encounters:  08/30/12 16   Temp Readings from Last 1 Encounters:  08/30/12 98.2 F (36.8 C) Oral    Hemodynamics:    Physical Exam:  Rhythm:   sinus  Breath sounds: clear  Heart sounds:  RRR  Incisions:  Clean and dry  Abdomen:  Soft, + BS's  Extremities:  warm   Intake/Output from previous day: 10/12 0701 - 10/13 0700 In: 1449.2 [I.V.:399.2; NG/GT:130; IV Piggyback:200] Out: 3100 [Urine:2600; Emesis/NG output:500] Intake/Output this shift: Total I/O In: 80 [Other:30; IV Piggyback:50] Out: 700 [Urine:600; Emesis/NG output:100]  Lab Results:  Basename 08/30/12 0405 08/29/12 0418  WBC 3.5* 5.0  HGB 9.3* 9.1*  HCT 27.5* 26.7*  PLT 138* 117*   BMET:  Basename 08/30/12 0405 08/29/12 0418  NA 137 137  K 3.4* 3.5  CL 103 105  CO2 26 24  GLUCOSE 95 103*  BUN 9 7  CREATININE 0.70 0.62  CALCIUM 9.0 8.7    CBG (last 3)  No results found for this basename: GLUCAP:3 in the last 72 hours ABG    Component Value Date/Time   PHART 7.372 08/27/2012 0414   HCO3 19.4* 08/27/2012 0414   TCO2 20 08/27/2012 0414   ACIDBASEDEF 5.0* 08/27/2012 0414   O2SAT 97.0 08/27/2012 0414    CXR: *RADIOLOGY REPORT*  Clinical Data: Esophageal carcinoma. Postop from  esophagogastrectomy. Follow-up pneumothorax.  PORTABLE CHEST - 1 VIEW  Comparison: 08/29/2012  Findings: Left chest tube has been removed. No residual  pneumothorax identified. There is mild atelectasis seen in the  left lung base. Previously seen right basilar atelectasis has  resolved.   Penrose drain again seen in the left supraclavicular region and  there is persistent mediastinal widening with mild tracheal  deviation to the right. This is stable. Nasogastric tube and  right jugular center venous catheter remain in appropriate  position.  IMPRESSION:  1. No residual pneumothorax identified.  2. Mild left basilar atelectasis.  Original Report Authenticated By: Danae Orleans, M.D.    Assessment/Plan: S/P Procedure(s) (LRB): ESOPHAGECTOMY COMPLETE (N/A) PYLOROPLASTY (N/A) VIDEO BRONCHOSCOPY (N/A) JEJUNOSTOMY (N/A)  Overall stable Waiting for bed on step down unit for transfer   Increase tube feeds to 50 mL/hr  Possibly d/c NG tomorrow  Bruce [name] H 08/30/2012 10:44 AM

## 2012-08-30 NOTE — Progress Notes (Signed)
Received patient from 2300 and VVS. Patient and family oriented to unit and routines.  Will continue to monitor.

## 2012-08-31 NOTE — Progress Notes (Signed)
Utilization review completed.  

## 2012-08-31 NOTE — Progress Notes (Addendum)
                    [address]            [place] [address]          [phone]     5 Days Post-Op Procedure(s) (LRB): ESOPHAGECTOMY COMPLETE (N/A) PYLOROPLASTY (N/A) VIDEO BRONCHOSCOPY (N/A) JEJUNOSTOMY (N/A)  Subjective: Comfortable, no complaints.  No nausea, some loose stools.  Walked x 2 yesterday. Tolerating TFs.   Objective: Vital signs in last 24 hours: Patient Vitals for the past 24 hrs:  BP Temp Temp src Pulse Resp SpO2  08/31/12 0751 - 98.1 F (36.7 C) Oral - - -  08/31/12 0400 94/66 mmHg 98.5 F (36.9 C) Oral 93  16  98 %  08/31/12 0025 92/55 mmHg 99.3 F (37.4 C) Oral 83  13  97 %  08/30/12 2100 97/65 mmHg 97.5 F (36.4 C) Oral 105  18  100 %  08/30/12 1600 107/74 mmHg 98.3 F (36.8 C) Oral 95  13  100 %  08/30/12 1156 - 97.9 F (36.6 C) Oral - - -   Current Weight  08/30/12 120 lb 5.9 oz (54.6 kg)     Intake/Output from previous day: 10/13 0701 - 10/14 0700 In: 1250 [NG/GT:90; IV Piggyback:50] Out: 2650 [Urine:2050; Emesis/NG output:600]    PHYSICAL EXAM:  Heart: RRR, slightly tachy around 100 Lungs: Clear Wound: Clean and dry, no drainage from L neck Abdomen: soft, NT/ND, +BS    Lab Results: CBC: Basename 08/30/12 0405 08/29/12 0418  WBC 3.5* 5.0  HGB 9.3* 9.1*  HCT 27.5* 26.7*  PLT 138* 117*   BMET:  Basename 08/30/12 0405 08/29/12 0418  NA 137 137  K 3.4* 3.5  CL 103 105  CO2 26 24  GLUCOSE 95 103*  BUN 9 7  CREATININE 0.70 0.62  CALCIUM 9.0 8.7    PT/INR: No results found for this basename: LABPROT,INR in the last 72 hours    Assessment/Plan: S/P Procedure(s) (LRB): ESOPHAGECTOMY COMPLETE (N/A) PYLOROPLASTY (N/A) VIDEO BRONCHOSCOPY (N/A) JEJUNOSTOMY (N/A)  Tolerating TFs.  Continue at current rate.  NG still putting out around 500-600 ml/24 hours. Continue NG for now.  Hopefully can d/c soon.  L neck Penrose not draining. Continue to monitor.  Ambulate, pulm toilet.  F/u labs in am.   LOS: 5 days    COLLINS,GINA H 08/31/2012   will d/c ng today and keep npo for now. I have seen and examined Bruce [name] and agree with the above assessment  and plan.  Delight Ovens MD Beeper [phone] Office [phone] 08/31/2012 10:52 AM

## 2012-08-31 NOTE — Op Note (Signed)
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  SURGICAL PROCEDURE:  Bronchoscopy, transhiatal total esophagectomy with cervical esophagogastrostomy, pyloroplasty, and placement of feeding jejunostomy.  SURGEON:  [name], MD  FIRST ASSISTANT:  [name]. [name], MD  SECOND ASSISTANT:  [name], PA-C  BRIEF HISTORY:  The patient is a 51 year old male from [place] who in 3 months ago began having difficulty swallowing.  Evaluation including endoscopy and esophageal ultrasound and PET scan was performed that suggested a ultrasound staged T3, N1 carcinoma proven by biopsy to the squamous cell, although it was noted to be in the distal esophagus.  The patient underwent course of chemotherapy and radiation and then presented for esophagectomy.  He tolerated his previous treatments well through both of his family and an interpreter.  The risks and options of esophagectomy and benefits of surgical resection were explained to the patient.  He agreed and signed informed consent.  DESCRIPTION OF PROCEDURE:  With central line and arterial line in place, the patient underwent general endotracheal anesthesia without incident. A NIMS endotracheal tube was placed.  Through the endotracheal tube, a fiberoptic bronchoscope was passed to the subsegmental level without evidence of endobronchial lesion.  Specifically, the endotracheal tube was pulled back slightly and the full trachea was examined without evidence of any endobronchial lesion.  The scope was then  removed.  The left neck, chest and abdomen were then prepped with Betadine drape in usual sterile manner.  An upper midline abdominal incision was created and carried down into the abdomen.  Abdomen was explored into the left lobe of the liver with slightly firm possibly related to radiation. There was no obvious tumor spread within the abdomen.  There were no significant enlarged lymph nodes appreciated.  There was one small nodule in the omentum, which was removed, but not thought be malignant. Using the device, the short gastric vessels were divided taking care not to injure the spleen.  With the fundus of the stomach totally freed the spleen, the esophageal hiatus was opened and the esophagus at the GE junction was encircled with a Penrose drain.  Further dissection up into the lower mediastinal was carried out just above the GE junction.  A firm mass of the esophageal tumor was palpable, but was not visibly growing into or through the adventitia of the esophagus.  Through the transhiatal approach, the esophagus was freed this far into the mediastinum as possible.  The lesser sac was entered into the left gastric artery and vein were identified and encircled with a vessel loop.  A vascular bulldog was placed on the left gastric artery with preservation of blood flow along the right gastroepiploic vessel, which was carefully preserved.  Attention was then turned to the left neck  where an incision was made along the anterior border of the sternocleidomastoid muscle and dissection was carried down through the platysma.  The sternocleidomastoid muscle was retracted laterally and the thyroid immediately without use of any rigid retractors.  The carotid was retracted laterally.  The cervical esophagus was identified. As this dissection was carried out, the patient's muscle relaxants had been allowed to wear off and the NIMS stimulator was used to confirm the area of the recurrent nerve.   The cervical esophagus was encircled with a Penrose drain and brought through the diaphragmatic hiatus and throughout the neck.  The remainder of the intrathoracic esophagus was freed up.  The cervical esophagus was then divided at the neck and the specimen delivered to the abdomen.  Using serial firings of the GIA stapler along the lesser curve of the stomach, the esophagus was separated from the stomach and the gastric tube was created.  The left gastric artery and vein were divided with a vascular stapler.  The duodenum was slightly kocherized and there was a good length of stomach and free mobility.  Attention was to pass the stomach into the neck.  A standard Heineke-Mikulicz pyloroplasty performed with dividing the pyloric muscle and re-closing with interrupted 4-0 Vicryls in the inner layer and interrupted 3-0 silk sutures on the outer layer.  Using a Foley catheter and a telescope back after carefully marking the site of the stomach that was to be the anastomosis with the cervical esophagus, the stomach was passed through the posterior mediastinum and into the neck incision.  The previously divided cervical esophagus was then tacked to the anterior surface of the stomach four interrupted silk sutures.  The staple line on the esophagus was divided and a small opening was made in the stomach.  Both mucosal surfaces appeared to have good blood supply.  A Covidien medium thick endostapler Was then passed into the cervical esophagus and into the stomach and the posterior wall of the anastomosis was created in a stapled fashion.  Prior to this, interrupted 4-0 Vicryl sutures were then used to ensure the approximation of the mucosa in the posterior wall between the esophagus and stomach.  The NG tube was then passed into position in the distal stomach and secured in placed by anesthesia.  The anterior portion of the anastomosis was then closed in two layers with interrupted  Vicryl sutures and then interrupted 3-0 silk sutures.  A Penrose drain was placed in anterior and posterior to the anastomosis in the neck and brought out through a separate stab wound.  The neck incision was then closed in layers with interrupted 2-0 Vicryls and a 3-0 subcuticular stitch in the skin edges.  Attention was then turned to the abdomen where the esophageal hiatus was closed to prevent any herniation, but without restricting the stomach.  Through the hiatus, an 18-French red rubber catheter with the tip cuff and additional side holes created was withheld into the proximal jejunum and brought out through a separate site on the left anterior abdominal wall and secured with the sponge and needle count reported as correct.  The abdomen was then closed with #2 running Prolene suture, a running 2-0 Vicryl, and a 3-0 subcuticular stitch.  Both the NG tube and speeding jejunostomy tube were secured in place.  The patient had an estimated blood loss of approximately 300 mL. He was transferred to the Surgical Intensive Care Unit for further postoperative care, still intubated in stable condition, having tolerated the procedure without obvious  complication was noted.  The sponge and needle count was reported as correct at the completion of the procedure.     [name], MD     EG/MEDQ  D:  08/30/2012  T:  08/31/2012  Job:  [id number]

## 2012-09-01 LAB — BASIC METABOLIC PANEL
Calcium: 9.3 mg/dL (ref 8.4–10.5)
GFR calc Af Amer: >90 mL/min (ref >90)
GFR calc non Af Amer: >90 mL/min (ref >90)
Glucose, Bld: 107 mg/dL — ABNORMAL HIGH (ref 70–99)
Potassium: 4 mEq/L (ref 3.5–5.1)
Sodium: 136 mEq/L (ref 135–145)

## 2012-09-01 LAB — CBC
MCH: 31 pg (ref 26.0–34.0)
Platelets: 176 10*3/uL (ref 150–400)
RBC: 3.48 MIL/uL — ABNORMAL LOW (ref 4.22–5.81)
WBC: 4.8 10*3/uL (ref 4.0–10.5)

## 2012-09-01 NOTE — Progress Notes (Signed)
                    [address]            [place] [address]          [phone]     6 Days Post-Op Procedure(s) (LRB): ESOPHAGECTOMY COMPLETE (N/A) PYLOROPLASTY (N/A) VIDEO BRONCHOSCOPY (N/A) JEJUNOSTOMY (N/A)  Subjective: Feels well, no complaints.  No more loose stools.  Denies nausea.  Objective: Vital signs in last 24 hours: Patient Vitals for the past 24 hrs:  BP Temp Temp src Pulse Resp SpO2 Weight  09/01/12 0500 - - - - - - 120 lb 13 oz (54.8 kg)  09/01/12 0300 92/59 mmHg 97.9 F (36.6 C) Oral 96  14  100 % -  08/31/12 2300 93/64 mmHg 98.1 F (36.7 C) Oral 90  12  99 % -  08/31/12 2036 - 99.2 F (37.3 C) Oral - - - -  08/31/12 2033 95/71 mmHg - - 114  16  99 % -  08/31/12 1633 - 99 F (37.2 C) Oral - - - -  08/31/12 1500 108/63 mmHg - - 98  14  100 % -   Current Weight  09/01/12 120 lb 13 oz (54.8 kg)     Intake/Output from previous day: 10/14 0701 - 10/15 0700 In: 1150 [I.V.:20; NG/GT:30] Out: 500 [Urine:500]    PHYSICAL EXAM:  Heart: RRR Lungs: Clear Wound: Clean and dry, scant drainage from L neck Penrose Abdomen: soft, NT/ND, +BS    Lab Results: CBC: Basename 09/01/12 0515 08/30/12 0405  WBC 4.8 3.5*  HGB 10.8* 9.3*  HCT 30.3* 27.5*  PLT 176 138*   BMET:  Basename 09/01/12 0515 08/30/12 0405  NA 136 137  K 4.0 3.4*  CL 100 103  CO2 29 26  GLUCOSE 107* 95  BUN 23 9  CREATININE 0.74 0.70  CALCIUM 9.3 9.0    PT/INR: No results found for this basename: LABPROT,INR in the last 72 hours    Assessment/Plan: S/P Procedure(s) (LRB): ESOPHAGECTOMY COMPLETE (N/A) PYLOROPLASTY (N/A) VIDEO BRONCHOSCOPY (N/A) JEJUNOSTOMY (N/A) Doing well. Continue TFs, NPO for now.  Will need gastrograffin swallow soon, and hopefully will be able to advance diet. Continue ambulation, pulm toilet.   LOS: 6 days    Wiletta Bermingham H 09/01/2012

## 2012-09-02 ENCOUNTER — Inpatient Hospital Stay (HOSPITAL_COMMUNITY): Payer: PRIVATE HEALTH INSURANCE

## 2012-09-02 ENCOUNTER — Ambulatory Visit
Admission: RE | Admit: 2012-09-02 | Payer: PRIVATE HEALTH INSURANCE | Source: Ambulatory Visit | Admitting: Radiation Oncology

## 2012-09-02 MED ORDER — PNEUMOCOCCAL VAC POLYVALENT 25 MCG/0.5ML IJ INJ: 0.5000 mL | INJECTION | INTRAMUSCULAR | Status: DC | PRN

## 2012-09-02 MED ORDER — IOHEXOL 300 MG/ML  SOLN
300.0000 mL | Freq: Once | INTRAMUSCULAR | Status: AC | PRN
  Administered 2012-09-02: 150 mL via ORAL

## 2012-09-02 MED ORDER — INFLUENZA VIRUS VACC SPLIT PF IM SUSP: 0.5000 mL | INTRAMUSCULAR | Status: DC | PRN

## 2012-09-02 MED ORDER — METOCLOPRAMIDE HCL 5 MG/ML IJ SOLN
10.0000 mg | Freq: Four times a day (QID) | INTRAMUSCULAR | Status: AC
  Administered 2012-09-02 – 2012-09-04 (×7): 10 mg via INTRAVENOUS
  Filled 2012-09-02 (×8): qty 2

## 2012-09-02 NOTE — Progress Notes (Signed)
Utilization review completed.  

## 2012-09-02 NOTE — Progress Notes (Addendum)
                    [address]            [place] [address]          [phone]     7 Days Post-Op Procedure(s) (LRB): ESOPHAGECTOMY COMPLETE (N/A) PYLOROPLASTY (N/A) VIDEO BRONCHOSCOPY (N/A) JEJUNOSTOMY (N/A)  Subjective: C/o abdominal discomfort which started once he began taking po's yesterday.  Hurts when he takes liquids.  No nausea or vomiting.    Objective: Vital signs in last 24 hours: Patient Vitals for the past 24 hrs:  BP Temp Temp src Pulse Resp SpO2 Weight  09/02/12 0806 - 98 F (36.7 C) Oral - - - -  09/02/12 0400 96/58 mmHg 97.7 F (36.5 C) Oral 95  18  100 % 109 lb 5.6 oz (49.6 kg)  09/02/12 0008 91/67 mmHg 98.4 F (36.9 C) Oral 90  16  100 % -  09/01/12 2000 102/70 mmHg 98.6 F (37 C) Oral 102  18  95 % -  09/01/12 1620 - 98.5 F (36.9 C) Oral - - - -  09/01/12 1154 108/63 mmHg 98.2 F (36.8 C) Oral - - - -   Current Weight  09/02/12 109 lb 5.6 oz (49.6 kg)     Intake/Output from previous day: 10/15 0701 - 10/16 0700 In: 660 [NG/GT:60] Out: 900 [Urine:900]    PHYSICAL EXAM:  Heart: RRR Lungs: Clear Wound: Clean and dry, no drainage from L neck Abdomen: soft, some tenderness to palpation in the epigastrium, not distended, +BS    Lab Results: CBC: Basename 09/01/12 0515  WBC 4.8  HGB 10.8*  HCT 30.3*  PLT 176   BMET:  Basename 09/01/12 0515  NA 136  K 4.0  CL 100  CO2 29  GLUCOSE 107*  BUN 23  CREATININE 0.74  CALCIUM 9.3    PT/INR: No results found for this basename: LABPROT,INR in the last 72 hours    Assessment/Plan: S/P Procedure(s) (LRB): ESOPHAGECTOMY COMPLETE (N/A) PYLOROPLASTY (N/A) VIDEO BRONCHOSCOPY (N/A) JEJUNOSTOMY (N/A) Continue clears, TFs for now.  May need to add Reglan for abd discomfort. For gastrograffin swallow this am.   LOS: 7 days    COLLINS,GINA H 09/02/2012   crampy abdominal pain this am, better now. Incisions healing well No leak on swallow I have seen and  examined Bruce [name] and agree with the above assessment  and plan.  Delight Ovens MD Beeper [phone] Office [phone] 09/02/2012 8:34 PM

## 2012-09-03 MED ORDER — BOOST / RESOURCE BREEZE PO LIQD
1.0000 | Freq: Three times a day (TID) | ORAL | Status: DC
  Administered 2012-09-03 – 2012-09-06 (×8): 1 via ORAL

## 2012-09-03 NOTE — Progress Notes (Signed)
Pt ambulated around the unit twice and tolerated very well. No assistive device was needed.

## 2012-09-03 NOTE — Progress Notes (Signed)
Nutrition Follow-up  Intervention:    Resource PO TID to maximize oral intake.  Consider changing TF to nocturnal schedule; to meet ~65% of estimated needs recommend Vital AF 1.2 at 75 ml/h 6PM-6AM (x12 hrs) to provide 1080 kcals, 68 gm protein, 730 ml free water daily.  Assessment:   Patient continues on TF via jejunostomy tube with Vital AF 1.2 at 50 ml/h providing 1440 kcals, 90 gm protein, 973 ml free water daily.  Diet Order:  Dysphagia 3 with thin liquids (advanced this morning)  No leak found on barium swallow evaluation yesterday.  PO intake has been poor with clear liquids, RN suspects intake will be better with diet advancement to solid foods.  Receiving Reglan every 6 hours due to abdominal pain with initiation of liquids PO yesterday.  Abdominal pain has improved today.  Meds: Scheduled Meds:   . enoxaparin  30 mg Subcutaneous Q24H  . feeding supplement (VITAL AF 1.2 CAL)  1,000 mL Per Tube Q24H  . metoCLOPramide (REGLAN) injection  10 mg Intravenous Q6H  . metoprolol tartrate  12.5 mg Per Tube BID  . pantoprazole (PROTONIX) IV  40 mg Intravenous Q12H  . sodium chloride  10 mL Intravenous Q12H   Continuous Infusions:   . dextrose 5 % and 0.9% NaCl 1,000 mL (08/29/12 1116)   PRN Meds:.albuterol, influenza  inactive virus vaccine, morphine injection, ondansetron (ZOFRAN) IV, pneumococcal 23 valent vaccine, potassium chloride  Labs:  CMP     Component Value Date/Time   NA 136 09/01/2012 0515   NA 138 07/14/2012 1138   K 4.0 09/01/2012 0515   K 3.9 07/14/2012 1138   CL 100 09/01/2012 0515   CL 107 07/14/2012 1138   CO2 29 09/01/2012 0515   CO2 23 07/14/2012 1138   GLUCOSE 107* 09/01/2012 0515   GLUCOSE 212* 07/14/2012 1138   BUN 23 09/01/2012 0515   BUN 10.0 07/14/2012 1138   CREATININE 0.74 09/01/2012 0515   CREATININE 0.9 07/14/2012 1138   CREATININE 1.10 04/20/2012 1647   CALCIUM 9.3 09/01/2012 0515   CALCIUM 9.0 07/14/2012 1138   PROT 7.8 08/24/2012 1126   ALBUMIN  3.6 08/24/2012 1126   AST 52* 08/24/2012 1126   ALT 56* 08/24/2012 1126   ALKPHOS 111 08/24/2012 1126   BILITOT 0.4 08/24/2012 1126   GFRNONAA >90 09/01/2012 0515   GFRAA >90 09/01/2012 0515     Intake/Output Summary (Last 24 hours) at 09/03/12 1034 Last data filed at 09/03/12 0900  Gross per 24 hour  Intake    930 ml  Output    400 ml  Net    530 ml    Weight Status:  50.4 kg down from 54.9 kg on 10/10 (8% weight loss in one week); BMI=21.7  Estimated needs:  1600-1800 kcals, 85-100 gm protein, 1.6-1.8 liters fluid daily  Nutrition Dx:  Inadequate oral intake, ongoing.  Goal:  Intake to meet >/= 90% of estimated nutrition needs, met.  TF is meeting 90% of estimated calorie needs and 100% of estimated protein needs.  Monitor:  PO intake, labs, weight trend, TF tolerance.   Joaquin Courts, RD, LDN, CNSC Pager# [phone] After Hours Pager# [phone]

## 2012-09-03 NOTE — Progress Notes (Addendum)
[address]            [place] [address]          [phone]     8 Days Post-Op  Procedure(s) (LRB): ESOPHAGECTOMY COMPLETE (N/A) PYLOROPLASTY (N/A) VIDEO BRONCHOSCOPY (N/A) JEJUNOSTOMY (N/A) Subjective: No new complaints  Objective   Temp:  [97.5 F (36.4 C)-98.4 F (36.9 C)] 97.8 F (36.6 C) (10/17 0751) Pulse Rate:  [83-103] 86  (10/17 0751) Resp:  [13-17] 13  (10/17 0418) BP: (91-118)/(55-84) 91/57 mmHg (10/17 0751) SpO2:  [97 %-100 %] 100 % (10/17 0751) Weight:  [111 lb 1.8 oz (50.4 kg)] 111 lb 1.8 oz (50.4 kg) (10/17 0500)   Intake/Output Summary (Last 24 hours) at 09/03/12 0924 Last data filed at 09/03/12 0900  Gross per 24 hour  Intake    980 ml  Output    400 ml  Net    580 ml       General appearance: alert, cooperative and no distress Heart: regular rate and rhythm Lungs: min dim in right base Abdomen: soft, nondist, + BS Wound: incisions healing well  Lab Results:  Basename 09/01/12 0515  NA 136  K 4.0  CL 100  CO2 29  GLUCOSE 107*  BUN 23  CREATININE 0.74  CALCIUM 9.3  MG --  PHOS --   No results found for this basename: AST:2,ALT:2,ALKPHOS:2,BILITOT:2,PROT:2,ALBUMIN:2 in the last 72 hours No results found for this basename: LIPASE:2,AMYLASE:2 in the last 72 hours  Basename 09/01/12 0515  WBC 4.8  NEUTROABS --  HGB 10.8*  HCT 30.3*  MCV 87.1  PLT 176   No results found for this basename: CKTOTAL:4,CKMB:4,TROPONINI:4 in the last 72 hours No components found with this basename: POCBNP:3 No results found for this basename: DDIMER in the last 72 hours No results found for this basename: HGBA1C in the last 72 hours No results found for this basename: CHOL,HDL,LDLCALC,TRIG,CHOLHDL in the last 72 hours No results found for this basename: TSH,T4TOTAL,FREET3,T3FREE,THYROIDAB in the last 72 hours No results found for this basename: VITAMINB12,FOLATE,FERRITIN,TIBC,IRON,RETICCTPCT in the last 72  hours  Medications: Scheduled    . enoxaparin  30 mg Subcutaneous Q24H  . feeding supplement (VITAL AF 1.2 CAL)  1,000 mL Per Tube Q24H  . metoCLOPramide (REGLAN) injection  10 mg Intravenous Q6H  . metoprolol tartrate  12.5 mg Per Tube BID  . pantoprazole (PROTONIX) IV  40 mg Intravenous Q12H  . sodium chloride  10 mL Intravenous Q12H     Radiology/Studies:  Dg Esophagus W/water Sol Cm  09/02/2012  *RADIOLOGY REPORT*  Clinical Data: Esophageal cancer.  Prior esophagectomy.  ESOPHOGRAM/BARIUM SWALLOW  Technique:  Single contrast examination was performed using water- soluble contrast.  Fluoroscopy time:  2.22 minutes.  Comparison:  None  Findings:  Fluoroscopic evaluation of swallowing was performed with water soluble contrast.  This demonstrates changes of gastric pull- through.  No visible leak at the anastomosis near the thoracic inlet.  No obstruction.  IMPRESSION: No visible anastomotic leak.   Original Report Authenticated By: Cyndie Chime, M.D.     INR: Will add last result for INR, ABG once components are confirmed Will add last 4 CBG results once components are confirmed  Assessment/Plan: S/P Procedure(s) (LRB): ESOPHAGECTOMY COMPLETE (N/A) PYLOROPLASTY (N/A) VIDEO BRONCHOSCOPY (N/A) JEJUNOSTOMY (N/A)  1. advanced penrose approx 5 cm... Some purulent drainage 2 no leak on esophogram- slowly advance  diet 3 tx to ptcu    LOS: 8 days    [name],Bruce E 10/17/20139:24 AM   I have seen and examined Bruce [name] and agree with the above assessment  and plan.  Delight Ovens MD Beeper [phone] Office [phone] 09/03/2012 10:34 PM

## 2012-09-03 NOTE — Progress Notes (Signed)
09/03/2012 11:12 AM Bruce [name]  [id number]  Report called to Hope,RN on 2000. VSS. Transferred with belongings to new room,2018. Family at bedside.  [name] [name] 10/17/201311:12 AM

## 2012-09-04 NOTE — Progress Notes (Signed)
Pt ambulated approx 1000 feet, no assistance needed. Pt is walking very stable.

## 2012-09-04 NOTE — Progress Notes (Addendum)
                    [address]            [place] [address]          [phone]     9 Days Post-Op Procedure(s) (LRB): ESOPHAGECTOMY COMPLETE (N/A) PYLOROPLASTY (N/A) VIDEO BRONCHOSCOPY (N/A) JEJUNOSTOMY (N/A)  Subjective: Eating breakfast.  Appetite improving.  No more abdominal pain, no nausea.   Objective: Vital signs in last 24 hours: Patient Vitals for the past 24 hrs:  BP Temp Temp src Pulse Resp SpO2 Weight  09/04/12 0652 94/61 mmHg - - 92  - - -  09/04/12 0614 86/61 mmHg 98.5 F (36.9 C) Oral 82  18  98 % 111 lb (50.349 kg)  09/03/12 2213 97/66 mmHg - Oral 105  17  99 % -  09/03/12 1206 98/66 mmHg 98.1 F (36.7 C) - 98  - 100 % -  09/03/12 1159 97/67 mmHg - - 96  - 100 % -  09/03/12 0918 95/61 mmHg - - 99  - - -   Current Weight  09/04/12 111 lb (50.349 kg)     Intake/Output from previous day: 10/17 0701 - 10/18 0700 In: 990 [P.O.:240; NG/GT:50] Out: 200 [Urine:200]    PHYSICAL EXAM:  Heart: RRR Lungs: Clear Wound: Clean and dry, no drainage from L neck Penrose Abdomen: soft, NT/ND, +BS    Lab Results: CBC:No results found for this basename: WBC:2,HGB:2,HCT:2,PLT:2 in the last 72 hours BMET: No results found for this basename: NA:2,K:2,CL:2,CO2:2,GLUCOSE:2,BUN:2,CREATININE:2,CALCIUM:2 in the last 72 hours  PT/INR: No results found for this basename: LABPROT,INR in the last 72 hours    Assessment/Plan: S/P Procedure(s) (LRB): ESOPHAGECTOMY COMPLETE (N/A) PYLOROPLASTY (N/A) VIDEO BRONCHOSCOPY (N/A) JEJUNOSTOMY (N/A) Doing well, tolerating diet. Continue TFs for now until po intake adequate. L neck Penrose advanced further. Continue ambulation, pulm toilet.    LOS: 9 days    COLLINS,GINA H 09/04/2012   some cramping after eating Bowels are moving Wounds are healing well May need night time tube feeding at d/c I have seen and examined Elmon Else and agree with the above assessment  and plan.  Delight Ovens  MD Beeper [phone] Office [phone] 09/04/2012 2:44 PM

## 2012-09-05 MED ORDER — ZOLPIDEM TARTRATE 5 MG PO TABS
10.0000 mg | ORAL_TABLET | Freq: Every evening | ORAL | Status: DC | PRN
  Administered 2012-09-05 – 2012-09-06 (×2): 10 mg via ORAL
  Filled 2012-09-05: qty 1
  Filled 2012-09-05: qty 2
  Filled 2012-09-05: qty 1

## 2012-09-05 MED ORDER — VITAL AF 1.2 CAL PO LIQD
1000.0000 mL | ORAL | Status: DC
  Administered 2012-09-05 – 2012-09-06 (×2): 1000 mL
  Filled 2012-09-05 (×3): qty 1000

## 2012-09-05 NOTE — Progress Notes (Addendum)
                   [address]            [place] [address]          [phone]    10 Days Post-Op Procedure(s) (LRB): ESOPHAGECTOMY COMPLETE (N/A) PYLOROPLASTY (N/A) VIDEO BRONCHOSCOPY (N/A) JEJUNOSTOMY (N/A)  Subjective: Daughter at bedside. States he has no nausea or emesis. Does get some crampy abdominal pain after eating.  Objective: Vital signs in last 24 hours: Patient Vitals for the past 24 hrs:  BP Temp Temp src Pulse Resp SpO2 Weight  09/05/12 0435 88/55 mmHg 98.9 F (37.2 C) Oral 97  16  97 % 111 lb 11.2 oz (50.667 kg)  09/04/12 2056 106/68 mmHg 98 F (36.7 C) Oral 85  18  100 % -  09/04/12 1300 101/68 mmHg 98.7 F (37.1 C) Oral 97  16  100 % -     Intake/Output from previous day: 10/18 0701 - 10/19 0700 In: 240 [P.O.:240] Out: -    Physical Exam:  Cardiovascular: RRR Pulmonary: Clear to auscultation bilaterally; no rales, wheezes, or rhonchi. Abdomen: Soft, non tender, bowel sounds present. Extremities: No lower extremity edema. Wounds: Clean and dry.  No erythema or signs of infection. Penrose drain was lying inside dressing as was completley out this am.  Lab Results: CBC:No results found for this basename: WBC:2,HGB:2,HCT:2,PLT:2 in the last 72 hours BMET: No results found for this basename: NA:2,K:2,CL:2,CO2:2,GLUCOSE:2,BUN:2,CREATININE:2,CALCIUM:2 in the last 72 hours  PT/INR: No results found for this basename: LABPROT,INR in the last 72 hours ABG:  INR: Will add last result for INR, ABG once components are confirmed Will add last 4 CBG results once components are confirmed  Assessment/Plan:  1. CV - SR. 2.  Pulmonary - Encourage incentive spirometer. 3.GI-still on TFs at 50 cc/hr.May need at night only  upon discharge. 4.Continue current management  ZIMMERMAN,DONIELLE MPA-C 09/05/2012,8:18 AM   Patient seen and examined. Agree with above He's not eating very well- will change to TF from 7P to 7A and see if he's able  to eat a little more Needs to ambulate more

## 2012-09-06 NOTE — Progress Notes (Addendum)
                   [address]            [place] [address]          [phone]    11 Days Post-Op Procedure(s) (LRB): ESOPHAGECTOMY COMPLETE (N/A) PYLOROPLASTY (N/A) VIDEO BRONCHOSCOPY (N/A) JEJUNOSTOMY (N/A)  Subjective: Daughter at bedside. States he has not had crampy abdominal pain since Friday. He does have occasional nausea and daughter said patient thinks it is the food here. He wants [place] food.  Objective: Vital signs in last 24 hours: Patient Vitals for the past 24 hrs:  BP Temp Temp src Pulse Resp SpO2 Weight  09/06/12 0545 94/61 mmHg 98 F (36.7 C) Oral 96  16  100 % 112 lb 3.4 oz (50.9 kg)  09/05/12 2029 94/60 mmHg 98.1 F (36.7 C) Oral 90  18  100 % -  09/05/12 1427 103/67 mmHg 98.6 F (37 C) Oral 98  16  99 % -     Intake/Output from previous day: 10/19 0701 - 10/20 0700 In: 240 [P.O.:240] Out: -    Physical Exam:  Cardiovascular: RRR Pulmonary: Clear to auscultation bilaterally; no rales, wheezes, or rhonchi. Abdomen: Soft, non tender, bowel sounds present. Extremities: No lower extremity edema. Wounds: Clean and dry.  No erythema or signs of infection.   Lab Results: CBC:No results found for this basename: WBC:2,HGB:2,HCT:2,PLT:2 in the last 72 hours BMET: No results found for this basename: NA:2,K:2,CL:2,CO2:2,GLUCOSE:2,BUN:2,CREATININE:2,CALCIUM:2 in the last 72 hours  PT/INR: No results found for this basename: LABPROT,INR in the last 72 hours ABG:  INR: Will add last result for INR, ABG once components are confirmed Will add last 4 CBG results once components are confirmed  Assessment/Plan:  1. CV - ST/SR. On Lopressor with parameters. 2.  Pulmonary - Encourage incentive spirometer. 3.GI-still on TFs at 50 cc/hr from 7p pm to 7 am. Zofran for nausea PRN. I told the daughter it was ok for him to have [place] food as long as it is liquid or soft.May benefit from Reglan.   ZIMMERMAN,DONIELLE  MPA-C 09/06/2012,8:01 AM   Patient seen and examined. Patient and daughter now tell me his stomach doesn't hurt any more. + BM Hopefully home in AM

## 2012-09-07 MED ORDER — HYDROCODONE-ACETAMINOPHEN 7.5-500 MG/15ML PO SOLN: 10.0000 mL | ORAL | Status: DC | PRN

## 2012-09-07 MED ORDER — BOOST / RESOURCE BREEZE PO LIQD: 1.0000 | Freq: Three times a day (TID) | ORAL | Status: DC

## 2012-09-07 MED ORDER — METOPROLOL TARTRATE 12.5 MG HALF TABLET: 12.5000 mg | ORAL_TABLET | Freq: Two times a day (BID) | ORAL | Status: DC

## 2012-09-07 MED ORDER — VITAL AF 1.2 CAL PO LIQD: ORAL | Status: DC

## 2012-09-07 MED ORDER — METOPROLOL TARTRATE 12.5 MG HALF TABLET
12.5000 mg | ORAL_TABLET | Freq: Two times a day (BID) | ORAL | Status: DC
  Filled 2012-09-07 (×2): qty 1

## 2012-09-07 NOTE — Care Management Note (Signed)
    Page 1 of 2   09/07/2012     2:02:22 PM   CARE MANAGEMENT NOTE 09/07/2012  Patient:  Bruce [name], Bruce [name]   Account Number:  [id number]  Date Initiated:  08/27/2012  Documentation initiated by:  [facility]  Subjective/Objective Assessment:   Post op esophagectomy for ca     Action/Plan:   Anticipated DC Date:  09/07/2012   Anticipated DC Plan:  HOME W HOME HEALTH SERVICES      DC Planning Services  CM consult      Choice offered to / List presented to:  C-4 Adult Children        HH arranged  HH-1 RN      [facility] agency  [facility]   Status of service:  Completed, signed off Medicare Important Message given?   (If response is "NO", the following Medicare IM given date fields will be blank) Date Medicare IM given:   Date Additional Medicare IM given:    Discharge Disposition:  HOME W HOME HEALTH SERVICES  Per UR Regulation:  Reviewed for med. necessity/level of care/duration of stay  If discussed at Long Length of Stay Meetings, dates discussed:   09/01/2012  09/03/2012    Comments:  ContactMariana, Goytia Daughter [phone]   [phone]  09-07-12 10:15am Avie Arenas, RNBSN [phone] Discharging today with tube feeding.  Will need HH RN. Talked with daughter in room.  [facility] set up to follow. Verified Address and phone number.  09/01/12- 1200- Donn Pierini RN, BSN [phone] 6 days post op - cont. NPO for now, tolerating TF- will need gastrograffin swallow soon per MD notes- NCM to follow for d/c planning.

## 2012-09-07 NOTE — Progress Notes (Addendum)
                    [address]            [place], [address]          [phone]     12 Days Post-Op Procedure(s) (LRB): ESOPHAGECTOMY COMPLETE (N/A) PYLOROPLASTY (N/A) VIDEO BRONCHOSCOPY (N/A) JEJUNOSTOMY (N/A)  Subjective: Feels well overall.  No more abdominal pain.  Daughter reports that for the last 2 mornings around 0500, he has vomited x 1, then immediately feels better.  No nausea.  Eating better since family has brought in food. +BM.   Objective: Vital signs in last 24 hours: Patient Vitals for the past 24 hrs:  BP Temp Temp src Pulse Resp SpO2 Weight  09/07/12 0542 92/66 mmHg 98.1 F (36.7 C) Oral 90  18  99 % 114 lb 6.4 oz (51.891 kg)  09/06/12 2129 117/86 mmHg 97.6 F (36.4 C) Oral 111  18  100 % -  09/06/12 1525 104/70 mmHg - - - - - -  09/06/12 1326 81/48 mmHg 98.8 F (37.1 C) Oral 108  16  99 % -   Current Weight  09/07/12 114 lb 6.4 oz (51.891 kg)     Intake/Output from previous day: 10/20 0701 - 10/21 0700 In: 120 [P.O.:120] Out: -     PHYSICAL EXAM:  Heart: RRR Lungs: Clear Wound: Clean and dry Abdomen: soft, NT/ND, +BS    Lab Results: CBC:No results found for this basename: WBC:2,HGB:2,HCT:2,PLT:2 in the last 72 hours BMET: No results found for this basename: NA:2,K:2,CL:2,CO2:2,GLUCOSE:2,BUN:2,CREATININE:2,CALCIUM:2 in the last 72 hours  PT/INR: No results found for this basename: LABPROT,INR in the last 72 hours    Assessment/Plan: S/P Procedure(s) (LRB): ESOPHAGECTOMY COMPLETE (N/A) PYLOROPLASTY (N/A) VIDEO BRONCHOSCOPY (N/A) JEJUNOSTOMY (N/A) Po intake improving, however he has had early am emesis since the TFs were changed to nocturnal.  May need to decrease TF rate. Overall progressing well.  Hopefully home soon.  Will arrange HHRN, etc.   LOS: 12 days    COLLINS,GINA H 09/07/2012   plan d/c today if home arrangements made for night tube feeding and po diet in day hours.  I have seen and examined  Bruce [name] and agree with the above assessment  and plan.  Delight Ovens MD Beeper [phone] Office [phone] 09/07/2012 10:58 AM

## 2012-09-07 NOTE — Discharge Summary (Signed)
[address]            [name] [address]          [phone]         Discharge Summary  Name: Bruce [name] DOB: 09/05/1961 51 y.o. 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 Esophagitis   . Mass of esophagus 05/28/2012    BX'D DISTAL ESOPHAGUS,PENDING  . Dysphasia     solid and liquid  . Neuropathy     right hand numbness 1 year 2012  . Cancer 05/28/12    bx=esophagus=squamous cell carcinoma     Procedures:  TRANSHIATAL TOTAL ESOPHAGECTOMY WITH CERVICAL ESOPHAGOGASTROSTOMY, PYLOROPLASTY, FEEDING JEJUNOSTOMY, VIDEO BRONCHOSCOPY - 08/26/2012    HPI:  The patient is a 51 y.o. [place] male who initially presented in July 2013 with a three month history of episodic vomiting, difficulty swallowing and pain on swallowing. He underwent evaluation including GI endoscopy and EUS which demonstrated a squamous cell carcinoma in the distal esophagus, stage (by EUS) uT3N1. He was seen in consultation at that time by Dr. Tyrone Sage for an evaluation for possible esophagectomy.  It was felt that he was a good surgical candidate.  He was treated with chemotherapy and radiation treatments prior to consideration of surgical resection. He recently completed his treatments and was re-evaluated for surgery on 08/21/2012.  At that time, he was feeling well, eating better, and had gained 5 pounds, and was felt to be ready to proceed with esophagectomy.  All risks, benefits and alternatives of surgery were explained in detail, and the patient agreed to proceed.    Hospital Course:  The patient was admitted to [facility] on 08/26/2012. The patient was taken to the  operating room and underwent the above procedure.    The postoperative course was generally uneventful.  He had some initial tachycardia and was started on a beta blocker.  He was started on tube feeds for nutritional support.  He was transferred from the ICU to stepdown on post-op day 4.  A gastrograffin swallow study was performed on post-op day 7 and showed no evidence of anastomotic leak.  He was started on a clear liquid diet, and this has been slowly advanced as tolerated.  He is tolerating a soft diet at present, and his tube feeds have been switched to nocturnal feeds to allow for improved po intake.  He is ambulating in the halls.  He is having normal bowel function.  All incisions are healing well, and his left neck drain has been removed.  He has remained afebrile and all vital signs are stable.  He has been evaluated on today's date and is ready for discharge home.     Recent vital signs:  Filed Vitals:   09/07/12 0542  BP: 92/66  Pulse: 90  Temp: 98.1 F (36.7 C)  Resp: 18      Discharge Medications:     Medication List     As of 09/07/2012 11:28 AM    TAKE these medications         feeding supplement Liqd   Take 1 Container by mouth 3 (three) times  daily between meals.      feeding supplement (VITAL AF 1.2 CAL) Liqd   50 ml/hr per jejunostomy tube from 7p-7a      HYDROcodone-acetaminophen 7.5-500 MG/15ML solution   Commonly known as: LORTAB   Take 10 mLs by mouth every 4 (four) hours as needed for pain.      metoprolol tartrate 12.5 mg Tabs   Commonly known as: LOPRESSOR   Take 0.5 tablets (12.5 mg total) by mouth 2 (two) times daily.      omeprazole 40 MG capsule   Commonly known as: PRILOSEC   Take 1 capsule (40 mg total) by mouth daily.      zolpidem 10 MG tablet   Commonly known as: AMBIEN   Take 10 mg by mouth at bedtime as needed. For insomnia.         Discharge Instructions:  The patient is to refrain from driving, heavy lifting or strenuous  activity.  May clean incisions with soap and water.  May continue soft diet with supplements. Home health RN will assist with nocturnal tube feeds via jejunostomy tube.    Follow Up:      Discharge Orders    Future Appointments: Provider: Department: Dept Phone: Center:   09/14/2012 12:00 PM Ladene Artist, MD [facility] [phone] None   09/24/2012 1:15 PM Delight Ovens, MD [facility] [phone] [facility]      Follow-up Information    Follow up with GERHARDT,EDWARD B, MD. On 09/24/2012. (Have a chest x-ray at 12:15 at [facility] (first floor), then see MD)    Contact information:   [address] [place] [address] [phone]       Follow up with Thornton Papas, MD. (as directed)    Contact information:   [address] [place] [address] [phone]           Knight Oelkers H 09/07/2012, 11:28 AM

## 2012-09-14 ENCOUNTER — Telehealth: Payer: Self-pay | Admitting: Oncology

## 2012-09-14 ENCOUNTER — Ambulatory Visit (HOSPITAL_BASED_OUTPATIENT_CLINIC_OR_DEPARTMENT_OTHER): Payer: PRIVATE HEALTH INSURANCE | Admitting: Oncology

## 2012-09-14 VITALS — BP 104/73 | HR 87 | Temp 98.3°F | Resp 20 | Ht 63.0 in | Wt 118.1 lb

## 2012-09-14 DIAGNOSIS — R1310 Dysphagia, unspecified: Secondary | ICD-10-CM

## 2012-09-14 DIAGNOSIS — D696 Thrombocytopenia, unspecified: Secondary | ICD-10-CM

## 2012-09-14 DIAGNOSIS — R52 Pain, unspecified: Secondary | ICD-10-CM

## 2012-09-14 DIAGNOSIS — C155 Malignant neoplasm of lower third of esophagus: Secondary | ICD-10-CM

## 2012-09-14 NOTE — Telephone Encounter (Signed)
Printed and gv pt appt schedule for Jan 2014 °

## 2012-09-14 NOTE — Progress Notes (Signed)
   [facility]    OFFICE PROGRESS NOTE   INTERVAL HISTORY:   He returns as scheduled. He underwent an esophagectomy procedure on 08/26/2012. The pathology revealed invasive squamous cell carcinoma measuring 2.5 cm. Tumor focally extended through the muscularis propria. Perineural invasion was identified. The surgical margins were negative and no tumor was found in 12 lymph nodes.  He was discharged on 09/07/2012. He feels well. He has pain at the surgical site after eating a large amount of food. He is on night time tube feedings. No other complaint.  Objective:  Vital signs in last 24 hours:  Blood pressure 104/73, pulse 87, temperature 98.3 F (36.8 C), temperature source Oral, resp. rate 20, height 5\' 3"  (1.6 m), weight 118 lb 1.6 oz (53.57 kg).    HEENT: Neck without mass Lymphatics: No cervical, supraclavicular, or axillary nodes Resp: Lungs clear bilaterally Cardio: Regular rate and rhythm GI:  Left upper quadrant feeding tube site with minimal surrounding erythema. Healed midline incision. No hepatomegaly Vascular: No leg edema    Medications: I have reviewed the patient's current medications.  Assessment/Plan: 1. 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 Staging CT scans of the chest, abdomen and pelvis on 06/02/2012 negative for evidence of metastatic disease. Endoscopic ultrasound 06/11/2012 confirmed a uT3 uN1 tumor at 40 cm from the incisors. He began radiation 06/15/2012. He began weekly Taxol/carboplatin chemotherapy 06/16/2012. He completed 6 weekly chemotherapy treatments. The carboplatin was held with week 5 due to thrombocytopenia. He completed the sixth weekly treatment with Taxol and carboplatin on 07/23/2012. He completed radiation on 07/29/2012                  -he underwent an esophagectomy on 08/26/2012 with the pathology confirming a ypT3,ypN0 squamous cell  carcinoma with perineural invasion identified.  2. Solid/liquid dysphagia secondary to the obstructing esophagus mass. 3. Odynophagia/subxiphoid pain secondary to the esophagus mass and radiation. Improved with hydrocodone elixir. 4. History of Thrombocytopenia secondary to chemotherapy.    Disposition:  He appears to be recovering well from the esophagectomy procedure. He will see Dr. Tyrone Sage next week to discuss removal of the feeding tube. I do not recommend adjuvant chemotherapy. He will return for an office visit in 3 months.   Thornton Papas, MD  09/14/2012  2:00 PM

## 2012-09-14 NOTE — Progress Notes (Signed)
Met with patient and his daughter to follow-up and assess for any needs.  He has an interpreter with him for translation.  He is recovering from surgery and states he is doing well.  He states he is eating and drinking more without difficulty.  He continues to drink supplements (Breeze).  Patient and daughter deny need or assistance at this time.  They have been pleased with overall care.  This RN confirmed they have contact information and reminded them to call for any assist.

## 2012-09-21 ENCOUNTER — Other Ambulatory Visit: Payer: Self-pay | Admitting: Cardiothoracic Surgery

## 2012-09-21 DIAGNOSIS — Z9049 Acquired absence of other specified parts of digestive tract: Secondary | ICD-10-CM

## 2012-09-24 ENCOUNTER — Encounter: Payer: Self-pay | Admitting: Cardiothoracic Surgery

## 2012-09-24 ENCOUNTER — Ambulatory Visit (INDEPENDENT_AMBULATORY_CARE_PROVIDER_SITE_OTHER): Payer: Self-pay | Admitting: Cardiothoracic Surgery

## 2012-09-24 ENCOUNTER — Ambulatory Visit
Admission: RE | Admit: 2012-09-24 | Discharge: 2012-09-24 | Disposition: A | Discharge status: Home / Self Care | Payer: PRIVATE HEALTH INSURANCE | Source: Ambulatory Visit | Attending: Cardiothoracic Surgery | Admitting: Cardiothoracic Surgery

## 2012-09-24 VITALS — BP 101/66 | HR 76 | Resp 16 | Ht 63.0 in | Wt 119.0 lb

## 2012-09-24 DIAGNOSIS — Z09 Encounter for follow-up examination after completed treatment for conditions other than malignant neoplasm: Secondary | ICD-10-CM

## 2012-09-24 DIAGNOSIS — C159 Malignant neoplasm of esophagus, unspecified: Secondary | ICD-10-CM

## 2012-09-24 DIAGNOSIS — Z9049 Acquired absence of other specified parts of digestive tract: Secondary | ICD-10-CM

## 2012-09-24 NOTE — Progress Notes (Signed)
[address]            [place] [address]          [phone]       Carlos Heber [facility] Medical Record #[id number] Date of Birth: [birth year]  Ladene Artist, MD Pcp Not In System  Chief Complaint:   PostOp Follow Up Visit 08/26/2012   OPERATIVE REPORT  PREOPERATIVE DIAGNOSIS: Squamous cell carcinoma of the distal  esophagus.  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  SURGICAL PROCEDURE: Bronchoscopy, transhiatal total esophagectomy with  cervical esophagogastrostomy, pyloroplasty, and placement of feeding  jejunostomy.   History of Present Illness:     Patient's making good progress after surgery. He's taking a by mouth diet during the day adequately and is still on supplemental tube feedings at night. He still had some slight weight loss since discharge. He's had no fever chills. He is increasing his physical activity appropriately          History  Smoking status  . Former Smoker -- 0.4 packs/day  . Types: Cigarettes  . Quit date: 06/19/2011  Smokeless tobacco  . Former Neurosurgeon  . Quit date: 06/03/2007       No Known Allergies  Current Outpatient Prescriptions  Medication Sig Dispense Refill  . HYDROcodone-acetaminophen (LORTAB) 7.5-500 MG/15ML solution Take 10 mLs by mouth every 4 (four) hours as needed for pain.  120 mL  0  . metoprolol tartrate (LOPRESSOR) 12.5 mg TABS Take 0.5 tablets (12.5 mg total) by mouth 2 (two) times daily.  30 tablet  1  . Nutritional Supplements (FEEDING SUPPLEMENT, OSMOLITE 1.2 CAL,) LIQD Place 50 mLs into feeding tube every 12 (twelve) hours. 50 ml/hr from 7 pm to 7 am every day, [name] Dietitian with [facility] will change to this formula on 09/24/2012      . omeprazole (PRILOSEC) 40 MG capsule Take 1 capsule (40 mg total) by mouth daily.  30 capsule  5  . zolpidem (AMBIEN) 10 MG tablet Take 10 mg by mouth at bedtime as needed. For insomnia.      .  feeding supplement (RESOURCE BREEZE) LIQD Take 1 Container by mouth 3 (three) times daily between meals.      . Nutritional Supplements (FEEDING SUPPLEMENT, VITAL AF 1.2 CAL,) LIQD 50 ml/hr per jejunostomy tube from 7p-7a           Physical Exam: BP 101/66  Pulse 76  Resp 16  Ht 5\' 3"  (1.6 m)  Wt 119 lb (53.978 kg)  BMI 21.08 kg/m2  SpO2 98%  General appearance: alert and cooperative Neurologic: intact Heart: regular rate and rhythm, S1, S2 normal, no murmur, click, rub or gallop and normal apical impulse Lungs: clear to auscultation bilaterally and normal percussion bilaterally Abdomen: soft, non-tender; bowel sounds normal; no masses,  no organomegaly Extremities: extremities normal, atraumatic, no cyanosis or edema and Homans sign is negative, no sign of DVT Wound: Well-healed abdominal and neck incision Wounds: Jejunostomy tube is in place and secure  Diagnostic Studies & Laboratory data:         Recent Radiology Findings: Dg Chest 2 View  09/24/2012  *RADIOLOGY REPORT*  Clinical Data: Follow-up esophagectomy.  CHEST - 2 VIEW  Comparison: 08/30/1929 radiographs.  Findings: Interval removal of the nasogastric tube and central line.  Heart size and mediastinal contours are stable.  There is a  small right pleural effusion.  No airspace disease, edema or pneumothorax is present.  IMPRESSION: Small residual right pleural effusion.  No pneumothorax or acute process.   Original Report Authenticated By: Carey Bullocks, M.D.       Recent Labs: Lab Results  Component Value Date   WBC 4.8 09/01/2012   HGB 10.8* 09/01/2012   HCT 30.3* 09/01/2012   PLT 176 09/01/2012   GLUCOSE 107* 09/01/2012   ALT 56* 08/24/2012   AST 52* 08/24/2012   NA 136 09/01/2012   K 4.0 09/01/2012   CL 100 09/01/2012   CREATININE 0.74 09/01/2012   BUN 23 09/01/2012   CO2 29 09/01/2012   INR 0.97 08/24/2012      Assessment / Plan:     The pathology revealed invasive squamous cell carcinoma measuring  2.5 cm. Tumor focally extended through the muscularis propria. Perineural invasion was identified. The surgical margins were negative and no tumor was found in 12 lymph nodes.  Patient is doing well postoperatively we'll continue tube feedings at night for additional 2 weeks, then stop them for 2 weeks and if he maintains his weight and is taking a by mouth diet well DC the jejunostomy tube.      Yamilett Anastos B 09/24/2012 5:10 PM

## 2012-09-24 NOTE — Patient Instructions (Signed)
Ch?m [place] v S? D?ng ?ng Thng Cho ?n (Care of a Feeding Tube Site) Nh?ng ng??i g?p kh kh?n khi nu?t ho?c khng th? ?n th?c ?n ho?c u?ng thu?c qua ???ng mi?ng ?i khi ???c cho s? d?ng ?ng cho ?n. M?t ?ng thng cho ?n c th? ?i vo m?i v xu?ng d? dy ho?c qua da ? b?ng v vo trong d? dy ho?c ru?t non. M?t s? tn c?a cc ?ng thng cho ?n ny l ?ng thng d? dy, ?ng PEG, ?ng thng m?i d? dy v ?ng n?i d? dy h?ng trng.  Ch?t l?ng ho?c th?c ?n ? ???c nghi?n (lm thnh m?t mn canh ??c v m?n), cng v?i thu?c, c th? ???c cho vo qua ?ng ny. C nhi?u cch ?? cung c?p th?c ?n d?ng l?ng (cng th?c) v c nhi?u lo?i cng th?c ???c ch? ??nh. Chuyn gia ch?m [place] y t? c?a b?n s? s?p x?p ?? b?n ?? c ???c nh?ng l??ng dinh d??ng ph h?p v?i b?n.  THI?T B? C?N ?? CHO ?N QUA NG THNG  M?t xy-lanh 60 ml.   Cng th?c ???c ?? ngh? b?i chuyn gia ch?m [place] y t? ho?c chuyn gia dinh d??ng c?a b?n.   Nt ring ?? c?m vo ??u ?ng thng cho ?n trong nh?ng lc b?n khng nh?n th?c ?n ho?c thu?c.   B?m th?c ?n ho?c n?i ?? treo ti ??ng th?c ?n (c?t IV ho?c mc treo t??ng) ?? n c th? ho?t ??ng nh? l?c h?p d?n trong khi b?n ???c cho ?n. B?n c?m ?ng t? ti th?c ?n vo cu?i ?ng thng cho ?n. Chuyn gia ch?m [place] y t? c?a b?n s? gip b?n v?i nh?ng v?t t? ny ho?c cho b?n bi?t n?i mua.  TH? T?C CHO ?N QUA NG THNG 1. R?a tay tr??c khi ch?m vo thi?t b?, th?c ?n, thu?c ho?c l? th? (n?i ?ng ?i vo c? th? c?a b?n).  2. Ki?m tra s? s?p ??t ?ng tr??c khi b?t ??u cho ?n b?ng cch rt nt ? cu?i ?ng thng cho ?n, c?m xy-lanh vo ?ng thng cho ?n. Ko pt-tng ng??c l?i v b?n th??ng s? th?y m?t t ch?t l?ng mu vng hay xanh l ch?t d?ch t? d? dy. ?i?u ny cho b?n bi?t ?ng ???c ??t ?ng ch?[place] L?u  s? l??ng v nh? nhng ??y ph?n th?a tr? l?i vo ?ng.   Hy h?i chuyn gia ch?m [place] y t? n?u c nh?ng tr??ng h?p khi b?n s? khng b?t ??u cho ?n qua ?ng, ty thu?c vo s? l??ng ho?c lo?i c?a n?i dung rt ra t? d? dy.   N?u cc ch?t  t? d? dy khng xu?t hi?n khi b?n ko pt-tng ng??c l?i, hy ?o chi?u di t? ch? l? th? (l? trn c? th? c?a b?n ???c t?o ?? cho ?n) ??n cu?i ?ng thng cho ?n. N?u ?? di ny khc so v?i khi ?ng ???c ??t, b?n nn g?i cho chuyn gia ch?m [place] y t? c?a mnh.   N?u t?i b?t k? th?i ?i?m no ?ng thng cho ?n c v? nh? b? t?c, hy s? d?ng xy-lanh v ko pt-tng ng??c l?i. N?u lm theo cch ny khng ???c, hy th? nh? nhng ??y m?t t ?m ho?c n??c qua ?ng. N?u khng c cch no c tc d?ng, hy g?i cho chuyn gia ch?m [place] y t? c?a b?n. ?i?u quan tr?ng l khng b? l? thu?c, th?c ?n ho?c n??c.  3. N?u m?i th? v?i ?ng c  v? ?n, hy c?m ??u ?ng t? ti th?c ?n vo ?ng thng cho ?n.  4. Trong khi ng?i th?ng ho?c v?i ??u ???c nng cao t nh?t 30 ?? ??n 45 ??, hy b?t ??u cho ?n. N?u b?n pht tri?n ho ho?c kh th?, hy ng?ng cho ?n ngay l?p t?c.  5. Cho ?n trong kho?ng th?i gian ???c ch? ??nh b?i chuyn gia ch?m [place] y t? c?a b?n.  6. ?? gi? cho ?ng m?, sau khi cho ?n, hy x? s?ch ?ng b?ng 30 cc n??c s? d?ng xy-lanh ho?c ti v c?m nt vo cu?i ?ng thng cho ?n.  7. Khng ?? th?c ?n cn th?a gi?a cc l?n cho ?n. C?t trong t? l?nh ho?c b?o qu?n theo ch? d?n.  CHO THU?C VO QUA ?NG  S? d?ng thu?c d?ng l?ng, n?u c.   M?t s? thu?c vin ho?c vin nn c th? ???c nghi?n nt v ??a vo n??c ?m. Khng s? d?ng n??c nng, c th? ?nh h??ng ??n thnh ph?n.   H?i d??c s? xem b?n c th? nghi?n nt vin thu?c hay khng. Khng nghi?n vin nang c ch? SR (nn phng thch), XR (phng thch ko di), ho?c CD (phng thch ki?m sot) trn chng, tr? khi chuyn gia ch?m [place] y t? ho?c d??c s? c?a b?n ni c th? nghi?n thu?c.   Sau khi nghi?n thu?c thnh t?ng m?nh nh? ho?c b?t, hy ?? cc m?nh nh? ny tan trong n??c ?m (khng nng) ?? khng c m?nh nh? no lm t?c ?ng. Ht thu?c vo xy-lanh b?ng cch ko pt-tng ng??c l?i.   C?m xy-lanh vo cu?i ?ng thng cho ?n v ??y pt-tng ?? b?m thu?c vo.   X? ?ng b?ng 30 ml n??c sau khi b?m  thu?c. ?i?u ny ??m b?o b?n nh?n ???c ton b? thu?c.  CH?M [place] DA QUANH ??U VO C?A ?NG  Ki?m tra da hng ngy, ch? ?ng ?i vo b?ng xem c b? t?y ??, kch ?ng, ch?y n??c ho?c ?au khng.   Lm s?ch xung quanh ?ng hng ngy b?ng n??c ho?c x phng v n??c b?ng cch s? d?ng t?m bng ho?c g?c hnh vung. Lm kh k? sau khi lm s?ch.   Thay b?ng quanh ch? ?ng ?i vo hng ngy ho?c theo ch? d?n.   Thoa thu?c m? khng sinh quanh ch? ny, n?u ???c ch? d?n b?i chuyn gia ch?m [place] y t?.   N?u b?ng b? ??t ho?c b?n, hy thay b?ng ngay khi c th?.   B?n c th? s? d?ng b?ng dnh ?? gi? ch?t ?ng thng cho ?n vo d? dy ?? tho?i mi ho?c lm theo ch? d?n.  HY NGAY L?P T?C THAM V?N V?I CHUYN GIA Y T? N?U:  B?n pht tri?n ho ho?c kh th? khi cho ?n. D?ng cho ?n v g?i cho chuyn gia ch?m [place] y t? ngay l?p t?c.   B?n nh?n th?y s?ng, t?y ??, r? n??c ho?c nh?y c?m ?au ? ch? ?ng ?i vo.   B?n pht tri?n ?au, bu?n nn, nn m?a, tiu ch?y, to bn ho?c b? ch?y mu quanh ch? ?ng vo.   ?ng c v? ? ???c c?m v b?n khng th? cho n??c ch?y qua ?ng.   C th?c ?n r r? quanh ?ng.   ?ng r?i ra ngoi.  Document Released: 04/24/2010 Document Revised: 10/24/2011 [facility] Patient Information 2012 [place], [place].   Continue tube feeding for two weeks then stop Return in 4 weeks and consider removal of  tube

## 2012-09-25 DIAGNOSIS — Z0279 Encounter for issue of other medical certificate: Secondary | ICD-10-CM

## 2012-10-03 NOTE — Progress Notes (Signed)
FAXED 29 PAGES TO Sue Lush, RN CASE MANAGEMENT @ [facility] [phone] HER PHONE # IS [phone].

## 2012-10-21 ENCOUNTER — Ambulatory Visit (INDEPENDENT_AMBULATORY_CARE_PROVIDER_SITE_OTHER): Payer: Self-pay | Admitting: Cardiothoracic Surgery

## 2012-10-21 ENCOUNTER — Encounter: Payer: Self-pay | Admitting: Cardiothoracic Surgery

## 2012-10-21 VITALS — BP 100/69 | HR 67 | Resp 16 | Ht 63.0 in | Wt 123.0 lb

## 2012-10-21 DIAGNOSIS — Z9049 Acquired absence of other specified parts of digestive tract: Secondary | ICD-10-CM

## 2012-10-21 DIAGNOSIS — Z9889 Other specified postprocedural states: Secondary | ICD-10-CM

## 2012-10-21 DIAGNOSIS — C159 Malignant neoplasm of esophagus, unspecified: Secondary | ICD-10-CM

## 2012-10-21 NOTE — Progress Notes (Signed)
[address]            [place] [address]          [phone]          Bruce [name] [facility] Medical Record #[id number] Date of Birth: [birth year]  No ref. 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  SURGICAL PROCEDURE: Bronchoscopy, transhiatal total esophagectomy with  cervical esophagogastrostomy, pyloroplasty, and placement of feeding  jejunostomy.   History of Present Illness:     Patient's making good progress after surgery. He's taking a by mouth diet during the day adequately . He has not used any tube feedings for the past several weeks, he notes that he is back to atypical diet without difficulty. He does note bloating feeling and occasional regurgitation if he eats too much at one time. He is anxious to have this jejunostomy feeding tube removed.        History  Smoking status  . Former Smoker -- 0.4 packs/day  . Types: Cigarettes  . Quit date: 06/19/2011  Smokeless tobacco  . Former Neurosurgeon  . Quit date: 06/03/2007       No Known Allergies  Current Outpatient Prescriptions  Medication Sig Dispense Refill  . HYDROcodone-acetaminophen (LORTAB) 7.5-500 MG/15ML solution Take 10 mLs by mouth every 4 (four) hours as needed for pain.  120 mL  0  . metoprolol tartrate (LOPRESSOR) 12.5 mg TABS Take 0.5 tablets (12.5 mg total) by mouth 2 (two) times daily.  30 tablet  1  . Nutritional Supplements (FEEDING SUPPLEMENT, OSMOLITE 1.2 CAL,) LIQD Place 50 mLs into feeding tube every 12 (twelve) hours. 50 ml/hr from 7 pm to 7 am every day, [name] Dietitian with [facility] will change to this formula on 09/24/2012      . omeprazole (PRILOSEC) 40 MG capsule Take 1 capsule (40 mg total) by mouth daily.   30 capsule  5  . zolpidem (AMBIEN) 10 MG tablet Take 10 mg by mouth at bedtime as needed. For insomnia.           Physical Exam: BP 100/69  Pulse 67  Resp 16  Ht 5\' 3"  (1.6 m)  Wt 123 lb (55.792 kg)  BMI 21.79 kg/m2  SpO2 98%  General appearance: alert and cooperative Neurologic: intact Heart: regular rate and rhythm, S1, S2 normal, no murmur, click, rub or gallop and normal apical impulse Lungs: clear to auscultation bilaterally and normal percussion bilaterally Abdomen: soft, non-tender; bowel sounds normal; no masses,  no organomegaly Extremities: extremities normal, atraumatic, no cyanosis or edema and Homans sign is negative, no sign of DVT Wound: Well-healed abdominal and neck incision Wounds: Jejunostomy tube is in place and secure  Diagnostic Studies & Laboratory data:         Recent Radiology Findings: No results found.    Recent Labs: Lab Results  Component Value Date   WBC 4.8 09/01/2012   HGB 10.8* 09/01/2012   HCT 30.3* 09/01/2012   PLT 176 09/01/2012   GLUCOSE 107* 09/01/2012   ALT 56* 08/24/2012   AST 52* 08/24/2012   NA  136 09/01/2012   K 4.0 09/01/2012   CL 100 09/01/2012   CREATININE 0.74 09/01/2012   BUN 23 09/01/2012   CO2 29 09/01/2012   INR 0.97 08/24/2012      Assessment / Plan:     The pathology revealed invasive squamous cell carcinoma measuring 2.5 cm. Tumor focally extended through the muscularis propria. Perineural invasion was identified. The surgical margins were negative and no tumor was found in 12 lymph nodes.  Patient is doing well postoperatively. His feeding jejunostomy tube was removed in the office today without difficulty. 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 I'll plan to see him back in 2 months   Bruce [name] B 10/21/2012 5:31 PM

## 2012-10-21 NOTE — Patient Instructions (Signed)
Careful not too eat too much at one time

## 2012-10-22 ENCOUNTER — Ambulatory Visit: Payer: PRIVATE HEALTH INSURANCE | Admitting: Cardiothoracic Surgery

## 2012-11-05 ENCOUNTER — Other Ambulatory Visit: Payer: Self-pay | Admitting: Physician Assistant

## 2012-11-16 ENCOUNTER — Telehealth: Payer: Self-pay | Admitting: Radiation Oncology

## 2012-11-16 NOTE — Telephone Encounter (Signed)
Faxed EOT 07/29/12, labs 09/01/12 to [employer], [phone].  OK per JSM.  Received confirmation.

## 2012-11-20 ENCOUNTER — Telehealth: Payer: Self-pay | Admitting: Radiation Oncology

## 2012-11-20 NOTE — Telephone Encounter (Signed)
[employer] states they didn't receive records.  Faxed again to [phone].  EOT 07/29/12, labs 09/01/12.  Received confirmation.

## 2012-11-25 ENCOUNTER — Encounter: Payer: Self-pay | Admitting: Oncology

## 2012-11-25 NOTE — Progress Notes (Signed)
Faxed paperwork to [employer] [phone]. Forward to Medical Records.

## 2012-12-01 ENCOUNTER — Other Ambulatory Visit: Payer: Self-pay | Admitting: *Deleted

## 2012-12-01 DIAGNOSIS — C159 Malignant neoplasm of esophagus, unspecified: Secondary | ICD-10-CM

## 2012-12-03 ENCOUNTER — Encounter: Payer: Self-pay | Admitting: Cardiothoracic Surgery

## 2012-12-03 ENCOUNTER — Ambulatory Visit
Admission: RE | Admit: 2012-12-03 | Discharge: 2012-12-03 | Disposition: A | Discharge status: Home / Self Care | Payer: PRIVATE HEALTH INSURANCE | Source: Ambulatory Visit | Attending: Cardiothoracic Surgery | Admitting: Cardiothoracic Surgery

## 2012-12-03 ENCOUNTER — Ambulatory Visit (INDEPENDENT_AMBULATORY_CARE_PROVIDER_SITE_OTHER): Payer: PRIVATE HEALTH INSURANCE | Admitting: Cardiothoracic Surgery

## 2012-12-03 VITALS — BP 104/64 | HR 81 | Resp 18 | Ht 63.0 in | Wt 122.5 lb

## 2012-12-03 DIAGNOSIS — C159 Malignant neoplasm of esophagus, unspecified: Secondary | ICD-10-CM

## 2012-12-03 DIAGNOSIS — Z9049 Acquired absence of other specified parts of digestive tract: Secondary | ICD-10-CM

## 2012-12-03 DIAGNOSIS — Z9889 Other specified postprocedural states: Secondary | ICD-10-CM

## 2012-12-03 NOTE — Patient Instructions (Addendum)
Stop Lopressor   Dumping Syndrome Rapid gastric emptying, or dumping syndrome, happens when the lower end of the small intestine fills too quickly with undigested food from the stomach. "Early" dumping begins during or right after a meal. "Late" dumping happens 1 to 3 hours after eating. Many people have both types. CAUSES   Certain types of stomach surgery that allow the stomach to empty rapidly are the main cause of dumping syndrome. SYMPTOMS Early dumping  Nausea.  Bloating.  Dizziness.  Vomiting.  Cramping.  Diarrhea.  Fatigue. Late dumping  Low blood sugar (hypoglycemia).  Weakness.  Sweating.  Dizziness. DIAGNOSIS  Doctors diagnose dumping syndrome primarily on the basis of symptoms in patients who have had gastric surgery that causes the syndrome. Tests may be needed to exclude other conditions that have similar symptoms. TREATMENT   Treatment includes changes in eating habits and medicine.  People who have dumping syndrome need to eat several small meals a day that are low in carbohydrates and should drink liquids between meals, not with them.  People with severe cases may be prescribed medicine to slow their digestion.  Caregivers may at times recommend surgery to help correct the problem. Document Released: 09/27/2004 Document Revised: 01/27/2012 Document Reviewed: 09/29/2008 [facility] Patient Information 2013 [place], [place].

## 2012-12-03 NOTE — Progress Notes (Signed)
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  SURGICAL PROCEDURE: Bronchoscopy, transhiatal total esophagectomy with  cervical esophagogastrostomy, pyloroplasty, and placement of feeding  jejunostomy.   History of Present Illness:     The patient returns to the office today after esophagectomy in early October. He has been doing relatively well denies any trouble swallowing. He and his daughter notes that he said 3 episodes of feeling faint soon after he eats associated with mild diaphoresis. He's also had some reflux symptoms at night.      History  Smoking status  . Former Smoker -- 0.4 packs/day  . Types: Cigarettes  . Quit date: 06/19/2011  Smokeless tobacco  . Former Neurosurgeon  . Quit date: 06/03/2007       No Known Allergies  Current Outpatient Prescriptions  Medication Sig Dispense Refill  . omeprazole (PRILOSEC) 40 MG capsule Take 1 capsule (40 mg total) by mouth daily.  30 capsule  5  . zolpidem (AMBIEN) 10 MG tablet Take 10 mg by mouth at bedtime as needed. For insomnia.           Physical Exam: BP 104/64  Pulse 81  Resp 18  Ht 5\' 3"  (1.6 m)  Wt 122 lb 8 oz (55.566 kg)  BMI 21.70 kg/m2  SpO2 97%  General appearance: alert and cooperative Neurologic: intact Heart: regular rate and rhythm, S1, S2 normal, no murmur, click, rub or gallop and normal apical impulse Lungs: clear to auscultation bilaterally and normal percussion bilaterally Abdomen: soft, non-tender; bowel sounds normal; no masses,  no organomegaly Extremities: extremities normal, atraumatic,  no cyanosis or edema and Homans sign is negative, no sign of DVT Wound: Well-healed abdominal and neck incision    Recent Radiology Findings: Dg Chest 2 View  12/03/2012  *RADIOLOGY REPORT*  Clinical Data: History of esophageal carcinoma, follow-up  CHEST - 2 VIEW  Comparison: Chest x-ray of 09/24/2012 and 08/24/2012  Findings:  No active infiltrate or effusion is seen.  No lung nodules are noted.  No adenopathy is noted.  The heart is mildly enlarged and stable.  No bony abnormality is seen.  IMPRESSION: Stable chest x-ray.  No active lung disease.  No evidence of metastatic involvement of the lungs.   Original Report Authenticated By: Dwyane Dee, M.D.       Recent Labs: Lab Results  Component Value Date   WBC 4.8 09/01/2012   HGB 10.8* 09/01/2012   HCT 30.3* 09/01/2012   PLT 176 09/01/2012   GLUCOSE 107* 09/01/2012   ALT 56* 08/24/2012   AST 52* 08/24/2012   NA 136 09/01/2012   K 4.0 09/01/2012   CL 100 09/01/2012   CREATININE 0.74 09/01/2012   BUN 23 09/01/2012   CO2 29 09/01/2012   INR 0.97 08/24/2012      Assessment / Plan:     The pathology revealed invasive squamous cell carcinoma measuring 2.5 cm. Tumor focally extended  through the muscularis propria. Perineural invasion was identified. The surgical margins were negative and no tumor was found in 12 lymph nodes.  Patient has been having symptoms consistent with dumping, have reviewed with him through an interpreter and his daughter the causes and changes in dietary habits that may help prevent this In addition his blood pressure has not been elevated so we will discontinue his Lopressor which may be adding to the symptoms of lightheadedness when he stands. I plan to see him back in approximately 3 months, if the current recommendations do not help the symptomatology his daughter will call back.     Bruce [name] B 12/03/2012 12:40 PM

## 2012-12-15 ENCOUNTER — Telehealth: Payer: Self-pay | Admitting: Oncology

## 2012-12-15 ENCOUNTER — Ambulatory Visit (HOSPITAL_BASED_OUTPATIENT_CLINIC_OR_DEPARTMENT_OTHER): Payer: PRIVATE HEALTH INSURANCE | Admitting: Oncology

## 2012-12-15 VITALS — BP 113/81 | HR 84 | Temp 97.7°F | Resp 20 | Ht 63.0 in | Wt 120.9 lb

## 2012-12-15 DIAGNOSIS — R21 Rash and other nonspecific skin eruption: Secondary | ICD-10-CM

## 2012-12-15 DIAGNOSIS — Z8501 Personal history of malignant neoplasm of esophagus: Secondary | ICD-10-CM

## 2012-12-15 DIAGNOSIS — C155 Malignant neoplasm of lower third of esophagus: Secondary | ICD-10-CM

## 2012-12-15 NOTE — Progress Notes (Signed)
   [facility]    OFFICE PROGRESS NOTE   INTERVAL HISTORY:   He returns as scheduled. He has a good appetite and energy level. He reports discomfort with "hard "food. He eats multiple times per day.  He complains of diffuse pruritus, chiefly over the trunk. An over-the-counter cream did not help. No new medications.  Objective:  Vital signs in last 24 hours:  Blood pressure 113/81, pulse 84, temperature 97.7 F (36.5 C), temperature source Oral, resp. rate 20, height 5\' 3"  (1.6 m), weight 120 lb 14.4 oz (54.84 kg).    HEENT: Neck without mass Lymphatics: No cervical, supraclavicular, or axillary nodes Resp: In inspiratory fine rails at the right greater than left posterior base, no respiratory distress Cardio: Regular rate and rhythm GI: No hepatomegaly, nontender Vascular: No leg edema  Skin: In try confluent rash over the abdomen, bilateral flank, and upper lateral thighs    Medications: I have reviewed the patient's current medications.  Assessment/Plan: 1. 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 Staging CT scans of the chest, abdomen and pelvis on 06/02/2012 negative for evidence of metastatic disease. Endoscopic ultrasound 06/11/2012 confirmed a uT3 uN1 tumor at 40 cm from the incisors. He began radiation 06/15/2012. He began weekly Taxol/carboplatin chemotherapy 06/16/2012. He completed 6 weekly chemotherapy treatments. The carboplatin was held with week 5 due to thrombocytopenia. He completed the sixth weekly treatment with Taxol and carboplatin on 07/23/2012. He completed radiation on 07/29/2012 -he underwent an esophagectomy on 08/26/2012 with the pathology confirming a ypT3,ypN0 squamous cell carcinoma with perineural invasion identified.  2. History of Solid/liquid dysphagia secondary to the obstructing esophagus mass. 3.  pruritus/rash-he appears to have an eczematoid  rash over the trunk, he will try hydrocortisone ointment. He will use oral Benadryl as needed for pruritus.  Disposition:  He remains in clinical remission from esophagus cancer. He will consult with Dr. Tyrone Sage regarding a return to work. His daughter will contact us if the rash is not improved over the next week. We will make a dermatology referral.  Mr.Babich will return for an office visit in 6 months.   Thornton Papas, MD  12/15/2012  4:24 PM

## 2012-12-15 NOTE — Telephone Encounter (Signed)
Gave pt appt for July 2014 ML visit only

## 2012-12-24 ENCOUNTER — Ambulatory Visit: Payer: PRIVATE HEALTH INSURANCE | Admitting: Cardiothoracic Surgery

## 2013-02-24 DIAGNOSIS — Z0279 Encounter for issue of other medical certificate: Secondary | ICD-10-CM

## 2013-03-04 ENCOUNTER — Encounter: Payer: Self-pay | Admitting: Cardiothoracic Surgery

## 2013-03-04 ENCOUNTER — Ambulatory Visit
Admission: RE | Admit: 2013-03-04 | Discharge: 2013-03-04 | Disposition: A | Discharge status: Home / Self Care | Payer: PRIVATE HEALTH INSURANCE | Source: Ambulatory Visit | Attending: Cardiothoracic Surgery | Admitting: Cardiothoracic Surgery

## 2013-03-04 ENCOUNTER — Ambulatory Visit (INDEPENDENT_AMBULATORY_CARE_PROVIDER_SITE_OTHER): Payer: PRIVATE HEALTH INSURANCE | Admitting: Cardiothoracic Surgery

## 2013-03-04 ENCOUNTER — Other Ambulatory Visit: Payer: Self-pay | Admitting: *Deleted

## 2013-03-04 VITALS — BP 110/70 | HR 70 | Resp 18 | Ht 63.0 in | Wt 124.0 lb

## 2013-03-04 DIAGNOSIS — Z9889 Other specified postprocedural states: Secondary | ICD-10-CM

## 2013-03-04 DIAGNOSIS — C159 Malignant neoplasm of esophagus, unspecified: Secondary | ICD-10-CM

## 2013-03-04 DIAGNOSIS — C155 Malignant neoplasm of lower third of esophagus: Secondary | ICD-10-CM

## 2013-03-04 DIAGNOSIS — Z9049 Acquired absence of other specified parts of digestive tract: Secondary | ICD-10-CM

## 2013-03-04 NOTE — Patient Instructions (Addendum)
Keep head of bed elevated 6 in Do not eat or dring for two hours before going to bed Avoid sweats and milk if stools loose B?nh Tro Ng??c D? Dy Th?c Qu?n, Ng??i L?n  (Gastroesophageal Reflux Diseaes, Adult)  B?nh tro ng??c d? dy th?c qu?n (GERD) x?y ra khi axit t? d? dy tro ln th?c qu?n. Khi axit ti?p xc v?i th?c qu?n, axit gy ra ?au (vim) trong th?c qu?n. Theo th?i gian, GERD c th? t?o ra cc l? nh? (lot) ? nim m?c th?c qu?n. NGUYN NHN   Tr?ng l??ng c? th? t?ng. ?i?u ny ??t p l?c ln d? dy, lm t?ng axit t? d? dy vo th?c qu?n.  Ht thu?c l. Ht thu?c l lm t?ng l??ng axit ???c s?n sinh trong d? dy.  U?ng r??u. U?ng r??u lm gi?m p l?c trong c? th?t th?c qu?n d??i (van ho?c vnh c?a c? gi?a th?c qu?n v d? dy), cho php axit t? d? dy vo th?c qu?n.  ?n t?i mu?n v b?ng no. Tnh tr?ng ny lm t?ng p l?c c?ng nh? l??ng axit ???c s?n sinh trong d? dy.  D? t?t c? th?t th?c qu?n d??i. ?i khi khng tm th?y nguyn nhn.  TRI?U CH?NG   ?au rt ? ph?n d??i gi?a ng?c pha sau x??ng ?c v trong khu v?c gi?a d? dy. Hi?n t??ng ny c th? x?y ra hai l?n m?t tu?n ho?c th??ng xuyn h?n.  Kh nu?t.  ?au h?ng.  Ho khan.  Cc tri?u ch?ng gi?ng hen suy?n, bao g?m t?c ng?c, th? d?c ho?c th? kh kh. CH?N ?ON  Chuyn gia ch?m [place] y t? c th? ch?n ?on GERD d?a trn cc tri?u ch?ng c?a b?n. Trong m?t s? tr??ng h?p, ch?p X quang v cc xt nghi?m khc c th? ???c ti?n hnh ?? ki?m tra cc bi?n ch?ng ho?c tnh tr?ng c?a d? dy v th?c qu?n.  ?I?U TR?  Chuyn gia ch?m [place] y t? c th? ?? xu?t thu?c mua tr?c ti?p t?i hi?u thu?c ho?c thu?c theo toa ?? gip gi?m l??ng axit ???c s?n sinh. Hy h?i chuyn gia ch?m [place] y t? c?a b?n tr??c khi b?t ??u ho?c thm b?t k? lo?i thu?c m?i no.  H??NG D?N CH?M [place] T?I NH   Thay ??i cc y?u t? m b?n c th? ki?m sot. H?i chuyn gia ch?m [place] y t? ?? ???c h??ng d?n v? vi?c gi?m cn, b? thu?c l v r??u.  [place] cc lo?i th?c ph?m v ?? u?ng  lm cho cc tri?u ch?ng t?i t? h?n, ch?ng h?n nh?:  Caffeine ho?c ?? u?ng c c?n.  S c la.  B?c h ho?c v? b?c h.  T?i v hnh ty.  Th?c ?n [place].  Tri cy h? cam, ch?ng h?n nh? cam, [place] hay [place] ty.  Cc th?c ?n trn c? s? c chua, ch?ng h?n nh? n??c x?t, ?t, salsa (n??c x?t [place]) v bnh pizza.  Cc lo?i th?c ?n chin xo v nhi?u ch?t bo.  Trnh n?m xu?ng ng? 3 ti?ng tr??c gi? ?i ng? ho?c tr??c khi c m?t gi?c ng? ng?n.  ?n nh?ng b?a ?n nh?, th??ng xuyn h?n thay v cc b?a ?n l?n.  M?c qu?n o r?ng. Khng ?eo b?t c? th? g ch?t quanh th?t l?ng gy p l?c ln d? dy.  Nng ??u gi??ng cao ln t? 6 ??n 8 inch b?ng cc kh?i g? ?? gip b?n ng?. S? d?ng thm g?i s? khng c tc d?ng.  Ch? s? d?ng thu?c mua tr?c ti?p t?i hi?u thu?c ho?c thu?c theo toa ?? gi?m ?au, gi?m s? kh ch?u ho?c h? s?t theo ch? d?n c?a chuyn gia ch?m [place] y t? c?a b?n.  Khng dng thu?c atpirin, ibuprofen ho?c cc thu?c ch?ng vim khng c steroid (NSAID) khc. HY NGAY L?P T?C THAM V?N V?I CHUYN GIA Y T? N?U:   B?n b? ?au ? cnh tay, c?, hm, r?ng ho?c l?ng.  Hi?n t??ng ?au t?ng ln ho?c thay ??i theo c??ng ?? ho?c th?i gian.  B?n b? bu?n nn, nn ho?c ?? m? hi (tot m? hi).  B?n b? th? d?c ho?c ng?t x?u.  Ch?t nn c mu xanh l cy, vng, ?en ho?c trng gi?ng nh? b c ph ho?c mu.  Phn c mu ??, ?? nh? mu ho?c ?en. Nh?ng tri?u ch?ng ny c th? l d?u hi?u c?a cc v?n ?? khc, ch?ng h?n nh? b?nh tim, ch?y mu d? dy ho?c ch?y mu th?c qu?n.  ??M B?O B?N:   Hi?u cc h??ng d?n ny.  S? theo di tnh tr?ng c?a mnh.  S? yu c?u tr? gip ngay l?p t?c n?u b?n c?m th?y khng kh?e ho?c tnh tr?ng tr? nn t?i h?n. Document Released: 08/14/2005 Document Revised: 01/27/2012 [facility] Patient Information 2013 [place], [place].

## 2013-03-04 NOTE — Progress Notes (Signed)
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  SURGICAL PROCEDURE: Bronchoscopy, transhiatal total esophagectomy with  cervical esophagogastrostomy, pyloroplasty, and placement of feeding  jejunostomy.  ypT3,ypN0 squamous cell carcinoma with perineural invasion identified Stage IIb  History of Present Illness:     The patient returns to the office today after esophagectomy in early October 2013 . He has been doing relatively well denies any trouble swallowing. He continues to eat multiple small meals. His biggest complaints of been difficulty sleeping, and at night while in bed having episodes of reflux especially with liquid. The previous dumping episodes he had have resolved by morning very products and sweets.    Status post endoscopic biopsy on 05/28/2012 with pathology confirming invasive squamous cell carcinoma. Staging CT scans of the chest, abdomen and pelvis on 06/02/2012 negative for evidence of metastatic disease. Endoscopic ultrasound 06/11/2012 confirmed a uT3 uN1 tumor at 40 cm from the incisors. He began radiation 06/15/2012. He began weekly Taxol/carboplatin chemotherapy 06/16/2012. He completed 6 weekly chemotherapy treatments. The carboplatin was held with week 5 due to thrombocytopenia. He completed the sixth weekly treatment with Taxol and carboplatin on 07/23/2012. He completed radiation on 07/29/2012 -he underwent an esophagectomy on 08/26/2012 with the pathology confirming a ypT3,ypN0 squamous cell carcinoma with perineural invasion identified  History  Smoking status    . Former Smoker -- 0.40 packs/day  . Types: Cigarettes  . Quit date: 06/19/2011  Smokeless tobacco  . Former Neurosurgeon  . Quit date: 06/03/2007       No Known Allergies  Current Outpatient Prescriptions  Medication Sig Dispense Refill  . omeprazole (PRILOSEC) 40 MG capsule Take 1 capsule (40 mg total) by mouth daily.  30 capsule  5   No current facility-administered medications for this visit.   Wt Readings from Last 3 Encounters:  03/04/13 124 lb (56.246 kg)  12/15/12 120 lb 14.4 oz (54.84 kg)  12/03/12 122 lb 8 oz (55.566 kg)      Physical Exam: BP 110/70  Pulse 70  Resp 18  Ht 5\' 3"  (1.6 m)  Wt 124 lb (56.246 kg)  BMI 21.97 kg/m2  SpO2 98%  General appearance: alert and cooperative Neurologic: intact Heart: regular rate and rhythm, S1, S2 normal, no murmur, click, rub or gallop and normal apical impulse Lungs: clear to auscultation bilaterally and normal percussion bilaterally Abdomen: soft, non-tender; bowel sounds normal; no masses,  no organomegaly Extremities: extremities normal, atraumatic, no cyanosis or edema and Homans sign is negative, no sign of DVT Wound: Well-healed abdominal and neck incision. He has no cervical or supraclavicular adenopathy    Recent Radiology Findings: Dg Chest 2 View  03/04/2013  *RADIOLOGY REPORT*  Clinical Data: Esophageal cancer.  CHEST - 2 VIEW  Comparison: 12/03/2012  Findings: Heart is normal size.  Mediastinal contours are within normal limits.  Stable mild interstitial prominence throughout the lungs.  Small hiatal hernia likely present.  No  confluent opacities or effusions.  IMPRESSION: Stable exam.  No acute findings.   Original Report Authenticated By: Charlett Nose, M.D.       Recent Labs: Lab Results  Component Value Date   WBC 4.8 09/01/2012   HGB 10.8* 09/01/2012   HCT 30.3* 09/01/2012   PLT 176 09/01/2012   GLUCOSE 107* 09/01/2012   ALT 56* 08/24/2012   AST 52* 08/24/2012   NA 136 09/01/2012   K 4.0 09/01/2012    CL 100 09/01/2012   CREATININE 0.74 09/01/2012   BUN 23 09/01/2012   CO2 29 09/01/2012   INR 0.97 08/24/2012      Assessment / Plan:     The pathology revealed invasive squamous cell carcinoma measuring 2.5 cm. Tumor focally extended through the muscularis propria. Perineural invasion was identified. The surgical margins were negative and no tumor was found in 12 lymph nodes.  Status post endoscopic biopsy on 05/28/2012 with pathology confirming invasive squamous cell carcinoma. Staging CT scans of the chest, abdomen and pelvis on 06/02/2012 negative for evidence of metastatic disease. Endoscopic ultrasound 06/11/2012 confirmed a uT3 uN1 tumor at 40 cm from the incisors. He began radiation 06/15/2012. He began weekly Taxol/carboplatin chemotherapy 06/16/2012. He completed 6 weekly chemotherapy treatments. The carboplatin was held with week 5 due to thrombocytopenia. He completed the sixth weekly treatment with Taxol and carboplatin on 07/23/2012. He completed radiation on 07/29/2012 -he underwent an esophagectomy on 08/26/2012 with the pathology confirming a ypT3,ypN0 squamous cell carcinoma with perineural invasion identified  Patient has been having symptoms consistent with reflux, have reviewed with him through an interpreter and his daughter the causes and changes in dietary habits that may help prevent this I plan to see him back in approximately 3-4  Months with follow up ct of the chest, if the current recommendations do not help the symptomatology his daughter will call back.   Bruce [name] B 03/04/2013 11:35 AM

## 2013-03-08 DIAGNOSIS — Z0279 Encounter for issue of other medical certificate: Secondary | ICD-10-CM

## 2013-04-30 ENCOUNTER — Other Ambulatory Visit: Payer: Self-pay | Admitting: *Deleted

## 2013-04-30 DIAGNOSIS — C155 Malignant neoplasm of lower third of esophagus: Secondary | ICD-10-CM

## 2013-06-07 ENCOUNTER — Other Ambulatory Visit: Payer: Self-pay | Admitting: Nurse Practitioner

## 2013-06-11 ENCOUNTER — Telehealth: Payer: Self-pay | Admitting: Oncology

## 2013-06-11 NOTE — Telephone Encounter (Signed)
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

## 2013-06-14 ENCOUNTER — Ambulatory Visit: Payer: PRIVATE HEALTH INSURANCE | Admitting: Nurse Practitioner

## 2013-06-15 ENCOUNTER — Ambulatory Visit
Admission: RE | Admit: 2013-06-15 | Discharge: 2013-06-15 | Disposition: A | Discharge status: Home / Self Care | Payer: Medicaid Other | Source: Ambulatory Visit | Attending: Cardiothoracic Surgery | Admitting: Cardiothoracic Surgery

## 2013-06-15 ENCOUNTER — Telehealth: Payer: Self-pay | Admitting: Oncology

## 2013-06-15 ENCOUNTER — Ambulatory Visit (INDEPENDENT_AMBULATORY_CARE_PROVIDER_SITE_OTHER): Payer: Medicaid Other | Admitting: Cardiothoracic Surgery

## 2013-06-15 ENCOUNTER — Encounter: Payer: Self-pay | Admitting: Cardiothoracic Surgery

## 2013-06-15 VITALS — BP 119/72 | HR 80 | Resp 18 | Ht 63.0 in | Wt 128.0 lb

## 2013-06-15 DIAGNOSIS — C159 Malignant neoplasm of esophagus, unspecified: Secondary | ICD-10-CM

## 2013-06-15 DIAGNOSIS — C155 Malignant neoplasm of lower third of esophagus: Secondary | ICD-10-CM

## 2013-06-15 DIAGNOSIS — Z9889 Other specified postprocedural states: Secondary | ICD-10-CM

## 2013-06-15 MED ORDER — IOHEXOL 300 MG/ML  SOLN
75.0000 mL | Freq: Once | INTRAMUSCULAR | Status: AC | PRN
  Administered 2013-06-15: 75 mL via INTRAVENOUS

## 2013-06-15 NOTE — Progress Notes (Signed)
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  SURGICAL PROCEDURE: Bronchoscopy, transhiatal total esophagectomy with  cervical esophagogastrostomy, pyloroplasty, and placement of feeding  jejunostomy.  ypT3,ypN0 squamous cell carcinoma with perineural invasion identified Stage IIb  History of Present Illness:     The patient returns to the office today after esophagectomy in early October 2013 . He has been doing relatively well denies any trouble swallowing. He continues to eat multiple small meals. His biggest complaints of been difficulty sleeping, and at night while in bed having episodes of reflux especially with liquid. The previous dumping episodes he had have resolved by morning very products and sweets.    Status post endoscopic biopsy on 05/28/2012 with pathology confirming invasive squamous cell carcinoma. Staging CT scans of the chest, abdomen and pelvis on 06/02/2012 negative for evidence of metastatic disease. Endoscopic ultrasound 06/11/2012 confirmed a uT3 uN1 tumor at 40 cm from the incisors. He began radiation 06/15/2012. He began weekly Taxol/carboplatin chemotherapy 06/16/2012. He completed 6 weekly chemotherapy treatments. The carboplatin was held with week 5 due to thrombocytopenia. He completed the sixth weekly treatment with Taxol and carboplatin on 07/23/2012. He completed radiation on 07/29/2012 -he underwent an esophagectomy on 08/26/2012 with the pathology confirming a ypT3,ypN0 squamous cell carcinoma with perineural invasion identified.  The patient has had some  minor dumping symptoms, he has some reflux symptoms which have improved by elevating the head of his bed. He notes that protonic next did not help and he is discontinued. He does become full early after eating, but appears to be maintaining his nutritional status well gaining 8 pounds since the beginning of the year.  His biggest complaint today was an area in the midportion of his incision which was "sticking". He also has some dizziness with activity, not related to eating.  History  Smoking status  . Former Smoker -- 0.40 packs/day  . Types: Cigarettes  . Quit date: 06/19/2011  Smokeless tobacco  . Former Neurosurgeon  . Quit date: 06/03/2007       No Known Allergies  No current outpatient prescriptions on file.   No current facility-administered medications for this visit.   Wt Readings from Last 3 Encounters:  06/15/13 128 lb (58.06 kg)  03/04/13 124 lb (56.246 kg)  12/15/12 120 lb 14.4 oz (54.84 kg)      Physical Exam: BP 119/72  Pulse 80  Resp 18  Ht 5\' 3"  (1.6 m)  Wt 128 lb (58.06 kg)  BMI 22.68 kg/m2  SpO2 98%  General appearance: alert and cooperative Neurologic: intact Heart: regular rate and rhythm, S1, S2 normal, no murmur, click, rub or gallop and normal apical impulse Lungs: clear to auscultation bilaterally and normal percussion bilaterally Abdomen: soft, non-tender; bowel sounds normal; no masses,  no organomegaly, there is no incisional hernia, he does have an area in the midportion of the abdominal incision its irritated and has probable related to the Prolene suture  used for wound closure. Extremities: extremities normal, atraumatic, no cyanosis or edema and Homans sign is negative, no sign of DVT Wound: Well-healed abdominal and neck incision. He has no cervical or supraclavicular adenopathy    Recent Radiology Findings: Ct Chest W Contrast  06/15/2013   *RADIOLOGY REPORT*  Clinical Data: Esophageal cancer.  Followup.  Prior radiation and chemotherapy.   Tumor resection 2013.  CT CHEST WITH CONTRAST  Technique:  Multidetector CT imaging of the chest was performed following the standard protocol during bolus administration of intravenous contrast.  Contrast: 75mL OMNIPAQUE IOHEXOL 300 MG/ML  SOLN  Comparison: water-soluble swallow 09/02/2012.  Chest CT 06/02/2012.  Findings: The postoperative changes from esophagectomy and gastric pull-through procedure.  No visible recurrent the mass.  Scattered small mediastinal lymph nodes, none pathologically enlarged.  No hilar adenopathy.  Heart is normal size. Aorta is normal caliber. No mediastinal, hilar, or axillary adenopathy.  Small subpleural nodule noted in the posterior right lower lobe on image 26.  This measures 7 mm. There was atelectasis in this area previously and this nodule was not definitively seen.  Recommend attention on follow-up imaging. Mild subpleural reticulation throughout the lungs, likely scarring / fibrosis.  Mild COPD changes in the lung apices.  No pleural effusions.  Imaging into the upper abdomen shows no acute findings.  No acute or focal bony abnormality.  IMPRESSION: Postoperative changes from esophagectomy and gastric pull-through.  7 mm posterior subpleural nodule in the right lower lobe. Recommend attention on follow-up imaging.  COPD/chronic changes within the lungs.   Original Report Authenticated By: Charlett Nose, M.D.   Recent Labs: Lab Results  Component Value Date   WBC 4.8 09/01/2012   HGB 10.8* 09/01/2012   HCT 30.3* 09/01/2012   PLT 176 09/01/2012   GLUCOSE 107* 09/01/2012   ALT 56* 08/24/2012   AST 52* 08/24/2012   NA 136 09/01/2012   K 4.0 09/01/2012   CL 100 09/01/2012   CREATININE 0.74 09/01/2012   BUN 23 09/01/2012   CO2 29 09/01/2012   INR 0.97 08/24/2012   Procedure in the office: The patient's abdominal incision was prepped with Betadine draped in a sterile manner 1% lidocaine was infiltrated in the area. The area where he had had complaint was excised  through the dermis, a portion of the cut end of the Prolene suture was identified and probable the source of his irritation. Dissection was carried down slightly deeper in the portion of retained Prolene not was excised. Patient was instructed in local wound care. A dressing was applied   Assessment / Plan:   Status post surgical resection of  ypT3,ypN0 squamous cell carcinoma of the esophagus with perineural invasion identified Stage IIb Removal of retained foreign material abdominal wound. CT scan today without obvious recurrence, but a 7 mm posterior subpleural nodule in the right lower lobe in area of previous atelectasis is noted The patient will return in 6 months with a followup CT scan of the chest. The patient will see his primary care physician to discuss the evaluation of dizziness/previously he has been followed at [facility] urgent care  Ashlea Dusing B 06/15/2013 2:10 PM

## 2013-06-15 NOTE — Telephone Encounter (Signed)
Talked to daughter gave her appt for August with ML, nurse notified

## 2013-06-17 ENCOUNTER — Ambulatory Visit: Payer: PRIVATE HEALTH INSURANCE | Admitting: Cardiothoracic Surgery

## 2013-06-17 ENCOUNTER — Other Ambulatory Visit: Payer: PRIVATE HEALTH INSURANCE

## 2013-07-01 ENCOUNTER — Ambulatory Visit (HOSPITAL_BASED_OUTPATIENT_CLINIC_OR_DEPARTMENT_OTHER): Payer: Medicaid Other | Admitting: Nurse Practitioner

## 2013-07-01 ENCOUNTER — Telehealth: Payer: Self-pay | Admitting: Oncology

## 2013-07-01 VITALS — BP 112/70 | HR 75 | Temp 97.4°F | Resp 18 | Ht 63.0 in | Wt 127.7 lb

## 2013-07-01 DIAGNOSIS — K219 Gastro-esophageal reflux disease without esophagitis: Secondary | ICD-10-CM

## 2013-07-01 DIAGNOSIS — C155 Malignant neoplasm of lower third of esophagus: Secondary | ICD-10-CM

## 2013-07-01 MED ORDER — PANTOPRAZOLE SODIUM 40 MG PO TBEC: 40.0000 mg | DELAYED_RELEASE_TABLET | Freq: Every day | ORAL | Status: DC

## 2013-07-01 NOTE — Progress Notes (Signed)
OFFICE PROGRESS NOTE  Interval history:  Bruce [name] returns as scheduled. He denies any difficulty swallowing. He has a good appetite. He states that he feels "tired" after eating. He denies pain. He has periodic night sweats. His main complaint is of reflux after eating and at night time.   Objective: Blood pressure 112/70, pulse 75, temperature 97.4 F (36.3 C), temperature source Oral, resp. rate 18, height 5\' 3"  (1.6 m), weight 127 lb 11.2 oz (57.924 kg).  No thrush. No palpable cervical, supraclavicular, axillary or inguinal lymph nodes. Prominent axillary fat pads bilaterally. Inspiratory rales at both lung bases right greater than left. No respiratory distress. Regular cardiac rhythm. Abdomen soft and nontender. No hepatomegaly. Extremities without edema.  Lab Results: Lab Results  Component Value Date   WBC 4.8 09/01/2012   HGB 10.8* 09/01/2012   HCT 30.3* 09/01/2012   MCV 87.1 09/01/2012   PLT 176 09/01/2012    Chemistry:    Chemistry      Component Value Date/Time   NA 136 09/01/2012 0515   NA 138 07/14/2012 1138   K 4.0 09/01/2012 0515   K 3.9 07/14/2012 1138   CL 100 09/01/2012 0515   CL 107 07/14/2012 1138   CO2 29 09/01/2012 0515   CO2 23 07/14/2012 1138   BUN 23 09/01/2012 0515   BUN 10.0 07/14/2012 1138   CREATININE 0.74 09/01/2012 0515   CREATININE 0.9 07/14/2012 1138   CREATININE 1.10 04/20/2012 1647      Component Value Date/Time   CALCIUM 9.3 09/01/2012 0515   CALCIUM 9.0 07/14/2012 1138   ALKPHOS 111 08/24/2012 1126   AST 52* 08/24/2012 1126   ALT 56* 08/24/2012 1126   BILITOT 0.4 08/24/2012 1126       Studies/Results: Ct Chest W Contrast  06/15/2013   *RADIOLOGY REPORT*  Clinical Data: Esophageal cancer.  Followup.  Prior radiation and chemotherapy.  Tumor resection 2013.  CT CHEST WITH CONTRAST  Technique:  Multidetector CT imaging of the chest was performed following the standard protocol during bolus administration of intravenous contrast.  Contrast:  75mL OMNIPAQUE IOHEXOL 300 MG/ML  SOLN  Comparison: water-soluble swallow 09/02/2012.  Chest CT 06/02/2012.  Findings: The postoperative changes from esophagectomy and gastric pull-through procedure.  No visible recurrent the mass.  Scattered small mediastinal lymph nodes, none pathologically enlarged.  No hilar adenopathy.  Heart is normal size. Aorta is normal caliber. No mediastinal, hilar, or axillary adenopathy.  Small subpleural nodule noted in the posterior right lower lobe on image 26.  This measures 7 mm. There was atelectasis in this area previously and this nodule was not definitively seen.  Recommend attention on follow-up imaging. Mild subpleural reticulation throughout the lungs, likely scarring / fibrosis.  Mild COPD changes in the lung apices.  No pleural effusions.  Imaging into the upper abdomen shows no acute findings.  No acute or focal bony abnormality.  IMPRESSION: Postoperative changes from esophagectomy and gastric pull-through.  7 mm posterior subpleural nodule in the right lower lobe. Recommend attention on follow-up imaging.  COPD/chronic changes within the lungs.   Original Report Authenticated By: Charlett Nose, M.D.    Medications: I have reviewed the patient's current medications.  Assessment/Plan:  1. 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 Staging CT scans of the chest, abdomen and pelvis on 06/02/2012 negative for evidence of metastatic disease. Endoscopic ultrasound 06/11/2012 confirmed a uT3 uN1 tumor at 40 cm from the incisors. He began  radiation 06/15/2012. He began weekly Taxol/carboplatin chemotherapy 06/16/2012. He completed 6 weekly chemotherapy treatments. The carboplatin was held with week 5 due to thrombocytopenia. He completed the sixth weekly treatment with Taxol and carboplatin on 07/23/2012. He completed radiation on 07/29/2012 -he underwent an esophagectomy on 08/26/2012 with  the pathology confirming a ypT3,ypN0 squamous cell carcinoma with perineural invasion identified.  2. History of solid/liquid dysphagia secondary to the obstructing esophagus mass. Resolved. 3. Pruritus/rash when here 12/15/2012. He appeared to have an eczematoid rash over the trunk. Hydrocortisone ointment and oral Benadryl as needed were recommended. No skin rash noted at today's visit. 4. Reflux. He will begin Protonix 40 mg daily. 5. Chest CT 06/15/2013 with postoperative changes from esophagectomy and gastric pull-through. 7 mm posterior subpleural nodule in the right lower lobe. Followup CT scan at a six-month interval per Dr. Tyrone Sage.  Disposition-Bruce [name] appears stable. He is experiencing some reflux symptoms. He will begin Protonix as noted above.  He will return for a followup visit with Dr. Truett Perna in 4 months. He will contact the office in the interim with any problems.  Plan reviewed with Dr. Truett Perna.  Lonna Cobb ANP/GNP-BC

## 2013-07-01 NOTE — Telephone Encounter (Signed)
gave pt appt for Md only on December 2014

## 2013-08-12 ENCOUNTER — Encounter: Payer: Self-pay | Admitting: Oncology

## 2013-08-12 NOTE — Progress Notes (Signed)
Put disability form on nurse's desk for MD to fill out; notes read as though patient is not disabled.

## 2013-08-20 ENCOUNTER — Encounter: Payer: Self-pay | Admitting: Oncology

## 2013-08-20 NOTE — Progress Notes (Signed)
MD does not feel patient is disabled from a med onc standpoint and disability needs to go through the surgeon.

## 2013-11-02 ENCOUNTER — Other Ambulatory Visit: Payer: Self-pay | Admitting: *Deleted

## 2013-11-02 ENCOUNTER — Telehealth: Payer: Self-pay | Admitting: *Deleted

## 2013-11-02 ENCOUNTER — Ambulatory Visit: Payer: Medicaid Other | Admitting: Oncology

## 2013-11-02 NOTE — Telephone Encounter (Signed)
Attempted to call pt via [employer]. No answer/ no voicemail. POF sent to schedulers to reschedule today's missed appt.

## 2013-11-04 ENCOUNTER — Telehealth: Payer: Self-pay | Admitting: Oncology

## 2013-11-04 NOTE — Telephone Encounter (Signed)
worked 12/16 POF appt made w [facility] for 12/06/13 Calendar mailed to pt shh

## 2013-11-22 ENCOUNTER — Other Ambulatory Visit: Payer: Self-pay

## 2013-11-22 DIAGNOSIS — C159 Malignant neoplasm of esophagus, unspecified: Secondary | ICD-10-CM

## 2013-12-06 ENCOUNTER — Ambulatory Visit (HOSPITAL_BASED_OUTPATIENT_CLINIC_OR_DEPARTMENT_OTHER): Payer: BC Managed Care – PPO | Admitting: Oncology

## 2013-12-06 ENCOUNTER — Telehealth: Payer: Self-pay | Admitting: Oncology

## 2013-12-06 ENCOUNTER — Other Ambulatory Visit: Payer: Self-pay | Admitting: *Deleted

## 2013-12-06 VITALS — BP 102/78 | HR 72 | Temp 97.3°F | Resp 18 | Ht 63.0 in | Wt 130.3 lb

## 2013-12-06 DIAGNOSIS — R1310 Dysphagia, unspecified: Secondary | ICD-10-CM

## 2013-12-06 DIAGNOSIS — K219 Gastro-esophageal reflux disease without esophagitis: Secondary | ICD-10-CM

## 2013-12-06 DIAGNOSIS — R079 Chest pain, unspecified: Secondary | ICD-10-CM

## 2013-12-06 DIAGNOSIS — C155 Malignant neoplasm of lower third of esophagus: Secondary | ICD-10-CM

## 2013-12-06 DIAGNOSIS — M549 Dorsalgia, unspecified: Secondary | ICD-10-CM

## 2013-12-06 DIAGNOSIS — R21 Rash and other nonspecific skin eruption: Secondary | ICD-10-CM

## 2013-12-06 MED ORDER — PANTOPRAZOLE SODIUM 40 MG PO TBEC: 40.0000 mg | DELAYED_RELEASE_TABLET | Freq: Every day | ORAL | Status: DC

## 2013-12-06 NOTE — Telephone Encounter (Signed)
Gave pt appt for lab and MD on May 2015

## 2013-12-06 NOTE — Progress Notes (Signed)
   [place]    OFFICE PROGRESS NOTE   INTERVAL HISTORY:   He returns for scheduled followup of esophagus cancer. He reports a good appetite and eats approximately every 2 hours. He has intermittent pain at the bilateral upper back. No consistent pain. He complains of solid dysphagia and subxiphoid discomfort after eating. He also has shortness of breath after eating a large meal.  Objective:  Vital signs in last 24 hours:  Blood pressure 102/78, pulse 72, temperature 97.3 F (36.3 C), temperature source Oral, resp. rate 18, height 5\' 3"  (1.6 m), weight 130 lb 4.8 oz (59.104 kg), SpO2 100.00%.    HEENT: Neck without mass Lymphatics: No cervical, supraclavicular, or axillary nodes Resp: fine end inspiratory rales at the posterior base bilaterally, no respiratory distress Cardio: Regular rate and rhythm GI: No hepatomegaly, nontender, no mass Vascular: No leg edema   Medications: I have reviewed the patient's current medications.  Assessment/Plan: 1. 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 Staging CT scans of the chest, abdomen and pelvis on 06/02/2012 negative for evidence of metastatic disease. Endoscopic ultrasound 06/11/2012 confirmed a uT3 uN1 tumor at 40 cm from the incisors. He began radiation 06/15/2012. He began weekly Taxol/carboplatin chemotherapy 06/16/2012. He completed 6 weekly chemotherapy treatments. The carboplatin was held with week 5 due to thrombocytopenia. He completed the sixth weekly treatment with Taxol and carboplatin on 07/23/2012. He completed radiation on 07/29/2012 -he underwent an esophagectomy on 08/26/2012 with the pathology confirming a ypT3,ypN0 squamous cell carcinoma with perineural invasion identified.  2. History of solid/liquid dysphagia secondary to the obstructing esophagus mass. He reports recurrent solid dysphagia 3. Pruritus/rash when here  12/15/2012. He appeared to have an eczematoid rash over the trunk. Hydrocortisone ointment and oral Benadryl as needed were recommended. No skin rash noted at today's visit. 4. Reflux. He is taking Protonix 5. Chest CT 06/15/2013 with postoperative changes from esophagectomy and gastric pull-through. 7 mm posterior subpleural nodule in the right lower lobe. Followup CT scan scheduled for 12/16/2013   Disposition:  He remains in clinical remission from esophagus cancer. 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 He is scheduled to see Dr. Servando Snare next week. He will discuss the indication for a barium swallow/upper endoscopy with Dr. Servando Snare  Mr.Gilbo will return for an office visit in 4 months. He will contact us for consistent pain.   Betsy Coder, MD  12/06/2013  10:52 AM

## 2013-12-16 ENCOUNTER — Encounter: Payer: Self-pay | Admitting: Cardiothoracic Surgery

## 2013-12-16 ENCOUNTER — Ambulatory Visit
Admission: RE | Admit: 2013-12-16 | Discharge: 2013-12-16 | Disposition: A | Discharge status: Home / Self Care | Payer: BC Managed Care – PPO | Source: Ambulatory Visit | Attending: Cardiothoracic Surgery | Admitting: Cardiothoracic Surgery

## 2013-12-16 ENCOUNTER — Ambulatory Visit (INDEPENDENT_AMBULATORY_CARE_PROVIDER_SITE_OTHER): Payer: BC Managed Care – PPO | Admitting: Cardiothoracic Surgery

## 2013-12-16 VITALS — BP 104/68 | HR 68 | Resp 16 | Ht 63.0 in | Wt 133.0 lb

## 2013-12-16 DIAGNOSIS — Z9049 Acquired absence of other specified parts of digestive tract: Secondary | ICD-10-CM

## 2013-12-16 DIAGNOSIS — Z9889 Other specified postprocedural states: Secondary | ICD-10-CM

## 2013-12-16 DIAGNOSIS — C159 Malignant neoplasm of esophagus, unspecified: Secondary | ICD-10-CM

## 2013-12-16 MED ORDER — IOHEXOL 300 MG/ML  SOLN
75.0000 mL | Freq: Once | INTRAMUSCULAR | Status: AC | PRN
  Administered 2013-12-16: 75 mL via INTRAVENOUS

## 2013-12-16 NOTE — Patient Instructions (Signed)
To have barium swallow To have needle biopsy of right lung lesion

## 2013-12-16 NOTE — Progress Notes (Signed)
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  SURGICAL PROCEDURE: Bronchoscopy, transhiatal total esophagectomy with  cervical esophagogastrostomy, pyloroplasty, and placement of feeding  jejunostomy. Taxol/carboplatin chemotherapy 06/16/2012 and radiation preop ypT3,ypN0 squamous cell carcinoma with perineural invasion identified Stage IIb  History of Present Illness:     The patient returns to the office today after esophagectomy in early October 2013 . He has been doing relatively well . He continues to eat multiple small meals. He has some dumping episodes he had have resolved by morning very products and sweets, feeling weak after he eats. He also feels like something is sticking in his throat occasionally when he swallows, but this does not cause difficulty in getting liquids or solids down.    Status post endoscopic biopsy on 05/28/2012 with pathology confirming invasive squamous cell carcinoma. Staging CT scans of the chest, abdomen and pelvis on 06/02/2012 negative for evidence of metastatic disease. Endoscopic ultrasound 06/11/2012 confirmed a uT3 uN1 tumor at 40 cm from the incisors. He began radiation 06/15/2012. He began weekly . He completed 6 weekly chemotherapy treatments. The carboplatin was held with week 5 due to thrombocytopenia. He completed the sixth weekly treatment with Taxol and carboplatin on 07/23/2012. He completed radiation on 07/29/2012 -he underwent an esophagectomy on 08/26/2012 with the pathology confirming a ypT3,ypN0 squamous cell carcinoma with perineural invasion identified.    History  Smoking status  . Former Smoker -- 0.40 packs/day  . Types:  Cigarettes  . Quit date: 06/19/2011  Smokeless tobacco  . Former Systems developer  . Quit date: 06/03/2007       No Known Allergies  Current Outpatient Prescriptions  Medication Sig Dispense Refill  . pantoprazole (PROTONIX) 40 MG tablet Take 1 tablet (40 mg total) by mouth daily.  30 tablet  11   No current facility-administered medications for this visit.   Wt Readings from Last 3 Encounters:  12/06/13 130 lb 4.8 oz (59.104 kg)  07/01/13 127 lb 11.2 oz (57.924 kg)  06/15/13 128 lb (58.06 kg)      Physical Exam: There were no vitals taken for this visit.  General appearance: alert and cooperative Neurologic: intact Heart: regular rate and rhythm, S1, S2 normal, no murmur, click, rub or gallop and normal apical impulse Lungs: clear to auscultation bilaterally and normal percussion bilaterally Abdomen: soft, non-tender; bowel sounds normal; no masses,  no organomegaly, there is no incisional hernia, he does have an area in the midportion of the abdominal incision its irritated and has probable related to the Prolene suture used for wound closure. Extremities: extremities normal, atraumatic, no cyanosis or edema and Homans sign is negative, no sign of DVT Wound: Well-healed abdominal and neck incision. He has no cervical or supraclavicular adenopathy    Recent Radiology Findings: Ct Chest W Contrast  12/16/2013   CLINICAL DATA:  History of esophageal cancer  EXAM: CT CHEST WITH CONTRAST  TECHNIQUE: Multidetector CT imaging of the chest was performed during intravenous contrast administration.  CONTRAST:  74mL OMNIPAQUE IOHEXOL 300 MG/ML  SOLN  COMPARISON:  06/15/2013  FINDINGS: The heart size appears normal. There is no pericardial effusion identified.  Postoperative changes from esophagectomy and gastric pull up identified. Right hilar lymph node measures 1.8 cm, image 30/series 3. Previously this measured 0.9 cm.  No pleural effusion identified. Pulmonary nodule within the posterior right  upper lobe measures 1.5 cm. Previously this nodule measured 0.7 cm. Central area of cavitation is noted on today's exam. Peripheral interstitial reticulation appears lower lobe predominant. There may be mild subpleural honeycombing in the right base. Traction bronchiectasis is identified. No enlarged supraclavicular or axillary lymph nodes identified. Incidental imaging through the upper abdomen shows no acute findings. The adrenal glands are both normal.  Review of the visualized osseous structures is remarkable for mild thoracic spondylosis. No aggressive lytic or sclerotic bone lesions identified.  IMPRESSION: 1. Increase in size of right hilar lymph node and right upper lobe pulmonary nodule compatible with recurrence of tumor. 2. Interstitial lung abnormality which may represent early usual interstitial pneumonitis or nonspecific interstitial pneumonitis.   Electronically Signed   By: Kerby Moors M.D.   On: 12/16/2013 09:39   Ct Chest W Contrast  06/15/2013   *RADIOLOGY REPORT*  Clinical Data: Esophageal cancer.  Followup.  Prior radiation and chemotherapy.  Tumor resection 2013.  CT CHEST WITH CONTRAST  Technique:  Multidetector CT imaging of the chest was performed following the standard protocol during bolus administration of intravenous contrast.  Contrast: 90mL OMNIPAQUE IOHEXOL 300 MG/ML  SOLN  Comparison: water-soluble swallow 09/02/2012.  Chest CT 06/02/2012.  Findings: The postoperative changes from esophagectomy and gastric pull-through procedure.  No visible recurrent the mass.  Scattered small mediastinal lymph nodes, none pathologically enlarged.  No hilar adenopathy.  Heart is normal size. Aorta is normal caliber. No mediastinal, hilar, or axillary adenopathy.  Small subpleural nodule noted in the posterior right lower lobe on image 26.  This measures 7 mm. There was atelectasis in this area previously and this nodule was not definitively seen.  Recommend attention on follow-up imaging. Mild  subpleural reticulation throughout the lungs, likely scarring / fibrosis.  Mild COPD changes in the lung apices.  No pleural effusions.  Imaging into the upper abdomen shows no acute findings.  No acute or focal bony abnormality.  IMPRESSION: Postoperative changes from esophagectomy and gastric pull-through.  7 mm posterior subpleural nodule in the right lower lobe. Recommend attention on follow-up imaging.  COPD/chronic changes within the lungs.   Original Report Authenticated By: Rolm Baptise, M.D.   Recent Labs: Lab Results  Component Value Date   WBC 4.8 09/01/2012   HGB 10.8* 09/01/2012   HCT 30.3* 09/01/2012   PLT 176 09/01/2012   GLUCOSE 107* 09/01/2012   ALT 56* 08/24/2012   AST 52* 08/24/2012   NA 136 09/01/2012   K 4.0 09/01/2012   CL 100 09/01/2012   CREATININE 0.74 09/01/2012   BUN 23 09/01/2012   CO2 29 09/01/2012   INR 0.97 08/24/2012      Assessment / Plan:   Status post surgical resection of  ypT3,ypN0 squamous cell carcinoma of the esophagus with perineural invasion identified Stage IIb Now with enlarging right lower lobe lung nodule and right hilar adenopathy. I have reviewed the films with the patient through the kidneys interpreter and explained to him that this is concerning for recurrent esophageal cancer We will arrange for a barium swallow and CT guided needle biopsy of the right lung nodule. He will return to the office next week after these 2 studies have been completed   Eartha Vonbehren B 12/16/2013 9:52 AM

## 2013-12-17 ENCOUNTER — Other Ambulatory Visit: Payer: Self-pay

## 2013-12-17 DIAGNOSIS — D381 Neoplasm of uncertain behavior of trachea, bronchus and lung: Secondary | ICD-10-CM

## 2013-12-17 DIAGNOSIS — C159 Malignant neoplasm of esophagus, unspecified: Secondary | ICD-10-CM

## 2013-12-22 ENCOUNTER — Ambulatory Visit
Admission: RE | Admit: 2013-12-22 | Discharge: 2013-12-22 | Disposition: A | Discharge status: Home / Self Care | Payer: BC Managed Care – PPO | Source: Ambulatory Visit | Attending: Cardiothoracic Surgery | Admitting: Cardiothoracic Surgery

## 2013-12-22 ENCOUNTER — Other Ambulatory Visit (HOSPITAL_COMMUNITY): Payer: Self-pay | Admitting: Radiology

## 2013-12-22 DIAGNOSIS — C159 Malignant neoplasm of esophagus, unspecified: Secondary | ICD-10-CM

## 2013-12-24 ENCOUNTER — Encounter (HOSPITAL_COMMUNITY): Payer: Self-pay | Admitting: Pharmacy Technician

## 2013-12-27 ENCOUNTER — Encounter (HOSPITAL_COMMUNITY): Payer: Self-pay

## 2013-12-27 ENCOUNTER — Ambulatory Visit (HOSPITAL_COMMUNITY)
Admission: RE | Admit: 2013-12-27 | Discharge: 2013-12-27 | Disposition: A | Discharge status: Home / Self Care | Payer: BC Managed Care – PPO | Source: Ambulatory Visit | Attending: Cardiothoracic Surgery | Admitting: Cardiothoracic Surgery

## 2013-12-27 ENCOUNTER — Ambulatory Visit (HOSPITAL_COMMUNITY)
Admission: RE | Admit: 2013-12-27 | Discharge: 2013-12-27 | Disposition: A | Discharge status: Home / Self Care | Payer: BC Managed Care – PPO | Source: Ambulatory Visit | Attending: Interventional Radiology | Admitting: Interventional Radiology

## 2013-12-27 DIAGNOSIS — Z87891 Personal history of nicotine dependence: Secondary | ICD-10-CM | POA: Insufficient documentation

## 2013-12-27 DIAGNOSIS — Z8501 Personal history of malignant neoplasm of esophagus: Secondary | ICD-10-CM | POA: Insufficient documentation

## 2013-12-27 DIAGNOSIS — C3490 Malignant neoplasm of unspecified part of unspecified bronchus or lung: Secondary | ICD-10-CM

## 2013-12-27 DIAGNOSIS — D381 Neoplasm of uncertain behavior of trachea, bronchus and lung: Secondary | ICD-10-CM

## 2013-12-27 DIAGNOSIS — C7800 Secondary malignant neoplasm of unspecified lung: Secondary | ICD-10-CM | POA: Insufficient documentation

## 2013-12-27 HISTORY — DX: Malignant neoplasm of unspecified part of unspecified bronchus or lung: C34.90

## 2013-12-27 LAB — CBC
HCT: 41.4 % (ref 39.0–52.0)
HEMOGLOBIN: 14.7 g/dL (ref 13.0–17.0)
MCH: 31.8 pg (ref 26.0–34.0)
MCHC: 35.5 g/dL (ref 30.0–36.0)
MCV: 89.6 fL (ref 78.0–100.0)
PLATELETS: 184 10*3/uL (ref 150–400)
RBC: 4.62 MIL/uL (ref 4.22–5.81)
RDW: 13.3 % (ref 11.5–15.5)
WBC: 5.8 10*3/uL (ref 4.0–10.5)

## 2013-12-27 LAB — APTT: aPTT: 34 seconds (ref 24–37)

## 2013-12-27 LAB — PROTIME-INR
INR: 0.94 (ref 0.00–1.49)
PROTHROMBIN TIME: 12.4 s (ref 11.6–15.2)

## 2013-12-27 MED ORDER — MIDAZOLAM HCL 2 MG/2ML IJ SOLN
INTRAMUSCULAR | Status: AC
  Filled 2013-12-27: qty 4

## 2013-12-27 MED ORDER — FENTANYL CITRATE 0.05 MG/ML IJ SOLN
INTRAMUSCULAR | Status: AC | PRN
  Administered 2013-12-27: 25 ug via INTRAVENOUS
  Administered 2013-12-27: 50 ug via INTRAVENOUS

## 2013-12-27 MED ORDER — SODIUM CHLORIDE 0.9 % IV SOLN
Freq: Once | INTRAVENOUS | Status: AC
  Administered 2013-12-27: 08:00:00 via INTRAVENOUS

## 2013-12-27 MED ORDER — MIDAZOLAM HCL 2 MG/2ML IJ SOLN
INTRAMUSCULAR | Status: AC | PRN
  Administered 2013-12-27: 0.5 mg via INTRAVENOUS
  Administered 2013-12-27: 1 mg via INTRAVENOUS

## 2013-12-27 MED ORDER — FENTANYL CITRATE 0.05 MG/ML IJ SOLN
INTRAMUSCULAR | Status: AC
  Filled 2013-12-27: qty 4

## 2013-12-27 MED ORDER — LIDOCAINE HCL 1 % IJ SOLN
INTRAMUSCULAR | Status: AC
  Filled 2013-12-27: qty 10

## 2013-12-27 MED ORDER — LIDOCAINE-EPINEPHRINE 1 %-1:100000 IJ SOLN
INTRAMUSCULAR | Status: AC
  Filled 2013-12-27: qty 1

## 2013-12-27 NOTE — Progress Notes (Signed)
Pt had some bleeding noted at bandaid when port chest xray board was removed.  Site no longer bleeding but feels slightly boggy at puncture site and 5cm diameter around site.  VSS. No resp distrss. Sats 97% on R/A.  PA Korine notified and came to assess pt.

## 2013-12-27 NOTE — H&P (Signed)
Chief Complaint: "I am here for a right lung biopsy." Referring Physician: Dr. Servando Snare HPI: Bruce [name] is an 53 y.o. male with PMHx of esophageal cancer, CT chest 12/16/13 revealed increased size of right hilar lymph node and right upper lobe lung nodule. Request received for image guided right upper lobe lung nodule biopsy. He denies any chest pain, shortness of breath or hemoptysis. He denies any active bleeding or taking any blood thinners. He denies any recent illness, fever or chills. He does admit to difficulty swallowing. He denies any history of OSA or chronic oxygen use. He denies any complications with previous sedation. The patient is a former tobacco user.   Past Medical History:  Past Medical History  Diagnosis Date  . Esophagitis   . Mass of esophagus 05/28/2012    BX'D DISTAL ESOPHAGUS,PENDING  . Dysphasia     solid and liquid  . Neuropathy     right hand numbness 1 year 2012  . Cancer 05/28/12    bx=esophagus=squamous cell carcinoma    Past Surgical History:  Past Surgical History  Procedure Laterality Date  . 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 Eus  06/11/2012    Procedure: UPPER ENDOSCOPIC ULTRASOUND (EUS) RADIAL;  Surgeon: Milus Banister, MD;  Location: [facility];  Service: Endoscopy;  Laterality: N/A;  . Complete esophagectomy  08/26/2012    Procedure: ESOPHAGECTOMY COMPLETE;  Surgeon: [name], MD;  Location: [place];  Service: Thoracic;  Laterality: N/A;  transhiatal   . Pyloroplasty  08/26/2012    Procedure: PYLOROPLASTY;  Surgeon: [name], MD;  Location: [place];  Service: Thoracic;  Laterality: N/A;  . Video bronchoscopy  08/26/2012    Procedure: VIDEO BRONCHOSCOPY;  Surgeon: [name], MD;  Location: [place];  Service: Thoracic;  Laterality: N/A;  . Jejunostomy  08/26/2012    Procedure: JEJUNOSTOMY;  Surgeon: [name], MD;  Location: [facility] OR;   Service: Thoracic;  Laterality: N/A;  placement of feeding jujunostomy tube    Family History:  Family History  Problem Relation Age of Onset  . Breast cancer Sister     Social History:  reports that he quit smoking about 2 years ago. His smoking use included Cigarettes. He smoked 0.40 packs per day. He quit smokeless tobacco use about 6 years ago. He reports that he drinks about 8.4 ounces of alcohol per week. He reports that he does not use illicit drugs.  Allergies: No Known Allergies  Medications:   Medication List    ASK your doctor about these medications       pantoprazole 40 MG tablet  Commonly known as:  PROTONIX  Take 1 tablet (40 mg total) by mouth daily.       Please HPI for pertinent positives, otherwise complete 10 system ROS negative.  Physical Exam: BP 125/77  Pulse 70  Temp(Src) 97.6 F (36.4 C) (Oral)  Resp 16  Ht 5\' 3"  (1.6 m)  Wt 130 lb (58.968 kg)  BMI 23.03 kg/m2  SpO2 100% Body mass index is 23.03 kg/(m^2).  General Appearance:  Alert, cooperative, no distress  Head:  Normocephalic, without obvious abnormality, atraumatic  Neck: Supple, symmetrical, trachea midline  Lungs:   Clear to auscultation bilaterally, no w/r/r, respirations unlabored without use of accessory muscles.  Chest Wall:  No tenderness or deformity  Heart:  Regular rate and rhythm, S1, S2 normal, no murmur, rub or  gallop.  Abdomen:   Soft, non-tender, non distended, (+) BS  Extremities: Extremities normal, atraumatic, no cyanosis or edema  Pulses: 2+ and symmetric  Neurologic: Normal affect, no gross deficits.   Results for orders placed during the hospital encounter of 12/27/13 (from the past 48 hour(s))  APTT     Status: None   Collection Time    12/27/13  7:17 AM      Result Value Range   aPTT 34  24 - 37 seconds  CBC     Status: None   Collection Time    12/27/13  7:17 AM      Result Value Range   WBC 5.8  4.0 - 10.5 K/uL   RBC 4.62  4.22 - 5.81 MIL/uL    Hemoglobin 14.7  13.0 - 17.0 g/dL   HCT 41.4  39.0 - 52.0 %   MCV 89.6  78.0 - 100.0 fL   MCH 31.8  26.0 - 34.0 pg   MCHC 35.5  30.0 - 36.0 g/dL   RDW 13.3  11.5 - 15.5 %   Platelets 184  150 - 400 K/uL  PROTIME-INR     Status: None   Collection Time    12/27/13  7:17 AM      Result Value Range   Prothrombin Time 12.4  11.6 - 15.2 seconds   INR 0.94  0.00 - 1.49   No results found.  Assessment/Plan Right upper lobe lung nodule on CT chest 12/16/13 Increased size of right hilar lymph node. History of esophageal cancer. Request for image guided right upper lobe lung nodule biopsy. Patient has been NPO, images and labs reviewed, no blood thinners. Risks and Benefits discussed with the patient with the daughter's help of interpreting. Risks including, but not limited to bleeding, infection, collapse of the lung or even death.  All of the patient's questions were answered, patient is agreeable to proceed. Consent signed and in chart.   Tsosie Billing D PA-C 12/27/2013, 8:52 AM

## 2013-12-27 NOTE — Progress Notes (Signed)
Pt has had no further bleeding from site. VS remain stable. Korine, radiology PA reassessed pt and oked to be discharged

## 2013-12-27 NOTE — Discharge Instructions (Signed)
Needle Biopsy of Lung, Care After Refer to this sheet in the next few weeks. These instructions provide you with information on caring for yourself after your procedure. Your health care provider may also give you more specific instructions. Your treatment has been planned according to current medical practices, but problems sometimes occur. Call your health care provider if you have any problems or questions after your procedure. WHAT TO EXPECT AFTER THE PROCEDURE A bandage will be applied over the areas where the needle was inserted. You may be asked to apply pressure to the bandage for several minutes to ensure there is minimal bleeding. In most cases, you can leave when your needle biopsy procedure is completed. Do not drive yourself home. Someone else should take you home. If you received an IV sedative or general anesthetic, you will be taken to a comfortable place to relax while the medication wears off. If you have upcoming travel scheduled, talk to your doctor about when it is safe to travel by air after the procedure. HOME CARE INSTRUCTIONS Expect to take it easy for the rest of the day. Protect the area where you received the needle biopsy by keeping the bandage in place for as long as instructed. You may feel some mild pain or discomfort in the area, but this should stop in a day or two. Only take over-the-counter or prescription medicines for pain, discomfort, or fever as directed by your caregiver. SEEK MEDICAL CARE IF:   You have pain at the biopsy site that worsens or is not helped by medication.  You have swelling or drainage at the needle biopsy site.  You have a fever. SEEK IMMEDIATE MEDICAL CARE IF:   You have new or worsening shortness of breath.  You have chest pain.  You are coughing up blood.  You have bleeding that does not stop with pressure or a bandage.  You develop light-headedness or fainting. Document Released: 09/01/2007 Document Revised: 07/07/2013 Document  Reviewed: 03/29/2013 [facility] Patient Information 2014 [place].

## 2013-12-27 NOTE — Procedures (Signed)
Technically successful CT guided biopsy of centrally cavitary right upper lobe nodule.  No immediate post procedural complications.

## 2013-12-30 ENCOUNTER — Other Ambulatory Visit: Payer: Self-pay | Admitting: *Deleted

## 2013-12-30 ENCOUNTER — Telehealth: Payer: Self-pay | Admitting: *Deleted

## 2013-12-30 ENCOUNTER — Ambulatory Visit (INDEPENDENT_AMBULATORY_CARE_PROVIDER_SITE_OTHER): Payer: BC Managed Care – PPO | Admitting: Cardiothoracic Surgery

## 2013-12-30 DIAGNOSIS — C155 Malignant neoplasm of lower third of esophagus: Secondary | ICD-10-CM

## 2013-12-30 DIAGNOSIS — R918 Other nonspecific abnormal finding of lung field: Secondary | ICD-10-CM

## 2013-12-30 DIAGNOSIS — R222 Localized swelling, mass and lump, trunk: Secondary | ICD-10-CM

## 2013-12-30 NOTE — Telephone Encounter (Signed)
Called and spoke with pt daughter.  I notified her of upcoming PET scan appt and back to see Dr. Benay Spice 01/10/14.  I stated schedulers will call with appt times.  She verbalized understanding

## 2013-12-31 ENCOUNTER — Encounter: Payer: Self-pay | Admitting: Cardiothoracic Surgery

## 2013-12-31 DIAGNOSIS — R918 Other nonspecific abnormal finding of lung field: Secondary | ICD-10-CM | POA: Insufficient documentation

## 2013-12-31 NOTE — Progress Notes (Signed)
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  SURGICAL PROCEDURE: Bronchoscopy, transhiatal total esophagectomy with  cervical esophagogastrostomy, pyloroplasty, and placement of feeding  jejunostomy. Taxol/carboplatin chemotherapy 06/16/2012 and radiation preop ypT3,ypN0 squamous cell carcinoma with perineural invasion identified Stage IIb  History of Present Illness:     The patient returns to the office today  after needle bx of enlarging rt lung mass and esophageal swallow. He had  esophagectomy in early October 2013 . He has been doing relatively well . He continues to eat multiple small meals. He has some dumping episodes he had have resolved by morning very products and sweets, feeling weak after he eats. He also feels like something is sticking in his throat occasionally when he swallows, but this does not cause difficulty in getting liquids or solids down.    Status post endoscopic biopsy on 05/28/2012 with pathology confirming invasive squamous cell carcinoma. Staging CT scans of the chest, abdomen and pelvis on 06/02/2012 negative for evidence of metastatic disease. Endoscopic ultrasound 06/11/2012 confirmed a uT3 uN1 tumor at 40 cm from the incisors. He began radiation 06/15/2012. He began weekly . He completed 6 weekly chemotherapy treatments. The carboplatin was held with week 5 due to thrombocytopenia. He completed the sixth weekly treatment with Taxol and carboplatin on 07/23/2012. He completed radiation on 07/29/2012 -he underwent an esophagectomy on 08/26/2012 with the pathology confirming a ypT3,ypN0 squamous cell carcinoma with perineural invasion  identified.    History  Smoking status  . Former Smoker -- 0.40 packs/day  . Types: Cigarettes  . Quit date: 06/19/2011  Smokeless tobacco  . Former Systems developer  . Quit date: 06/03/2007       No Known Allergies  Current Outpatient Prescriptions  Medication Sig Dispense Refill  . pantoprazole (PROTONIX) 40 MG tablet Take 1 tablet (40 mg total) by mouth daily.  30 tablet  11   No current facility-administered medications for this visit.   Wt Readings from Last 3 Encounters:  12/27/13 130 lb (58.968 kg)  12/16/13 133 lb (60.328 kg)  12/06/13 130 lb 4.8 oz (59.104 kg)      Physical Exam: There were no vitals taken for this visit.  General appearance: alert and cooperative Neurologic: intact Heart: regular rate and rhythm, S1, S2 normal, no murmur, click, rub or gallop and normal apical impulse Lungs: clear to auscultation bilaterally and normal percussion bilaterally Abdomen: soft, non-tender; bowel sounds normal; no masses,  no organomegaly, there is no incisional hernia, he does have an area in the midportion of the abdominal incision its irritated and has probable related to the Prolene suture used for wound closure. Extremities: extremities normal, atraumatic, no cyanosis or edema and Homans sign is negative, no sign of DVT Wound: Well-healed abdominal and neck incision. He has no cervical or supraclavicular adenopathy    Recent Radiology Findings:  Dg Esophagus  12/22/2013   CLINICAL DATA:  Esophageal cancer  EXAM: ESOPHOGRAM/BARIUM SWALLOW  TECHNIQUE: Single contrast examination was performed using  thin barium.  COMPARISON:  09/02/2012  FLUOROSCOPY TIME:  54 seconds  FINDINGS: Normal oral phase of swallowing.  No laryngeal penetration or tracheobronchial  aspiration.  Status post esophagectomy with gastric pull-through.  Layering fluid/debris in the distal gastric pull-through.  IMPRESSION: Satisfactory appearance status post esophagectomy with gastric pull through.    Electronically Signed   By: Julian Hy M.D.   On: 12/22/2013 11:53   Ct Biopsy  12/27/2013   INDICATION: History of esophageal cancer, now with enlarging centrally necrotic nodule within the right upper lobe. Please obtain biopsy to evaluate for either metastatic disease versus primary lung cancer.  EXAM: CT BIOPSY  MEDICATIONS: Fentanyl 75 mcg IV; Versed 1.5 mg IV  ANESTHESIA/SEDATION: Sedation time  20 minutes  CONTRAST:  None  COMPARISON:  NM PET IMAGE INITIAL (PI) SKULL BASE TO THIGH dated 06/11/2012; CT CHEST W/CM dated 12/16/2013; CT CHEST W/CM dated 06/15/2013  PROCEDURE: Informed consent was obtained from the patient (via the use of a medical translator) following an explanation of the procedure, risks, benefits and alternatives. The patient understands,agrees and consents for the procedure. All questions were addressed. A time out was performed prior to the initiation of the procedure.  The patient was positioned right lateral decubitus on the CT table and a limited chest CT was performed for procedural planning demonstrating no change to minimal increase in size of the now approximately 1.7 x 1.4 cm nodule within the right upper lobe (image 5, series 3). Note, there has been interval increased central cavitation of this nodule. The operative site was prepped and draped in the usual sterile fashion. Under sterile conditions and local anesthesia, a 22 gauge spinal needle was utilized for procedural planning. This was followed by the placement of a 17 gauge coaxial needle into the peripheral caudal aspect of the nodule. Positioning was confirmed with intermittent CT fluoroscopy and followed by the acquisition of 2 core needle biopsies with an 18 gauge core needle biopsy device.  Limited post procedural chest CT was negative for pneumothorax or additional complication. The co-axial needle was removed and hemostasis was achieved with manual compression. A dressing was placed. The patient tolerated the  procedure well without immediate postprocedural complication. The patient was escorted to have an upright chest radiograph.  COMPLICATIONS: None immediate.  IMPRESSION: Technically successful CT guided core needle core biopsy of enlarging centrally necrotic nodule within the right upper lobe.   Electronically Signed   By: Sandi Mariscal M.D.   On: 12/27/2013 13:19   Ct Chest W Contrast  12/16/2013   CLINICAL DATA:  History of esophageal cancer  EXAM: CT CHEST WITH CONTRAST  TECHNIQUE: Multidetector CT imaging of the chest was performed during intravenous contrast administration.  CONTRAST:  31mL OMNIPAQUE IOHEXOL 300 MG/ML  SOLN  COMPARISON:  06/15/2013  FINDINGS: The heart size appears normal. There is no pericardial effusion identified. Postoperative changes from esophagectomy and gastric pull up identified. Right hilar lymph node measures 1.8 cm, image 30/series 3. Previously this measured 0.9 cm.  No pleural effusion identified. Pulmonary nodule within the posterior right upper lobe measures 1.5 cm. Previously this nodule measured 0.7 cm. Central area of cavitation is noted on today's exam. Peripheral interstitial reticulation appears lower lobe predominant. There may be mild subpleural honeycombing in the right base. Traction bronchiectasis is identified. No enlarged supraclavicular or axillary lymph nodes identified. Incidental imaging through the upper abdomen shows no acute findings. The adrenal glands are both normal.  Review of the visualized osseous structures is remarkable for mild thoracic spondylosis. No aggressive lytic or sclerotic bone lesions identified.  IMPRESSION: 1. Increase in size of right hilar lymph node and right  upper lobe pulmonary nodule compatible with recurrence of tumor. 2. Interstitial lung abnormality which may represent early usual interstitial pneumonitis or nonspecific interstitial pneumonitis.   Electronically Signed   By: Kerby Moors M.D.   On: 12/16/2013 09:39   Ct Chest  W Contrast  06/15/2013   *RADIOLOGY REPORT*  Clinical Data: Esophageal cancer.  Followup.  Prior radiation and chemotherapy.  Tumor resection 2013.  CT CHEST WITH CONTRAST  Technique:  Multidetector CT imaging of the chest was performed following the standard protocol during bolus administration of intravenous contrast.  Contrast: 68mL OMNIPAQUE IOHEXOL 300 MG/ML  SOLN  Comparison: water-soluble swallow 09/02/2012.  Chest CT 06/02/2012.  Findings: The postoperative changes from esophagectomy and gastric pull-through procedure.  No visible recurrent the mass.  Scattered small mediastinal lymph nodes, none pathologically enlarged.  No hilar adenopathy.  Heart is normal size. Aorta is normal caliber. No mediastinal, hilar, or axillary adenopathy.  Small subpleural nodule noted in the posterior right lower lobe on image 26.  This measures 7 mm. There was atelectasis in this area previously and this nodule was not definitively seen.  Recommend attention on follow-up imaging. Mild subpleural reticulation throughout the lungs, likely scarring / fibrosis.  Mild COPD changes in the lung apices.  No pleural effusions.  Imaging into the upper abdomen shows no acute findings.  No acute or focal bony abnormality.  IMPRESSION: Postoperative changes from esophagectomy and gastric pull-through.  7 mm posterior subpleural nodule in the right lower lobe. Recommend attention on follow-up imaging.  COPD/chronic changes within the lungs.   Original Report Authenticated By: Rolm Baptise, M.D.   Recent Labs: Lab Results  Component Value Date   WBC 5.8 12/27/2013   HGB 14.7 12/27/2013   HCT 41.4 12/27/2013   PLT 184 12/27/2013   GLUCOSE 107* 09/01/2012   ALT 56* 08/24/2012   AST 52* 08/24/2012   NA 136 09/01/2012   K 4.0 09/01/2012   CL 100 09/01/2012   CREATININE 0.74 09/01/2012   BUN 23 09/01/2012   CO2 29 09/01/2012   INR 0.94 12/27/2013   PATH: Diagnosis Lung, biopsy, Centrally cavitary right upper lobe - INVASIVE SQUAMOUS  CELL CARCINOMA. - SEE COMMENT. Microscopic Comment Carcinoma indicates the following immunophenotype TTF-1- negative expression Cytokeratin 5/6- strong diffuse expression. Overall the morphology and immunophenotype are that of invasive squamous cell carcinoma The history of primary esophageal squamous cell carcinoma is noted ([id number]). The case was reviewed by Dr. Saralyn Pilar who concurs. The case was discussed with Drs. Raynaldo Opitz on 12/29/13 (CRR:caf 12/28/13) Mali RUND DO Pathologist, Electronic Signature   Assessment / Plan:   Status post surgical resection of  ypT3,ypN0 squamous cell carcinoma of the esophagus with perineural invasion identified Stage IIb Esophageal swallowing test no evidence of obstruction Now with enlarging right lower lobe lung nodule and right hilar adenopathy. Rt lung mass positive for squamous cell cancer I have reviewed the path  with the patient through the patient daughter who is fluent in [place]. Patient seen in [place] with Dr Benay Spice, He will order PET scan and depending on findings will determine best treatment for presumed metastatic esophageal cancer, but primary lung cancer with hilar nodes   After PET completed will discuss further RX with Oncology   Bruce [name]

## 2014-01-03 ENCOUNTER — Telehealth: Payer: Self-pay | Admitting: Oncology

## 2014-01-03 NOTE — Telephone Encounter (Signed)
pet scheduled for 2/24. checked w/central and no availability before 2/24. message to BS to see if f/u d/t should be changed.

## 2014-01-04 ENCOUNTER — Encounter: Payer: Self-pay | Admitting: *Deleted

## 2014-01-04 NOTE — Progress Notes (Signed)
Called Radiology dept to see if PET could be scheduled sooner.  They will call me back and let me know.

## 2014-01-10 ENCOUNTER — Other Ambulatory Visit: Payer: Self-pay | Admitting: *Deleted

## 2014-01-10 ENCOUNTER — Telehealth: Payer: Self-pay | Admitting: Oncology

## 2014-01-10 NOTE — Telephone Encounter (Signed)
per 2/23 pof pt scheduled for f/u w/BS 2/26. called via [employer] but was not able to reach pt or lm. pt on schedule for pet @ [facility] tomorrow - added comment to pet scan appt to send pt to [facility] re [phone] appt. mailed shcedule via outside mail.

## 2014-01-11 ENCOUNTER — Encounter (HOSPITAL_COMMUNITY)
Admission: RE | Admit: 2014-01-11 | Discharge: 2014-01-11 | Disposition: A | Discharge status: Home / Self Care | Payer: BC Managed Care – PPO | Source: Ambulatory Visit | Attending: Oncology | Admitting: Oncology

## 2014-01-11 DIAGNOSIS — C155 Malignant neoplasm of lower third of esophagus: Secondary | ICD-10-CM | POA: Insufficient documentation

## 2014-01-11 LAB — GLUCOSE, CAPILLARY: GLUCOSE-CAPILLARY: 92 mg/dL (ref 70–99)

## 2014-01-11 MED ORDER — FLUDEOXYGLUCOSE F - 18 (FDG) INJECTION
953.0000 | Freq: Once | INTRAVENOUS | Status: AC | PRN
Start: 2014-01-11 — End: 2014-01-11

## 2014-01-11 MED ORDER — FLUDEOXYGLUCOSE F - 18 (FDG) INJECTION
6.2000 | Freq: Once | INTRAVENOUS | Status: AC | PRN
  Administered 2014-01-11: 6.2 via INTRAVENOUS

## 2014-01-13 ENCOUNTER — Telehealth: Payer: Self-pay | Admitting: Oncology

## 2014-01-13 ENCOUNTER — Ambulatory Visit (HOSPITAL_BASED_OUTPATIENT_CLINIC_OR_DEPARTMENT_OTHER): Payer: BC Managed Care – PPO | Admitting: Oncology

## 2014-01-13 VITALS — BP 113/77 | HR 93 | Temp 97.7°F | Resp 18 | Ht 63.0 in | Wt 131.0 lb

## 2014-01-13 DIAGNOSIS — C155 Malignant neoplasm of lower third of esophagus: Secondary | ICD-10-CM

## 2014-01-13 DIAGNOSIS — C7800 Secondary malignant neoplasm of unspecified lung: Secondary | ICD-10-CM

## 2014-01-13 NOTE — Telephone Encounter (Signed)
gave pt appt for lab and MD, Bruce [name] got pt an appt with Radiation and gave date to pt

## 2014-01-13 NOTE — Progress Notes (Signed)
Bruce [name]    OFFICE PROGRESS NOTE   INTERVAL HISTORY:   Bruce [name] presents today with his daughter and interpreter. No dysphagia. He reports discomfort at the right upper posterior chest biopsy site when supine. No dyspnea.  He saw Dr. Servando Snare after the biopsy of a right upper lung nodule confirmed squamous cell carcinoma. He underwent a staging PET scan earlier this week.  Objective:  Vital signs in last 24 hours:  Blood pressure 113/77, pulse 93, temperature 97.7 F (36.5 C), temperature source Oral, resp. rate 18, height 5\' 3"  (1.6 m), weight 131 lb (59.421 kg), SpO2 100.00%.    HEENT: Neck without mass Lymphatics: No cervical, supraclavicular, or axillary nodes Resp: End inspiratory fine rales at the right greater than left lower posterior chest. No respiratory distress Cardio: Regular rate and rhythm GI: No hepatomegaly Vascular: No leg edema   Lab Results:  Lab Results  Component Value Date   WBC 5.8 12/27/2013   HGB 14.7 12/27/2013   HCT 41.4 12/27/2013   MCV 89.6 12/27/2013   PLT 184 12/27/2013   NEUTROABS 1.2* 07/29/2012      Medications: I have reviewed the patient's current medications.  Assessment/Plan: 1. 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 Staging CT scans of the chest, abdomen and pelvis on 06/02/2012 negative for evidence of metastatic disease. Endoscopic ultrasound 06/11/2012 confirmed a uT3 uN1 tumor at 40 cm from the incisors. He began radiation 06/15/2012. He began weekly Taxol/carboplatin chemotherapy 06/16/2012. He completed 6 weekly chemotherapy treatments. The carboplatin was held with week 5 due to thrombocytopenia. He completed the sixth weekly treatment with Taxol and carboplatin on 07/23/2012. He completed radiation on 07/29/2012 -he underwent an esophagectomy on 08/26/2012 with the pathology confirming a ypT3,ypN0 squamous cell carcinoma with  perineural invasion identified.  2. History of solid/liquid dysphagia secondary to the obstructing esophagus mass. He reports recurrent solid dysphagia 3. Reflux. He is taking Protonix 4. Chest CT 06/15/2013 with postoperative changes from esophagectomy and gastric pull-through. 7 mm posterior subpleural nodule in the right lower lobe. Followup CT scan 12/16/2013 revealed an increase in the size of a right hilar node and right upper lobe nodule.  CT biopsy of the right upper lobe nodule on 12/27/2013 confirmed invasive squamous cell carcinoma, TTF-1 negative Staging PET scan 01/11/2014 confirmed a hypermetabolic cavitary right upper lobe mass, hypermetabolic right hilar node    Disposition:  Bruce [name] has a history of squamous cell carcinoma the esophagus. He was treated with chemotherapy/radiation and underwent an esophagectomy in October of 2013.  He now has a biopsy-proven squamous cell carcinoma involving a right lung nodule. There is hypermetabolic right hilar adenopathy on a staging PET scan. No other sites of metastatic disease. I reviewed the PET images with him.  I discussed the differential diagnosis with the patient and his family. 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 I will present his case at the GI tumor conference 01/19/2014. He will be referred to Dr. Lisbeth Renshaw to consider the potential for additional chest radiation. He may be a candidate for definitive radiation to the right lung mass with concurrent chemotherapy. If he is not a candidate for radiation we will consider surgical and systemic therapy options.  He will return for an office visit here 01/31/2014. We will see him sooner pending the GI tumor conference discussion and radiation oncology consultation.  Betsy Coder, MD  01/13/2014  12:32 PM

## 2014-01-17 ENCOUNTER — Ambulatory Visit
Admission: RE | Admit: 2014-01-17 | Discharge: 2014-01-17 | Disposition: A | Discharge status: Home / Self Care | Payer: BC Managed Care – PPO | Source: Ambulatory Visit | Attending: Radiation Oncology | Admitting: Radiation Oncology

## 2014-01-17 ENCOUNTER — Encounter: Payer: Self-pay | Admitting: Radiation Oncology

## 2014-01-17 VITALS — BP 104/65 | HR 89 | Temp 97.9°F | Resp 20 | Ht 63.0 in | Wt 132.2 lb

## 2014-01-17 DIAGNOSIS — R918 Other nonspecific abnormal finding of lung field: Secondary | ICD-10-CM

## 2014-01-17 DIAGNOSIS — C159 Malignant neoplasm of esophagus, unspecified: Secondary | ICD-10-CM | POA: Insufficient documentation

## 2014-01-17 DIAGNOSIS — C3410 Malignant neoplasm of upper lobe, unspecified bronchus or lung: Secondary | ICD-10-CM

## 2014-01-17 DIAGNOSIS — R599 Enlarged lymph nodes, unspecified: Secondary | ICD-10-CM | POA: Insufficient documentation

## 2014-01-17 HISTORY — DX: Malignant neoplasm of unspecified part of unspecified bronchus or lung: C34.90

## 2014-01-17 NOTE — Progress Notes (Signed)
Thoracic Location of Tumor / Histology:Right upper lobe Lung, hx Esophageal cancer  Patient presented  months ago with symptoms of: recurrent solid dysphagia  Biopsies of (if applicable) revealed : 02/17/31 :D iagnosis :Lung, biopsy, Centrally cavitary right upper lobe- INVASIVE SQUAMOUS CELL CARCINOMA.- SEE COMMENT.Microscopic Comment Carcinoma indicates the following immunophenotypeTTF-1- negative expression Cytokeratin 5/6- strong diffuse expression.  Tobacco/Marijuana/Snuff/ETOH use: Former smoker and  Chewing tobacco , drinks Alcohol  Past/Anticipated interventions by cardiothoracic surgery, if any:   Past/Anticipated interventions by medical oncology, if any: 01/13/14 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 Staging CT scans of the chest, abdomen and pelvis on 06/02/2012 negative for evidence of metastatic disease. Endoscopic ultrasound 06/11/2012 confirmed a uT3 uN1 tumor at 40 cm from the incisors. He began radiation 06/15/2012. He began weekly Taxol/carboplatin chemotherapy 06/16/2012. He completed 6 weekly chemotherapy treatments. The carboplatin was held with week 5 due to thrombocytopenia. He completed the sixth weekly treatment with Taxol and carboplatin on 07/23/2012. He completed radiation on 07/29/2012 -he underwent an esophagectomy on 08/26/2012 with the pathology confirming a ypT3,ypN0 squamous cell carcinoma with perineural invasion identified. History of solid/liquid dysphagia secondary to the obstructing esophagus mass. He reports recurrent solid dysphagia, Follow up appt 01/31/14   Signs/Symptoms  Weight changes, if any: none   Respiratory complaints, if any: gets sob when eating a lot stated  Hemoptysis, if any: No  Pain issues, if any:  When changing position at night, upper back to front, , at times when eating too much gets nauseated, and some food gets stuck but can still swallow, no  coughing stated  Only takes Protonix mediation  SAFETY ISSUES:No  Prior radiation? Yes, : 06/15/12-07/29/12 Esophagus total 54 Gy   Pacemaker/ICD? No   Is the patient on methotrexate? No  Current Complaints / other details:  Interpreter Bruce [name] in with patient and daughter,

## 2014-01-18 DIAGNOSIS — C3410 Malignant neoplasm of upper lobe, unspecified bronchus or lung: Secondary | ICD-10-CM | POA: Insufficient documentation

## 2014-01-18 NOTE — Progress Notes (Signed)
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  The patient is seen today regarding new findings within the lung. He has continued to do relatively well in terms of swallowing. He has difficulty with larger meals or larger bites but no change in this.  The patient has had a right lung nodule which was noted on prior imaging. He has been asymptomatic for this issue. The patient underwent repeat CT imaging that showed this nodule had increased in size and a right hilar lymph node was also noted. This imaging has included a PET scan which showed a hypermetabolic cavitary right upper lobe mass corresponding to the recent findings. He also had pathologic/hypermetabolic right infrahilar adenopathy. The patient underwent a CT-guided biopsy of the right upper lobe nodule on 12/27/2013. This returned positive for squamous cell carcinoma, invasive.                              ALLERGIES:  has No Known Allergies.  Meds: Current Outpatient Prescriptions  Medication Sig Dispense Refill  . pantoprazole (PROTONIX) 40 MG tablet Take 1 tablet (40 mg total) by mouth daily.  30 tablet  11   No current facility-administered medications for this encounter.    Physical Findings: The patient is in no acute distress. Patient is alert and oriented.  vitals were not taken for this visit..   General: Well-developed, in no acute distress HEENT: Normocephalic, atraumatic Cardiovascular: Regular rate and rhythm Respiratory: Clear to auscultation bilaterally GI: Soft, nontender, normal bowel sounds Extremities: No edema present   Lab  Findings: Lab Results  Component Value Date   WBC 5.8 12/27/2013   HGB 14.7 12/27/2013   HCT 41.4 12/27/2013   MCV 89.6 12/27/2013   PLT 184 12/27/2013     Radiographic Findings: Dg Chest 1 View  12/27/2013   CLINICAL DATA:  Post right upper lobe nodule biopsy, no chest pain or shortness of breath.  EXAM: CHEST - 1 VIEW  COMPARISON:  CT BIOPSY dated 12/27/2013; CT CHEST W/CM dated 12/16/2013; DG CHEST 2 VIEW dated 03/04/2013  FINDINGS: Grossly unchanged borderline enlarged cardiac silhouette and mediastinal contours. Known centrally cavitary nodule within the right upper lobe is not well demonstrated on the present examination. No pleural effusion or pneumothorax following right upper lobe lung nodule biopsy. No evidence of edema. Unchanged bones.  IMPRESSION: No evidence of complication following right upper lung nodule biopsy.   Electronically Signed   By: Sandi Mariscal M.D.   On: 12/27/2013 12:01   Dg Esophagus  12/22/2013   CLINICAL DATA:  Esophageal cancer  EXAM: ESOPHOGRAM/BARIUM SWALLOW  TECHNIQUE: Single contrast examination was performed using  thin barium.  COMPARISON:  09/02/2012  FLUOROSCOPY TIME:  54 seconds  FINDINGS: Normal oral phase of swallowing.  No laryngeal penetration or tracheobronchial aspiration.  Status post esophagectomy with gastric pull-through.  Layering fluid/debris in the distal gastric pull-through.  IMPRESSION: Satisfactory appearance status post esophagectomy with gastric pull through.   Electronically Signed   By: Julian Hy M.D.   On: 12/22/2013 11:53   Nm Pet Image  Restag (ps) Skull Base To Thigh  01/11/2014   CLINICAL DATA:  Subsequent treatment strategy for esophageal cancer. Recent biopsy of a cavitary right upper lobe lung lesion revealed invasive squamous cell carcinoma.  EXAM: NUCLEAR MEDICINE PET SKULL BASE TO THIGH  FASTING BLOOD GLUCOSE:  Value: 92 mg/dl  TECHNIQUE: 6.3 mCi F-18 FDG was injected intravenously. Full-ring PET imaging was performed from the skull  base to thigh after the radiotracer. CT data was obtained and used for attenuation correction and anatomic localization.  COMPARISON:  CT BIOPSY dated 12/27/2013  FINDINGS: NECK  No hypermetabolic lymph nodes in the neck.  CHEST  Gastric pull-through noted. Cavitary posterior right upper lobe mass, 2.1 x 1.5 cm, maximum standard uptake value 7.7. Posterior left infrahilar lymph node, 2.3 x 2.0 cm, maximum standard uptake value 9.8. Adjacent indistinct infrahilar node has maximum standard uptake value of 5.9.  A vascular structure or small lymph node posterior to the descending thoracic aorta has a maximum standard uptake value of 3.4 and is probably incidental.  ABDOMEN/PELVIS  Mid transverse colon has some faintly increased signal but is also small in caliber, unless this increased signal (standard uptake value 6.1) is probably physiologic. Mildly prominent but gas-filled appendix does not appear actively inflamed. It is  SKELETON  No focal hypermetabolic activity to suggest skeletal metastasis.  IMPRESSION: 1. Hypermetabolic cavitary right upper lobe mass corresponding to the known invasive squamous cell carcinoma. Pathologic right infrahilar adenopathy. 2. Faintly increased activity in the mid transverse colon is probably physiologic.   Electronically Signed   By: Sherryl Barters M.D.   On: 01/11/2014 11:46   Ct Biopsy  12/27/2013   INDICATION: History of esophageal cancer, now with enlarging centrally necrotic nodule within the right upper lobe. Please obtain biopsy to evaluate for either metastatic disease versus primary lung cancer.  EXAM: CT BIOPSY  MEDICATIONS: Fentanyl 75 mcg IV; Versed 1.5 mg IV  ANESTHESIA/SEDATION: Sedation time  20 minutes  CONTRAST:  None  COMPARISON:  NM PET IMAGE INITIAL (PI) SKULL BASE TO THIGH dated 06/11/2012; CT CHEST W/CM dated 12/16/2013; CT CHEST W/CM dated 06/15/2013  PROCEDURE: Informed consent was obtained from the patient (via the use of a medical translator) following an  explanation of the procedure, risks, benefits and alternatives. The patient understands,agrees and consents for the procedure. All questions were addressed. A time out was performed prior to the initiation of the procedure.  The patient was positioned right lateral decubitus on the CT table and a limited chest CT was performed for procedural planning demonstrating no change to minimal increase in size of the now approximately 1.7 x 1.4 cm nodule within the right upper lobe (image 5, series 3). Note, there has been interval increased central cavitation of this nodule. The operative site was prepped and draped in the usual sterile fashion. Under sterile conditions and local anesthesia, a 22 gauge spinal needle was utilized for procedural planning. This was followed by the placement of a 17 gauge coaxial needle into the peripheral caudal aspect of the nodule. Positioning was confirmed with intermittent CT fluoroscopy and followed by the acquisition of 2 core needle biopsies with an 18 gauge core needle biopsy device.  Limited post procedural chest CT was negative for pneumothorax or additional complication. The co-axial needle was removed and hemostasis was achieved with manual compression. A dressing was placed. The patient tolerated the procedure well without immediate postprocedural complication. The patient was escorted to have an upright chest radiograph.  COMPLICATIONS: None immediate.  IMPRESSION: Technically successful CT guided core needle core biopsy of enlarging centrally necrotic nodule within the right upper lobe.   Electronically Signed   By: Sandi Mariscal M.D.   On: 12/27/2013 13:19    Impression:    The patient has a right for lung nodule and right infrahilar lymphadenopathy which returned positive for invasive squamous cell carcinoma with regards to the right lung nodule. The patient's case is going to be discussed at GI conference on Wednesday. We will discuss at that time the likelihood of  metastatic disease versus lung primary and also the treatment options available. I have had a chance to review the patient's current imaging personally as well as his prior treatment. I believe that additional radiation treatment to a definitive dose would be feasible given his prior treatment.  Plan:  We will contact the patient after conference on Wednesday to make further arrangements with regards to treatment.  I spent 30  minutes with the patient today, the majority of which was spent counseling the patient on the diagnosis of cancer and coordinating care.   Jodelle Gross, M.D., Ph.D.

## 2014-01-24 ENCOUNTER — Other Ambulatory Visit: Payer: Self-pay | Admitting: *Deleted

## 2014-01-24 ENCOUNTER — Encounter: Payer: Self-pay | Admitting: Cardiothoracic Surgery

## 2014-01-24 ENCOUNTER — Ambulatory Visit (INDEPENDENT_AMBULATORY_CARE_PROVIDER_SITE_OTHER): Payer: BC Managed Care – PPO | Admitting: Cardiothoracic Surgery

## 2014-01-24 VITALS — BP 107/71 | HR 97 | Resp 18 | Ht 63.0 in | Wt 131.0 lb

## 2014-01-24 DIAGNOSIS — Z9049 Acquired absence of other specified parts of digestive tract: Secondary | ICD-10-CM

## 2014-01-24 DIAGNOSIS — C159 Malignant neoplasm of esophagus, unspecified: Secondary | ICD-10-CM

## 2014-01-24 DIAGNOSIS — C3410 Malignant neoplasm of upper lobe, unspecified bronchus or lung: Secondary | ICD-10-CM

## 2014-01-24 DIAGNOSIS — IMO0002 Reserved for concepts with insufficient information to code with codable children: Secondary | ICD-10-CM

## 2014-01-24 DIAGNOSIS — Z9889 Other specified postprocedural states: Secondary | ICD-10-CM

## 2014-01-24 NOTE — Progress Notes (Signed)
[address]       [place] [address]             [id number]                    Antwaine Sterbenz [facility] Medical Record #[id number] Date of Birth: 11/14/1961  Referring: No ref. provider found Primary Care: Pcp Not In System  Chief Complaint:    Chief Complaint  Patient presents with  . Routine Post Op    s/p esophagectomy 08/26/12    History of Present Illness:    Bruce [name] 53 y.o. male  underwent total esophagectomy with cervical esophagogastrostomy October 2013 after a course of preoperative radiation and chemotherapy for squamous cell carcinoma of the esophagus. On followup scans the patient was noted to have an increasing right lung lesion. Needle biopsy several weeks ago demonstrated squamous cell carcinoma, from a pathologic standpoint it's unclear if this is a new second primary starting in the lung or a metastatic deposit. There possibly is N1 nodes involved on the right by PET scan. There is no other activity on PET scan to suggest metastatic disease.  The patient is an office to consider surgical resection of the right lung lesion and node dissection. The patient comes with his daughter to the office today who speaks fluent [place]. The patient has been a smoker in the past but quit smoking in 2008.    Current Activity/ Functional Status:  Patient is independent with mobility/ambulation, transfers, ADL's, IADL's.   Zubrod Score: At the time of surgery this patient's most appropriate activity status/level should be described as: []     0    Normal activity, no symptoms [x]     1    Restricted in physical strenuous activity but ambulatory, able to do out light work []     2    Ambulatory and capable of self care, unable to do work activities, up and about               >50 % of waking hours                              []     3    Only limited self care, in bed greater than 50% of waking hours []     4    Completely disabled, no self care, confined  to bed or chair []     5    Moribund   Past Medical History  Diagnosis Date  . Esophagitis   . Mass of esophagus 05/28/2012    BX'D DISTAL ESOPHAGUS,PENDING  . Dysphasia     solid and liquid  . Neuropathy     right hand numbness 1 year 2012  . 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 Lung cancer 12/27/13    right upper lung    Past Surgical History  Procedure Laterality Date  . 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 Eus  06/11/2012    Procedure: UPPER ENDOSCOPIC ULTRASOUND (EUS) RADIAL;  Surgeon: Milus Banister, MD;  Location: [facility];  Service: Endoscopy;  Laterality: N/A;  . Complete esophagectomy  08/26/2012    Procedure: ESOPHAGECTOMY COMPLETE;  Surgeon: [name], MD;  Location: [facility];  Service: Thoracic;  Laterality: N/A;  transhiatal   . Pyloroplasty  08/26/2012  Procedure: PYLOROPLASTY;  Surgeon: [name], MD;  Location: [facility] [place];  Service: Thoracic;  Laterality: N/A;  . Video bronchoscopy  08/26/2012    Procedure: VIDEO BRONCHOSCOPY;  Surgeon: [name], MD;  Location: [place];  Service: Thoracic;  Laterality: N/A;  . Jejunostomy  08/26/2012    Procedure: JEJUNOSTOMY;  Surgeon: [name], MD;  [facility];  Service: Thoracic;  Laterality: N/A;  placement of feeding jujunostomy tube    Family History  Problem Relation Age of Onset  . Breast cancer Sister     History   Social History  . Marital Status: Married    Spouse Name: N/A    Number of Children: N/A  . Years of Education: N/A   Occupational History  . welder    Social History Main Topics  . Smoking status: Former Smoker -- 0.40 packs/day    Types: Cigarettes    Quit date: 06/19/2011  . Smokeless tobacco: Former Systems developer    Quit date: 06/03/2007  . Alcohol Use: 8.4 oz/week    14 Cans of beer per week  . Drug Use: No  . Sexual Activity: Not on file   Other Topics  Concern  . Not on file   Social History Narrative  . No narrative on file    History  Smoking status  . Former Smoker -- 0.40 packs/day  . Types: Cigarettes  . Quit date: 06/19/2011  Smokeless tobacco  . Former Systems developer  . Quit date: 06/03/2007    History  Alcohol Use  . 8.4 oz/week  . 14 Cans of beer per week     No Known Allergies  Current Outpatient Prescriptions  Medication Sig Dispense Refill  . pantoprazole (PROTONIX) 40 MG tablet Take 1 tablet (40 mg total) by mouth daily.  30 tablet  11   No current facility-administered medications for this visit.     Review of Systems:     Cardiac Review of Systems: Y or N  Chest Pain [ n   ]  Resting SOB [ n  ] Exertional SOB  [n  ]  Orthopnea [ n ]   Pedal Edema [ n  ]    Palpitations [ n ] Syncope  [ n ]   Presyncope [  n ]  General Review of Systems: [Y] = yes [  ]=no Constitional: recent weight change [n  ];  Wt loss over the last 3 months [   ] anorexia [  ]; fatigue Blue.Reese  ]; nausea [ n ]; night sweats [ n ]; fever [ n ]; or chills [n  ];          Dental: poor dentition[  ]; Last Dentist visit:   Eye : blurred vision [  ]; diplopia [   ]; vision changes [  ];  Amaurosis fugax[  ]; Resp: cough [  ];  wheezing[n  ];  hemoptysis[ n ]; shortness of breath[ n ]; paroxysmal nocturnal dyspnea[ n ]; dyspnea on exertion[ n ]; or orthopnea[  ];  GI:  gallstones[  ], vomiting[  ];  dysphagia[  ]; melena[  ];  hematochezia [  ]; heartburn[  ];   Hx of  Colonoscopy[  ]; GU: kidney stones [  ]; hematuria[  ];   dysuria [n  ];  nocturia[  ];  history of     obstruction [ n ]; urinary frequency [ n ]  Skin: rash, swelling[  ];, hair loss[  ];  peripheral edema[  ];  or itching[  ]; Musculosketetal: myalgias[  ];  joint swelling[  ];  joint erythema[  ];  joint pain[  ];  back pain[  ];  Heme/Lymph: bruising[  ];  bleeding[n  ];  anemia[  ];  Neuro: TIA[  ];  headaches[  ];  stroke[ n ];  vertigo[  ];  seizures[  ];   paresthesias[   ];  difficulty walking[n  ];  Psych:depression[  ]; anxiety[  ];  Endocrine: diabetes[  ];  thyroid dysfunction[  ];  Immunizations: Flu up to date Blue.Reese  ]; Pneumococcal up to date [  y];  Other:  Physical Exam: BP 107/71  Pulse 97  Resp 18  Ht 5\' 3"  (1.6 m)  Wt 131 lb (59.421 kg)  BMI 23.21 kg/m2  SpO2 98%  PHYSICAL EXAMINATION:  General appearance: alert, cooperative, appears stated age and no distress Neurologic: intact Heart: regular rate and rhythm, S1, S2 normal, no murmur, click, rub or gallop Lungs: clear to auscultation bilaterally Abdomen: soft, non-tender; bowel sounds normal; no masses,  no organomegaly Extremities: extremities normal, atraumatic, no cyanosis or edema and Homans sign is negative, no sign of DVT Wound: Patient's incisions in the left neck and mid abdomen are well-healed, he has bilateral chest tube sites well healed from chest tubes placed at the time of his esophagectomy Patient has no cervical or supraclavicular or axillary adenopathy.  Diagnostic Studies & Laboratory data:     Recent Radiology Findings:   Dg Chest 1 View  12/27/2013   CLINICAL DATA:  Post right upper lobe nodule biopsy, no chest pain or shortness of breath.  EXAM: CHEST - 1 VIEW  COMPARISON:  CT BIOPSY dated 12/27/2013; CT CHEST W/CM dated 12/16/2013; DG CHEST 2 VIEW dated 03/04/2013  FINDINGS: Grossly unchanged borderline enlarged cardiac silhouette and mediastinal contours. Known centrally cavitary nodule within the right upper lobe is not well demonstrated on the present examination. No pleural effusion or pneumothorax following right upper lobe lung nodule biopsy. No evidence of edema. Unchanged bones.  IMPRESSION: No evidence of complication following right upper lung nodule biopsy.   Electronically Signed   By: Sandi Mariscal M.D.   On: 12/27/2013 12:01   Nm Pet Image Restag (ps) Skull Base To Thigh  01/11/2014   CLINICAL DATA:  Subsequent treatment strategy for esophageal cancer. Recent  biopsy of a cavitary right upper lobe lung lesion revealed invasive squamous cell carcinoma.  EXAM: NUCLEAR MEDICINE PET SKULL BASE TO THIGH  FASTING BLOOD GLUCOSE:  Value: 92 mg/dl  TECHNIQUE: 6.3 mCi F-18 FDG was injected intravenously. Full-ring PET imaging was performed from the skull base to thigh after the radiotracer. CT data was obtained and used for attenuation correction and anatomic localization.  COMPARISON:  CT BIOPSY dated 12/27/2013  FINDINGS: NECK  No hypermetabolic lymph nodes in the neck.  CHEST  Gastric pull-through noted. Cavitary posterior right upper lobe mass, 2.1 x 1.5 cm, maximum standard uptake value 7.7. Posterior left infrahilar lymph node, 2.3 x 2.0 cm, maximum standard uptake value 9.8. Adjacent indistinct infrahilar node has maximum standard uptake value of 5.9.  A vascular structure or small lymph node posterior to the descending thoracic aorta has a maximum standard uptake value of 3.4 and is probably incidental.  ABDOMEN/PELVIS  Mid transverse colon has some faintly increased signal but is also small in caliber, unless this increased signal (standard uptake value 6.1) is  probably physiologic. Mildly prominent but gas-filled appendix does not appear actively inflamed. It is  SKELETON  No focal hypermetabolic activity to suggest skeletal metastasis.  IMPRESSION: 1. Hypermetabolic cavitary right upper lobe mass corresponding to the known invasive squamous cell carcinoma. Pathologic right infrahilar adenopathy. 2. Faintly increased activity in the mid transverse colon is probably physiologic.   Electronically Signed   By: Sherryl Barters M.D.   On: 01/11/2014 11:46   Ct Biopsy  12/27/2013   INDICATION: History of esophageal cancer, now with enlarging centrally necrotic nodule within the right upper lobe. Please obtain biopsy to evaluate for either metastatic disease versus primary lung cancer.  EXAM: CT BIOPSY  MEDICATIONS: Fentanyl 75 mcg IV; Versed 1.5 mg IV  ANESTHESIA/SEDATION:  Sedation time  20 minutes  CONTRAST:  None  COMPARISON:  NM PET IMAGE INITIAL (PI) SKULL BASE TO THIGH dated 06/11/2012; CT CHEST W/CM dated 12/16/2013; CT CHEST W/CM dated 06/15/2013  PROCEDURE: Informed consent was obtained from the patient (via the use of a medical translator) following an explanation of the procedure, risks, benefits and alternatives. The patient understands,agrees and consents for the procedure. All questions were addressed. A time out was performed prior to the initiation of the procedure.  The patient was positioned right lateral decubitus on the CT table and a limited chest CT was performed for procedural planning demonstrating no change to minimal increase in size of the now approximately 1.7 x 1.4 cm nodule within the right upper lobe (image 5, series 3). Note, there has been interval increased central cavitation of this nodule. The operative site was prepped and draped in the usual sterile fashion. Under sterile conditions and local anesthesia, a 22 gauge spinal needle was utilized for procedural planning. This was followed by the placement of a 17 gauge coaxial needle into the peripheral caudal aspect of the nodule. Positioning was confirmed with intermittent CT fluoroscopy and followed by the acquisition of 2 core needle biopsies with an 18 gauge core needle biopsy device.  Limited post procedural chest CT was negative for pneumothorax or additional complication. The co-axial needle was removed and hemostasis was achieved with manual compression. A dressing was placed. The patient tolerated the procedure well without immediate postprocedural complication. The patient was escorted to have an upright chest radiograph.  COMPLICATIONS: None immediate.  IMPRESSION: Technically successful CT guided core needle core biopsy of enlarging centrally necrotic nodule within the right upper lobe.   Electronically Signed   By: Sandi Mariscal M.D.   On: 12/27/2013 13:19      Recent Lab Findings: Lab  Results  Component Value Date   WBC 5.8 12/27/2013   HGB 14.7 12/27/2013   HCT 41.4 12/27/2013   PLT 184 12/27/2013   GLUCOSE 107* 09/01/2012   ALT 56* 08/24/2012   AST 52* 08/24/2012   NA 136 09/01/2012   K 4.0 09/01/2012   CL 100 09/01/2012   CREATININE 0.74 09/01/2012   BUN 23 09/01/2012   CO2 29 09/01/2012   INR 0.94 12/27/2013      Assessment / Plan:   I discussed with the patient the pros and cons of proceeding with surgical resection, for what could be a primary lung cancer clinical stage IIA versus metastatic squamous cell of the esophagus. Since the patient has only a single pulmonary nodule we will proceed as if this is a primary lung cancer, knowing that it could easily be a metastatic deposit. I discussed with the patient proceeding with bronchoscopy with video-assisted thoracoscopy and  lung resection probable wedge resection and lymph node dissection. The patient is willing to proceed we'll tentatively arrange for Monday, March 16.     I spent 55 minutes counseling the patient face to face. The total time spent in the appointment was 60 minutes.  [name] MD      [address] ,[place] [address] Office [phone]   Beeper [phone]  01/24/2014 4:41 PM

## 2014-01-28 ENCOUNTER — Ambulatory Visit (HOSPITAL_COMMUNITY)
Admission: RE | Admit: 2014-01-28 | Discharge: 2014-01-28 | Disposition: A | Discharge status: Home / Self Care | Payer: BC Managed Care – PPO | Source: Ambulatory Visit | Attending: Cardiothoracic Surgery | Admitting: Cardiothoracic Surgery

## 2014-01-28 ENCOUNTER — Encounter (HOSPITAL_COMMUNITY)
Admission: RE | Admit: 2014-01-28 | Discharge: 2014-01-28 | Disposition: A | Discharge status: Home / Self Care | Payer: BC Managed Care – PPO | Source: Ambulatory Visit | Attending: Cardiothoracic Surgery | Admitting: Cardiothoracic Surgery

## 2014-01-28 ENCOUNTER — Encounter (HOSPITAL_COMMUNITY): Payer: Self-pay

## 2014-01-28 VITALS — BP 144/83 | HR 62 | Temp 97.9°F | Resp 18 | Ht 63.0 in | Wt 126.4 lb

## 2014-01-28 DIAGNOSIS — IMO0002 Reserved for concepts with insufficient information to code with codable children: Secondary | ICD-10-CM

## 2014-01-28 DIAGNOSIS — Z01811 Encounter for preprocedural respiratory examination: Secondary | ICD-10-CM | POA: Insufficient documentation

## 2014-01-28 DIAGNOSIS — Z01818 Encounter for other preprocedural examination: Secondary | ICD-10-CM | POA: Insufficient documentation

## 2014-01-28 DIAGNOSIS — Z01812 Encounter for preprocedural laboratory examination: Secondary | ICD-10-CM | POA: Insufficient documentation

## 2014-01-28 DIAGNOSIS — C3490 Malignant neoplasm of unspecified part of unspecified bronchus or lung: Secondary | ICD-10-CM | POA: Insufficient documentation

## 2014-01-28 DIAGNOSIS — Z01810 Encounter for preprocedural cardiovascular examination: Secondary | ICD-10-CM | POA: Insufficient documentation

## 2014-01-28 LAB — PULMONARY FUNCTION TEST
DL/VA % pred: 95 %
DL/VA: 3.94 ml/min/mmHg/L
DLCO unc % pred: 60 %
DLCO unc: 13.78 ml/min/mmHg
FEF 25-75 Post: 1.68 L/sec
FEF 25-75 Pre: 0.51 L/sec
FEF2575-%Change-Post: 230 %
FEF2575-%Pred-Post: 60 %
FEF2575-%Pred-Pre: 18 %
FEV1-%Change-Post: 51 %
FEV1-%Pred-Post: 66 %
FEV1-%Pred-Pre: 43 %
FEV1-Post: 2.02 L
FEV1-Pre: 1.33 L
FEV1FVC-%Change-Post: 51 %
FEV1FVC-%Pred-Pre: 62 %
FEV6-%Change-Post: 0 %
FEV6-%Pred-Post: 72 %
FEV6-%Pred-Pre: 72 %
FEV6-Post: 2.7 L
FEV6-Pre: 2.72 L
FEV6FVC-%Change-Post: 2 %
FEV6FVC-%Pred-Post: 104 %
FEV6FVC-%Pred-Pre: 102 %
FVC-%Change-Post: 0 %
FVC-%Pred-Post: 71 %
FVC-%Pred-Pre: 71 %
FVC-Post: 2.79 L
FVC-Pre: 2.77 L
Post FEV1/FVC ratio: 72 %
Post FEV6/FVC ratio: 100 %
Pre FEV1/FVC ratio: 48 %
Pre FEV6/FVC Ratio: 98 %
RV % pred: 84 %
RV: 1.41 L
TLC % pred: 75 %
TLC: 4.21 L

## 2014-01-28 LAB — COMPREHENSIVE METABOLIC PANEL
ALT: 26 U/L (ref 0–53)
AST: 30 U/L (ref 0–37)
Albumin: 4.1 g/dL (ref 3.5–5.2)
Alkaline Phosphatase: 114 U/L (ref 39–117)
BUN: 9 mg/dL (ref 6–23)
CO2: 18 mEq/L — ABNORMAL LOW (ref 19–32)
Calcium: 9.2 mg/dL (ref 8.4–10.5)
Chloride: 105 mEq/L (ref 96–112)
Creatinine, Ser: 0.77 mg/dL (ref 0.50–1.35)
GFR calc Af Amer: >90 mL/min (ref >90)
GFR calc non Af Amer: >90 mL/min (ref >90)
Glucose, Bld: 91 mg/dL (ref 70–99)
Potassium: 4.4 mEq/L (ref 3.7–5.3)
Sodium: 138 mEq/L (ref 137–147)
Total Bilirubin: 0.4 mg/dL (ref 0.3–1.2)
Total Protein: 7.3 g/dL (ref 6.0–8.3)

## 2014-01-28 LAB — URINALYSIS, ROUTINE W REFLEX MICROSCOPIC
Bilirubin Urine: NEGATIVE
Glucose, UA: NEGATIVE mg/dL
Hgb urine dipstick: NEGATIVE
Ketones, ur: NEGATIVE mg/dL
Leukocytes, UA: NEGATIVE
Nitrite: NEGATIVE
Protein, ur: NEGATIVE mg/dL
Specific Gravity, Urine: 1.007 (ref 1.005–1.030)
Urobilinogen, UA: 0.2 mg/dL (ref 0.0–1.0)
pH: 7 (ref 5.0–8.0)

## 2014-01-28 LAB — BLOOD GAS, ARTERIAL
Acid-base deficit: 1.9 mmol/L (ref 0.0–2.0)
Bicarbonate: 21.3 mEq/L (ref 20.0–24.0)
Drawn by: 344381
FIO2: 0.21 %
O2 Saturation: 98.7 %
Patient temperature: 98.6
TCO2: 22.3 mmol/L (ref 0–100)
pCO2 arterial: 30.3 mmHg — ABNORMAL LOW (ref 35.0–45.0)
pH, Arterial: 7.461 — ABNORMAL HIGH (ref 7.350–7.450)
pO2, Arterial: 144 mmHg — ABNORMAL HIGH (ref 80.0–100.0)

## 2014-01-28 LAB — CBC
HCT: 43.8 % (ref 39.0–52.0)
Hemoglobin: 15.4 g/dL (ref 13.0–17.0)
MCH: 30.8 pg (ref 26.0–34.0)
MCHC: 35.2 g/dL (ref 30.0–36.0)
MCV: 87.6 fL (ref 78.0–100.0)
Platelets: 182 10*3/uL (ref 150–400)
RBC: 5 MIL/uL (ref 4.22–5.81)
RDW: 12.9 % (ref 11.5–15.5)
WBC: 6.5 10*3/uL (ref 4.0–10.5)

## 2014-01-28 LAB — TYPE AND SCREEN
ABO/RH(D): O POS
Antibody Screen: NEGATIVE

## 2014-01-28 LAB — PROTIME-INR
INR: 0.9 (ref 0.00–1.49)
Prothrombin Time: 12 seconds (ref 11.6–15.2)

## 2014-01-28 LAB — SURGICAL PCR SCREEN
MRSA, PCR: NEGATIVE
Staphylococcus aureus: NEGATIVE

## 2014-01-28 LAB — APTT: aPTT: 33 seconds (ref 24–37)

## 2014-01-28 MED ORDER — ALBUTEROL SULFATE (2.5 MG/3ML) 0.083% IN NEBU
2.5000 mg | INHALATION_SOLUTION | Freq: Once | RESPIRATORY_TRACT | Status: AC
  Administered 2014-01-28: 2.5 mg via RESPIRATORY_TRACT

## 2014-01-28 NOTE — Progress Notes (Signed)
Patient was accompanied to PAT visit with his daughter, Maree Erie and interpreter [place].

## 2014-01-28 NOTE — Pre-Procedure Instructions (Signed)
Bruce [name]  01/28/2014   Your procedure is scheduled on:  Monday, March 16.  Report to [facility], Main Entrance/Entrance "A" at 8:30 AM.  Call this number if you have problems the morning of surgery: [phone]   Remember:   Do not eat food or drink liquids after midnight Sunday.   Take these medicines the morning of surgery with A SIP OF WATER: pantoprazole (PROTONIX).     Do not wear jewelry, make-up or nail polish.  Do not wear lotions, powders, or perfumes.   Men may shave face and neck.  Do not bring valuables to the hospital.  [facility] is not responsible   for any belongings or valuables.               Contacts, dentures or bridgework may not be worn into surgery.  Leave suitcase in the car. After surgery it may be brought to your room.  For patients admitted to the hospital, discharge time is determined by your treatment team.                Special Instructions: [facility], Main Entrance [name] "A"   Please read over the following fact sheets that you were given: Pain Booklet, Coughing and Deep Breathing, Blood Transfusion Information and Surgical Site Infection Prevention

## 2014-01-30 MED ORDER — DEXTROSE 5 % IV SOLN
1.5000 g | INTRAVENOUS | Status: AC
  Administered 2014-01-31: 1.5 g via INTRAVENOUS
  Filled 2014-01-30: qty 1.5

## 2014-01-31 ENCOUNTER — Inpatient Hospital Stay (HOSPITAL_COMMUNITY): Payer: BC Managed Care – PPO | Admitting: Anesthesiology

## 2014-01-31 ENCOUNTER — Inpatient Hospital Stay (HOSPITAL_COMMUNITY)
Admission: RE | Admit: 2014-01-31 | Discharge: 2014-02-04 | DRG: 164 | Disposition: A | Discharge status: Home / Self Care | Payer: BC Managed Care – PPO | Source: Ambulatory Visit | Attending: Cardiothoracic Surgery | Admitting: Cardiothoracic Surgery

## 2014-01-31 ENCOUNTER — Encounter (HOSPITAL_COMMUNITY): Payer: BC Managed Care – PPO | Admitting: Anesthesiology

## 2014-01-31 ENCOUNTER — Other Ambulatory Visit: Payer: BC Managed Care – PPO

## 2014-01-31 ENCOUNTER — Inpatient Hospital Stay (HOSPITAL_COMMUNITY): Payer: BC Managed Care – PPO

## 2014-01-31 ENCOUNTER — Encounter (HOSPITAL_COMMUNITY)
Admission: RE | Disposition: A | Discharge status: Home / Self Care | Payer: Self-pay | Source: Ambulatory Visit | Attending: Cardiothoracic Surgery

## 2014-01-31 ENCOUNTER — Encounter (HOSPITAL_COMMUNITY): Payer: Self-pay | Admitting: *Deleted

## 2014-01-31 ENCOUNTER — Ambulatory Visit: Payer: BC Managed Care – PPO | Admitting: Nurse Practitioner

## 2014-01-31 DIAGNOSIS — C159 Malignant neoplasm of esophagus, unspecified: Secondary | ICD-10-CM | POA: Diagnosis present

## 2014-01-31 DIAGNOSIS — C7800 Secondary malignant neoplasm of unspecified lung: Secondary | ICD-10-CM | POA: Diagnosis present

## 2014-01-31 DIAGNOSIS — Z87891 Personal history of nicotine dependence: Secondary | ICD-10-CM

## 2014-01-31 DIAGNOSIS — J9382 Other air leak: Secondary | ICD-10-CM | POA: Diagnosis not present

## 2014-01-31 DIAGNOSIS — Z9089 Acquired absence of other organs: Secondary | ICD-10-CM

## 2014-01-31 DIAGNOSIS — Z803 Family history of malignant neoplasm of breast: Secondary | ICD-10-CM

## 2014-01-31 DIAGNOSIS — L259 Unspecified contact dermatitis, unspecified cause: Secondary | ICD-10-CM | POA: Diagnosis not present

## 2014-01-31 DIAGNOSIS — IMO0002 Reserved for concepts with insufficient information to code with codable children: Secondary | ICD-10-CM

## 2014-01-31 DIAGNOSIS — Z23 Encounter for immunization: Secondary | ICD-10-CM

## 2014-01-31 DIAGNOSIS — G589 Mononeuropathy, unspecified: Secondary | ICD-10-CM | POA: Diagnosis present

## 2014-01-31 DIAGNOSIS — Z923 Personal history of irradiation: Secondary | ICD-10-CM

## 2014-01-31 DIAGNOSIS — C3410 Malignant neoplasm of upper lobe, unspecified bronchus or lung: Secondary | ICD-10-CM

## 2014-01-31 HISTORY — PX: VIDEO ASSISTED THORACOSCOPY (VATS)/WEDGE RESECTION: SHX6174

## 2014-01-31 HISTORY — PX: VIDEO BRONCHOSCOPY: SHX5072

## 2014-01-31 HISTORY — PX: LYMPH NODE DISSECTION: SHX5087

## 2014-01-31 SURGERY — BRONCHOSCOPY, VIDEO-ASSISTED
Anesthesia: General | Site: Chest | Laterality: Right

## 2014-01-31 MED ORDER — FENTANYL CITRATE 0.05 MG/ML IJ SOLN
INTRAMUSCULAR | Status: DC | PRN
Start: 2014-01-31 — End: 2014-01-31
  Administered 2014-01-31 (×4): 50 ug via INTRAVENOUS
  Administered 2014-01-31: 100 ug via INTRAVENOUS
  Administered 2014-01-31 (×4): 50 ug via INTRAVENOUS

## 2014-01-31 MED ORDER — DEXTROSE 5 % IV SOLN
1.5000 g | Freq: Two times a day (BID) | INTRAVENOUS | Status: AC
  Administered 2014-01-31 – 2014-02-01 (×2): 1.5 g via INTRAVENOUS
  Filled 2014-01-31 (×2): qty 1.5

## 2014-01-31 MED ORDER — TRAMADOL HCL 50 MG PO TABS
50.0000 mg | ORAL_TABLET | Freq: Four times a day (QID) | ORAL | Status: DC | PRN
  Administered 2014-02-03: 50 mg via ORAL
  Filled 2014-01-31: qty 1

## 2014-01-31 MED ORDER — MIDAZOLAM HCL 5 MG/5ML IJ SOLN
INTRAMUSCULAR | Status: DC | PRN
  Administered 2014-01-31 (×2): 1 mg via INTRAVENOUS

## 2014-01-31 MED ORDER — HYDROMORPHONE HCL PF 1 MG/ML IJ SOLN
INTRAMUSCULAR | Status: AC
  Administered 2014-01-31: 0.5 mg via INTRAVENOUS
  Filled 2014-01-31: qty 1

## 2014-01-31 MED ORDER — DIPHENHYDRAMINE HCL 12.5 MG/5ML PO ELIX
12.5000 mg | ORAL_SOLUTION | Freq: Four times a day (QID) | ORAL | Status: DC | PRN
  Filled 2014-01-31: qty 5

## 2014-01-31 MED ORDER — LIDOCAINE HCL (CARDIAC) 20 MG/ML IV SOLN
INTRAVENOUS | Status: AC
  Filled 2014-01-31: qty 5

## 2014-01-31 MED ORDER — NEOSTIGMINE METHYLSULFATE 1 MG/ML IJ SOLN
INTRAMUSCULAR | Status: DC | PRN
  Administered 2014-01-31: 4 mg via INTRAVENOUS

## 2014-01-31 MED ORDER — ACETAMINOPHEN 160 MG/5ML PO SOLN: 1000.0000 mg | Freq: Four times a day (QID) | ORAL | Status: AC

## 2014-01-31 MED ORDER — OXYCODONE HCL 5 MG PO TABS: 5.0000 mg | ORAL_TABLET | ORAL | Status: AC | PRN

## 2014-01-31 MED ORDER — FENTANYL CITRATE 0.05 MG/ML IJ SOLN
INTRAMUSCULAR | Status: AC
Start: 2014-01-31 — End: 2014-01-31
  Filled 2014-01-31: qty 5

## 2014-01-31 MED ORDER — ONDANSETRON HCL 4 MG/2ML IJ SOLN
INTRAMUSCULAR | Status: DC | PRN
  Administered 2014-01-31: 4 mg via INTRAVENOUS

## 2014-01-31 MED ORDER — LACTATED RINGERS IV SOLN
INTRAVENOUS | Status: DC | PRN
  Administered 2014-01-31: 11:00:00 via INTRAVENOUS

## 2014-01-31 MED ORDER — ONDANSETRON HCL 4 MG/2ML IJ SOLN
4.0000 mg | Freq: Four times a day (QID) | INTRAMUSCULAR | Status: DC | PRN
  Administered 2014-01-31 – 2014-02-02 (×4): 4 mg via INTRAVENOUS
  Filled 2014-01-31 (×4): qty 2

## 2014-01-31 MED ORDER — MIDAZOLAM HCL 2 MG/2ML IJ SOLN
INTRAMUSCULAR | Status: AC
  Filled 2014-01-31: qty 2

## 2014-01-31 MED ORDER — OXYCODONE HCL 5 MG/5ML PO SOLN: 5.0000 mg | Freq: Once | ORAL | Status: DC | PRN

## 2014-01-31 MED ORDER — OXYCODONE-ACETAMINOPHEN 5-325 MG PO TABS
1.0000 | ORAL_TABLET | ORAL | Status: DC | PRN
  Administered 2014-02-02 (×2): 1 via ORAL
  Administered 2014-02-02 – 2014-02-04 (×2): 2 via ORAL
  Filled 2014-01-31 (×2): qty 2
  Filled 2014-01-31 (×2): qty 1

## 2014-01-31 MED ORDER — ACETAMINOPHEN 500 MG PO TABS
1000.0000 mg | ORAL_TABLET | Freq: Four times a day (QID) | ORAL | Status: AC
  Administered 2014-01-31 – 2014-02-01 (×3): 1000 mg via ORAL
  Filled 2014-01-31 (×3): qty 2

## 2014-01-31 MED ORDER — FENTANYL CITRATE 0.05 MG/ML IJ SOLN
INTRAMUSCULAR | Status: AC
  Filled 2014-01-31: qty 2

## 2014-01-31 MED ORDER — OXYCODONE HCL 5 MG PO TABS: 5.0000 mg | ORAL_TABLET | Freq: Once | ORAL | Status: DC | PRN

## 2014-01-31 MED ORDER — BISACODYL 5 MG PO TBEC
10.0000 mg | DELAYED_RELEASE_TABLET | Freq: Every day | ORAL | Status: DC
  Administered 2014-01-31 – 2014-02-03 (×4): 10 mg via ORAL
  Filled 2014-01-31 (×5): qty 2

## 2014-01-31 MED ORDER — SENNOSIDES-DOCUSATE SODIUM 8.6-50 MG PO TABS
1.0000 | ORAL_TABLET | Freq: Every evening | ORAL | Status: DC | PRN
  Filled 2014-01-31: qty 1

## 2014-01-31 MED ORDER — SODIUM CHLORIDE 0.9 % IJ SOLN: 9.0000 mL | INTRAMUSCULAR | Status: DC | PRN

## 2014-01-31 MED ORDER — ROCURONIUM BROMIDE 50 MG/5ML IV SOLN
INTRAVENOUS | Status: AC
  Filled 2014-01-31: qty 1

## 2014-01-31 MED ORDER — KCL IN DEXTROSE-NACL 20-5-0.45 MEQ/L-%-% IV SOLN
INTRAVENOUS | Status: DC
  Administered 2014-01-31: 100 mL/h via INTRAVENOUS
  Administered 2014-02-01 (×2): via INTRAVENOUS
  Administered 2014-02-01: 100 mL via INTRAVENOUS
  Administered 2014-02-02 – 2014-02-03 (×2): via INTRAVENOUS
  Filled 2014-01-31 (×9): qty 1000

## 2014-01-31 MED ORDER — HYDROMORPHONE HCL PF 1 MG/ML IJ SOLN
0.2500 mg | INTRAMUSCULAR | Status: DC | PRN
  Administered 2014-01-31 (×4): 0.5 mg via INTRAVENOUS

## 2014-01-31 MED ORDER — BUPIVACAINE ON-Q PAIN PUMP (FOR ORDER SET NO CHG)
INJECTION | Status: AC
  Filled 2014-01-31: qty 1

## 2014-01-31 MED ORDER — PROPOFOL 10 MG/ML IV BOLUS
INTRAVENOUS | Status: AC
  Filled 2014-01-31: qty 20

## 2014-01-31 MED ORDER — NEOSTIGMINE METHYLSULFATE 1 MG/ML IJ SOLN
INTRAMUSCULAR | Status: AC
  Filled 2014-01-31: qty 10

## 2014-01-31 MED ORDER — ONDANSETRON HCL 4 MG/2ML IJ SOLN
INTRAMUSCULAR | Status: AC
  Filled 2014-01-31: qty 4

## 2014-01-31 MED ORDER — DIPHENHYDRAMINE HCL 50 MG/ML IJ SOLN: 12.5000 mg | Freq: Four times a day (QID) | INTRAMUSCULAR | Status: DC | PRN

## 2014-01-31 MED ORDER — ONDANSETRON HCL 4 MG/2ML IJ SOLN: 4.0000 mg | Freq: Four times a day (QID) | INTRAMUSCULAR | Status: DC | PRN

## 2014-01-31 MED ORDER — BUPIVACAINE 0.5 % ON-Q PUMP SINGLE CATH 400 ML
400.0000 mL | INJECTION | Status: DC
  Filled 2014-01-31: qty 400

## 2014-01-31 MED ORDER — MIDAZOLAM HCL 2 MG/2ML IJ SOLN
INTRAMUSCULAR | Status: AC
Start: 2014-01-31 — End: 2014-01-31
  Filled 2014-01-31: qty 2

## 2014-01-31 MED ORDER — ROCURONIUM BROMIDE 100 MG/10ML IV SOLN
INTRAVENOUS | Status: DC | PRN
  Administered 2014-01-31: 40 mg via INTRAVENOUS
  Administered 2014-01-31: 20 mg via INTRAVENOUS
  Administered 2014-01-31: 10 mg via INTRAVENOUS

## 2014-01-31 MED ORDER — POTASSIUM CHLORIDE 10 MEQ/50ML IV SOLN
10.0000 meq | Freq: Every day | INTRAVENOUS | Status: DC | PRN
  Filled 2014-01-31: qty 50

## 2014-01-31 MED ORDER — LACTATED RINGERS IV SOLN
INTRAVENOUS | Status: DC | PRN
  Administered 2014-01-31: 10:00:00 via INTRAVENOUS

## 2014-01-31 MED ORDER — GLYCOPYRROLATE 0.2 MG/ML IJ SOLN
INTRAMUSCULAR | Status: DC | PRN
  Administered 2014-01-31: 0.6 mg via INTRAVENOUS

## 2014-01-31 MED ORDER — 0.9 % SODIUM CHLORIDE (POUR BTL) OPTIME
TOPICAL | Status: DC | PRN
  Administered 2014-01-31: 1000 mL

## 2014-01-31 MED ORDER — ONDANSETRON HCL 4 MG/2ML IJ SOLN: 4.0000 mg | Freq: Once | INTRAMUSCULAR | Status: DC | PRN

## 2014-01-31 MED ORDER — PROPOFOL 10 MG/ML IV BOLUS
INTRAVENOUS | Status: DC | PRN
  Administered 2014-01-31: 100 mg via INTRAVENOUS
  Administered 2014-01-31: 20 mg via INTRAVENOUS

## 2014-01-31 MED ORDER — LACTATED RINGERS IV SOLN
INTRAVENOUS | Status: DC
  Administered 2014-01-31 (×2): via INTRAVENOUS

## 2014-01-31 MED ORDER — NALOXONE HCL 0.4 MG/ML IJ SOLN: 0.4000 mg | INTRAMUSCULAR | Status: DC | PRN

## 2014-01-31 MED ORDER — PHENYLEPHRINE HCL 10 MG/ML IJ SOLN
10.0000 mg | INTRAVENOUS | Status: DC | PRN
  Administered 2014-01-31: 15 ug/min via INTRAVENOUS

## 2014-01-31 MED ORDER — ROCURONIUM BROMIDE 50 MG/5ML IV SOLN
INTRAVENOUS | Status: AC
  Filled 2014-01-31: qty 2

## 2014-01-31 MED ORDER — GLYCOPYRROLATE 0.2 MG/ML IJ SOLN
INTRAMUSCULAR | Status: AC
Start: 2014-01-31 — End: 2014-01-31
  Filled 2014-01-31: qty 3

## 2014-01-31 MED ORDER — BUPIVACAINE 0.5 % ON-Q PUMP SINGLE CATH 400 ML
INJECTION | Status: DC | PRN
  Administered 2014-01-31: 400 mL

## 2014-01-31 MED ORDER — FENTANYL 10 MCG/ML IV SOLN
INTRAVENOUS | Status: DC
  Administered 2014-01-31: 90 ug via INTRAVENOUS
  Administered 2014-01-31: 15:00:00 via INTRAVENOUS
  Administered 2014-02-01: 210 ug via INTRAVENOUS
  Administered 2014-02-01: 135 ug via INTRAVENOUS
  Administered 2014-02-01: 105 ug via INTRAVENOUS
  Administered 2014-02-01: 131.7 ug via INTRAVENOUS
  Administered 2014-02-01: 105 ug via INTRAVENOUS
  Administered 2014-02-02: 135 ug via INTRAVENOUS
  Administered 2014-02-02: 105 ug via INTRAVENOUS
  Administered 2014-02-02: 150 ug via INTRAVENOUS
  Administered 2014-02-02: 60 ug via INTRAVENOUS
  Administered 2014-02-02: 01:00:00 via INTRAVENOUS
  Administered 2014-02-02: 105 ug via INTRAVENOUS
  Administered 2014-02-02: 75 ug via INTRAVENOUS
  Filled 2014-01-31 (×5): qty 50

## 2014-01-31 MED ORDER — MIDAZOLAM HCL 2 MG/2ML IJ SOLN: 1.0000 mg | Freq: Once | INTRAMUSCULAR | Status: DC

## 2014-01-31 MED ORDER — HYDROMORPHONE HCL PF 1 MG/ML IJ SOLN
INTRAMUSCULAR | Status: AC
  Filled 2014-01-31: qty 1

## 2014-01-31 SURGICAL SUPPLY — 90 items
APPLICATOR TIP COSEAL (VASCULAR PRODUCTS) IMPLANT
APPLICATOR TIP EXT COSEAL (VASCULAR PRODUCTS) IMPLANT
BLADE SURG 11 STRL SS (BLADE) ×3 IMPLANT
BRUSH CYTOL CELLEBRITY 1.5X140 (MISCELLANEOUS) IMPLANT
CANISTER SUCTION 2500CC (MISCELLANEOUS) ×3 IMPLANT
CATH KIT ON Q 5IN SLV (PAIN MANAGEMENT) IMPLANT
CATH KIT ON Q 7.5IN SLV (PAIN MANAGEMENT) ×3 IMPLANT
CATH THORACIC 28FR (CATHETERS) ×3 IMPLANT
CATH THORACIC 36FR (CATHETERS) IMPLANT
CATH THORACIC 36FR RT ANG (CATHETERS) IMPLANT
CLIP TI MEDIUM 6 (CLIP) ×3 IMPLANT
CLIP TI WIDE RED SMALL 24 (CLIP) ×3 IMPLANT
CONN ST 1/4X3/8  BEN (MISCELLANEOUS) ×1
CONN ST 1/4X3/8 BEN (MISCELLANEOUS) ×2 IMPLANT
CONN Y 3/8X3/8X3/8  BEN (MISCELLANEOUS) ×1
CONN Y 3/8X3/8X3/8 BEN (MISCELLANEOUS) ×2 IMPLANT
CONT SPEC 4OZ CLIKSEAL STRL BL (MISCELLANEOUS) ×6 IMPLANT
COVER TABLE BACK 60X90 (DRAPES) ×3 IMPLANT
DERMABOND ADVANCED (GAUZE/BANDAGES/DRESSINGS)
DERMABOND ADVANCED .7 DNX12 (GAUZE/BANDAGES/DRESSINGS) IMPLANT
DRAIN CHANNEL 28F RND 3/8 FF (WOUND CARE) IMPLANT
DRAIN CHANNEL 32F RND 10.7 FF (WOUND CARE) ×3 IMPLANT
DRAPE LAPAROSCOPIC ABDOMINAL (DRAPES) ×3 IMPLANT
DRAPE WARM FLUID 44X44 (DRAPE) ×3 IMPLANT
DRILL BIT 7/64X5 (BIT) IMPLANT
ELECT BLADE 4.0 EZ CLEAN MEGAD (MISCELLANEOUS) ×3
ELECT REM PT RETURN 9FT ADLT (ELECTROSURGICAL) ×3
ELECTRODE BLDE 4.0 EZ CLN MEGD (MISCELLANEOUS) ×2 IMPLANT
ELECTRODE REM PT RTRN 9FT ADLT (ELECTROSURGICAL) ×2 IMPLANT
FORCEPS BIOP RJ4 1.8 (CUTTING FORCEPS) IMPLANT
GLOVE BIO SURGEON STRL SZ 6 (GLOVE) ×3 IMPLANT
GLOVE BIO SURGEON STRL SZ 6.5 (GLOVE) ×9 IMPLANT
GLOVE BIO SURGEON STRL SZ7 (GLOVE) ×3 IMPLANT
GLOVE BIOGEL PI IND STRL 6.5 (GLOVE) ×8 IMPLANT
GLOVE BIOGEL PI IND STRL 7.0 (GLOVE) ×2 IMPLANT
GLOVE BIOGEL PI INDICATOR 6.5 (GLOVE) ×4
GLOVE BIOGEL PI INDICATOR 7.0 (GLOVE) ×1
GOWN STRL REUS W/ TWL LRG LVL3 (GOWN DISPOSABLE) ×10 IMPLANT
GOWN STRL REUS W/TWL LRG LVL3 (GOWN DISPOSABLE) ×5
HANDLE STAPLE ENDO GIA SHORT (STAPLE) ×1
KIT BASIN OR (CUSTOM PROCEDURE TRAY) ×3 IMPLANT
KIT ROOM TURNOVER OR (KITS) ×3 IMPLANT
KIT SUCTION CATH 14FR (SUCTIONS) ×3 IMPLANT
MARKER SKIN DUAL TIP RULER LAB (MISCELLANEOUS) ×3 IMPLANT
NEEDLE BIOPSY TRANSBRONCH 21G (NEEDLE) IMPLANT
NS IRRIG 1000ML POUR BTL (IV SOLUTION) ×6 IMPLANT
OIL SILICONE PENTAX (PARTS (SERVICE/REPAIRS)) ×3 IMPLANT
PACK CHEST (CUSTOM PROCEDURE TRAY) ×3 IMPLANT
PAD ARMBOARD 7.5X6 YLW CONV (MISCELLANEOUS) ×9 IMPLANT
PASSER SUT SWANSON 36MM LOOP (INSTRUMENTS) ×3 IMPLANT
RELOAD EGIA 60 MED/THCK PURPLE (STAPLE) ×9 IMPLANT
RELOAD TRI 2.0 60 XTHK VAS SUL (STAPLE) ×6 IMPLANT
SCISSORS LAP 5X35 DISP (ENDOMECHANICALS) IMPLANT
SEALANT PROGEL (MISCELLANEOUS) IMPLANT
SEALANT SURG COSEAL 4ML (VASCULAR PRODUCTS) IMPLANT
SEALANT SURG COSEAL 8ML (VASCULAR PRODUCTS) ×3 IMPLANT
SOLUTION ANTI FOG 6CC (MISCELLANEOUS) ×3 IMPLANT
SPONGE GAUZE 4X4 12PLY (GAUZE/BANDAGES/DRESSINGS) ×3 IMPLANT
SPONGE GAUZE 4X4 12PLY STER LF (GAUZE/BANDAGES/DRESSINGS) ×3 IMPLANT
STAPLER ENDO GIA 12MM SHORT (STAPLE) ×2 IMPLANT
SUT PROLENE 3 0 SH DA (SUTURE) IMPLANT
SUT PROLENE 4 0 RB 1 (SUTURE)
SUT PROLENE 4-0 RB1 .5 CRCL 36 (SUTURE) IMPLANT
SUT SILK  1 MH (SUTURE) ×4
SUT SILK 1 MH (SUTURE) ×8 IMPLANT
SUT SILK 2 0 SH (SUTURE) IMPLANT
SUT SILK 2 0SH CR/8 30 (SUTURE) IMPLANT
SUT SILK 3 0SH CR/8 30 (SUTURE) IMPLANT
SUT STEEL 1 (SUTURE) IMPLANT
SUT VIC AB 1 CTX 18 (SUTURE) ×3 IMPLANT
SUT VIC AB 1 CTX 36 (SUTURE)
SUT VIC AB 1 CTX36XBRD ANBCTR (SUTURE) IMPLANT
SUT VIC AB 2-0 CTX 36 (SUTURE) ×3 IMPLANT
SUT VIC AB 2-0 UR6 27 (SUTURE) IMPLANT
SUT VIC AB 3-0 SH 8-18 (SUTURE) IMPLANT
SUT VIC AB 3-0 X1 27 (SUTURE) ×3 IMPLANT
SUT VICRYL 2 TP 1 (SUTURE) ×3 IMPLANT
SWAB COLLECTION DEVICE MRSA (MISCELLANEOUS) IMPLANT
SYR 20ML ECCENTRIC (SYRINGE) ×3 IMPLANT
SYSTEM SAHARA CHEST DRAIN ATS (WOUND CARE) ×3 IMPLANT
TAPE CLOTH SURG 4X10 WHT LF (GAUZE/BANDAGES/DRESSINGS) ×3 IMPLANT
TIP APPLICATOR SPRAY EXTEND 16 (VASCULAR PRODUCTS) IMPLANT
TOWEL OR 17X24 6PK STRL BLUE (TOWEL DISPOSABLE) ×6 IMPLANT
TOWEL OR 17X26 10 PK STRL BLUE (TOWEL DISPOSABLE) ×6 IMPLANT
TRAP SPECIMEN MUCOUS 40CC (MISCELLANEOUS) ×6 IMPLANT
TRAY FOLEY CATH 14FRSI W/METER (CATHETERS) ×3 IMPLANT
TUBE ANAEROBIC SPECIMEN COL (MISCELLANEOUS) IMPLANT
TUBE CONNECTING 12X1/4 (SUCTIONS) ×9 IMPLANT
TUNNELER SHEATH ON-Q 11GX8 DSP (PAIN MANAGEMENT) ×3 IMPLANT
WATER STERILE IRR 1000ML POUR (IV SOLUTION) ×6 IMPLANT

## 2014-01-31 NOTE — Anesthesia Postprocedure Evaluation (Signed)
  Anesthesia Post-op Note  Patient: Bruce [name]  Procedure(s) Performed: Procedure(s) with comments: VIDEO BRONCHOSCOPY (N/A) VIDEO ASSISTED THORACOSCOPY (VATS)/WEDGE RESECTION (Right) - with insertion of On Q pain pump LYMPH NODE DISSECTION (Right)  Patient Location: PACU  Anesthesia Type:General  Level of Consciousness: awake, alert  and oriented  Airway and Oxygen Therapy: Patient Spontanous Breathing  Post-op Pain: mild  Post-op Assessment: Post-op Vital signs reviewed, Patient's Cardiovascular Status Stable, Respiratory Function Stable, Patent Airway, No signs of Nausea or vomiting and Pain level controlled  Post-op Vital Signs: stable  Complications: No apparent anesthesia complications

## 2014-01-31 NOTE — Anesthesia Procedure Notes (Addendum)
Procedure Name: Intubation Date/Time: 01/31/2014 11:41 AM Performed by: Erik Obey Pre-anesthesia Checklist: Patient identified, Emergency Drugs available, Suction available, Patient being monitored and Timeout performed Patient Re-evaluated:Patient Re-evaluated prior to inductionOxygen Delivery Method: Circle system utilized Preoxygenation: Pre-oxygenation with 100% oxygen Intubation Type: IV induction Ventilation: Mask ventilation without difficulty Laryngoscope Size: Mac and 3 Grade View: Grade I Tube type: Oral Endobronchial tube: Left, Double lumen EBT, EBT position confirmed by fiberoptic bronchoscope and EBT position confirmed by auscultation and 35 Fr Number of attempts: 1 Airway Equipment and Method: Stylet and Fiberoptic brochoscope Placement Confirmation: ETT inserted through vocal cords under direct vision,  positive ETCO2 and breath sounds checked- equal and bilateral Secured at: 31 cm Tube secured with: Tape Dental Injury: Teeth and Oropharynx as per pre-operative assessment

## 2014-01-31 NOTE — OR Nursing (Signed)
Bronch completed at 1127.

## 2014-01-31 NOTE — H&P (Signed)
[address]       [place] [address]             [phone]                       Bruce [name] [place] Medical Record #[id number] Date of Birth: 09/16/1961  Referring: Dr Phoebe Perch  Primary Care: No PCP Per Patient  Chief Complaint:    Rt lung mass   History of Present Illness:    Bruce [name] 53 y.o. male  underwent total esophagectomy with cervical esophagogastrostomy October 2013 after a course of preoperative radiation and chemotherapy for squamous cell carcinoma of the esophagus. On followup scans the patient was noted to have an increasing right lung lesion. Needle biopsy several weeks ago demonstrated squamous cell carcinoma, from a pathologic standpoint it's unclear if this is a new second primary starting in the lung or a metastatic deposit. There possibly of  N1 nodes involved on the right by PET scan. There is no other activity on PET scan to suggest metastatic disease.   The patient has been a smoker in the past but quit smoking in 2008.    Current Activity/ Functional Status:  Patient is independent with mobility/ambulation, transfers, ADL's, IADL's.   Zubrod Score: At the time of surgery this patient's most appropriate activity status/level should be described as: []     0    Normal activity, no symptoms [x]     1    Restricted in physical strenuous activity but ambulatory, able to do out light work []     2    Ambulatory and capable of self care, unable to do work activities, up and about               >50 % of waking hours                              []     3    Only limited self care, in bed greater than 50% of waking hours []     4    Completely disabled, no self care, confined to bed or chair []     5    Moribund   Past Medical History  Diagnosis Date  . Esophagitis   . Mass of esophagus 05/28/2012    BX'D DISTAL ESOPHAGUS,PENDING  . Dysphasia     solid and liquid  . Neuropathy     right hand numbness 1 year 2012  . Cancer  05/28/12    bx=esophagus=squamous cell carcinoma  . Lung cancer 12/27/13    right upper lung    Past Surgical History  Procedure Laterality Date  . 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 Eus  06/11/2012    Procedure: UPPER ENDOSCOPIC ULTRASOUND (EUS) RADIAL;  Surgeon: Milus Banister, MD;  Location: [facility];  Service: Endoscopy;  Laterality: N/A;  . Complete esophagectomy  08/26/2012    Procedure: ESOPHAGECTOMY COMPLETE;  Surgeon: [name], MD;  Location: [facility];  Service: Thoracic;  Laterality: N/A;  transhiatal   . Pyloroplasty  08/26/2012    Procedure: PYLOROPLASTY;  Surgeon: [name], MD;  Location: [facility];  Service: Thoracic;  Laterality: N/A;  . Video bronchoscopy  08/26/2012    Procedure: VIDEO BRONCHOSCOPY;  Surgeon: [name], MD;  Location: [facility] OR;  Service: Thoracic;  Laterality: N/A;  . Jejunostomy  08/26/2012    Procedure: JEJUNOSTOMY;  Surgeon: [name], MD;  Location: [facility] OR;  Service: Thoracic;  Laterality: N/A;  placement of feeding jujunostomy tube    Family History  Problem Relation Age of Onset  . Breast cancer Sister     History   Social History  . Marital Status: Married    Spouse Name: N/A    Number of Children: N/A  . Years of Education: N/A   Occupational History  . welder    Social History Main Topics  . Smoking status: Former Smoker -- 0.40 packs/day for 25 years    Types: Cigarettes    Quit date: 06/19/2011  . Smokeless tobacco: Former Systems developer    Quit date: 06/03/2007  . Alcohol Use: No     Comment: not currently (01/28/14)  . Drug Use: No  . Sexual Activity: Not on file   Other Topics Concern  . Not on file   Social History Narrative  . No narrative on file    History  Smoking status  . Former Smoker -- 0.40 packs/day for 25 years  . Types: Cigarettes  . Quit date: 06/19/2011  Smokeless tobacco  . Former Systems developer  . Quit  date: 06/03/2007    History  Alcohol Use No    Comment: not currently (01/28/14)     No Known Allergies  Current Facility-Administered Medications  Medication Dose Route Frequency Provider Last Rate Last Dose  . 0.9 % irrigation (POUR BTL)    PRN [name], MD   1,000 mL at 01/31/14 1047  . cefUROXime (ZINACEF) 1.5 g in dextrose 5 % 50 mL IVPB  1.5 g Intravenous 60 min Pre-Op [name], MD      . lactated ringers infusion   Intravenous Continuous Kate Sable, MD 50 mL/hr at 01/31/14 0950     Facility-Administered Medications Ordered in Other Encounters  Medication Dose Route Frequency Provider Last Rate Last Dose  . fentaNYL (SUBLIMAZE) injection    Anesthesia Intra-op Lezlie Octave. Hypes, CRNA   50 mcg at 01/31/14 1040  . lactated ringers infusion    Continuous PRN Lezlie Octave. Hypes, CRNA      . lactated ringers infusion    Continuous PRN Lezlie Octave. Hypes, CRNA      . midazolam (VERSED) 5 MG/5ML injection    Anesthesia Intra-op Lezlie Octave. Hypes, CRNA   1 mg at 01/31/14 1017     Review of Systems:     Cardiac Review of Systems: Y or N  Chest Pain [ n   ]  Resting SOB [ n  ] Exertional SOB  [n  ]  Orthopnea [ n ]   Pedal Edema [ n  ]    Palpitations [ n ] Syncope  [ n ]   Presyncope [  n ]  General Review of Systems: [Y] = yes [  ]=no Constitional: recent weight change [n  ];  Wt loss over the last 3 months [   ] anorexia [  ]; fatigue Blue.Reese  ]; nausea [ n ]; night sweats [ n ]; fever [ n ]; or chills [n  ];          Dental: poor dentition[  ]; Last Dentist visit:   Eye : blurred vision [  ]; diplopia [   ]; vision changes [  ];  Amaurosis fugax[  ]; Resp: cough [  ];  wheezing[n  ];  hemoptysis[ n ]; shortness of breath[ n ]; paroxysmal nocturnal dyspnea[ n ]; dyspnea on exertion[ n ]; or orthopnea[  ];  GI:  gallstones[  ], vomiting[  ];  dysphagia[  ]; melena[  ];  hematochezia [  ]; heartburn[  ];   Hx of  Colonoscopy[  ]; GU: kidney stones [  ]; hematuria[  ];   dysuria [n   ];  nocturia[  ];  history of     obstruction [ n ]; urinary frequency [ n ]             Skin: rash, swelling[  ];, hair loss[  ];  peripheral edema[  ];  or itching[  ]; Musculosketetal: myalgias[  ];  joint swelling[  ];  joint erythema[  ];  joint pain[  ];  back pain[  ];  Heme/Lymph: bruising[  ];  bleeding[n  ];  anemia[  ];  Neuro: TIA[  ];  headaches[  ];  stroke[ n ];  vertigo[  ];  seizures[  ];   paresthesias[  ];  difficulty walking[n  ];  Psych:depression[  ]; anxiety[  ];  Endocrine: diabetes[  ];  thyroid dysfunction[  ];  Immunizations: Flu up to date Blue.Reese  ]; Pneumococcal up to date [  y];  Other:  Physical Exam: BP 137/84  Pulse 71  Temp(Src) 98.2 F (36.8 C) (Oral)  Resp 13  SpO2 100%  PHYSICAL EXAMINATION:  General appearance: alert, cooperative, appears stated age and no distress Neurologic: intact Heart: regular rate and rhythm, S1, S2 normal, no murmur, click, rub or gallop Lungs: clear to auscultation bilaterally Abdomen: soft, non-tender; bowel sounds normal; no masses,  no organomegaly Extremities: extremities normal, atraumatic, no cyanosis or edema and Homans sign is negative, no sign of DVT Wound: Patient's incisions in the left neck and mid abdomen are well-healed, he has bilateral chest tube sites well healed from chest tubes placed at the time of his esophagectomy Patient has no cervical or supraclavicular or axillary adenopathy.  Diagnostic Studies & Laboratory data:     Recent Radiology Findings:   Dg Chest 1 View  12/27/2013   CLINICAL DATA:  Post right upper lobe nodule biopsy, no chest pain or shortness of breath.  EXAM: CHEST - 1 VIEW  COMPARISON:  CT BIOPSY dated 12/27/2013; CT CHEST W/CM dated 12/16/2013; DG CHEST 2 VIEW dated 03/04/2013  FINDINGS: Grossly unchanged borderline enlarged cardiac silhouette and mediastinal contours. Known centrally cavitary nodule within the right upper lobe is not well demonstrated on the present examination. No  pleural effusion or pneumothorax following right upper lobe lung nodule biopsy. No evidence of edema. Unchanged bones.  IMPRESSION: No evidence of complication following right upper lung nodule biopsy.   Electronically Signed   By: Sandi Mariscal M.D.   On: 12/27/2013 12:01   Nm Pet Image Restag (ps) Skull Base To Thigh  01/11/2014   CLINICAL DATA:  Subsequent treatment strategy for esophageal cancer. Recent biopsy of a cavitary right upper lobe lung lesion revealed invasive squamous cell carcinoma.  EXAM: NUCLEAR MEDICINE PET SKULL BASE TO THIGH  FASTING BLOOD GLUCOSE:  Value: 92 mg/dl  TECHNIQUE: 6.3 mCi F-18 FDG was injected intravenously. Full-ring PET imaging was performed from the skull base to thigh after the radiotracer. CT data was obtained and used for attenuation correction and anatomic localization.  COMPARISON:  CT BIOPSY dated 12/27/2013  FINDINGS: NECK  No hypermetabolic lymph nodes in the neck.  CHEST  Gastric pull-through noted. Cavitary posterior right upper lobe mass, 2.1 x 1.5 cm, maximum standard uptake value 7.7. Posterior left infrahilar lymph node, 2.3 x 2.0 cm, maximum standard uptake value 9.8. Adjacent indistinct infrahilar node has maximum standard uptake value of 5.9.  A vascular structure or small lymph node posterior to the descending thoracic aorta has a maximum standard uptake value of 3.4 and is probably incidental.  ABDOMEN/PELVIS  Mid transverse colon has some faintly increased signal but is also small in caliber, unless this increased signal (standard uptake value 6.1) is probably physiologic. Mildly prominent but gas-filled appendix does not appear actively inflamed. It is  SKELETON  No focal hypermetabolic activity to suggest skeletal metastasis.  IMPRESSION: 1. Hypermetabolic cavitary right upper lobe mass corresponding to the known invasive squamous cell carcinoma. Pathologic right infrahilar adenopathy. 2. Faintly increased activity in the mid transverse colon is probably  physiologic.   Electronically Signed   By: Sherryl Barters M.D.   On: 01/11/2014 11:46   Ct Biopsy  12/27/2013   INDICATION: History of esophageal cancer, now with enlarging centrally necrotic nodule within the right upper lobe. Please obtain biopsy to evaluate for either metastatic disease versus primary lung cancer.  EXAM: CT BIOPSY  MEDICATIONS: Fentanyl 75 mcg IV; Versed 1.5 mg IV  ANESTHESIA/SEDATION: Sedation time  20 minutes  CONTRAST:  None  COMPARISON:  NM PET IMAGE INITIAL (PI) SKULL BASE TO THIGH dated 06/11/2012; CT CHEST W/CM dated 12/16/2013; CT CHEST W/CM dated 06/15/2013  PROCEDURE: Informed consent was obtained from the patient (via the use of a medical translator) following an explanation of the procedure, risks, benefits and alternatives. The patient understands,agrees and consents for the procedure. All questions were addressed. A time out was performed prior to the initiation of the procedure.  The patient was positioned right lateral decubitus on the CT table and a limited chest CT was performed for procedural planning demonstrating no change to minimal increase in size of the now approximately 1.7 x 1.4 cm nodule within the right upper lobe (image 5, series 3). Note, there has been interval increased central cavitation of this nodule. The operative site was prepped and draped in the usual sterile fashion. Under sterile conditions and local anesthesia, a 22 gauge spinal needle was utilized for procedural planning. This was followed by the placement of a 17 gauge coaxial needle into the peripheral caudal aspect of the nodule. Positioning was confirmed with intermittent CT fluoroscopy and followed by the acquisition of 2 core needle biopsies with an 18 gauge core needle biopsy device.  Limited post procedural chest CT was negative for pneumothorax or additional complication. The co-axial needle was removed and hemostasis was achieved with manual compression. A dressing was placed. The patient  tolerated the procedure well without immediate postprocedural complication. The patient was escorted to have an upright chest radiograph.  COMPLICATIONS: None immediate.  IMPRESSION: Technically successful CT guided core needle core biopsy of enlarging centrally necrotic nodule within the right upper lobe.   Electronically Signed   By: Sandi Mariscal M.D.   On: 12/27/2013 13:19      Recent Lab Findings: Lab Results  Component Value Date   WBC 6.5 01/28/2014   HGB 15.4 01/28/2014   HCT 43.8 01/28/2014   PLT 182 01/28/2014   GLUCOSE 91 01/28/2014   ALT 26 01/28/2014   AST 30 01/28/2014   NA 138 01/28/2014   K 4.4 01/28/2014   CL 105 01/28/2014   CREATININE 0.77 01/28/2014   BUN  9 01/28/2014   CO2 18* 01/28/2014   INR 0.90 01/28/2014      Assessment / Plan:   I discussed with the patient the pros and cons of proceeding with surgical resection, for what could be a primary lung cancer clinical stage IIA versus metastatic squamous cell of the esophagus. Since the patient has only a single pulmonary nodule we will proceed as if this is a primary lung cancer, knowing that it could easily be a metastatic deposit. I discussed with the patient proceeding with bronchoscopy with video-assisted thoracoscopy and lung resection probable wedge resection and lymph node dissection. The goals risks and alternatives of the planned surgical procedure Bronchoscopy, Right VATS, Lung resection  have been discussed with the patient in detail in the office. The risks of the procedure including death, infection, stroke, myocardial infarction, bleeding, blood transfusion have all been discussed specifically.  I have quoted Bruce [name] a 3 % of perioperative mortality and a complication rate as high as 25 %. The patient's questions have been answered.Bruce [name] is willing  to proceed with the planned procedure. The patient daughter who is fluent in [place] has been with the patient on each visit to ensure his questions have been  answered and understands the findings and recommendations     [name] MD   Bruce [name],Bruce [name] Office [phone]   Beeper [phone]  01/31/2014 11:01 AM

## 2014-01-31 NOTE — Anesthesia Preprocedure Evaluation (Addendum)
Anesthesia Evaluation  Patient identified by MRN, date of birth, ID band Patient awake    Reviewed: Allergy & Precautions, H&P , NPO status , Patient's Chart, lab work & pertinent test results  Airway Mallampati: II TM Distance: >3 FB Neck ROM: Full    Dental  (+) Dental Advisory Given   Pulmonary former smoker,          Cardiovascular     Neuro/Psych    GI/Hepatic   Endo/Other    Renal/GU      Musculoskeletal   Abdominal   Peds  Hematology   Anesthesia Other Findings   Reproductive/Obstetrics                         Anesthesia Physical Anesthesia Plan  ASA: III  Anesthesia Plan: General   Post-op Pain Management:    Induction: Intravenous  Airway Management Planned: Oral ETT and Double Lumen EBT  Additional Equipment:   Intra-op Plan:   Post-operative Plan: Extubation in OR  Informed Consent: I have reviewed the patients History and Physical, chart, labs and discussed the procedure including the risks, benefits and alternatives for the proposed anesthesia with the patient or authorized representative who has indicated his/her understanding and acceptance.   Dental advisory given  Plan Discussed with: Anesthesiologist, Surgeon and CRNA  Anesthesia Plan Comments:        Anesthesia Quick Evaluation

## 2014-01-31 NOTE — Progress Notes (Signed)
TCTS BRIEF SICU PROGRESS NOTE  Day of Surgery  S/P Procedure(s) (LRB): VIDEO BRONCHOSCOPY (N/A) VIDEO ASSISTED THORACOSCOPY (VATS)/WEDGE RESECTION (Right) LYMPH NODE DISSECTION (Right)   Resting comfortably Good analgesia O2 sats 98% on 4 L/min via [place] Chest tube output low volume, thin serosanguinous - no air leak UOP adequate  Plan: Continue routine early postop  Bruce [name] H 01/31/2014 6:48 PM

## 2014-01-31 NOTE — Transfer of Care (Signed)
Immediate Anesthesia Transfer of Care Note  Patient: Bruce [name]  Procedure(s) Performed: Procedure(s) with comments: VIDEO BRONCHOSCOPY (N/A) VIDEO ASSISTED THORACOSCOPY (VATS)/WEDGE RESECTION (Right) - with insertion of On Q pain pump LYMPH NODE DISSECTION (Right)  Patient Location: PACU  Anesthesia Type:General  Level of Consciousness: awake, alert  and oriented  Airway & Oxygen Therapy: Patient Spontanous Breathing and Patient connected to face mask oxygen  Post-op Assessment: Report given to PACU RN and Post -op Vital signs reviewed and stable  Post vital signs: Reviewed and stable  Complications: No apparent anesthesia complications

## 2014-01-31 NOTE — Preoperative (Signed)
Beta Blockers   Reason not to administer Beta Blockers:Not Applicable 

## 2014-01-31 NOTE — Brief Op Note (Addendum)
      [address]       [place] [address]             [phone]       01/31/2014  2:04 PM  PATIENT:  Abdulahad Mederos  53 y.o. 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

## 2014-02-01 ENCOUNTER — Inpatient Hospital Stay (HOSPITAL_COMMUNITY): Payer: BC Managed Care – PPO

## 2014-02-01 ENCOUNTER — Encounter (HOSPITAL_COMMUNITY): Payer: Self-pay | Admitting: Cardiothoracic Surgery

## 2014-02-01 LAB — BASIC METABOLIC PANEL
BUN: 11 mg/dL (ref 6–23)
CALCIUM: 8.3 mg/dL — AB (ref 8.4–10.5)
CO2: 25 mEq/L (ref 19–32)
Chloride: 104 mEq/L (ref 96–112)
Creatinine, Ser: 0.84 mg/dL (ref 0.50–1.35)
GFR calc Af Amer: >90 mL/min (ref >90)
GLUCOSE: 145 mg/dL — AB (ref 70–99)
Potassium: 4.1 mEq/L (ref 3.7–5.3)
Sodium: 140 mEq/L (ref 137–147)

## 2014-02-01 LAB — CBC
HEMATOCRIT: 37 % — AB (ref 39.0–52.0)
HEMOGLOBIN: 12.8 g/dL — AB (ref 13.0–17.0)
MCH: 30.4 pg (ref 26.0–34.0)
MCHC: 34.6 g/dL (ref 30.0–36.0)
MCV: 87.9 fL (ref 78.0–100.0)
Platelets: 158 10*3/uL (ref 150–400)
RBC: 4.21 MIL/uL — ABNORMAL LOW (ref 4.22–5.81)
RDW: 13.1 % (ref 11.5–15.5)
WBC: 8.7 10*3/uL (ref 4.0–10.5)

## 2014-02-01 LAB — POCT I-STAT 3, ART BLOOD GAS (G3+)
Bicarbonate: 25.7 mEq/L — ABNORMAL HIGH (ref 20.0–24.0)
O2 Saturation: 98 %
Patient temperature: 98.4
TCO2: 27 mmol/L (ref 0–100)
pCO2 arterial: 42.4 mmHg (ref 35.0–45.0)
pH, Arterial: 7.39 (ref 7.350–7.450)
pO2, Arterial: 107 mmHg — ABNORMAL HIGH (ref 80.0–100.0)

## 2014-02-01 MED ORDER — ENOXAPARIN SODIUM 30 MG/0.3ML ~~LOC~~ SOLN
30.0000 mg | SUBCUTANEOUS | Status: DC
  Administered 2014-02-01 – 2014-02-03 (×3): 30 mg via SUBCUTANEOUS
  Filled 2014-02-01 (×4): qty 0.3

## 2014-02-01 NOTE — Progress Notes (Signed)
Patient ID: Bruce [name], male   DOB: 1961/04/22, 53 y.o.   MRN: [id number] TCTS DAILY ICU PROGRESS NOTE                   [address]            [place] [address]          [phone]   1 Day Post-Op Procedure(s) (LRB): VIDEO BRONCHOSCOPY (N/A) VIDEO ASSISTED THORACOSCOPY (VATS)/WEDGE RESECTION; with insertion of On Q pain pump (Right) LYMPH NODE DISSECTION (Right)  Total Length of Stay:  LOS: 1 day   Subjective: Up to chair, daughter here with patient, he notes good pain control  Objective: Vital signs in last 24 hours: Temp:  [97.5 F (36.4 C)-98.8 F (37.1 C)] 98.4 F (36.9 C) (03/17 0400) Pulse Rate:  [69-102] 73 (03/17 0600) Cardiac Rhythm:  [-] Normal sinus rhythm (03/17 0700) Resp:  [8-27] 18 (03/17 0700) BP: (88-142)/(44-97) 111/73 mmHg (03/17 0700) SpO2:  [87 %-100 %] 97 % (03/17 0700) Arterial Line BP: (96-163)/(52-89) 114/54 mmHg (03/17 0700) Weight:  [128 lb 4.9 oz (58.2 kg)] 128 lb 4.9 oz (58.2 kg) (03/17 0600)  Filed Weights   02/01/14 0600  Weight: 128 lb 4.9 oz (58.2 kg)    Weight change:    Hemodynamic parameters for last 24 hours:    Intake/Output from previous day: 03/16 0701 - 03/17 0700 In: 3137.5 [I.V.:3087.5; IV Piggyback:50] Out: 2458 [Urine:1290; Blood:50; Chest Tube:340]  Intake/Output this shift:    Current Meds: Scheduled Meds: . acetaminophen  1,000 mg Oral 4 times per day   Or  . acetaminophen (TYLENOL) oral liquid 160 mg/5 mL  1,000 mg Oral 4 times per day  . bisacodyl  10 mg Oral Daily  . cefUROXime (ZINACEF)  IV  1.5 g Intravenous Q12H  . fentaNYL   Intravenous 6 times per day   Continuous Infusions: . bupivacaine ON-Q pain pump    . dextrose 5 % and 0.45 % NaCl with KCl 20 mEq/L 100 mL/hr at 02/01/14 0600  . lactated ringers Stopped (01/31/14 1859)   PRN Meds:.diphenhydrAMINE, diphenhydrAMINE, naloxone, ondansetron (ZOFRAN) IV, oxyCODONE, oxyCODONE-acetaminophen, potassium chloride, senna-docusate,  sodium chloride, traMADol  General appearance: alert, cooperative and no distress Neurologic: intact Heart: regular rate and rhythm, S1, S2 normal, no murmur, click, rub or gallop Lungs: clear to auscultation bilaterally Abdomen: soft, non-tender; bowel sounds normal; no masses,  no organomegaly Extremities: extremities normal, atraumatic, no cyanosis or edema and Homans sign is negative, no sign of DVT Wound: no air leaqk from ct  Lab Results: CBC:  Recent Labs  02/01/14 0445  WBC 8.7  HGB 12.8*  HCT 37.0*  PLT 158   BMET:   Recent Labs  02/01/14 0445  NA 140  K 4.1  CL 104  CO2 25  GLUCOSE 145*  BUN 11  CREATININE 0.84  CALCIUM 8.3*    PT/INR: No results found for this basename: LABPROT, INR,  in the last 72 hours Radiology: Dg Chest Portable 1 View  01/31/2014   CLINICAL DATA:  Video assistant thoracoscopy and wedge resection on the right  EXAM: PORTABLE CHEST - 1 VIEW  COMPARISON:  DG CHEST 2 VIEW dated 01/28/2014  FINDINGS: The patient has undergone right-sided thoracotomy. There are 2 chest tubes in place with their tips in the right pulmonary apex. There is no pneumothorax. The pulmonary interstitial markings are mildly prominent bilaterally. There is mediastinal shift from left to right which is greater than that  previously demonstrated. The left lung is well-expanded and also exhibits mildly increased interstitial markings. The cardiopericardial silhouette is mildly enlarged and the pulmonary vascularity is indistinct. A right internal jugular venous catheter has its tip at the junction of the proximal and middle thirds of the superior vena cava.  IMPRESSION: 1. There are postsurgical changes on the right without evidence of immediate complication. Positioning of the chest tubes is as described. There is no evidence of a pneumothorax currently. 2. The pulmonary interstitial markings are mildly increased bilaterally. The cardiopericardial silhouette also appears mildly  enlarged. 3. Increase in the left to right shift is present likely a sequelae of the surgical procedure.   Electronically Signed   By: David  Martinique   On: 01/31/2014 16:36     Assessment/Plan: S/P Procedure(s) (LRB): VIDEO BRONCHOSCOPY (N/A) VIDEO ASSISTED THORACOSCOPY (VATS)/WEDGE RESECTION; with insertion of On Q pain pump (Right) LYMPH NODE DISSECTION (Right) Mobilize d/c tubes/lines Plan for transfer to step-down: see transfer orders Ct  To water seal    Bruce [name] B 02/01/2014 7:46 AM

## 2014-02-01 NOTE — Progress Notes (Signed)
POD # 1 R VATS wedge  Sleeping in chair currently  BP 140/77  Pulse 68  Temp(Src) 98.2 F (36.8 C) (Oral)  Resp 9  Wt 128 lb 4.9 oz (58.2 kg)  SpO2 100%   Intake/Output Summary (Last 24 hours) at 02/01/14 1748 Last data filed at 02/01/14 1200  Gross per 24 hour  Intake 1855.84 ml  Output   1490 ml  Net 365.84 ml    Doing well, continue present care

## 2014-02-01 NOTE — Progress Notes (Signed)
Utilization Review Completed.  

## 2014-02-01 NOTE — Op Note (Signed)
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  He underwent preoperative chemotherapy and radiotherapy, and ultimately underwent total esophagectomy which he had tolerated well in follow up. The patient over the past 6 months was noted to have a new right upper lobe lung lesion.  A needle biopsy of this confirmed squamous cell carcinoma.  PET scan showed question of right hilar node, but no other evidence of metastatic disease.  A GI conference and Thoracic Oncology Conference both discussed the patient and with consultation with Pathology, it was unclear from a pathologic view that this was squamous cell carcinoma of metastatic from his esophagus or a new lung primary. We recommend to the patient to proceeding with resection of the right lung nodule and lymph node dissection.  The patient agreed and signed informed consent.  DESCRIPTION OF PROCEDURE:  The patient underwent general endotracheal anesthesia without  incident.  Through a single-lumen endotracheal tube, fiberoptic endoscope was placed to the subsegmental level both on the right and left tracheobronchial tree.  There was no evidence of endobronchial disease.  The scope was removed.  The patient's double- lumen endotracheal tube was placed.  The patient was turned in lateral decubitus position with the right side up, was prepped and draped. Appropriate time-out was performed and the right lung was collapsed and port site was made in approximately fifth intercostal space anteriorly and 1 in the midaxillary line.  A small incision was made, approximately fourth intercostal space in the midaxillary line through the incision and 2 port sites were able to identify the lesion which was posteriorly in the upper lobe, but very close to the superior segment of the right lower lobe.  A large 4R lymph node was dissected out of the anterior mediastinum and was sent for frozen section.  There was on opening of the chest a mild amount of yellow-colored pleural fluid.  This was collected and sent for cytology.  The 4R lymph node frozen section was negative.  In addition, the posteriorly at the level of AGUS vein and along the posterior to the trachea along the previously placed stomach in the chest which was easily identified.  A second lymph node labeled 3P was dissected free and sent to Pathology.  This confirmed squamous cell  carcinoma.  We then proceeded to perform a wedge resection with purple staplers of the lesion in the right upper lobe.  CoSeal was placed on a stapled edge.  The frozen section on the stapled margins was clear of tumor.  An On-Q device was placed subpleurally under visualization with the bat scope and secured in place.  A 28 Blake drain was left posterior.  A 28 chest tube was placed anterior through the 2 port sites.  The incision itself was closed with 2 pericostal sutures with the inferior rib holes drilled for the suture.   The muscle layer was closed with interrupted 3-0 Vicryl, running 2-0 Vicryl subcutaneous tissue, 3-0 subcuticular stitch on the skin edges.  Dermabond was applied.  The right lung reinflated nicely at the completion of the procedure, with very minimal air leak.  The patient was extubated in the operating room and transferred to the recovery room for postoperative care.  Sponge and needle count was reported as correct. Blood loss was minimal.  The patient tolerated the procedure without obvious complication.     Lanelle Bal, MD     EG/MEDQ  D:  01/31/2014  T:  02/01/2014  Job:  [id number]

## 2014-02-01 NOTE — Plan of Care (Signed)
Problem: Phase I Progression Outcomes Goal: Initial discharge plan identified Outcome: Completed/Met Date Met:  02/01/14 Home with wife and daughter.

## 2014-02-02 ENCOUNTER — Inpatient Hospital Stay (HOSPITAL_COMMUNITY): Payer: BC Managed Care – PPO

## 2014-02-02 LAB — COMPREHENSIVE METABOLIC PANEL
ALK PHOS: 92 U/L (ref 39–117)
ALT: 22 U/L (ref 0–53)
AST: 34 U/L (ref 0–37)
Albumin: 3.3 g/dL — ABNORMAL LOW (ref 3.5–5.2)
BUN: 8 mg/dL (ref 6–23)
CHLORIDE: 98 meq/L (ref 96–112)
CO2: 27 mEq/L (ref 19–32)
Calcium: 9.1 mg/dL (ref 8.4–10.5)
Creatinine, Ser: 0.77 mg/dL (ref 0.50–1.35)
GFR calc Af Amer: >90 mL/min (ref >90)
GFR calc non Af Amer: >90 mL/min (ref >90)
Glucose, Bld: 119 mg/dL — ABNORMAL HIGH (ref 70–99)
Potassium: 3.9 mEq/L (ref 3.7–5.3)
Sodium: 138 mEq/L (ref 137–147)
Total Bilirubin: 0.8 mg/dL (ref 0.3–1.2)
Total Protein: 6.7 g/dL (ref 6.0–8.3)

## 2014-02-02 LAB — CBC
HCT: 40.3 % (ref 39.0–52.0)
Hemoglobin: 13.7 g/dL (ref 13.0–17.0)
MCH: 30.4 pg (ref 26.0–34.0)
MCHC: 34 g/dL (ref 30.0–36.0)
MCV: 89.6 fL (ref 78.0–100.0)
PLATELETS: 173 10*3/uL (ref 150–400)
RBC: 4.5 MIL/uL (ref 4.22–5.81)
RDW: 13.4 % (ref 11.5–15.5)
WBC: 9.8 10*3/uL (ref 4.0–10.5)

## 2014-02-02 MED ORDER — PNEUMOCOCCAL VAC POLYVALENT 25 MCG/0.5ML IJ INJ
0.5000 mL | INJECTION | INTRAMUSCULAR | Status: DC
  Filled 2014-02-02: qty 0.5

## 2014-02-02 MED ORDER — METOPROLOL TARTRATE 1 MG/ML IV SOLN
5.0000 mg | Freq: Four times a day (QID) | INTRAVENOUS | Status: DC | PRN
  Administered 2014-02-02: 5 mg via INTRAVENOUS

## 2014-02-02 MED ORDER — DM-GUAIFENESIN ER 30-600 MG PO TB12
1.0000 | ORAL_TABLET | Freq: Two times a day (BID) | ORAL | Status: DC | PRN
  Administered 2014-02-02 (×2): 1 via ORAL
  Filled 2014-02-02 (×4): qty 1

## 2014-02-02 MED ORDER — METOPROLOL TARTRATE 25 MG PO TABS
25.0000 mg | ORAL_TABLET | Freq: Two times a day (BID) | ORAL | Status: DC
  Administered 2014-02-03 – 2014-02-04 (×3): 25 mg via ORAL
  Filled 2014-02-02 (×6): qty 1

## 2014-02-02 MED ORDER — INFLUENZA VAC SPLIT QUAD 0.5 ML IM SUSP
0.5000 mL | INTRAMUSCULAR | Status: DC
  Filled 2014-02-02: qty 0.5

## 2014-02-02 NOTE — Progress Notes (Signed)
CT surgery p.m. Rounds  Patient examined and record reviewed.Hemodynamics stable,labs satisfactory.Patient had stable day.Continue current care. VAN TRIGT III,PETER 02/02/2014

## 2014-02-02 NOTE — Progress Notes (Signed)
Patient's HR 120s-130s. BP 149/87. O2 sats 98% on 2LNC. Lung sounds rhonchus (unchanged from previous assessent.) No air leak on water sealed chest tube. Denies pain per family. Patient is grimacing currently.  He is having frequent productive coughing spells with thick, bloody secretions. Provided patient with PO pain medication (Fentanyl PCA has been d/c.) Will continue to closely monitor. Richarda Blade RN

## 2014-02-02 NOTE — Progress Notes (Addendum)
[facility] DAILY ICU PROGRESS NOTE                   [name]            [name]          [phone]   2 Days Post-Op Procedure(s) (LRB): VIDEO BRONCHOSCOPY (N/A) VIDEO ASSISTED THORACOSCOPY (VATS)/WEDGE RESECTION; with insertion of On Q pain pump (Right) LYMPH NODE DISSECTION (Right)  Total Length of Stay:  LOS: 2 days   Subjective: Feels ok, some soreness and congestion(daughter translating)  Objective: Vital signs in last 24 hours: Temp:  [97.9 F (36.6 C)-98.3 F (36.8 C)] 98.3 F (36.8 C) (03/18 0723) Pulse Rate:  [65-106] 93 (03/18 0800) Cardiac Rhythm:  [-] Normal sinus rhythm (03/18 0800) Resp:  [9-28] 12 (03/18 0807) BP: (110-140)/(69-91) 111/79 mmHg (03/18 0800) SpO2:  [91 %-100 %] 96 % (03/18 0807)  Filed Weights   02/01/14 0600  Weight: 128 lb 4.9 oz (58.2 kg)    Weight change:    Hemodynamic parameters for last 24 hours:    Intake/Output from previous day: 03/17 0701 - 03/18 0700 In: 1247.7 [I.V.:1197.7; IV Piggyback:50] Out: 9702 [Urine:1610; Chest Tube:160]  Intake/Output this shift: Total I/O In: 100 [I.V.:100] Out: 10 [Chest Tube:10]  Current Meds: Scheduled Meds: . bisacodyl  10 mg Oral Daily  . enoxaparin (LOVENOX) injection  30 mg Subcutaneous Q24H  . fentaNYL   Intravenous 6 times per day  . [START ON 02/03/2014] influenza vac split quadrivalent PF  0.5 mL Intramuscular Tomorrow-1000  . [START ON 02/03/2014] pneumococcal 23 valent vaccine  0.5 mL Intramuscular Tomorrow-1000   Continuous Infusions: . bupivacaine ON-Q pain pump    . dextrose 5 % and 0.45 % NaCl with KCl 20 mEq/L 50 mL/hr at 02/02/14 0800  . lactated ringers Stopped (01/31/14 1859)   PRN Meds:.diphenhydrAMINE, diphenhydrAMINE, naloxone, ondansetron (ZOFRAN) IV, oxyCODONE-acetaminophen, potassium chloride, senna-docusate, sodium chloride, traMADol  General appearance: alert, cooperative and no distress Heart: regular rate and rhythm Lungs:  coarse BS, dim in bases Abdomen: benign Extremities: warm Wound: incis healing well  Lab Results: CBC: Recent Labs  02/01/14 0445 02/02/14 0430  WBC 8.7 9.8  HGB 12.8* 13.7  HCT 37.0* 40.3  PLT 158 173   BMET:  Recent Labs  02/01/14 0445 02/02/14 0430  NA 140 138  K 4.1 3.9  CL 104 98  CO2 25 27  GLUCOSE 145* 119*  BUN 11 8  CREATININE 0.84 0.77  CALCIUM 8.3* 9.1    PT/INR: No results found for this basename: LABPROT, INR,  in the last 72 hours Radiology: Dg Chest Port 1 View  02/02/2014   CLINICAL DATA:  Chest tube  EXAM: PORTABLE CHEST - 1 VIEW  COMPARISON:  02/01/2014  FINDINGS: Two chest tubes on the right are unchanged. No pneumothorax identified.  Right jugular catheter tip in the lower SVC.  Increased atelectasis in the lung bases.  No significant effusion.  IMPRESSION: Increase in bibasilar atelectasis.  Negative for pneumothorax   Electronically Signed   By: Franchot Gallo M.D.   On: 02/02/2014 08:15   Dg Chest Port 1 View  02/01/2014   CLINICAL DATA:  Status post wedge resection of a right lung lesion  EXAM: PORTABLE CHEST - 1 VIEW  COMPARISON:  DG CHEST 1V PORT dated 01/31/2014  FINDINGS: The 2 right-sided chest tubes remain unchanged with their tips in the right pulmonary apex. No pneumothorax or significant pleural effusion is demonstrated. There  is a small amount of subcutaneous emphysema visible in the right axillary region. There is mild shift of the trachea toward the right which is stable. There is stable soft tissue fullness in the right hilar region. The left lung is well expanded and clear. The cardiopericardial silhouette is normal in size. The pulmonary vascularity is not engorged.  The right internal jugular venous catheter tip lies in the region of the mid to distal SVC.  IMPRESSION: 1. There is slightly better aeration of both lungs on today's study. There is no pneumothorax or significant pleural effusion. There is no evidence of CHF nor pneumonia. 2. The  chest tubes appear unchanged in position on the right. The right internal jugular venous catheter is also stable in appearance.   Electronically Signed   By: David  Martinique   On: 02/01/2014 08:23   Dg Chest Portable 1 View  01/31/2014   CLINICAL DATA:  Video assistant thoracoscopy and wedge resection on the right  EXAM: PORTABLE CHEST - 1 VIEW  COMPARISON:  DG CHEST 2 VIEW dated 01/28/2014  FINDINGS: The patient has undergone right-sided thoracotomy. There are 2 chest tubes in place with their tips in the right pulmonary apex. There is no pneumothorax. The pulmonary interstitial markings are mildly prominent bilaterally. There is mediastinal shift from left to right which is greater than that previously demonstrated. The left lung is well-expanded and also exhibits mildly increased interstitial markings. The cardiopericardial silhouette is mildly enlarged and the pulmonary vascularity is indistinct. A right internal jugular venous catheter has its tip at the junction of the proximal and middle thirds of the superior vena cava.  IMPRESSION: 1. There are postsurgical changes on the right without evidence of immediate complication. Positioning of the chest tubes is as described. There is no evidence of a pneumothorax currently. 2. The pulmonary interstitial markings are mildly increased bilaterally. The cardiopericardial silhouette also appears mildly enlarged. 3. Increase in the left to right shift is present likely a sequelae of the surgical procedure.   Electronically Signed   By: David  Martinique   On: 01/31/2014 16:36   Chest tube: + air leak    Assessment/Plan: S/P Procedure(s) (LRB): VIDEO BRONCHOSCOPY (N/A) VIDEO ASSISTED THORACOSCOPY (VATS)/WEDGE RESECTION; with insertion of On Q pain pump (Right) LYMPH NODE DISSECTION (Right)  1 doing well 2 small air leak- cont. Chest tubes to H2O seal 3 labs ok 4 add mucinex, push pulm toilet, rehab 5 poss tx to floor    GOLD,Bruce [name] 02/02/2014 9:15  AM  Orders for 3300/3s left yesterday I have seen and examined Bruce [name] and agree with the above assessment  and plan.  [name] MD Beeper [phone] Office [phone] 02/02/2014 12:55 PM  Path noted, Prob esophageal CA  I have seen and examined Bruce [name] and agree with the above assessment  and plan.  [name] MD Beeper [phone] Office [phone] 02/02/2014 12:56 PM

## 2014-02-03 ENCOUNTER — Inpatient Hospital Stay (HOSPITAL_COMMUNITY): Payer: BC Managed Care – PPO

## 2014-02-03 LAB — CULTURE, RESPIRATORY W GRAM STAIN

## 2014-02-03 MED ORDER — PNEUMOCOCCAL VAC POLYVALENT 25 MCG/0.5ML IJ INJ
0.5000 mL | INJECTION | INTRAMUSCULAR | Status: AC
  Administered 2014-02-04: 0.5 mL via INTRAMUSCULAR
  Filled 2014-02-03: qty 0.5

## 2014-02-03 MED ORDER — INFLUENZA VAC SPLIT QUAD 0.5 ML IM SUSP
0.5000 mL | INTRAMUSCULAR | Status: AC
  Administered 2014-02-04: 0.5 mL via INTRAMUSCULAR
  Filled 2014-02-03 (×2): qty 0.5

## 2014-02-03 NOTE — Progress Notes (Signed)
1330 transferred to 2w 07 via wheelchair with SCD's by Vickii Penna

## 2014-02-03 NOTE — Progress Notes (Signed)
Called and gave report to Margarita Grizzle, RN on 2W. Bed is still not cleaned/ready. Will continue to follow up. Richardean Sale, RN

## 2014-02-03 NOTE — Progress Notes (Signed)
Attempted to call report to Margarita Grizzle on 2W. She is currently assisting another patient and will return call when she is finished. Richardean Sale, RN

## 2014-02-03 NOTE — Progress Notes (Addendum)
[facility] DAILY ICU PROGRESS NOTE                   [address]            [place], [address]          [phone]   3 Days Post-Op Procedure(s) (LRB): VIDEO BRONCHOSCOPY (N/A) VIDEO ASSISTED THORACOSCOPY (VATS)/WEDGE RESECTION; with insertion of On Q pain pump (Right) LYMPH NODE DISSECTION (Right)  Total Length of Stay:  LOS: 3 days   Subjective: Feels ok, c/o cough.   Objective: Vital signs in last 24 hours: Temp:  [97.4 F (36.3 C)-98.5 F (36.9 C)] 98.4 F (36.9 C) (03/19 0727) Pulse Rate:  [71-122] 86 (03/19 0600) Cardiac Rhythm:  [-] Normal sinus rhythm (03/19 0600) Resp:  [7-32] 12 (03/19 0600) BP: (95-165)/(64-114) 121/83 mmHg (03/19 0600) SpO2:  [89 %-100 %] 97 % (03/19 0600)  Filed Weights   02/01/14 0600  Weight: 128 lb 4.9 oz (58.2 kg)    Weight change:    Hemodynamic parameters for last 24 hours:    Intake/Output from previous day: 03/18 0701 - 03/19 0700 In: 2183 [P.O.:820; I.V.:1363] Out: 6834 [HDQQI:2979; Chest Tube:140]  Intake/Output this shift:    Current Meds: Scheduled Meds: . bisacodyl  10 mg Oral Daily  . enoxaparin (LOVENOX) injection  30 mg Subcutaneous Q24H  . influenza vac split quadrivalent PF  0.5 mL Intramuscular Tomorrow-1000  . metoprolol tartrate  25 mg Oral BID  . pneumococcal 23 valent vaccine  0.5 mL Intramuscular Tomorrow-1000   Continuous Infusions: . bupivacaine ON-Q pain pump    . dextrose 5 % and 0.45 % NaCl with KCl 20 mEq/L 50 mL/hr at 02/03/14 0600  . lactated ringers Stopped (01/31/14 1859)   PRN Meds:.dextromethorphan-guaiFENesin, metoprolol, ondansetron (ZOFRAN) IV, oxyCODONE-acetaminophen, potassium chloride, senna-docusate, traMADol  General appearance: alert, cooperative and no distress Heart: regular rate and rhythm Lungs: dim in bases Abdomen: benign Extremities: warm Wound: incisions healing well  Lab Results: CBC: Recent Labs  02/01/14 0445 02/02/14 0430  WBC 8.7 9.8  HGB  12.8* 13.7  HCT 37.0* 40.3  PLT 158 173   BMET:  Recent Labs  02/01/14 0445 02/02/14 0430  NA 140 138  K 4.1 3.9  CL 104 98  CO2 25 27  GLUCOSE 145* 119*  BUN 11 8  CREATININE 0.84 0.77  CALCIUM 8.3* 9.1    PT/INR: No results found for this basename: LABPROT, INR,  in the last 72 hours Radiology: Dg Chest Port 1 View  02/02/2014   CLINICAL DATA:  Chest tube  EXAM: PORTABLE CHEST - 1 VIEW  COMPARISON:  02/01/2014  FINDINGS: Two chest tubes on the right are unchanged. No pneumothorax identified.  Right jugular catheter tip in the lower SVC.  Increased atelectasis in the lung bases.  No significant effusion.  IMPRESSION: Increase in bibasilar atelectasis.  Negative for pneumothorax   Electronically Signed   By: Franchot Gallo M.D.   On: 02/02/2014 08:15   Chest tube: no air leak.140 cc drainage yesterday     Assessment/Plan: S/P Procedure(s) (LRB): VIDEO BRONCHOSCOPY (N/A) VIDEO ASSISTED THORACOSCOPY (VATS)/WEDGE RESECTION; with insertion of On Q pain pump (Right) LYMPH NODE DISSECTION (Right)  1 doing well 2 d/c chest tube, central line 3 no new labs 4 push pulm toilet/rehab 5 poss d/c 1-2 days    GOLD,[name] 02/03/2014 7:48 AM  Dermatitis/c remaining chest tube Waiting for 3s bed, will transfer to 2w I have seen and examined  Leia Alf and agree with the above assessment  and plan.  [name] MD Beeper [phone] Office [phone] 02/03/2014 8:45 AM

## 2014-02-03 NOTE — Discharge Instructions (Signed)
Thoracoscopy Care After Refer to this sheet in the next few weeks. These discharge instructions provide you with general information on caring for yourself after you leave the hospital. Your caregiver may also give you specific instructions. Your treatment has been planned according to the most current medical practices available, but unavoidable complications sometimes occur. If you have any problems or questions after discharge, call your caregiver. HOME CARE INSTRUCTIONS   Remove the bandage (dressing) over your chest tube site as directed by your caregiver.  It is normal to be sore for a couple weeks following surgery. See your caregiver if this seems to be getting worse rather than better.  Only take over-the-counter or prescription medicines for pain, discomfort, or fever as directed by your caregiver. It is very important to take pain medicine when you need it so that you will cough and breathe deeply enough to clear mucus (phlegm) and expand your lungs.  If it hurts to cough, hold a pillow against your chest when you cough. This may help with the discomfort. In spite of the discomfort, cough frequently, as this helps protect against getting an infection in your lung (pneumonia).  Taking deep breaths keeps lungs inflated and protects against pneumonia. Most patients will go home with an incentive spirometer that encourages deep breathing.  You may resume a normal diet and activities as directed.  Use showers for bathing until you see your caregiver, or as instructed.  Change dressings if necessary or as directed.  Avoid lifting or driving until you are instructed otherwise.  Make an appointment to see your caregiver for stitch (suture) or staple removal when instructed.  Do not travel by airplane for 2 weeks after the chest tube is removed. SEEK MEDICAL CARE IF:   You are bleeding from your wounds.  You have redness, swelling, or increasing pain in the wounds.  Your heartbeat  feels irregular or very fast.  There is pus coming from your wounds.  There is a bad smell coming from the wound or dressing. SEEK IMMEDIATE MEDICAL CARE IF:   You have a fever.  You develop a rash.  You have difficulty breathing.  You develop any reaction or side effects to medicines given.  You develop lightheadedness or feel faint.  You develop shortness of breath or chest pain. MAKE SURE YOU:   Understand these instructions.  Will watch your condition.  Will get help right away if you are not doing well or get worse. Document Released: 05/24/2005 Document Revised: 01/27/2012 Document Reviewed: 04/24/2011 [facility] Patient Information 2014 [place], [place].

## 2014-02-03 NOTE — Discharge Summary (Signed)
[address]       [place] [address]             [place] 06-27-1961 53 y.o. [id number]  01/31/2014   [name], MD  Squamous cell carcinoma   History of Present Illness: At time of admission Bruce [name] 53 y.o. male underwent total esophagectomy with cervical esophagogastrostomy October 2013 after a course of preoperative radiation and chemotherapy for squamous cell carcinoma of the esophagus. On followup scans the patient was noted to have an increasing right lung lesion. Needle biopsy several weeks ago demonstrated squamous cell carcinoma, from a pathologic standpoint it's unclear if this is a new second primary starting in the lung or a metastatic deposit. There possibly of N1 nodes involved on the right by PET scan. There is no other activity on PET scan to suggest metastatic disease.  The patient has been a smoker in the past but quit smoking in 2008.  He was admitted this hospitalization for the assisted thoracoscopy for resection.   Past Medical History   Diagnosis  Date   .  Esophagitis    .  Mass of esophagus  05/28/2012     BX'D DISTAL ESOPHAGUS,PENDING   .  Dysphasia      solid and liquid   .  Neuropathy      right hand numbness 1 year 2012   .  Cancer  05/28/12     bx=esophagus=squamous cell carcinoma   .  Lung cancer  12/27/13     right upper lung    Past Surgical History   Procedure  Laterality  Date   .  Esophagogastroduodenoscopy   05/28/12     with biopsy mass distal esophagus ending ge junction =invasiver squamous cell ca   .  Eus   06/11/2012     Procedure: UPPER ENDOSCOPIC ULTRASOUND (EUS) RADIAL; Surgeon: Milus Banister, MD; Location: [facility]; Service: Endoscopy; Laterality: N/A;   .  Complete esophagectomy   08/26/2012     Procedure: ESOPHAGECTOMY COMPLETE; Surgeon: [name], MD; Location: [facility]; Service: Thoracic; Laterality: N/A; transhiatal   .  Pyloroplasty   08/26/2012    Procedure: PYLOROPLASTY; Surgeon: [name], MD; Location: [facility]; Service: Thoracic; Laterality: N/A;   .  Video bronchoscopy   08/26/2012     Procedure: VIDEO BRONCHOSCOPY; Surgeon: [name], MD; Location: [facility]; Service: Thoracic; Laterality: N/A;   .  Jejunostomy   08/26/2012     Procedure: JEJUNOSTOMY; Surgeon: [name], MD; Location: [facility] OR; Service: Thoracic; Laterality: N/A; placement of feeding jujunostomy tube    Family History   Problem  Relation  Age of Onset   .  Breast cancer  Sister     History    Social History   .  Marital Status:  Married     Spouse Name:  N/A     Number of Children:  N/A   .  Years of Education:  N/A    Occupational History   .  welder     Social History Main Topics   .  Smoking status:  Former Smoker -- 0.40 packs/day for 25 years     Types:  Cigarettes     Quit date:  06/19/2011   .  Smokeless tobacco:  Former Systems developer     Quit date:  06/03/2007   .  Alcohol Use:  No  Comment: not currently (01/28/14)   .  Drug Use:  No   .  Sexual Activity:  Not on file    Other Topics  Concern   .  Not on file    Social History Narrative   .  No narrative on file    History   Smoking status   .  Former Smoker -- 0.40 packs/day for 25 years   .  Types:  Cigarettes   .  Quit date:  06/19/2011   Smokeless tobacco   .  Former Systems developer   .  Quit date:  06/03/2007    History   Alcohol Use  No      Comment: not currently (01/28/14)    No Known Allergies     Hospital Course:  The patient was admitted and on 02/01/2014 was taken to the operating room at which time he underwent the following procedure: DATE OF PROCEDURE: 01/31/2014  OPERATIVE REPORT  PREOPERATIVE DIAGNOSES: Right upper lobe lung mass, squamous cell  carcinoma with hypermetabolic hilar node, status post esophagectomy  2013, for squamous cell carcinoma of the esophagus.  POSTOPERATIVE DIAGNOSES: Right upper lobe lung mass, squamous cell  carcinoma with  hypermetabolic hilar node, status post esophagectomy  2013, for squamous cell carcinoma of the esophagus.  PROCEDURE: Video Bronchoscopy, Right VATS, mini-thoracotomy, Mediastinal Lynch Node sampling and wedge resection of mass right Upper lobe, Placement of ON Q device  Surgeon: [name] MD  Assistant: Suzzanne Cloud PA The patient tolerated the procedure without  obvious complication.  Postoperative hospital course:  Overall the patient has done quite well. He is maintained stable hemodynamics. Chest tubes have been discontinued in the standard stepwise fashion. All of the lines monitors have been discontinued in the usual manner. He is tolerating gradually increasing activities using standard postoperative. Incision is healing well without evidence of infection. He is tolerating advancing diet. Oxygen has been weaned and he maintains good saturations. Pathology has revealed the following diagnoses:  Diagnosis 1. Lymph node, biopsy, 4 R - THERE IS NO EVIDENCE OF CARCINOMA IN 1 OF 1 LYMPH NODE (0/1). 2. Lung, wedge biopsy/resection, Right upper lobe - SQUAMOUS CELL CARCINOMA, 2.1 CM. - LYMPHOVASCULAR INVASION IS IDENTIFIED. - THE SURGICAL RESECTION MARGINS ARE NEGATIVE FOR CARCINOMA. - SEE COMMENT. 3. Lymph node, biopsy, Right paraesophageal in the area of the ascus vein 3 P - METASTATIC SQUAMOUS CELL CARCINOMA IN 1 OF 1 LYMPH NODE (1/1). 4. Lung, biopsy, Right intralobar - THERE IS NO EVIDENCE OF CARCINOMA IN 1 OF 1 LYMPH NODE (0/1). Currently the patient's status is felt to be tentatively stable for discharge in the next 24-48 hours pending ongoing reevaluation of his recovery.   Recent Labs  02/01/14 0445 02/02/14 0430  NA 140 138  K 4.1 3.9  CL 104 98  CO2 25 27  GLUCOSE 145* 119*  BUN 11 8  CALCIUM 8.3* 9.1    Recent Labs  02/01/14 0445 02/02/14 0430  WBC 8.7 9.8  HGB 12.8* 13.7  HCT 37.0* 40.3  PLT 158 173   No results found for this basename: INR,  in  the last 72 hours   Discharge Instructions:  The patient is discharged to home with extensive instructions on wound care and progressive ambulation.  They are instructed not to drive or perform any heavy lifting until returning to see the physician in his office.  Discharge Diagnosis:  Squamous cell carcinoma   Secondary Diagnosis: Patient Active Problem List   Diagnosis Date  Noted  . Squamous cell carcinoma 01/31/2014  . Malignant neoplasm of upper lobe, bronchus or lung 01/18/2014  . Malignant neoplasm of lower third of esophagus 06/06/2012   Past Medical History  Diagnosis Date  . Esophagitis   . Mass of esophagus 05/28/2012    BX'D DISTAL ESOPHAGUS,PENDING  . Dysphasia     solid and liquid  . Neuropathy     right hand numbness 1 year 2012  . 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 Lung cancer 12/27/13    right upper lung    Medications on discharge:   Medication List         metoprolol tartrate 25 MG tablet  Commonly known as:  LOPRESSOR  Take 1 tablet (25 mg total) by mouth 2 (two) times daily.     oxyCODONE-acetaminophen 5-325 MG per tablet  Commonly known as:  PERCOCET/ROXICET  Take 1-2 tablets by mouth every 4 (four) hours as needed for severe pain.     pantoprazole 40 MG tablet  Commonly known as:  PROTONIX  Take 1 tablet (40 mg total) by mouth daily.          Jadene Pierini, [place] 02/03/2014  8:39 AM

## 2014-02-04 ENCOUNTER — Inpatient Hospital Stay (HOSPITAL_COMMUNITY): Payer: BC Managed Care – PPO

## 2014-02-04 LAB — CBC
HCT: 37.3 % — ABNORMAL LOW (ref 39.0–52.0)
Hemoglobin: 12.9 g/dL — ABNORMAL LOW (ref 13.0–17.0)
MCH: 30.7 pg (ref 26.0–34.0)
MCHC: 34.6 g/dL (ref 30.0–36.0)
MCV: 88.8 fL (ref 78.0–100.0)
Platelets: 183 10*3/uL (ref 150–400)
RBC: 4.2 MIL/uL — ABNORMAL LOW (ref 4.22–5.81)
RDW: 13 % (ref 11.5–15.5)
WBC: 6.6 10*3/uL (ref 4.0–10.5)

## 2014-02-04 LAB — BASIC METABOLIC PANEL
BUN: 12 mg/dL (ref 6–23)
CO2: 26 mEq/L (ref 19–32)
Calcium: 8.7 mg/dL (ref 8.4–10.5)
Chloride: 101 mEq/L (ref 96–112)
Creatinine, Ser: 0.87 mg/dL (ref 0.50–1.35)
GFR calc Af Amer: >90 mL/min (ref >90)
GFR calc non Af Amer: >90 mL/min (ref >90)
Glucose, Bld: 115 mg/dL — ABNORMAL HIGH (ref 70–99)
Potassium: 4 mEq/L (ref 3.7–5.3)
Sodium: 138 mEq/L (ref 137–147)

## 2014-02-04 MED ORDER — OXYCODONE-ACETAMINOPHEN 5-325 MG PO TABS: 1.0000 | ORAL_TABLET | ORAL | Status: DC | PRN

## 2014-02-04 MED ORDER — MAGNESIUM HYDROXIDE 400 MG/5ML PO SUSP: 15.0000 mL | Freq: Once | ORAL | Status: DC

## 2014-02-04 MED ORDER — METOPROLOL TARTRATE 25 MG PO TABS: 25.0000 mg | ORAL_TABLET | Freq: Two times a day (BID) | ORAL | Status: DC

## 2014-02-04 NOTE — Care Management Note (Signed)
    Page 1 of 1   02/04/2014     2:40:45 PM   CARE MANAGEMENT NOTE 02/04/2014  Patient:  Bruce [name], Bruce [name]   Account Number:  [id number]  Date Initiated:  02/04/2014  Documentation initiated by:  Esdras Delair  Subjective/Objective Assessment:   PT ADM ON 3/16 S/P RT VATS /MINI THORACOTOMY.  PTA, PT INDEPENDENT, LIVES WITH DAUGHTER.     Action/Plan:   PT HAS PROGRESSED WELL; NO DC NEEDS IDENTIFIED.   Anticipated DC Date:  02/04/2014   Anticipated DC Plan:  [place]  CM consult      Choice offered to / List presented to:             Status of service:  Completed, signed off Medicare Important Message given?   (If response is "NO", the following Medicare IM given date fields will be blank) Date Medicare IM given:   Date Additional Medicare IM given:    Discharge Disposition:  HOME/SELF CARE  Per UR Regulation:  Reviewed for med. necessity/level of care/duration of stay  If discussed at [place] of Stay Meetings, dates discussed:    Comments:

## 2014-02-04 NOTE — Progress Notes (Signed)
Discharged to home with family office visits in place teaching done  

## 2014-02-04 NOTE — Progress Notes (Addendum)
       [address]       [place] [id number]             [phone]          4 Days Post-Op Procedure(s) (LRB): VIDEO BRONCHOSCOPY (N/A) VIDEO ASSISTED THORACOSCOPY (VATS)/WEDGE RESECTION; with insertion of On Q pain pump (Right) LYMPH NODE DISSECTION (Right)  Subjective: Feels well, no coughing this am. Eating well, walking in halls.   Objective: Vital signs in last 24 hours: Patient Vitals for the past 24 hrs:  BP Temp Temp src Pulse Resp SpO2  02/04/14 0431 92/60 mmHg 98.4 F (36.9 C) Oral 67 18 95 %  02/03/14 2333 90/60 mmHg - - 72 20 -  02/03/14 1937 110/64 mmHg 99.4 F (37.4 C) Oral 74 20 96 %  02/03/14 1406 121/68 mmHg 98.1 F (36.7 C) Oral 76 16 98 %  02/03/14 1207 - 98.5 F (36.9 C) Oral - - -  02/03/14 1200 134/77 mmHg - - 76 11 96 %  02/03/14 1100 119/72 mmHg - - 72 13 93 %  02/03/14 1000 152/91 mmHg - - 90 11 96 %  02/03/14 0900 148/94 mmHg - - 85 15 99 %  02/03/14 0800 - - - 98 22 95 %   Current Weight  02/01/14 128 lb 4.9 oz (58.2 kg)     Intake/Output from previous day: 03/19 0701 - 03/20 0700 In: 580 [P.O.:480; I.V.:100] Out: 2450 [Urine:2450]    PHYSICAL EXAM:  Heart: RRR Lungs: Clear Wound: Clean and dry    Lab Results: CBC: Recent Labs  02/02/14 0430 02/04/14 0437  WBC 9.8 6.6  HGB 13.7 12.9*  HCT 40.3 37.3*  PLT 173 183   BMET:  Recent Labs  02/02/14 0430 02/04/14 0437  NA 138 138  K 3.9 4.0  CL 98 101  CO2 27 26  GLUCOSE 119* 115*  BUN 8 12  CREATININE 0.77 0.87  CALCIUM 9.1 8.7    PT/INR: No results found for this basename: LABPROT, INR,  in the last 72 hours  CXR: stable, ?small apical ptx  Assessment/Plan: S/P Procedure(s) (LRB): VIDEO BRONCHOSCOPY (N/A) VIDEO ASSISTED THORACOSCOPY (VATS)/WEDGE RESECTION; with insertion of On Q pain pump (Right) LYMPH NODE DISSECTION (Right) Doing well.  Off oxygen, CXR stable. Plan d/c home today- instructions reviewed with patient and  daughter.   LOS: 4 days    COLLINS,GINA H 02/04/2014  Doing well postop Home today Bowels not  moved yet will give mom On stool softner I have seen and examined Leia Alf and agree with the above assessment  and plan.  [name] MD [place] [phone] Office [phone] 02/04/2014 9:39 AM

## 2014-02-09 ENCOUNTER — Ambulatory Visit (INDEPENDENT_AMBULATORY_CARE_PROVIDER_SITE_OTHER): Payer: BC Managed Care – PPO

## 2014-02-09 DIAGNOSIS — D381 Neoplasm of uncertain behavior of trachea, bronchus and lung: Secondary | ICD-10-CM

## 2014-02-09 DIAGNOSIS — Z4802 Encounter for removal of sutures: Secondary | ICD-10-CM

## 2014-02-09 NOTE — Progress Notes (Signed)
Removed 2 chest tube sutures with no signs of infection and patient tolerated well.

## 2014-02-14 ENCOUNTER — Other Ambulatory Visit: Payer: Self-pay | Admitting: *Deleted

## 2014-02-14 DIAGNOSIS — R918 Other nonspecific abnormal finding of lung field: Secondary | ICD-10-CM

## 2014-02-17 ENCOUNTER — Ambulatory Visit
Admission: RE | Admit: 2014-02-17 | Discharge: 2014-02-17 | Disposition: A | Discharge status: Home / Self Care | Payer: BC Managed Care – PPO | Source: Ambulatory Visit | Attending: Cardiothoracic Surgery | Admitting: Cardiothoracic Surgery

## 2014-02-17 ENCOUNTER — Ambulatory Visit (INDEPENDENT_AMBULATORY_CARE_PROVIDER_SITE_OTHER): Payer: BC Managed Care – PPO | Admitting: Cardiothoracic Surgery

## 2014-02-17 ENCOUNTER — Encounter: Payer: Self-pay | Admitting: Cardiothoracic Surgery

## 2014-02-17 VITALS — BP 112/71 | HR 87 | Resp 20 | Ht 63.0 in | Wt 130.0 lb

## 2014-02-17 DIAGNOSIS — C159 Malignant neoplasm of esophagus, unspecified: Secondary | ICD-10-CM

## 2014-02-17 DIAGNOSIS — Z9049 Acquired absence of other specified parts of digestive tract: Secondary | ICD-10-CM

## 2014-02-17 DIAGNOSIS — Z9889 Other specified postprocedural states: Secondary | ICD-10-CM

## 2014-02-17 DIAGNOSIS — Z09 Encounter for follow-up examination after completed treatment for conditions other than malignant neoplasm: Secondary | ICD-10-CM

## 2014-02-17 DIAGNOSIS — C3490 Malignant neoplasm of unspecified part of unspecified bronchus or lung: Secondary | ICD-10-CM

## 2014-02-17 DIAGNOSIS — R918 Other nonspecific abnormal finding of lung field: Secondary | ICD-10-CM

## 2014-02-17 NOTE — Progress Notes (Signed)
[place] Record #[id number] Date of Birth: [birth year]  Referring: Ladell Pier, MD Primary Care: No PCP Per Patient  Chief Complaint:    Chief Complaint  Patient presents with  . Routine Post Op    2 week f/u  from surgery with CXR, S/P Bronch, RT VATS, mini thoracotomy, wedge resection of right upper lobe mass on 01/31/14    History of Present Illness:    Bruce [name] 53 y.o. male  underwent total esophagectomy with cervical esophagogastrostomy October 2013 after a course of preoperative radiation and chemotherapy for squamous cell carcinoma of the esophagus. On followup scans the patient was noted to have an increasing right lung lesion. Needle biopsy several weeks ago demonstrated squamous cell carcinoma, from a pathologic standpoint it's unclear if this is a new second primary starting in the lung or a metastatic deposit.   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 The patient has done very well postoperatively returning to normal activities much greater than his previous esophageal resection.    Current Activity/ Functional Status:  Patient is independent with mobility/ambulation, transfers, ADL's, IADL's.   Zubrod Score: At the time of surgery this patient's most appropriate activity status/level should be described as: []     0    Normal activity, no symptoms [x]     1    Restricted in physical strenuous activity but ambulatory, able to do out light work []     2    Ambulatory and capable of self care, unable to do work activities, up and about               >50 % of waking hours                              []     3    Only limited self care, in bed greater than 50% of waking hours []     4    Completely disabled, no self care, confined to bed or chair []     5    Moribund   Past Medical History    Diagnosis Date  . Esophagitis   . Mass of esophagus 05/28/2012    BX'D DISTAL ESOPHAGUS,PENDING  . Dysphasia     solid and liquid  . Neuropathy     right hand numbness 1 year 2012  . 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 Lung cancer 12/27/13    right upper lung    Past Surgical History  Procedure Laterality Date  . 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 Eus  06/11/2012    Procedure: UPPER ENDOSCOPIC ULTRASOUND (EUS) RADIAL;  Surgeon: Milus Banister, MD;  Location: [facility];  Service: Endoscopy;  Laterality: N/A;  . Complete esophagectomy  08/26/2012    Procedure: ESOPHAGECTOMY COMPLETE;  Surgeon: [name], MD;  Location: [place];  Service: Thoracic;  Laterality: N/A;  transhiatal   . Pyloroplasty  08/26/2012    Procedure: PYLOROPLASTY;  Surgeon: [name], MD;  Location: [place];  Service: Thoracic;  Laterality: N/A;  . Video bronchoscopy  08/26/2012    Procedure: VIDEO BRONCHOSCOPY;  Surgeon: [name], MD;  Location: [facility];  Service: Thoracic;  Laterality: N/A;  . Jejunostomy  08/26/2012    Procedure: JEJUNOSTOMY;  Surgeon: [name], MD;  Location: [facility];  Service: Thoracic;  Laterality: N/A;  placement of feeding jujunostomy tube  . Video bronchoscopy N/A 01/31/2014    Procedure: VIDEO BRONCHOSCOPY;  Surgeon: [name], MD;  Location: [facility] OR;  Service: Thoracic;  Laterality: N/A;  . Video assisted thoracoscopy (vats)/wedge resection Right 01/31/2014    Procedure: VIDEO ASSISTED THORACOSCOPY (VATS)/WEDGE RESECTION; with insertion of On Q pain pump;  Surgeon: [name], MD;  Location: [place];  Service: Thoracic;  Laterality: Right;  with insertion of On Q pain pump  . Lymph node dissection Right 01/31/2014    Procedure: LYMPH NODE DISSECTION;  Surgeon: [name], MD;  Location: [facility] OR;  Service: Thoracic;  Laterality: Right;     Family History  Problem Relation Age of Onset  . Breast cancer Sister     History   Social History  . Marital Status: Married    Spouse Name: N/A    Number of Children: N/A  . Years of Education: N/A   Occupational History  . welder    Social History Main Topics  . Smoking status: Former Smoker -- 0.40 packs/day for 25 years    Types: Cigarettes    Quit date: 06/19/2011  . Smokeless tobacco: Former Systems developer    Quit date: 06/03/2007  . Alcohol Use: No     Comment: not currently (01/28/14)  . Drug Use: No  . Sexual Activity: Not on file   Other Topics Concern  . Not on file   Social History Narrative  . No narrative on file    History  Smoking status  . Former Smoker -- 0.40 packs/day for 25 years  . Types: Cigarettes  . Quit date: 06/19/2011  Smokeless tobacco  . Former Systems developer  . Quit date: 06/03/2007    History  Alcohol Use No    Comment: not currently (01/28/14)     No Known Allergies  Current Outpatient Prescriptions  Medication Sig Dispense Refill  . metoprolol tartrate (LOPRESSOR) 25 MG tablet Take 1 tablet (25 mg total) by mouth 2 (two) times daily.  60 tablet  1  . oxyCODONE-acetaminophen (PERCOCET/ROXICET) 5-325 MG per tablet Take 1-2 tablets by mouth every 4 (four) hours as needed for severe pain.  30 tablet  0  . pantoprazole (PROTONIX) 40 MG tablet Take 1 tablet (40 mg total) by mouth daily.  30 tablet  11   No current facility-administered medications for this visit.     Review of Systems:     Cardiac Review of Systems: Y or N  Chest Pain [ n   ]  Resting SOB [ n  ] Exertional SOB  [n  ]  Orthopnea [ n ]   Pedal Edema [ n  ]    Palpitations [ n ] Syncope  [ n ]   Presyncope [  n ]  General Review of Systems: [Y] = yes [  ]=no Constitional: recent weight change [n  ];  Wt loss over the last 3 months [   ] anorexia [  ]; fatigue Blue.Reese  ]; nausea [ n ]; night sweats [ n ]; fever [ n ]; or chills [n  ];          Dental: poor dentition[  ]; Last  Dentist visit:   Eye : blurred vision [  ]; diplopia [   ]; vision changes [  ];  Amaurosis fugax[  ]; Resp: cough [  ];  wheezing[n  ];  hemoptysis[ n ]; shortness of breath[ n ]; paroxysmal nocturnal dyspnea[ n ]; dyspnea on exertion[ n ]; or orthopnea[  ];  GI:  gallstones[  ], vomiting[  ];  dysphagia[  ]; melena[  ];  hematochezia [  ]; heartburn[  ];   Hx of  Colonoscopy[  ]; GU: kidney stones [  ]; hematuria[  ];   dysuria [n  ];  nocturia[  ];  history of     obstruction [ n ]; urinary frequency [ n ]             Skin: rash, swelling[  ];, hair loss[  ];  peripheral edema[  ];  or itching[  ]; Musculosketetal: myalgias[  ];  joint swelling[  ];  joint erythema[  ];  joint pain[  ];  back pain[  ];  Heme/Lymph: bruising[  ];  bleeding[n  ];  anemia[  ];  Neuro: TIA[  ];  headaches[  ];  stroke[ n ];  vertigo[  ];  seizures[  ];   paresthesias[  ];  difficulty walking[n  ];  Psych:depression[  ]; anxiety[  ];  Endocrine: diabetes[  ];  thyroid dysfunction[  ];  Immunizations: Flu up to date Blue.Reese  ]; Pneumococcal up to date [  y];  Other:  Physical Exam: BP 112/71  Pulse 87  Resp 20  Ht 5\' 3"  (1.6 m)  Wt 130 lb (58.968 kg)  BMI 23.03 kg/m2  SpO2 99%  PHYSICAL EXAMINATION:  General appearance: alert, cooperative, appears stated age and no distress Neurologic: intact Heart: regular rate and rhythm, S1, S2 normal, no murmur, click, rub or gallop Lungs: clear to auscultation bilaterally Abdomen: soft, non-tender; bowel sounds normal; no masses,  no organomegaly Extremities: extremities normal, atraumatic, no cyanosis or edema and Homans sign is negative, no sign of DVT Wound: Patient's incisions in the left neck and mid abdomen are well-healed, he has bilateral chest tube sites well healed from chest tubes placed at the time of his esophagectomy The recent knee thoracotomy/ VATS resection is well-healed without evidence of infection Patient has no cervical or supraclavicular or  axillary adenopathy.  Diagnostic Studies & Laboratory data:     Recent Radiology Findings:   Dg Chest 2 View  02/17/2014   CLINICAL DATA:  Lung mass  EXAM: CHEST  2 VIEW  COMPARISON:  02/04/2014  FINDINGS: Right pneumothorax has resolved since the prior study. There remains right apical pleural thickening. No significant effusion.  Mild right upper lobe airspace disease. Right hilar adenopathy noted.  IMPRESSION: Right apical pneumothorax has resolved.  Right hilar adenopathy and minimal right midlung airspace disease is unchanged.   Electronically Signed   By: Franchot Gallo M.D.   On: 02/17/2014 09:21    Dg Chest 1 View  12/27/2013   CLINICAL DATA:  Post right upper lobe nodule biopsy, no chest pain or shortness of breath.  EXAM: CHEST - 1 VIEW  COMPARISON:  CT BIOPSY dated 12/27/2013; CT CHEST W/CM dated 12/16/2013; DG CHEST 2 VIEW dated 03/04/2013  FINDINGS: Grossly unchanged borderline enlarged cardiac silhouette and mediastinal contours. Known centrally cavitary nodule within the right upper lobe is not well demonstrated on the present examination. No pleural effusion or pneumothorax following right upper lobe lung nodule biopsy. No evidence of edema. Unchanged bones.  IMPRESSION: No evidence of complication following right upper lung nodule biopsy.   Electronically Signed  By: Sandi Mariscal M.D.   On: 12/27/2013 12:01   Nm Pet Image Restag (ps) Skull Base To Thigh  01/11/2014   CLINICAL DATA:  Subsequent treatment strategy for esophageal cancer. Recent biopsy of a cavitary right upper lobe lung lesion revealed invasive squamous cell carcinoma.  EXAM: NUCLEAR MEDICINE PET SKULL BASE TO THIGH  FASTING BLOOD GLUCOSE:  Value: 92 mg/dl  TECHNIQUE: 6.3 mCi F-18 FDG was injected intravenously. Full-ring PET imaging was performed from the skull base to thigh after the radiotracer. CT data was obtained and used for attenuation correction and anatomic localization.  COMPARISON:  CT BIOPSY dated 12/27/2013   FINDINGS: NECK  No hypermetabolic lymph nodes in the neck.  CHEST  Gastric pull-through noted. Cavitary posterior right upper lobe mass, 2.1 x 1.5 cm, maximum standard uptake value 7.7. Posterior left infrahilar lymph node, 2.3 x 2.0 cm, maximum standard uptake value 9.8. Adjacent indistinct infrahilar node has maximum standard uptake value of 5.9.  A vascular structure or small lymph node posterior to the descending thoracic aorta has a maximum standard uptake value of 3.4 and is probably incidental.  ABDOMEN/PELVIS  Mid transverse colon has some faintly increased signal but is also small in caliber, unless this increased signal (standard uptake value 6.1) is probably physiologic. Mildly prominent but gas-filled appendix does not appear actively inflamed. It is  SKELETON  No focal hypermetabolic activity to suggest skeletal metastasis.  IMPRESSION: 1. Hypermetabolic cavitary right upper lobe mass corresponding to the known invasive squamous cell carcinoma. Pathologic right infrahilar adenopathy. 2. Faintly increased activity in the mid transverse colon is probably physiologic.   Electronically Signed   By: Sherryl Barters M.D.   On: 01/11/2014 11:46   Ct Biopsy  12/27/2013   INDICATION: History of esophageal cancer, now with enlarging centrally necrotic nodule within the right upper lobe. Please obtain biopsy to evaluate for either metastatic disease versus primary lung cancer.  EXAM: CT BIOPSY  MEDICATIONS: Fentanyl 75 mcg IV; Versed 1.5 mg IV  ANESTHESIA/SEDATION: Sedation time  20 minutes  CONTRAST:  None  COMPARISON:  NM PET IMAGE INITIAL (PI) SKULL BASE TO THIGH dated 06/11/2012; CT CHEST W/CM dated 12/16/2013; CT CHEST W/CM dated 06/15/2013  PROCEDURE: Informed consent was obtained from the patient (via the use of a medical translator) following an explanation of the procedure, risks, benefits and alternatives. The patient understands,agrees and consents for the procedure. All questions were addressed. A time  out was performed prior to the initiation of the procedure.  The patient was positioned right lateral decubitus on the CT table and a limited chest CT was performed for procedural planning demonstrating no change to minimal increase in size of the now approximately 1.7 x 1.4 cm nodule within the right upper lobe (image 5, series 3). Note, there has been interval increased central cavitation of this nodule. The operative site was prepped and draped in the usual sterile fashion. Under sterile conditions and local anesthesia, a 22 gauge spinal needle was utilized for procedural planning. This was followed by the placement of a 17 gauge coaxial needle into the peripheral caudal aspect of the nodule. Positioning was confirmed with intermittent CT fluoroscopy and followed by the acquisition of 2 core needle biopsies with an 18 gauge core needle biopsy device.  Limited post procedural chest CT was negative for pneumothorax or additional complication. The co-axial needle was removed and hemostasis was achieved with manual compression. A dressing was placed. The patient tolerated the procedure well without immediate postprocedural complication.  The patient was escorted to have an upright chest radiograph.  COMPLICATIONS: None immediate.  IMPRESSION: Technically successful CT guided core needle core biopsy of enlarging centrally necrotic nodule within the right upper lobe.   Electronically Signed   By: Sandi Mariscal M.D.   On: 12/27/2013 13:19      Recent Lab Findings: Lab Results  Component Value Date   WBC 6.6 02/04/2014   HGB 12.9* 02/04/2014   HCT 37.3* 02/04/2014   PLT 183 02/04/2014   GLUCOSE 115* 02/04/2014   ALT 22 02/02/2014   AST 34 02/02/2014   NA 138 02/04/2014   K 4.0 02/04/2014   CL 101 02/04/2014   CREATININE 0.87 02/04/2014   BUN 12 02/04/2014   CO2 26 02/04/2014   INR 0.90 01/28/2014      Assessment / Plan:   Patient is stable after recent wedge resection of right lung metastasis from squamous cell  carcinoma and node dissection. He's made good progress postoperatively We'll refer him back to oncology to consider what further treatment to pursue. Plan to see him back in 3 months   [name] MD      [address] [place] [address] Office [phone]   Beeper [phone]  02/17/2014 5:19 PM

## 2014-03-08 ENCOUNTER — Ambulatory Visit (INDEPENDENT_AMBULATORY_CARE_PROVIDER_SITE_OTHER): Payer: BC Managed Care – PPO | Admitting: Family Medicine

## 2014-03-08 ENCOUNTER — Ambulatory Visit: Payer: Self-pay | Admitting: Family Medicine

## 2014-03-08 VITALS — BP 112/66 | HR 84 | Temp 98.3°F | Resp 16 | Ht 63.5 in | Wt 126.2 lb

## 2014-03-08 DIAGNOSIS — R300 Dysuria: Secondary | ICD-10-CM

## 2014-03-08 DIAGNOSIS — N390 Urinary tract infection, site not specified: Secondary | ICD-10-CM

## 2014-03-08 LAB — POCT URINALYSIS DIPSTICK
Bilirubin, UA: NEGATIVE
Glucose, UA: NEGATIVE
Ketones, UA: NEGATIVE
Nitrite, UA: POSITIVE
Protein, UA: NEGATIVE
Spec Grav, UA: 1.01
Urobilinogen, UA: 0.2
pH, UA: 6.5

## 2014-03-08 LAB — POCT UA - MICROSCOPIC ONLY
Bacteria, U Microscopic: NEGATIVE
Casts, Ur, LPF, POC: NEGATIVE
Crystals, Ur, HPF, POC: NEGATIVE
Mucus, UA: NEGATIVE
Yeast, UA: NEGATIVE

## 2014-03-08 MED ORDER — CIPROFLOXACIN HCL 500 MG PO TABS: 500.0000 mg | ORAL_TABLET | Freq: Two times a day (BID) | ORAL | Status: DC

## 2014-03-08 NOTE — Patient Instructions (Signed)
Nhi?m Trng ???ng Ti?t Ni?u (Urinary Tract Infection) Nhi?m trng ???ng ti?t ni?u (UTI) c th? pht tri?n ?? b?t c? vi? tri? na?o d?c ???ng ti?t ni?u. ???ng ti?t ni?u l h? th?ng thot n??c c?a c? th? ?? lo?i b? ch?t th?i v n??c d? th?a. ???ng ti?t ni?u bao g?m hai th?n, ni?u qu?n, bng quang v ni?u ??o. Th?n l m?t c?p c? quan hnh h?t ??u. M?i th?n co? kch th??c kho?ng b?ng bn tay c?a b?n. Chng n?m d??i x??ng s??n, m?i bn c?t s?ng c m?t qu?[facility] Nhi?m trng gy b?i vi sinh v?t, l cc sinh v?t nh?, bao g?m n?m, vi rt v vi khu?n. Nh?ng sinh v?t ny nh? t?i m?c chng ch? c th? ???c nhn th?y qua knh hi?n vi. Vi khu?n l nh?ng vi sinh v?t ph? bi?n nh?t gy ra UTI. TRI?U CH?NG Cc tri?u ch?ng c?a UTI c th? khc nhau, ty theo ?? tu?i v gi?i tnh c?a b?nh nhn v b?i v? tr nhi?m trng. Cc tri?u ch?ng ? ph? n? tr? th??ng bao g?m nhu c?u ti?u ti?n th??ng xuyn v d? d?i, c?m gic ?au v rt ? bng quang ho?c ni?u ??o khi ?i ti?u. Ph? n? v nam gi?i l?n tu?i c nhi?u kh? n?ng b? m?t m?i, run r?y v y?u, ??ng th?i b? ?au c? v ?au b?ng. S?t c th? c ngh?a l nhi?m trng ? th?n. Cc tri?u ch?ng khc c?a nhi?m trng th?n bao g?m ?au ? l?ng ho?c hai bn d??i x??ng s??n, bu?n nn v nn m?a. CH?N ?ON ?? ch?n ?on UTI, chuyn gia ch?m [place] s?c kh?e s? h?i b?n v? nh?ng tri?u ch?ng c?a b?n. Chuyn gia ch?m [place] s?c kh?e c?ng s? [place] c?u cung c?p m?u n??c ti?u. M?u n??c ti?u s? ???c xt nghi?m xem c vi khu?n v cc t? bo mu tr?ng khng. Cc t? bo mu tr?ng ???c t?o b?i c? th? ?? gip ch?ng l?i nhi?m trng. ?I?U TR? Thng th??ng, UTI c th? ???c ?i?u tr? b?ng thu?c. B?i v h?u h?t nguyn nhn gy ra UTI l do nhi?m trng b?i vi khu?n, chng th??ng c th? ???c ?i?u tr? b?ng cch s? d?ng thu?c khng sinh. Vi?c l?a ch?n khng sinh v th?i gian ?i?u tr? ph? thu?c vo tri?u ch?ng v lo?i vi khu?n gy nhi?m trng. H??NG D?N CH?M [place] T?I NH  N?u b?n ? ???c k khng sinh, hy s? d?ng chng ?ng  nh? chuyn gia ch?m [place] s?c kh?e h??ng d?n. S? d?ng h?t thu?c ngay c? khi b?n c?m th?y kh h?n sau khi ch? dng m?t ph?n thu?c.  U?ng ?? n??c v dung d?ch ?? n??c ti?u trong ho?c c mu vng nh?t.  Trnh caffeine, tr v ?? u?ng c ga. Chng c xu h??ng kch thch bng quang.  ?i ti?u th??ng xuyn. Trnh nh?n ti?u trong th?i gian di.  ?i ti?u tr??c v sau khi quan h? tnh d?c.  Sau khi ?i ??i ti?n, ph? n? c?n lm s?ch t? tr??c ra sau. Ch? s? d?ng gi?y v? sinh m?t l?n. HY ?I KHM N?U:  B?n b? ?au l?ng.  B?n b? s?t.  Cc tri?u ch?ng c?a b?n khng b?t ??u ?? trong vng 3 ngy. HY NGAY L?P T?C ?I KHM N?U:  B?n b? ?au l?ng ho?c ?au b?ng d??i nghim tr?ng.  B?n b? ?n l?nh.  B?n b? bu?n nn ho?c nn m?a.  B?n lin t?c b? rt ho?c kh ch?u khi ?i ti?u. ??M  B?O B?N:  Hi?u cc h??ng d?n ny.  S? theo di tnh tr?ng c?a mnh.  S? yu c?u tr? gip ngay l?p t?c n?u b?n c?m th?y khng ?? ho?c tnh tr?ng tr?m tr?ng h?n. Document Released: 11/04/2005 Document Revised: 07/07/2013 [facility] Patient Information 2014 [place], [place]. Urinary Tract Infection Urinary tract infections (UTIs) can develop anywhere along your urinary tract. Your urinary tract is your body's drainage system for removing wastes and extra water. Your urinary tract includes two kidneys, two ureters, a bladder, and a urethra. Your kidneys are a pair of bean-shaped organs. Each kidney is about the size of your fist. They are located below your ribs, one on each side of your spine. CAUSES Infections are caused by microbes, which are microscopic organisms, including fungi, viruses, and bacteria. These organisms are so small that they can only be seen through a microscope. Bacteria are the microbes that most commonly cause UTIs. SYMPTOMS  Symptoms of UTIs may vary by age and gender of the patient and by the location of the infection. Symptoms in young women typically include a frequent and intense urge to urinate and a  painful, burning feeling in the bladder or urethra during urination. Older women and men are more likely to be tired, shaky, and weak and have muscle aches and abdominal pain. A fever may mean the infection is in your kidneys. Other symptoms of a kidney infection include pain in your back or sides below the ribs, nausea, and vomiting. DIAGNOSIS To diagnose a UTI, your caregiver will ask you about your symptoms. Your caregiver also will ask to provide a urine sample. The urine sample will be tested for bacteria and white blood cells. White blood cells are made by your body to help fight infection. TREATMENT  Typically, UTIs can be treated with medication. Because most UTIs are caused by a bacterial infection, they usually can be treated with the use of antibiotics. The choice of antibiotic and length of treatment depend on your symptoms and the type of bacteria causing your infection. HOME CARE INSTRUCTIONS  If you were prescribed antibiotics, take them exactly as your caregiver instructs you. Finish the medication even if you feel better after you have only taken some of the medication.  Drink enough water and fluids to keep your urine clear or pale yellow.  Avoid caffeine, tea, and carbonated beverages. They tend to irritate your bladder.  Empty your bladder often. Avoid holding urine for long periods of time.  Empty your bladder before and after sexual intercourse.  After a bowel movement, women should cleanse from front to back. Use each tissue only once. SEEK MEDICAL CARE IF:   You have back pain.  You develop a fever.  Your symptoms do not begin to resolve within 3 days. SEEK IMMEDIATE MEDICAL CARE IF:   You have severe back pain or lower abdominal pain.  You develop chills.  You have nausea or vomiting.  You have continued burning or discomfort with urination. MAKE SURE YOU:   Understand these instructions.  Will watch your condition.  Will get help right away if you are  not doing well or get worse. Document Released: 08/14/2005 Document Revised: 05/05/2012 Document Reviewed: 12/13/2011 [facility] Patient Information 2014 [place].

## 2014-03-08 NOTE — Progress Notes (Addendum)
Subjective:  This chart was scribed for Robyn Haber, MD  by Stacy Gardner, [facility] Scribe. The patient was seen in room and the patient's care was started at 12:42 PM.  Patient ID: Bruce [name], male    DOB: 24-Jan-1961, 53 y.o.   MRN: [id number]  Chief Complaint  Patient presents with   Dysuria    2 weeks    Urinary Frequency  '  Dysuria  Associated symptoms include frequency. Pertinent negatives include no hematuria.  Urinary Frequency  Associated symptoms include frequency. Pertinent negatives include no hematuria.   HPI Comments: Bruce [name] is a 53 y.o. male who arrives to the [facility] complaining of dysuria for over two weeks.  Pt has  medial lumbar pain. He has urinary frequency. He states his bladder feels full and he has moderate difficulty urinating. He states he is able to urinate. Denies hematuria and abdominal pain.   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 He is currently unemployed.    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   Lung cancer 12/27/13    right upper lung   Past Surgical History  Procedure Laterality Date   Esophagogastroduodenoscopy  05/28/12    with biopsy mass distal esophagus ending ge junction  =invasiver squamous cell ca   Eus  06/11/2012    Procedure: UPPER ENDOSCOPIC ULTRASOUND (EUS) RADIAL;  Surgeon: Milus Banister, MD;  Location: [facility];  Service: Endoscopy;  Laterality: N/A;   Complete esophagectomy  08/26/2012    Procedure: ESOPHAGECTOMY COMPLETE;  Surgeon: [name], MD;  Location: [place];  Service: Thoracic;  Laterality: N/A;  transhiatal    Pyloroplasty  08/26/2012    Procedure: PYLOROPLASTY;  Surgeon: [name], MD;   Location: [place];  Service: Thoracic;  Laterality: N/A;   Video bronchoscopy  08/26/2012    Procedure: VIDEO BRONCHOSCOPY;  Surgeon: [name], MD;  Location: [place];  Service: Thoracic;  Laterality: N/A;   Jejunostomy  08/26/2012    Procedure: JEJUNOSTOMY;  Surgeon: [name], MD;  Location: [place];  Service: Thoracic;  Laterality: N/A;  placement of feeding jujunostomy tube   Video bronchoscopy N/A 01/31/2014    Procedure: VIDEO BRONCHOSCOPY;  Surgeon: [name], MD;  Location: [place];  Service: Thoracic;  Laterality: N/A;   Video assisted thoracoscopy (vats)/wedge resection Right 01/31/2014    Procedure: VIDEO ASSISTED THORACOSCOPY (VATS)/WEDGE RESECTION; with insertion of On Q pain pump;  Surgeon: [name], MD;  Location: [place];  Service: Thoracic;  Laterality: Right;  with insertion of On Q pain pump   Lymph node dissection Right 01/31/2014    Procedure: LYMPH NODE DISSECTION;  Surgeon: [name], MD;  [facility]: [facility];  Service: Thoracic;  Laterality: Right;   Current outpatient prescriptions:metoprolol tartrate (LOPRESSOR) 25 MG tablet, Take 1 tablet (25 mg total) by mouth 2 (two) times daily., Disp: 60 tablet, Rfl: 1;  oxyCODONE-acetaminophen (PERCOCET/ROXICET) 5-325 MG per tablet, Take 1-2 tablets by mouth every 4 (four) hours as needed for severe pain., Disp: 30 tablet, Rfl: 0;  pantoprazole (PROTONIX) 40 MG tablet, Take 1 tablet (40 mg total) by mouth daily., Disp:  30 tablet, Rfl: 11 No Known Allergies    Review of Systems  Genitourinary: Positive for dysuria, frequency and difficulty urinating. Negative for hematuria.  Musculoskeletal: Positive for back pain.       Objective:   Physical Exam Well-developed well-nourished alert Asian man who does not speak English but is coming by his daughter who translates for him.  HEENT: Unremarkable Neck: Supple no adenopathy Chest: Clear Heart: Regular no murmur Abdomen: Midline epigastric scar, no  HSM, mild discomfort with a sense of fullness in the suprapubic region, no masses  No CVA tenderness  Discussed course of care with pt which includes urinalysis . Pt understands and agrees. Results for orders placed in visit on 03/08/14  POCT UA - MICROSCOPIC ONLY      Result Value Ref Range   WBC, Ur, HPF, POC TNTC     RBC, urine, microscopic TNTC     Bacteria, U Microscopic neg     Mucus, UA neg     Epithelial cells, urine per micros 0-1     Crystals, Ur, HPF, POC neg     Casts, Ur, LPF, POC neg     Yeast, UA neg    POCT URINALYSIS DIPSTICK      Result Value Ref Range   Color, UA yelow     Clarity, UA clear     Glucose, UA neg     Bilirubin, UA neg     Ketones, UA neg     Spec Grav, UA 1.010     Blood, UA mod     pH, UA 6.5     Protein, UA neg     Urobilinogen, UA 0.2     Nitrite, UA positive     Leukocytes, UA large (3+)          Assessment & Plan:  Dysuria - Plan: POCT UA - Microscopic Only, POCT urinalysis dipstick, Urine culture, ciprofloxacin (CIPRO) 500 MG tablet  Urinary tract infection, site not specified - Plan: ciprofloxacin (CIPRO) 500 MG tablet  Signed, Robyn Haber, MD    Discussed course of care with pt . Pt understands and agrees.

## 2014-03-10 LAB — URINE CULTURE: Colony Count: 25000

## 2014-03-31 ENCOUNTER — Other Ambulatory Visit: Payer: Self-pay | Admitting: *Deleted

## 2014-03-31 DIAGNOSIS — C7800 Secondary malignant neoplasm of unspecified lung: Secondary | ICD-10-CM

## 2014-03-31 DIAGNOSIS — C155 Malignant neoplasm of lower third of esophagus: Secondary | ICD-10-CM

## 2014-04-01 ENCOUNTER — Ambulatory Visit (HOSPITAL_BASED_OUTPATIENT_CLINIC_OR_DEPARTMENT_OTHER): Payer: BC Managed Care – PPO | Admitting: Oncology

## 2014-04-01 ENCOUNTER — Telehealth: Payer: Self-pay | Admitting: Oncology

## 2014-04-01 ENCOUNTER — Other Ambulatory Visit (HOSPITAL_BASED_OUTPATIENT_CLINIC_OR_DEPARTMENT_OTHER): Payer: BC Managed Care – PPO

## 2014-04-01 VITALS — BP 118/78 | HR 70 | Temp 98.4°F | Resp 20 | Ht 63.5 in | Wt 129.2 lb

## 2014-04-01 DIAGNOSIS — R1310 Dysphagia, unspecified: Secondary | ICD-10-CM

## 2014-04-01 DIAGNOSIS — C7800 Secondary malignant neoplasm of unspecified lung: Secondary | ICD-10-CM

## 2014-04-01 DIAGNOSIS — C155 Malignant neoplasm of lower third of esophagus: Secondary | ICD-10-CM

## 2014-04-01 LAB — COMPREHENSIVE METABOLIC PANEL (CC13)
ALK PHOS: 116 U/L (ref 40–150)
ALT: 30 U/L (ref 0–55)
AST: 21 U/L (ref 5–34)
Albumin: 3.6 g/dL (ref 3.5–5.0)
Anion Gap: 12 mEq/L — ABNORMAL HIGH (ref 3–11)
BILIRUBIN TOTAL: 0.64 mg/dL (ref 0.20–1.20)
BUN: 10.6 mg/dL (ref 7.0–26.0)
CO2: 22 mEq/L (ref 22–29)
Calcium: 9.4 mg/dL (ref 8.4–10.4)
Chloride: 107 mEq/L (ref 98–109)
Creatinine: 0.9 mg/dL (ref 0.7–1.3)
GLUCOSE: 95 mg/dL (ref 70–140)
Potassium: 4.3 mEq/L (ref 3.5–5.1)
Sodium: 141 mEq/L (ref 136–145)
Total Protein: 7.1 g/dL (ref 6.4–8.3)

## 2014-04-01 LAB — CBC WITH DIFFERENTIAL/PLATELET
BASO%: 0.6 % (ref 0.0–2.0)
BASOS ABS: 0 10*3/uL (ref 0.0–0.1)
EOS ABS: 0.2 10*3/uL (ref 0.0–0.5)
EOS%: 3.1 % (ref 0.0–7.0)
HEMATOCRIT: 41.4 % (ref 38.4–49.9)
HEMOGLOBIN: 13.7 g/dL (ref 13.0–17.1)
LYMPH%: 29.1 % (ref 14.0–49.0)
MCH: 28.9 pg (ref 27.2–33.4)
MCHC: 33.1 g/dL (ref 32.0–36.0)
MCV: 87.2 fL (ref 79.3–98.0)
MONO#: 0.6 10*3/uL (ref 0.1–0.9)
MONO%: 8.5 % (ref 0.0–14.0)
NEUT#: 4.2 10*3/uL (ref 1.5–6.5)
NEUT%: 58.7 % (ref 39.0–75.0)
Platelets: 226 10*3/uL (ref 140–400)
RBC: 4.74 10*6/uL (ref 4.20–5.82)
RDW: 14.2 % (ref 11.0–14.6)
WBC: 7.2 10*3/uL (ref 4.0–10.3)
lymph#: 2.1 10*3/uL (ref 0.9–3.3)

## 2014-04-01 NOTE — Telephone Encounter (Signed)
Gave pt appt for Md for August am per MD

## 2014-04-01 NOTE — Progress Notes (Signed)
  [place] OFFICE PROGRESS NOTE   Diagnosis: Esophagus cancer  INTERVAL HISTORY:   He underwent resection of the right upper lung mass and lymph node sampling 01/31/2014. The pathology confirmed squamous cell carcinoma involving the right lung mass and a paraesophageal lymph node. This was felt to represent metastatic esophagus cancer.  He has recovered from surgery. No specific complaint today. Good appetite. He is swallowing well.  Objective:  Vital signs in last 24 hours:  Blood pressure 118/78, pulse 70, temperature 98.4 F (36.9 C), temperature source Oral, resp. rate 20, height 5' 3.5" (1.613 m), weight 129 lb 3.2 oz (58.605 kg), SpO2 100.00%.    HEENT: Neck without mass Lymphatics: No cervical, supraclavicular, or axillary nodes Resp: Lungs clear bilaterally Cardio: Regular rate and rhythm GI: No hepatomegaly Vascular: No leg edema   Lab Results:  Lab Results  Component Value Date   WBC 7.2 04/01/2014   HGB 13.7 04/01/2014   HCT 41.4 04/01/2014   MCV 87.2 04/01/2014   PLT 226 04/01/2014   NEUTROABS 4.2 04/01/2014    Medications: I have reviewed the patient's current medications.  Assessment/Plan: 1. 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 Staging CT scans of the chest, abdomen and pelvis on 06/02/2012 negative for evidence of metastatic disease. Endoscopic ultrasound 06/11/2012 confirmed a uT3 uN1 tumor at 40 cm from the incisors. He began radiation 06/15/2012. He began weekly Taxol/carboplatin chemotherapy 06/16/2012. He completed 6 weekly chemotherapy treatments. The carboplatin was held with week 5 due to thrombocytopenia. He completed the sixth weekly treatment with Taxol and carboplatin on 07/23/2012. He completed radiation on 07/29/2012 -he underwent an esophagectomy on 08/26/2012 with the pathology confirming a ypT3,ypN0 squamous cell carcinoma with perineural  invasion identified.  2. History of solid/liquid dysphagia secondary to the obstructing esophagus mass. He reports recurrent solid dysphagia 3. Reflux. He is taking Protonix 4. Chest CT 06/15/2013 with postoperative changes from esophagectomy and gastric pull-through. 7 mm posterior subpleural nodule in the right lower lobe. Followup CT scan 12/16/2013 revealed an increase in the size of a right hilar node and right upper lobe nodule. CT biopsy of the right upper lobe nodule on 12/27/2013 confirmed invasive squamous cell carcinoma, TTF-1 negative Staging PET scan 01/11/2014 confirmed a hypermetabolic cavitary right upper lobe mass, hypermetabolic right hilar node         Status post wedge resection of a right upper lung mass and lymph node sampling 01/31/2014, the pathology confirmed a 2.1 cm squamous cell carcinoma in the right upper lobe with lymphovascular invasion with metastatic squamous cell carcinoma in a right paraesophageal node-favored to represent metastatic esophagus cancer      Disposition:  Bruce [name] underwent resection of a right lung mass. The pathology is most consistent with metastatic squamous cell carcinoma. There is no "measurable "disease at present. There is no clear role for systemic chemotherapy in this setting. I recommend observation. He will return for an office visit and restaging CT of the chest in 3 months.  Ladell Pier, MD  04/01/2014  10:41 AM

## 2014-05-26 ENCOUNTER — Ambulatory Visit (INDEPENDENT_AMBULATORY_CARE_PROVIDER_SITE_OTHER): Payer: BC Managed Care – PPO | Admitting: Cardiothoracic Surgery

## 2014-05-26 ENCOUNTER — Other Ambulatory Visit: Payer: Self-pay | Admitting: *Deleted

## 2014-05-26 ENCOUNTER — Encounter: Payer: Self-pay | Admitting: Cardiothoracic Surgery

## 2014-05-26 ENCOUNTER — Ambulatory Visit
Admission: RE | Admit: 2014-05-26 | Discharge: 2014-05-26 | Disposition: A | Discharge status: Home / Self Care | Payer: BC Managed Care – PPO | Source: Ambulatory Visit | Attending: Cardiothoracic Surgery | Admitting: Cardiothoracic Surgery

## 2014-05-26 VITALS — BP 110/64 | HR 66 | Resp 16 | Ht 63.5 in | Wt 128.0 lb

## 2014-05-26 DIAGNOSIS — Z9049 Acquired absence of other specified parts of digestive tract: Secondary | ICD-10-CM

## 2014-05-26 DIAGNOSIS — Z09 Encounter for follow-up examination after completed treatment for conditions other than malignant neoplasm: Secondary | ICD-10-CM

## 2014-05-26 DIAGNOSIS — C7800 Secondary malignant neoplasm of unspecified lung: Secondary | ICD-10-CM

## 2014-05-26 DIAGNOSIS — C3410 Malignant neoplasm of upper lobe, unspecified bronchus or lung: Secondary | ICD-10-CM

## 2014-05-26 DIAGNOSIS — C159 Malignant neoplasm of esophagus, unspecified: Secondary | ICD-10-CM

## 2014-05-26 DIAGNOSIS — Z9889 Other specified postprocedural states: Secondary | ICD-10-CM

## 2014-05-26 NOTE — Progress Notes (Signed)
[place] Record #[id number] Date of Birth: 10-10-1961  Referring: Ladell Pier, MD Primary Care: No PCP Per Patient  Chief Complaint:    Chief Complaint  Patient presents with  . Routine Post Op    3 month f/u with cxr    History of Present Illness:    Bruce [name] 53 y.o. male  underwent total esophagectomy with cervical esophagogastrostomy October 2013 after a course of preoperative radiation and chemotherapy for squamous cell carcinoma of the esophagus. On followup scans the patient was noted to have an increasing right lung lesion.  The patient underwent  wedge resection of right lung lesion and node dissection, final pathology is suggestive of squamous cell from known metastatic from esophageal primary 3 months ago The patient has done very well postoperatively returning to normal activities much greater than his previous esophageal resection. Currently he is being observed. He has a followup CT scan of the chest in approximately one month.  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   Zubrod Score: At the time of surgery this patient's most appropriate activity status/level should be described as: []     0    Normal activity, no symptoms [x]     1    Restricted in physical strenuous activity but ambulatory, able to do out light work []     2    Ambulatory and capable of self care,  unable to do work activities, up and about               >50 % of waking hours                              []     3    Only limited self care, in bed greater than 50% of waking hours []     4    Completely disabled, no self care, confined to bed or chair []     5    Moribund   Past Medical History  Diagnosis Date  . Esophagitis   . Mass of esophagus 05/28/2012    BX'D DISTAL ESOPHAGUS,PENDING  . Dysphasia     solid and liquid  . Neuropathy     right hand numbness 1 year 2012  . 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 Lung cancer 12/27/13    right upper lung    Past Surgical History  Procedure Laterality Date  . 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 Eus  06/11/2012    Procedure: UPPER ENDOSCOPIC ULTRASOUND (EUS) RADIAL;  Surgeon: Milus Banister, MD;  Location: [facility];  Service: Endoscopy;  Laterality: N/A;  . Complete esophagectomy  08/26/2012    Procedure: ESOPHAGECTOMY COMPLETE;  Surgeon: [name], MD;  Location: [facility];  Service: Thoracic;  Laterality: N/A;  transhiatal   . Pyloroplasty  08/26/2012    Procedure: PYLOROPLASTY;  Surgeon: [name], MD;  Location: [place];  Service: Thoracic;  Laterality: N/A;  . Video bronchoscopy  08/26/2012    Procedure: VIDEO BRONCHOSCOPY;  Surgeon: [name], MD;  Location: [place];  Service: Thoracic;  Laterality: N/A;  . Jejunostomy  08/26/2012    Procedure: JEJUNOSTOMY;  Surgeon: [name], MD;  Location: [place];  Service: Thoracic;  Laterality: N/A;  placement of feeding jujunostomy tube  . Video bronchoscopy N/A 01/31/2014    Procedure: VIDEO BRONCHOSCOPY;  Surgeon: [name], MD;  Location: [facility] OR;  Service: Thoracic;  Laterality: N/A;  . Video assisted thoracoscopy (vats)/wedge resection Right 01/31/2014    Procedure: VIDEO ASSISTED THORACOSCOPY (VATS)/WEDGE RESECTION; with insertion of On Q pain pump;   Surgeon: [name], MD;  Location: [place];  Service: Thoracic;  Laterality: Right;  with insertion of On Q pain pump  . Lymph node dissection Right 01/31/2014    Procedure: LYMPH NODE DISSECTION;  Surgeon: [name], MD;  Location: [facility] OR;  Service: Thoracic;  Laterality: Right;    Family History  Problem Relation Age of Onset  . Breast cancer Sister     History   Social History  . Marital Status: Married    Spouse Name: N/A    Number of Children: N/A  . Years of Education: N/A   Occupational History  . welder    Social History Main Topics  . Smoking status: Former Smoker -- 0.40 packs/day for 25 years    Types: Cigarettes    Quit date: 06/19/2011  . Smokeless tobacco: Former Systems developer    Quit date: 06/03/2007  . Alcohol Use: No     Comment: not currently (01/28/14)  . Drug Use: No  . Sexual Activity: Not on file      History  Smoking status  . Former Smoker -- 0.40 packs/day for 25 years  . Types: Cigarettes  . Quit date: 06/19/2011  Smokeless tobacco  . Former Systems developer  . Quit date: 06/03/2007    History  Alcohol Use No    Comment: not currently (01/28/14)     No Known Allergies  Current Outpatient Prescriptions  Medication Sig Dispense Refill  . pantoprazole (PROTONIX) 40 MG tablet Take 1 tablet (40 mg total) by mouth daily.  30 tablet  11   No current facility-administered medications for this visit.     Review of Systems:     Cardiac Review of Systems: Y or N  Chest Pain [ n   ]  Resting SOB [ n  ] Exertional SOB  [n  ]  Orthopnea [ n ]   Pedal Edema [ n  ]    Palpitations [ n ] Syncope  [ n ]   Presyncope [  n ]  General Review of Systems: [Y] = yes [  ]=no Constitional: recent weight change [n  ];  Wt loss over the last 3 months [   ] anorexia [  ]; fatigue Blue.Reese  ]; nausea [ n ]; night sweats [ n ]; fever [ n ]; or chills [n  ];          Dental: poor dentition[n  ]; Last Dentist visit:   Eye : blurred vision [  ]; diplopia [   ]; vision  changes [  ];  Amaurosis fugax[  ]; Resp:  cough [  ];  wheezing[n  ];  hemoptysis[ n ]; shortness of breath[ n ]; paroxysmal nocturnal dyspnea[ n ]; dyspnea on exertion[ n ]; or orthopnea[  ];  GI:  gallstones[  ], vomiting[ n ];  dysphagia[  ]; melena[ n ];  hematochezia [n  ]; heartburn[ y ];   Hx of  Colonoscopy[  ]; GU: kidney stones [  ]; hematuria[  ];   dysuria [n  ];  nocturia[  ];  history of     obstruction [ n ]; urinary frequency [ n ]             Skin: rash, swelling[  ];, hair loss[  ];  peripheral edema[  ];  or itching[  ]; Musculosketetal: myalgias[  ];  joint swelling[  ];  joint erythema[  ];  joint pain[  ];  back pain[  ];  Heme/Lymph: bruising[  ];  bleeding[n  ];  anemia[  ];  Neuro: TIA[  ];  headaches[  ];  stroke[ n ];  vertigo[  ];  seizures[  ];   paresthesias[  ];  difficulty walking[n  ];  Psych:depression[  ]; anxiety[  ];  Endocrine: diabetes[ n ];  thyroid dysfunction[n  ];  Immunizations: Flu up to date Blue.Reese  ]; Pneumococcal up to date [  y];  Other:  Physical Exam: BP 110/64  Pulse 66  Resp 16  Ht 5' 3.5" (1.613 m)  Wt 128 lb (58.06 kg)  BMI 22.32 kg/m2  SpO2 98%  PHYSICAL EXAMINATION:  General appearance: alert, cooperative, appears stated age and no distress Neurologic: intact Heart: regular rate and rhythm, S1, S2 normal, no murmur, click, rub or gallop Lungs: clear to auscultation bilaterally Abdomen: soft, non-tender; bowel sounds normal; no masses,  no organomegaly Extremities: extremities normal, atraumatic, no cyanosis or edema and Homans sign is negative, no sign of DVT Wound: Patient's incisions in the left neck and mid abdomen are well-healed, he has bilateral chest tube sites well healed from chest tubes placed at the time of his esophagectomy The right chest incision is well-healed without evidence of infection Patient has no cervical or supraclavicular or axillary adenopathy.  Diagnostic Studies & Laboratory data:     Recent  Radiology Findings:   Dg Chest 2 View  05/26/2014   CLINICAL DATA:  Metastatic disease to lungs.  Remote history of VATS  EXAM: CHEST  2 VIEW  COMPARISON:  02/17/2014  FINDINGS: Heart is normal size. Chronic small right pleural effusion. No pneumothorax. Patchy opacities in the right mid and lower lung. Stable right hilar fullness. No confluent opacity on the left. No acute bony abnormality.  IMPRESSION: Small chronic right pleural effusion with patchy opacities throughout the right lung, similar to prior study.  Stable right hilar fullness.   Electronically Signed   By: Rolm Baptise M.D.   On: 05/26/2014 12:34    Dg Chest 1 View  12/27/2013   CLINICAL DATA:  Post right upper lobe nodule biopsy, no chest pain or shortness of breath.  EXAM: CHEST - 1 VIEW  COMPARISON:  CT BIOPSY dated 12/27/2013; CT CHEST W/CM dated 12/16/2013; DG CHEST 2 VIEW dated 03/04/2013  FINDINGS: Grossly unchanged borderline enlarged cardiac silhouette and mediastinal contours. Known centrally cavitary nodule within the right upper lobe is not well demonstrated on the present examination. No pleural effusion or pneumothorax following right upper lobe lung nodule biopsy. No evidence of edema. Unchanged bones.  IMPRESSION: No evidence of complication following right  upper lung nodule biopsy.   Electronically Signed   By: Sandi Mariscal M.D.   On: 12/27/2013 12:01   Nm Pet Image Restag (ps) Skull Base To Thigh  01/11/2014   CLINICAL DATA:  Subsequent treatment strategy for esophageal cancer. Recent biopsy of a cavitary right upper lobe lung lesion revealed invasive squamous cell carcinoma.  EXAM: NUCLEAR MEDICINE PET SKULL BASE TO THIGH  FASTING BLOOD GLUCOSE:  Value: 92 mg/dl  TECHNIQUE: 6.3 mCi F-18 FDG was injected intravenously. Full-ring PET imaging was performed from the skull base to thigh after the radiotracer. CT data was obtained and used for attenuation correction and anatomic localization.  COMPARISON:  CT BIOPSY dated 12/27/2013   FINDINGS: NECK  No hypermetabolic lymph nodes in the neck.  CHEST  Gastric pull-through noted. Cavitary posterior right upper lobe mass, 2.1 x 1.5 cm, maximum standard uptake value 7.7. Posterior left infrahilar lymph node, 2.3 x 2.0 cm, maximum standard uptake value 9.8. Adjacent indistinct infrahilar node has maximum standard uptake value of 5.9.  A vascular structure or small lymph node posterior to the descending thoracic aorta has a maximum standard uptake value of 3.4 and is probably incidental.  ABDOMEN/PELVIS  Mid transverse colon has some faintly increased signal but is also small in caliber, unless this increased signal (standard uptake value 6.1) is probably physiologic. Mildly prominent but gas-filled appendix does not appear actively inflamed. It is  SKELETON  No focal hypermetabolic activity to suggest skeletal metastasis.  IMPRESSION: 1. Hypermetabolic cavitary right upper lobe mass corresponding to the known invasive squamous cell carcinoma. Pathologic right infrahilar adenopathy. 2. Faintly increased activity in the mid transverse colon is probably physiologic.   Electronically Signed   By: Sherryl Barters M.D.   On: 01/11/2014 11:46   Ct Biopsy  12/27/2013   INDICATION: History of esophageal cancer, now with enlarging centrally necrotic nodule within the right upper lobe. Please obtain biopsy to evaluate for either metastatic disease versus primary lung cancer.  EXAM: CT BIOPSY  MEDICATIONS: Fentanyl 75 mcg IV; Versed 1.5 mg IV  ANESTHESIA/SEDATION: Sedation time  20 minutes  CONTRAST:  None  COMPARISON:  NM PET IMAGE INITIAL (PI) SKULL BASE TO THIGH dated 06/11/2012; CT CHEST W/CM dated 12/16/2013; CT CHEST W/CM dated 06/15/2013  PROCEDURE: Informed consent was obtained from the patient (via the use of a medical translator) following an explanation of the procedure, risks, benefits and alternatives. The patient understands,agrees and consents for the procedure. All questions were addressed. A time  out was performed prior to the initiation of the procedure.  The patient was positioned right lateral decubitus on the CT table and a limited chest CT was performed for procedural planning demonstrating no change to minimal increase in size of the now approximately 1.7 x 1.4 cm nodule within the right upper lobe (image 5, series 3). Note, there has been interval increased central cavitation of this nodule. The operative site was prepped and draped in the usual sterile fashion. Under sterile conditions and local anesthesia, a 22 gauge spinal needle was utilized for procedural planning. This was followed by the placement of a 17 gauge coaxial needle into the peripheral caudal aspect of the nodule. Positioning was confirmed with intermittent CT fluoroscopy and followed by the acquisition of 2 core needle biopsies with an 18 gauge core needle biopsy device.  Limited post procedural chest CT was negative for pneumothorax or additional complication. The co-axial needle was removed and hemostasis was achieved with manual compression. A dressing was placed.  The patient tolerated the procedure well without immediate postprocedural complication. The patient was escorted to have an upright chest radiograph.  COMPLICATIONS: None immediate.  IMPRESSION: Technically successful CT guided core needle core biopsy of enlarging centrally necrotic nodule within the right upper lobe.   Electronically Signed   By: Sandi Mariscal M.D.   On: 12/27/2013 13:19      Recent Lab Findings: Lab Results  Component Value Date   WBC 7.2 04/01/2014   HGB 13.7 04/01/2014   HCT 41.4 04/01/2014   PLT 226 04/01/2014   GLUCOSE 95 04/01/2014   ALT 30 04/01/2014   AST 21 04/01/2014   NA 141 04/01/2014   K 4.3 04/01/2014   CL 101 02/04/2014   CREATININE 0.9 04/01/2014   BUN 10.6 04/01/2014   CO2 22 04/01/2014   INR 0.90 01/28/2014      Assessment / Plan:   Patient is stable after  wedge resection of right lung metastasis from squamous cell carcinoma  of the esophagus and node dissection. He's made good progress postoperatively He is to have a followup CT scan in early August scheduled by oncology I Plan to see him back in 3 months   [name] MD      [address] [place] [address] Office [phone]   Beeper [phone]  05/26/2014 12:48 PM

## 2014-06-09 DIAGNOSIS — Z0271 Encounter for disability determination: Secondary | ICD-10-CM

## 2014-06-27 ENCOUNTER — Telehealth: Payer: Self-pay | Admitting: *Deleted

## 2014-06-27 NOTE — Telephone Encounter (Signed)
VM to inquire if we have received a "letter of consensus" that was faxed on 06/20/14. This RN unaware of document. Forwarded message to managed care to follow up.

## 2014-07-01 ENCOUNTER — Telehealth: Payer: Self-pay | Admitting: Oncology

## 2014-07-01 NOTE — Telephone Encounter (Signed)
Spk w/lady advised of apt, confirmed and mailed out ltr on 08/10, cld back today to see if he got msg had to lv msg again...KJ

## 2014-07-04 ENCOUNTER — Other Ambulatory Visit: Payer: Self-pay | Admitting: Oncology

## 2014-07-04 ENCOUNTER — Ambulatory Visit (HOSPITAL_COMMUNITY)
Admission: RE | Admit: 2014-07-04 | Discharge: 2014-07-04 | Disposition: A | Discharge status: Home / Self Care | Payer: BC Managed Care – PPO | Source: Ambulatory Visit | Attending: Oncology | Admitting: Oncology

## 2014-07-04 ENCOUNTER — Encounter (HOSPITAL_COMMUNITY): Payer: Self-pay

## 2014-07-04 ENCOUNTER — Ambulatory Visit (HOSPITAL_BASED_OUTPATIENT_CLINIC_OR_DEPARTMENT_OTHER): Payer: BC Managed Care – PPO | Admitting: Oncology

## 2014-07-04 ENCOUNTER — Telehealth: Payer: Self-pay | Admitting: Oncology

## 2014-07-04 VITALS — BP 110/64 | HR 58 | Temp 97.9°F | Resp 18 | Ht 63.0 in | Wt 126.3 lb

## 2014-07-04 DIAGNOSIS — R0989 Other specified symptoms and signs involving the circulatory and respiratory systems: Secondary | ICD-10-CM | POA: Diagnosis not present

## 2014-07-04 DIAGNOSIS — J90 Pleural effusion, not elsewhere classified: Secondary | ICD-10-CM | POA: Insufficient documentation

## 2014-07-04 DIAGNOSIS — C155 Malignant neoplasm of lower third of esophagus: Secondary | ICD-10-CM | POA: Diagnosis not present

## 2014-07-04 DIAGNOSIS — C7800 Secondary malignant neoplasm of unspecified lung: Secondary | ICD-10-CM

## 2014-07-04 DIAGNOSIS — C771 Secondary and unspecified malignant neoplasm of intrathoracic lymph nodes: Secondary | ICD-10-CM

## 2014-07-04 MED ORDER — IOHEXOL 300 MG/ML  SOLN
80.0000 mL | Freq: Once | INTRAMUSCULAR | Status: AC | PRN
  Administered 2014-07-04: 80 mL via INTRAVENOUS

## 2014-07-04 NOTE — Progress Notes (Signed)
[place] OFFICE PROGRESS NOTE   Diagnosis: Esophagus cancer  INTERVAL HISTORY:   He returns as scheduled. He reports a fatiguing easily. He becomes "dizzy "after working more than 10 or 20 minutes. Good appetite. No dysphagia or dyspnea. No pain.  Objective:  Vital signs in last 24 hours:  Blood pressure 110/64, pulse 58, temperature 97.9 F (36.6 C), temperature source Oral, resp. rate 18, height 5\' 3"  (1.6 m), weight 126 lb 4.8 oz (57.289 kg), SpO2 100.00%.    HEENT: Neck without mass Lymphatics: No cervical or supraclavicular nodes. "Shotty "right axillary nodes. Resp: Lungs clear bilaterally, no expiratory distress Cardio: Regular rate and rhythm GI: No hepatomegaly, nontender, no mass Vascular: No leg edema    Imaging:  Ct Chest W Contrast  07/04/2014   CLINICAL DATA:  Esophageal carcinoma with partial lung resection. Chemotherapy and radiation therapy previously.  EXAM: CT CHEST WITH CONTRAST  TECHNIQUE: Multidetector CT imaging of the chest was performed during intravenous contrast administration.  CONTRAST:  76mL OMNIPAQUE IOHEXOL 300 MG/ML  SOLN  COMPARISON:  PET-CT 01/11/2014  FINDINGS: No axillary or supraclavicular lymphadenopathy. And gastric pull-up anatomy. Right infrahilar 15 mm lymph node at site of prior 23 mm lymph node (image 27, series 2). This has enhancing rim is concerning for active neoplasm. The previously described right lower lobe cavitary nodule has been surgically removed.  There is a moderate right pleural effusion. There is a focus of consolidation/ atelectasis in the medial right lower lobe (image 43, series 2). No nodules in the left lung.  Along the descending thoracic aorta there is a 6 mm nodule (image 37, series 2) which compares to 6 mm which is mildly hypermetabolic on comparison FDG PET scan.  No enhancing liver lesion. Adrenal glands are normal. No aggressive osseous lesion.  IMPRESSION: 1. Interval right lower lobe resection  of metastatic nodule. 2. Residual right pleural effusion and basilar consolidation. 3. Residual right infrahilar lymph node is concerning for metastatic adenopathy. 4. Small nodule along the descending thoracic aorta is mildly hypermetabolic on comparison PET-CT scan. Recommend continued attention on follow-up.   Electronically Signed   By: Suzy Bouchard M.D.   On: 07/04/2014 12:06    Medications: I have reviewed the patient's current medications.  Assessment/Plan: 1. 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 Staging CT scans of the chest, abdomen and pelvis on 06/02/2012 negative for evidence of metastatic disease. Endoscopic ultrasound 06/11/2012 confirmed a uT3 uN1 tumor at 40 cm from the incisors. He began radiation 06/15/2012. He began weekly Taxol/carboplatin chemotherapy 06/16/2012. He completed 6 weekly chemotherapy treatments. The carboplatin was held with week 5 due to thrombocytopenia. He completed the sixth weekly treatment with Taxol and carboplatin on 07/23/2012. He completed radiation on 07/29/2012 -he underwent an esophagectomy on 08/26/2012 with the pathology confirming a ypT3,ypN0 squamous cell carcinoma with perineural invasion identified.  2. History of solid/liquid dysphagia secondary to the obstructing esophagus mass. He reports recurrent solid dysphagia 3. Reflux. He is taking Protonix 4. Chest CT 06/15/2013 with postoperative changes from esophagectomy and gastric pull-through. 7 mm posterior subpleural nodule in the right lower lobe. Followup CT scan 12/16/2013 revealed an increase in the size of a right hilar node and right upper lobe nodule. 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 I reviewed the CT images with Mr. Fedie and his daughter. He understands the hilar and periaortic lymph nodes likely indicate residual esophagus cancer. The pleural effusion is most likely related to surgery, but could be malignant.  We decided to continue observation. He will return for an office visit and chest x-ray in 2 months.  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  Betsy Coder, MD  07/04/2014  12:12 PM

## 2014-07-04 NOTE — Telephone Encounter (Signed)
gv and printed appt sched and avs for pt for OCT...emailed BS to add orders for scan

## 2014-07-05 ENCOUNTER — Ambulatory Visit: Payer: BC Managed Care – PPO | Admitting: Oncology

## 2014-07-19 ENCOUNTER — Other Ambulatory Visit: Payer: Self-pay | Admitting: Nurse Practitioner

## 2014-07-19 ENCOUNTER — Encounter (HOSPITAL_COMMUNITY): Payer: Self-pay | Admitting: Emergency Medicine

## 2014-07-19 ENCOUNTER — Emergency Department (HOSPITAL_COMMUNITY): Payer: BC Managed Care – PPO

## 2014-07-19 ENCOUNTER — Inpatient Hospital Stay (HOSPITAL_COMMUNITY)
Admission: EM | Admit: 2014-07-19 | Discharge: 2014-07-26 | DRG: 388 | Disposition: A | Discharge status: Home / Self Care | Payer: BC Managed Care – PPO | Attending: Internal Medicine | Admitting: Internal Medicine

## 2014-07-19 DIAGNOSIS — C7800 Secondary malignant neoplasm of unspecified lung: Secondary | ICD-10-CM

## 2014-07-19 DIAGNOSIS — IMO0002 Reserved for concepts with insufficient information to code with codable children: Secondary | ICD-10-CM

## 2014-07-19 DIAGNOSIS — E46 Unspecified protein-calorie malnutrition: Secondary | ICD-10-CM | POA: Diagnosis present

## 2014-07-19 DIAGNOSIS — Z79899 Other long term (current) drug therapy: Secondary | ICD-10-CM

## 2014-07-19 DIAGNOSIS — Z9221 Personal history of antineoplastic chemotherapy: Secondary | ICD-10-CM

## 2014-07-19 DIAGNOSIS — Z923 Personal history of irradiation: Secondary | ICD-10-CM

## 2014-07-19 DIAGNOSIS — R112 Nausea with vomiting, unspecified: Secondary | ICD-10-CM | POA: Diagnosis not present

## 2014-07-19 DIAGNOSIS — T50995A Adverse effect of other drugs, medicaments and biological substances, initial encounter: Secondary | ICD-10-CM | POA: Diagnosis not present

## 2014-07-19 DIAGNOSIS — K56609 Unspecified intestinal obstruction, unspecified as to partial versus complete obstruction: Secondary | ICD-10-CM | POA: Diagnosis present

## 2014-07-19 DIAGNOSIS — K219 Gastro-esophageal reflux disease without esophagitis: Secondary | ICD-10-CM | POA: Diagnosis present

## 2014-07-19 DIAGNOSIS — N059 Unspecified nephritic syndrome with unspecified morphologic changes: Secondary | ICD-10-CM | POA: Diagnosis not present

## 2014-07-19 DIAGNOSIS — E43 Unspecified severe protein-calorie malnutrition: Secondary | ICD-10-CM

## 2014-07-19 DIAGNOSIS — C155 Malignant neoplasm of lower third of esophagus: Secondary | ICD-10-CM | POA: Diagnosis present

## 2014-07-19 DIAGNOSIS — E876 Hypokalemia: Secondary | ICD-10-CM | POA: Diagnosis present

## 2014-07-19 DIAGNOSIS — N170 Acute kidney failure with tubular necrosis: Secondary | ICD-10-CM | POA: Diagnosis present

## 2014-07-19 DIAGNOSIS — I498 Other specified cardiac arrhythmias: Secondary | ICD-10-CM | POA: Diagnosis present

## 2014-07-19 DIAGNOSIS — D72819 Decreased white blood cell count, unspecified: Secondary | ICD-10-CM | POA: Diagnosis present

## 2014-07-19 DIAGNOSIS — K565 Intestinal adhesions [bands], unspecified as to partial versus complete obstruction: Secondary | ICD-10-CM | POA: Diagnosis not present

## 2014-07-19 DIAGNOSIS — J90 Pleural effusion, not elsewhere classified: Secondary | ICD-10-CM | POA: Diagnosis present

## 2014-07-19 DIAGNOSIS — Z87891 Personal history of nicotine dependence: Secondary | ICD-10-CM

## 2014-07-19 DIAGNOSIS — R1310 Dysphagia, unspecified: Secondary | ICD-10-CM | POA: Diagnosis present

## 2014-07-19 DIAGNOSIS — D72829 Elevated white blood cell count, unspecified: Secondary | ICD-10-CM

## 2014-07-19 DIAGNOSIS — E875 Hyperkalemia: Secondary | ICD-10-CM

## 2014-07-19 LAB — COMPREHENSIVE METABOLIC PANEL
ALK PHOS: 124 U/L — AB (ref 39–117)
ALT: 18 U/L (ref 0–53)
AST: 49 U/L — ABNORMAL HIGH (ref 0–37)
Albumin: 4.8 g/dL (ref 3.5–5.2)
Anion gap: 20 — ABNORMAL HIGH (ref 5–15)
BUN: 16 mg/dL (ref 6–23)
CO2: 21 mEq/L (ref 19–32)
Calcium: 10.8 mg/dL — ABNORMAL HIGH (ref 8.4–10.5)
Chloride: 96 mEq/L (ref 96–112)
Creatinine, Ser: 0.95 mg/dL (ref 0.50–1.35)
GFR calc Af Amer: >90 mL/min (ref >90)
GFR calc non Af Amer: >90 mL/min (ref >90)
Glucose, Bld: 154 mg/dL — ABNORMAL HIGH (ref 70–99)
POTASSIUM: 5.2 meq/L (ref 3.7–5.3)
SODIUM: 137 meq/L (ref 137–147)
TOTAL PROTEIN: 9.3 g/dL — AB (ref 6.0–8.3)
Total Bilirubin: 0.6 mg/dL (ref 0.3–1.2)

## 2014-07-19 LAB — CBC WITH DIFFERENTIAL/PLATELET
Basophils Absolute: 0 10*3/uL (ref 0.0–0.1)
Basophils Relative: 0 % (ref 0–1)
Eosinophils Absolute: 0 10*3/uL (ref 0.0–0.7)
Eosinophils Relative: 0 % (ref 0–5)
HCT: 44.9 % (ref 39.0–52.0)
Hemoglobin: 15.7 g/dL (ref 13.0–17.0)
LYMPHS ABS: 2.7 10*3/uL (ref 0.7–4.0)
LYMPHS PCT: 17 % (ref 12–46)
MCH: 28.6 pg (ref 26.0–34.0)
MCHC: 35 g/dL (ref 30.0–36.0)
MCV: 81.8 fL (ref 78.0–100.0)
Monocytes Absolute: 0.9 10*3/uL (ref 0.1–1.0)
Monocytes Relative: 5 % (ref 3–12)
NEUTROS PCT: 78 % — AB (ref 43–77)
Neutro Abs: 12.4 10*3/uL — ABNORMAL HIGH (ref 1.7–7.7)
PLATELETS: 284 10*3/uL (ref 150–400)
RBC: 5.49 MIL/uL (ref 4.22–5.81)
RDW: 15 % (ref 11.5–15.5)
WBC: 16 10*3/uL — AB (ref 4.0–10.5)

## 2014-07-19 LAB — POC OCCULT BLOOD, ED: FECAL OCCULT BLD: NEGATIVE

## 2014-07-19 LAB — LIPASE, BLOOD: Lipase: 15 U/L (ref 11–59)

## 2014-07-19 MED ORDER — ONDANSETRON HCL 4 MG/2ML IJ SOLN
4.0000 mg | Freq: Once | INTRAMUSCULAR | Status: AC
  Administered 2014-07-19: 4 mg via INTRAVENOUS
  Filled 2014-07-19: qty 2

## 2014-07-19 MED ORDER — IOHEXOL 300 MG/ML  SOLN
100.0000 mL | Freq: Once | INTRAMUSCULAR | Status: AC | PRN
  Administered 2014-07-19: 100 mL via INTRAVENOUS

## 2014-07-19 MED ORDER — MORPHINE SULFATE 4 MG/ML IJ SOLN
4.0000 mg | Freq: Once | INTRAMUSCULAR | Status: AC
  Administered 2014-07-19: 4 mg via INTRAVENOUS
  Filled 2014-07-19: qty 1

## 2014-07-19 MED ORDER — HYDROMORPHONE HCL PF 1 MG/ML IJ SOLN
1.0000 mg | Freq: Once | INTRAMUSCULAR | Status: AC
  Administered 2014-07-19: 1 mg via INTRAVENOUS
  Filled 2014-07-19: qty 1

## 2014-07-19 NOTE — ED Provider Notes (Signed)
CSN: [id number]     Arrival date & time 07/19/14  2017 History   First MD Initiated Contact with Patient 07/19/14 2041     Chief Complaint  Patient presents with  . Abdominal Pain     HPI Bruce [name] is a 53 y.o. male with PMH of esophagus and lung cancer, esophagitis complaining of severe abdominal pain in epigastric region that started this afternoon. Pain started at noon but did not become severe until 6:30pm with vomiting. Pain is persistent and worsened with movement and nothing makes it better. Pain does not radiate. Patient endorses nausea and vomiting. Emesis is nonbloody, nonbilious and not coffee grounds. Patient unable to tolerate fluids and has not eaten since onset of pain. Patient had BM yesterday and it was normal. Patient denies melena or hematochezia. Patient denies genital or rectal pain. No urinary symptoms. Patient endorses weakness. No abdominal surgeries. Patient has had esophagus and lung surgery. Patient denies fevers, chills, night sweats or recent weight loss. Patient denies NSAID use or excessive alcohol use.  Patient with chest CT 07/04/14 with impression below. Patient decided not to undergo chemotherapy treatment at this time.  IMPRESSION:  1. Interval right lower lobe resection of metastatic nodule.  2. Residual right pleural effusion and basilar consolidation.  3. Residual right infrahilar lymph node is concerning for metastatic  adenopathy.  4. Small nodule along the descending thoracic aorta is mildly  hypermetabolic on comparison PET-CT scan. Recommend continued  attention on follow-up.    Past Medical History  Diagnosis Date  . Esophagitis   . Mass of esophagus 05/28/2012    BX'D DISTAL ESOPHAGUS,PENDING  . Dysphasia     solid and liquid  . Neuropathy     right hand numbness 1 year 2012  . 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 Lung cancer 12/27/13    right upper lung   Past Surgical History  Procedure Laterality Date  .  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 Eus  06/11/2012    Procedure: UPPER ENDOSCOPIC ULTRASOUND (EUS) RADIAL;  Surgeon: Milus Banister, MD;  Location: [facility];  Service: Endoscopy;  Laterality: N/A;  . Complete esophagectomy  08/26/2012    Procedure: ESOPHAGECTOMY COMPLETE;  Surgeon: [name], MD;  Location: [place];  Service: Thoracic;  Laterality: N/A;  transhiatal   . Pyloroplasty  08/26/2012    Procedure: PYLOROPLASTY;  Surgeon: [name], MD;  Location: [place];  Service: Thoracic;  Laterality: N/A;  . Video bronchoscopy  08/26/2012    Procedure: VIDEO BRONCHOSCOPY;  Surgeon: [name], MD;  Location: [place];  Service: Thoracic;  Laterality: N/A;  . Jejunostomy  08/26/2012    Procedure: JEJUNOSTOMY;  Surgeon: [name], MD;  Location: [place];  Service: Thoracic;  Laterality: N/A;  placement of feeding jujunostomy tube  . Video bronchoscopy N/A 01/31/2014    Procedure: VIDEO BRONCHOSCOPY;  Surgeon: [name], MD;  Location: [facility] OR;  Service: Thoracic;  Laterality: N/A;  . Video assisted thoracoscopy (vats)/wedge resection Right 01/31/2014    Procedure: VIDEO ASSISTED THORACOSCOPY (VATS)/WEDGE RESECTION; with insertion of On Q pain pump;  Surgeon: [name], MD;  Location: [place];  Service: Thoracic;  Laterality: Right;  with insertion of On Q pain pump  . Lymph node dissection Right 01/31/2014    Procedure: LYMPH NODE DISSECTION;  Surgeon: [name], MD;  Location: [facility] OR;  Service: Thoracic;  Laterality: Right;   Family History  Problem Relation Age of Onset  . Breast cancer Sister    History  Substance Use Topics  . Smoking status: Former Smoker -- 0.40 packs/day for 25 years    Types: Cigarettes    Quit date: 06/19/2011  . Smokeless tobacco: Former Systems developer    Quit date: 06/03/2007  . Alcohol Use: No     Comment: not currently (01/28/14)    Review of  Systems  Constitutional: Negative for fever and chills.  HENT: Negative for congestion and rhinorrhea.   Eyes: Negative for visual disturbance.  Respiratory: Negative for cough and shortness of breath.   Cardiovascular: Negative for chest pain and palpitations.  Gastrointestinal: Positive for nausea, vomiting and abdominal pain. Negative for diarrhea and blood in stool.  Genitourinary: Negative for dysuria and hematuria.  Musculoskeletal: Negative for back pain and gait problem.  Skin: Negative for rash.  Neurological: Negative for weakness and headaches.      Allergies  Review of patient's allergies indicates no known allergies.  Home Medications   Prior to Admission medications   Medication Sig Start Date End Date Taking? Authorizing Provider  pantoprazole (PROTONIX) 40 MG tablet Take 40 mg by mouth daily as needed (heart burn).   Yes Historical Provider, MD   BP 124/69  Pulse 72  Temp(Src) 97.9 F (36.6 C) (Oral)  Resp 22  SpO2 100% Physical Exam  Nursing note and vitals reviewed. Constitutional: He appears well-developed and well-nourished.  Patient rolling around on stretcher due to pain.  HENT:  Head: Normocephalic and atraumatic.  Eyes: Conjunctivae and EOM are normal. Right eye exhibits no discharge. Left eye exhibits no discharge. No scleral icterus.  Cardiovascular: Normal rate, regular rhythm and normal heart sounds.   Pulmonary/Chest: Effort normal and breath sounds normal. No respiratory distress. He has no wheezes.  Abdominal: Soft. Bowel sounds are normal. He exhibits no distension.  Mild-moderate distension and diffuse severe abdominal tenderness with voluntary guarding. No rebound or signs of peritonitis.   Musculoskeletal: Normal range of motion. He exhibits no tenderness.  Neurological: He is alert. He exhibits normal muscle tone. Coordination normal.  Skin: Skin is warm. He is diaphoretic.  Psychiatric: He has a normal mood and affect. His behavior is  normal.    ED Course  Procedures (including critical care time) Labs Review Labs Reviewed  COMPREHENSIVE METABOLIC PANEL - Abnormal; Notable for the following:    Glucose, Bld 154 (*)    Calcium 10.8 (*)    Total Protein 9.3 (*)    AST 49 (*)    Alkaline Phosphatase 124 (*)    Anion gap 20 (*)    All other components within normal limits  CBC WITH DIFFERENTIAL - Abnormal; Notable for the following:    WBC 16.0 (*)    Neutrophils Relative % 78 (*)    Neutro Abs 12.4 (*)    All other components within normal limits  LIPASE, BLOOD  URINALYSIS, ROUTINE W REFLEX MICROSCOPIC  OCCULT BLOOD X 1 CARD TO LAB, STOOL  I-STAT TROPOININ, ED  I-STAT CG4 LACTIC ACID, ED  POC OCCULT BLOOD, ED    Imaging Review Dg Chest 2 View  07/19/2014   CLINICAL DATA:  ABDOMINAL PAIN  EXAM: CHEST - 2 VIEW  COMPARISON:  07/04/2014 and earlier studies  FINDINGS: Small right pleural effusion. Stable linear opacities in the right mid lung and to a lesser degree at both lung bases. Heart size remains  normal.  Visualized skeletal structures are unremarkable.  IMPRESSION: 1. Stable right pleural effusion and linear pulmonary opacities. No acute abnormality.   Electronically Signed   By: Arne Cleveland M.D.   On: 07/19/2014 23:11   Ct Abdomen Pelvis W Contrast  07/19/2014   CLINICAL DATA:  ABDOMINAL PAIN  EXAM: CT ABDOMEN AND PELVIS WITH CONTRAST  TECHNIQUE: Multidetector CT imaging of the abdomen and pelvis was performed using the standard protocol following bolus administration of intravenous contrast.  CONTRAST:  145mL OMNIPAQUE IOHEXOL 300 MG/ML  SOLN  COMPARISON:  07/04/2014 and earlier studies  FINDINGS: Persistent moderate right pleural effusion. Dependent atelectasis posteriorly in the visualized right lower lobe. Changes of gastric pull-through with distention of the visualized stomach in the lower chest. Multiple dilated proximal small bowel loops, with transition to more normal caliber distal small bowel in the  right mid abdomen. No discrete etiology. The colon is nondistended. No ascites. No free air. No adenopathy identified. Portal vein patent. Unremarkable liver, gallbladder, spleen, adrenal glands, kidneys, pancreas. Urinary bladder incompletely distended. Moderate prostatic enlargement. Regional bones unremarkable.  IMPRESSION: 1. Mid small bowel obstruction without discrete mass or evident etiology, suggesting adhesions. 2. Stable moderate right pleural effusion.   Electronically Signed   By: Arne Cleveland M.D.   On: 07/19/2014 23:07     EKG Interpretation   Date/Time:  Tuesday July 19 2014 20:39:53 EDT Ventricular Rate:  67 PR Interval:  138 QRS Duration: 84 QT Interval:  391 QTC Calculation: 413 R Axis:   66 Text Interpretation:  Sinus rhythm ST elev, probable normal early repol  pattern No reciprocole abnormality Serial tracing suggested Confirmed by  [facility]  MD, ELLIOTT [phone]) on 07/19/2014 9:59:00 PM     Meds given in ED:  Medications  morphine 4 MG/ML injection 4 mg (4 mg Intravenous Given 07/19/14 2115)  ondansetron (ZOFRAN) injection 4 mg (4 mg Intravenous Given 07/19/14 2115)  ondansetron (ZOFRAN) injection 4 mg (4 mg Intravenous Given 07/19/14 2223)  HYDROmorphone (DILAUDID) injection 1 mg (1 mg Intravenous Given 07/19/14 2223)  iohexol (OMNIPAQUE) 300 MG/ML solution 100 mL (100 mLs Intravenous Contrast Given 07/19/14 2240)  ondansetron (ZOFRAN) injection 4 mg (4 mg Intravenous Given 07/19/14 2332)    New Prescriptions   No medications on file      MDM   Final diagnoses:  Small bowel obstruction   Bruce [name] is a 53 y.o. male with PMH of lung and esophageal cancer complaining of severe epigastric abdominal pain that started this afternoon. Patient has associated nausea and vomiting and is unable to keep anything down. Patient is afebrile and other VSS. Patient is diaphoretic, uncomfortable and actively vomiting. Patient with severe abdominal tenderness and  mild-moderate distention with +BS with guarding and no rebound. Patient given pain medications with improvement. Patient is actively vomiting in ED and given multiple doses of zofran. WBC 16. Other labs unremarkable or pending. Lactic acid pending. CXR without acute abnormalities. Normal EKG. Patient with negative fecal occult. CT abdomen with small bowel obstruction without discrete mass or evident etiology.   Plan to admit to hospitalist service for further evaluation and management with endoscopy. Dr. Humphrey Rolls agrees to admit.   Discussed all results and patient verbalizes understanding and agrees with plan.  This is a shared patient. This patient was discussed with the physician, Dr. Eulis Foster who saw and evaluated the patient.    Pura Spice, PA-C 07/20/14 [phone]

## 2014-07-19 NOTE — ED Provider Notes (Signed)
  Face-to-face evaluation   History: Abdominal pain, which started at noon today. He has had constipation recently, characterized by hard stool. He has not had vomiting, fever, or diarrhea. He is not currently receiving active chemotherapy therapy for lung cancer with metastases.  Physical exam: Slender, cooperative. Abdomen normal bowel sounds, soft, mild, mid abdominal tenderness without rebound, tenderness, or mass.    Case discussed with Gen. Surgery- Dr. Excell Seltzer will see if needed.   12:00 AM-Consult complete with Dr. Humphrey Rolls. Patient case explained and discussed. He agrees to admit patient for further evaluation and treatment. Call ended at 00:10    Date: 07/19/14 20:39  Rate: 67  Rhythm: normal sinus rhythm  QRS Axis: normal  PR and QT Intervals: normal  ST/T Wave abnormalities: nonspecific ST changes  PR and QRS Conduction Disutrbances:none  Narrative Interpretation:   Old EKG Reviewed: unchanged   Medical screening examination/treatment/procedure(s) were conducted as a shared visit with non-physician practitioner(s) and myself.  I personally evaluated the patient during the encounter       Richarda Blade, MD 07/20/14 [phone]

## 2014-07-19 NOTE — ED Notes (Signed)
PA-C at bedside 

## 2014-07-19 NOTE — ED Notes (Signed)
Bed: WTR7 Expected date:  Expected time:  Means of arrival:  Comments: 

## 2014-07-19 NOTE — ED Notes (Signed)
Pt's daughter reports severe abd pain with nausea today.  Pt has hx of esophageal and lung cancer.  Pt appears pale.

## 2014-07-20 ENCOUNTER — Inpatient Hospital Stay (HOSPITAL_COMMUNITY): Payer: BC Managed Care – PPO

## 2014-07-20 DIAGNOSIS — E876 Hypokalemia: Secondary | ICD-10-CM | POA: Diagnosis present

## 2014-07-20 DIAGNOSIS — E43 Unspecified severe protein-calorie malnutrition: Secondary | ICD-10-CM

## 2014-07-20 DIAGNOSIS — C155 Malignant neoplasm of lower third of esophagus: Secondary | ICD-10-CM

## 2014-07-20 DIAGNOSIS — E46 Unspecified protein-calorie malnutrition: Secondary | ICD-10-CM | POA: Diagnosis present

## 2014-07-20 DIAGNOSIS — C7800 Secondary malignant neoplasm of unspecified lung: Secondary | ICD-10-CM

## 2014-07-20 DIAGNOSIS — Z87891 Personal history of nicotine dependence: Secondary | ICD-10-CM | POA: Diagnosis not present

## 2014-07-20 DIAGNOSIS — R1310 Dysphagia, unspecified: Secondary | ICD-10-CM | POA: Diagnosis present

## 2014-07-20 DIAGNOSIS — E875 Hyperkalemia: Secondary | ICD-10-CM | POA: Diagnosis not present

## 2014-07-20 DIAGNOSIS — R112 Nausea with vomiting, unspecified: Secondary | ICD-10-CM | POA: Diagnosis present

## 2014-07-20 DIAGNOSIS — K565 Intestinal adhesions [bands], unspecified as to partial versus complete obstruction: Secondary | ICD-10-CM | POA: Diagnosis present

## 2014-07-20 DIAGNOSIS — D72819 Decreased white blood cell count, unspecified: Secondary | ICD-10-CM | POA: Diagnosis present

## 2014-07-20 DIAGNOSIS — Z9221 Personal history of antineoplastic chemotherapy: Secondary | ICD-10-CM | POA: Diagnosis not present

## 2014-07-20 DIAGNOSIS — Z79899 Other long term (current) drug therapy: Secondary | ICD-10-CM | POA: Diagnosis not present

## 2014-07-20 DIAGNOSIS — I498 Other specified cardiac arrhythmias: Secondary | ICD-10-CM | POA: Diagnosis present

## 2014-07-20 DIAGNOSIS — Z923 Personal history of irradiation: Secondary | ICD-10-CM | POA: Diagnosis not present

## 2014-07-20 DIAGNOSIS — T50995A Adverse effect of other drugs, medicaments and biological substances, initial encounter: Secondary | ICD-10-CM | POA: Diagnosis not present

## 2014-07-20 DIAGNOSIS — D72829 Elevated white blood cell count, unspecified: Secondary | ICD-10-CM | POA: Diagnosis present

## 2014-07-20 DIAGNOSIS — J90 Pleural effusion, not elsewhere classified: Secondary | ICD-10-CM | POA: Diagnosis present

## 2014-07-20 DIAGNOSIS — N059 Unspecified nephritic syndrome with unspecified morphologic changes: Secondary | ICD-10-CM | POA: Diagnosis not present

## 2014-07-20 DIAGNOSIS — IMO0002 Reserved for concepts with insufficient information to code with codable children: Secondary | ICD-10-CM | POA: Diagnosis not present

## 2014-07-20 DIAGNOSIS — N170 Acute kidney failure with tubular necrosis: Secondary | ICD-10-CM | POA: Diagnosis present

## 2014-07-20 DIAGNOSIS — K56609 Unspecified intestinal obstruction, unspecified as to partial versus complete obstruction: Secondary | ICD-10-CM | POA: Diagnosis present

## 2014-07-20 DIAGNOSIS — K219 Gastro-esophageal reflux disease without esophagitis: Secondary | ICD-10-CM | POA: Diagnosis present

## 2014-07-20 LAB — GLUCOSE, CAPILLARY: GLUCOSE-CAPILLARY: 247 mg/dL — AB (ref 70–99)

## 2014-07-20 LAB — TSH: TSH: 0.853 u[IU]/mL (ref 0.350–4.500)

## 2014-07-20 LAB — HEMOGLOBIN A1C
Hgb A1c MFr Bld: 6.2 % — ABNORMAL HIGH (ref <5.7)
Mean Plasma Glucose: 131 mg/dL — ABNORMAL HIGH (ref <117)

## 2014-07-20 MED ORDER — ONDANSETRON HCL 4 MG/2ML IJ SOLN
4.0000 mg | Freq: Four times a day (QID) | INTRAMUSCULAR | Status: DC | PRN
  Administered 2014-07-20: 4 mg via INTRAVENOUS
  Filled 2014-07-20: qty 2

## 2014-07-20 MED ORDER — SODIUM CHLORIDE 0.9 % IV SOLN
INTRAVENOUS | Status: DC
  Administered 2014-07-21 – 2014-07-22 (×4): via INTRAVENOUS

## 2014-07-20 MED ORDER — HYDROMORPHONE HCL PF 1 MG/ML IJ SOLN
1.0000 mg | INTRAMUSCULAR | Status: DC | PRN
  Administered 2014-07-20 (×2): 1 mg via INTRAVENOUS
  Filled 2014-07-20 (×2): qty 1

## 2014-07-20 MED ORDER — PIPERACILLIN-TAZOBACTAM 3.375 G IVPB
3.3750 g | Freq: Three times a day (TID) | INTRAVENOUS | Status: DC
  Administered 2014-07-20 (×3): 3.375 g via INTRAVENOUS
  Filled 2014-07-20 (×4): qty 50

## 2014-07-20 MED ORDER — HYDROMORPHONE HCL PF 2 MG/ML IJ SOLN
2.0000 mg | INTRAMUSCULAR | Status: DC | PRN
  Administered 2014-07-20: 2 mg via INTRAVENOUS
  Filled 2014-07-20: qty 1

## 2014-07-20 MED ORDER — HYDROMORPHONE HCL PF 1 MG/ML IJ SOLN
1.0000 mg | INTRAMUSCULAR | Status: DC | PRN
Start: 2014-07-20 — End: 2014-07-20

## 2014-07-20 MED ORDER — ONDANSETRON HCL 4 MG PO TABS: 4.0000 mg | ORAL_TABLET | Freq: Four times a day (QID) | ORAL | Status: DC | PRN

## 2014-07-20 MED ORDER — LIP MEDEX EX OINT
TOPICAL_OINTMENT | CUTANEOUS | Status: DC | PRN
  Administered 2014-07-26: 10:00:00 via TOPICAL

## 2014-07-20 MED ORDER — ONDANSETRON 8 MG/NS 50 ML IVPB
8.0000 mg | Freq: Four times a day (QID) | INTRAVENOUS | Status: DC | PRN
  Administered 2014-07-20: 8 mg via INTRAVENOUS
  Filled 2014-07-20 (×4): qty 8

## 2014-07-20 MED ORDER — PANTOPRAZOLE SODIUM 40 MG IV SOLR
40.0000 mg | Freq: Two times a day (BID) | INTRAVENOUS | Status: DC
  Administered 2014-07-20 – 2014-07-23 (×8): 40 mg via INTRAVENOUS
  Filled 2014-07-20 (×10): qty 40

## 2014-07-20 MED ORDER — SODIUM CHLORIDE 0.9 % IV SOLN
INTRAVENOUS | Status: AC
  Administered 2014-07-20: 02:00:00 via INTRAVENOUS

## 2014-07-20 MED ORDER — CETYLPYRIDINIUM CHLORIDE 0.05 % MT LIQD
7.0000 mL | Freq: Two times a day (BID) | OROMUCOSAL | Status: DC
  Administered 2014-07-20 – 2014-07-26 (×13): 7 mL via OROMUCOSAL

## 2014-07-20 MED ORDER — HYDROMORPHONE HCL PF 2 MG/ML IJ SOLN
3.0000 mg | INTRAMUSCULAR | Status: DC | PRN
  Administered 2014-07-20 (×3): 3 mg via INTRAVENOUS
  Filled 2014-07-20 (×3): qty 2

## 2014-07-20 MED ORDER — LIDOCAINE HCL 2 % EX GEL
1.0000 "application " | Freq: Once | CUTANEOUS | Status: AC
  Administered 2014-07-20: 1
  Filled 2014-07-20: qty 10

## 2014-07-20 MED ORDER — HYDROMORPHONE HCL PF 1 MG/ML IJ SOLN
1.0000 mg | Freq: Once | INTRAMUSCULAR | Status: AC
  Administered 2014-07-20: 1 mg via INTRAVENOUS
  Filled 2014-07-20: qty 1

## 2014-07-20 MED ORDER — PROMETHAZINE HCL 25 MG/ML IJ SOLN
25.0000 mg | Freq: Four times a day (QID) | INTRAMUSCULAR | Status: DC | PRN
  Administered 2014-07-20: 25 mg via INTRAVENOUS
  Filled 2014-07-20: qty 1

## 2014-07-20 NOTE — ED Notes (Signed)
Pt unable to give urine sample

## 2014-07-20 NOTE — Consult Note (Signed)
General Surgery [facility]  Patient seen and examined.  Discussed with family at bedside (daughter).  First episode of SBO since esophagectomy.  Improving with NG decompression today, although still nauseated.  Will follow for now.  AXR in AM 9/3.  Earnstine Regal, MD, [facility] Office: [phone]

## 2014-07-20 NOTE — H&P (Signed)
[facility] History and Physical  Broderick Fonseca UUV:[id number] DOB: [birth year] DOA: 07/19/2014  Referring physician: Christ Kick, MD PCP: No PCP Per Patient   Chief Complaint: Abdominal Pain  HPI: Bruce [name] is a 53 y.o. male presents with abdominal pain. Patient has a history of esophageal carcinoma and has had surgery for resection of the esophagus. The surgery was performed about 2 years ago. Patient has not been currently getting any chemotherapy or radiation. He last ate at 11am and was OK at that time. He also reports having had a normal appearing bowel movement. Patient later in the day developed severe abdominal pain and had some vomiting also. He had no diarrhea noted. The pain appears to be in the epigastric area and does not radiate. Patient has no fevers noted. He has no blood in the stools. He has not vomited any blood. A CT was done in the ED and this shows a SBO possibly due to adhesions. Dr Eulis Foster spoke to surgery on call and they suggested to admit and manage medically with NG tube and drainage. They will see in consult.   Review of Systems:  ROS done through interpreter unremarkable other than what is noted in the HPI  Past Medical History  Diagnosis Date  . Esophagitis   . Mass of esophagus 05/28/2012    BX'D DISTAL ESOPHAGUS,PENDING  . Dysphasia     solid and liquid  . Neuropathy     right hand numbness 1 year 2012  . 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 Lung cancer 12/27/13    right upper lung   Past Surgical History  Procedure Laterality Date  . 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 Eus  06/11/2012    Procedure: UPPER ENDOSCOPIC ULTRASOUND (EUS) RADIAL;  Surgeon: Milus Banister, MD;  Location: [facility];  Service: Endoscopy;  Laterality: N/A;  . Complete esophagectomy  08/26/2012    Procedure: ESOPHAGECTOMY COMPLETE;  Surgeon: [name], MD;  Location: [place];  Service: Thoracic;  Laterality: N/A;  transhiatal   . Pyloroplasty  08/26/2012    Procedure: PYLOROPLASTY;  Surgeon: [name], MD;  Location: [place];  Service: Thoracic;  Laterality: N/A;  . Video bronchoscopy  08/26/2012    Procedure: VIDEO BRONCHOSCOPY;  Surgeon: [name], MD;  Location: [place];  Service: Thoracic;  Laterality: N/A;  . Jejunostomy  08/26/2012    Procedure: JEJUNOSTOMY;  Surgeon: [name], MD;  Location: [place];  Service: Thoracic;  Laterality: N/A;  placement of feeding jujunostomy tube  . Video bronchoscopy N/A 01/31/2014    Procedure: VIDEO BRONCHOSCOPY;  Surgeon: [name], MD;  Location: [facility] OR;  Service: Thoracic;  Laterality: N/A;  . Video assisted thoracoscopy (vats)/wedge resection Right 01/31/2014    Procedure: VIDEO ASSISTED THORACOSCOPY (VATS)/WEDGE RESECTION; with insertion of On Q pain pump;  Surgeon: [name], MD;  Location: [place];  Service: Thoracic;  Laterality: Right;  with insertion of On Q pain pump  . Lymph node dissection Right 01/31/2014    Procedure: LYMPH NODE DISSECTION;  Surgeon: [name], MD;  Location: [place];  Service: Thoracic;  Laterality: Right;   Social History:  reports that he quit smoking about 3 years ago. His smoking use included Cigarettes. He has a 10 pack-year smoking history. He quit smokeless tobacco use about 7 years ago. He reports that  he does not drink alcohol or use illicit drugs.  No Known Allergies  Family History  Problem Relation Age of Onset  . Breast cancer Sister      Prior to Admission medications   Medication Sig Start Date End Date Taking? Authorizing Provider  pantoprazole (PROTONIX) 40 MG tablet Take 40 mg by mouth daily as needed (heart burn).   Yes Historical Provider, MD   Physical Exam: Filed Vitals:   07/19/14 2020 07/19/14 2141  BP: 145/70 124/69  Pulse: 72   Temp: 97.9 F (36.6 C)   TempSrc: Oral   Resp:  22  SpO2: 100%  100%    Wt Readings from Last 3 Encounters:  07/04/14 57.289 kg (126 lb 4.8 oz)  05/26/14 58.06 kg (128 lb)  04/01/14 58.605 kg (129 lb 3.2 oz)    General:  Agitated uncomfortable in pain Eyes: PERRL, normal lids, irises & conjunctiva ENT: grossly normal hearing, lips & tongue Neck: no LAD, masses or thyromegaly Cardiovascular: RRR, tachy. No LE edema. Respiratory: CTA bilaterally, no w/r/r. Normal respiratory effort. Abdomen: Mild distension noted tympanic and has Positive tenderness in the epigastric area. No rebound is noted Skin: no rash or induration seen on limited exam Musculoskeletal: grossly normal tone BUE/BLE Psychiatric: appears in pain Neurologic: grossly non-focal.          Labs on Admission:  Basic Metabolic Panel:  Recent Labs Lab 07/19/14 2043  NA 137  K 5.2  CL 96  CO2 21  GLUCOSE 154*  BUN 16  CREATININE 0.95  CALCIUM 10.8*   Liver Function Tests:  Recent Labs Lab 07/19/14 2043  AST 49*  ALT 18  ALKPHOS 124*  BILITOT 0.6  PROT 9.3*  ALBUMIN 4.8    Recent Labs Lab 07/19/14 2043  LIPASE 15   No results found for this basename: AMMONIA,  in the last 168 hours CBC:  Recent Labs Lab 07/19/14 2043  WBC 16.0*  NEUTROABS 12.4*  HGB 15.7  HCT 44.9  MCV 81.8  PLT 284   Cardiac Enzymes: No results found for this basename: CKTOTAL, CKMB, CKMBINDEX, TROPONINI,  in the last 168 hours  BNP (last 3 results) No results found for this basename: PROBNP,  in the last 8760 hours CBG: No results found for this basename: GLUCAP,  in the last 168 hours  Radiological Exams on Admission: Dg Chest 2 View  07/19/2014   CLINICAL DATA:  ABDOMINAL PAIN  EXAM: CHEST - 2 VIEW  COMPARISON:  07/04/2014 and earlier studies  FINDINGS: Small right pleural effusion. Stable linear opacities in the right mid lung and to a lesser degree at both lung bases. Heart size remains normal.  Visualized skeletal structures are unremarkable.  IMPRESSION: 1. Stable right  pleural effusion and linear pulmonary opacities. No acute abnormality.   Electronically Signed   By: Arne Cleveland M.D.   On: 07/19/2014 23:11   Ct Abdomen Pelvis W Contrast  07/19/2014   CLINICAL DATA:  ABDOMINAL PAIN  EXAM: CT ABDOMEN AND PELVIS WITH CONTRAST  TECHNIQUE: Multidetector CT imaging of the abdomen and pelvis was performed using the standard protocol following bolus administration of intravenous contrast.  CONTRAST:  114mL OMNIPAQUE IOHEXOL 300 MG/ML  SOLN  COMPARISON:  07/04/2014 and earlier studies  FINDINGS: Persistent moderate right pleural effusion. Dependent atelectasis posteriorly in the visualized right lower lobe. Changes of gastric pull-through with distention of the visualized stomach in the lower chest. Multiple dilated proximal small bowel loops, with transition to more normal caliber distal  small bowel in the right mid abdomen. No discrete etiology. The colon is nondistended. No ascites. No free air. No adenopathy identified. Portal vein patent. Unremarkable liver, gallbladder, spleen, adrenal glands, kidneys, pancreas. Urinary bladder incompletely distended. Moderate prostatic enlargement. Regional bones unremarkable.  IMPRESSION: 1. Mid small bowel obstruction without discrete mass or evident etiology, suggesting adhesions. 2. Stable moderate right pleural effusion.   Electronically Signed   By: Arne Cleveland M.D.   On: 07/19/2014 23:07     Assessment/Plan Principal Problem:   SBO (small bowel obstruction) Active Problems:   Malignant neoplasm of lower third of esophagus   1. Small bowel Obstruction -ED spoke with Dr Excell Seltzer and he suggested medical management -will place NG tube -pain control -I have requested a consult by General Surgery they were currently in the OR with a complex case -will start emperic antibiotics due to elevated WBC   2. Malignant neoplasm of lower esophagus -currently being observed by oncology -will monitor   Code Status: Full  Code (must indicate code status--if unknown or must be presumed, indicate so) DVT Prophylaxis:SCDs Family Communication: daughter (indicate person spoken with, if applicable, with phone number if by telephone) Disposition Plan: Home (indicate anticipated LOS)  Time spent: 93min  KHAN,SAADAT A [facility] Pager [phone]  **Disclaimer: This note may have been dictated with voice recognition software. Similar sounding words can inadvertently be transcribed and this note may contain transcription errors which may not have been corrected upon publication of note.**

## 2014-07-20 NOTE — Progress Notes (Signed)
Patient ID: Bruce [name], male   DOB: 07-22-1961, 53 y.o.   MRN: [id number] [facility] PROGRESS NOTE  Jazmin Vensel XBJ:[id number] DOB: 1961-06-03 DOA: 07/19/2014 PCP: No PCP Per Patient  Brief narrative: Addendum to admission note done 07/20/2014 53 y.o. male with past medical history of esophageal carcinoma who presented to [facility] ED 07/19/2014 with worsening abdominal pain, nausea and vomiting for past 24 hours. CT abdomen showed small bowel obstruction due to adhesions.  Assessment/Plan:    Principal Problem:   SBO (small bowel obstruction)  Secondary  to adhesions    Appreciate surgery following   Will stop zosyn as there is no evidence of intraabdominal infection  Active Problems:   Malignant neoplasm of lower third of esophagus  Per oncology   Leukocytosis  Likely stress de-margination  No evidence of infection  Stop zosyn    DVT Prophylaxis   SCD's bilaterally   Code Status: Full.  Family Communication:  plan of care discussed with the patient Disposition Plan: Home when stable.    IV Access:   Peripheral IV Procedures and diagnostic studies:    Dg Chest 2 View 07/19/2014 1. Stable right pleural effusion and linear pulmonary opacities. No acute abnormality.     Ct Abdomen Pelvis W Contrast 07/19/2014  1. Mid small bowel obstruction without discrete mass or evident etiology, suggesting adhesions. 2. Stable moderate right pleural effusion.     Medical Consultants:   Surgery  Other Consultants:   Nutrition  Anti-Infectives:   None     Leisa Lenz, MD  [facility] Pager [phone]  If 7PM-7AM, please contact night-coverage [email or web address] Password [facility] 07/20/2014, 11:34 AM   LOS: 1 day    HPI/Subjective: No acute overnight events.  Objective: Filed Vitals:   07/20/14 0045 07/20/14 0051 07/20/14 0151 07/20/14 0502  BP:   122/77 121/80  Pulse: 89  81 84  Temp:  97.6 F (36.4 C) 98 F (36.7 C) 98.9 F (37.2 C)  TempSrc:  Oral Oral  Oral  Resp: 24  22 20   Height:   5\' 3"  (1.6 m)   Weight:   57.5 kg (126 lb 12.2 oz)   SpO2: 95%  96% 96%    Intake/Output Summary (Last 24 hours) at 07/20/14 1134 Last data filed at 07/20/14 0647  Gross per 24 hour  Intake    154 ml  Output    400 ml  Net   -246 ml    Exam:   General:  Pt is alert, follows commands appropriately, not in acute distress  Cardiovascular: Regular rate and rhythm, S1/S2, no murmurs  Respiratory: Clear to auscultation bilaterally, no wheezing  Abdomen: Soft, non tender, non distended, bowel sounds present; NG tube in place  Extremities: No edema, pulses DP and PT palpable bilaterally  Neuro: Grossly nonfocal  Data Reviewed: Basic Metabolic Panel:  Recent Labs Lab 07/19/14 2043  NA 137  K 5.2  CL 96  CO2 21  GLUCOSE 154*  BUN 16  CREATININE 0.95  CALCIUM 10.8*   Liver Function Tests:  Recent Labs Lab 07/19/14 2043  AST 49*  ALT 18  ALKPHOS 124*  BILITOT 0.6  PROT 9.3*  ALBUMIN 4.8    Recent Labs Lab 07/19/14 2043  LIPASE 15   No results found for this basename: AMMONIA,  in the last 168 hours CBC:  Recent Labs Lab 07/19/14 2043  WBC 16.0*  NEUTROABS 12.4*  HGB 15.7  HCT 44.9  MCV 81.8  PLT 284   Cardiac  Enzymes: No results found for this basename: CKTOTAL, CKMB, CKMBINDEX, TROPONINI,  in the last 168 hours BNP: No components found with this basename: POCBNP,  CBG:  Recent Labs Lab 07/20/14 0740  GLUCAP 247*    No results found for this or any previous visit (from the past 240 hour(s)).   Scheduled Meds: . pantoprazole (PROTONIX) IV  40 mg Intravenous Q12H  . piperacillin-tazobactam (ZOSYN)  IV  3.375 g Intravenous Q8H   Continuous Infusions: . sodium chloride

## 2014-07-20 NOTE — Progress Notes (Signed)
Daughter Mya at bedside, speaks fluent [place].  Pt non English speaking.  Pt noted to be grimacing, moaning and guarding abdominal area. Per daughter, pain currently a 7. States pain meds lasts less than an hr. MD notified. Orders received

## 2014-07-20 NOTE — Progress Notes (Signed)
ANTIBIOTIC CONSULT NOTE - INITIAL Pharmacy Consult for Zosyn Indication: Intra-abdominal infection  No Known Allergies  Patient Measurements: Height: 5\' 3"  (160 cm) Weight: 126 lb 12.2 oz (57.5 kg) IBW/kg (Calculated) : 56.9   Vital Signs: Temp: 98 F (36.7 C) (09/02 0151) Temp src: Oral (09/02 0151) BP: 122/77 mmHg (09/02 0151) Pulse Rate: 81 (09/02 0151) Intake/Output from previous day:   Intake/Output from this shift:    Labs:  Recent Labs  07/19/14 2043  WBC 16.0*  HGB 15.7  PLT 284  CREATININE 0.95   Estimated Creatinine Clearance: 74 ml/min (by C-G formula based on Cr of 0.95). No results found for this basename: VANCOTROUGH, VANCOPEAK, VANCORANDOM, GENTTROUGH, GENTPEAK, GENTRANDOM, TOBRATROUGH, TOBRAPEAK, TOBRARND, AMIKACINPEAK, AMIKACINTROU, AMIKACIN,  in the last 72 hours   Microbiology: No results found for this or any previous visit (from the past 720 hour(s)).  Medical History: Past Medical History  Diagnosis Date  . Esophagitis   . Mass of esophagus 05/28/2012    BX'D DISTAL ESOPHAGUS,PENDING  . Dysphasia     solid and liquid  . Neuropathy     right hand numbness 1 year 2012  . 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 Lung cancer 12/27/13    right upper lung    Medications:  Scheduled:  . sodium chloride   Intravenous STAT  . piperacillin-tazobactam (ZOSYN)  IV  3.375 g Intravenous Q8H   Infusions:  . sodium chloride     Assessment: 21 yoM with hx esophageal carcinoma now with abdominal pain.   Goal of Therapy:  Treat infection  Plan:   Zosyn 3.375 Gm IV q8h EI infusion  F/U SCr/cultures  Bruce [name] R 07/20/2014,2:13 AM

## 2014-07-20 NOTE — Progress Notes (Signed)
Pt hadn't voided this shift. Bladder scan results 340 ml.Encouraged and assisted pt to dangle at side of bed. Pt voided 200 mls. Will continue to monitor

## 2014-07-20 NOTE — Consult Note (Signed)
Reason for Consult:  SBO Referring Physician: Dr. Devona Konig Oncology:  Dr. Betsy Coder  [facility] Bruce [name] is an 53 y.o. male.  HPI: unfortunate gentleman who does not speak [place], but daughter is in the room and speaks [place].  He has hx of esophageal cancer and underwent Esophagectomy, transhiatal PYLOROPLASTY, VIDEO BRONCHOSCOPY, JEJUNOSTOMY and placement of feeding jujunostomy tube, 08/26/12.  He completed Chemo/radiation rx prior to surgery.  In [place] of this year he was diagnosed to have metastatic disease to the right lung, with VIDEO BRONCHOSCOPY,RIGHT VIDEO ASSISTEdTHORACOSCOPY/MINI-THORACOTOMY, RIGHT UPPER LOBE WEDGE RESECTION, LYMPH NODE DISSECTION, on 01/31/14, which confirmed this was a metastatic process and not a new lung cancer.  This is being followed by Dr. Benay Spice, but no new treatment at this point he is due for repeat CT scan in 2 months.   He has been stable at home and yesterday started having abdominal pain, followed over the next several hours with nausea and vomiting.  He was brought to the ED with CT showing ongoing right pleural effusion, Multiple dilated proximal SB loops distended, with transtion in the right mid abdomen.  Colon was normal.  We are ask to see.  He has an NG in and is not vomiting now.   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 Right pleural effusion 01/2014  Dysphasia    solid and liquid     Neuropathy    right hand numbness 1 year 2012     Reflux           Past Surgical History  Procedure Laterality Date  . 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 Eus  06/11/2012    Procedure: UPPER ENDOSCOPIC ULTRASOUND (EUS) RADIAL;  Surgeon: Milus Banister, MD;  Location: [facility];  Service: Endoscopy;  Laterality:  N/A;  . Complete esophagectomy  08/26/2012    Procedure: ESOPHAGECTOMY COMPLETE;  Surgeon: [name], MD;  Location: [place];  Service: Thoracic;  Laterality: N/A;  transhiatal   . Pyloroplasty  08/26/2012    Procedure: PYLOROPLASTY;  Surgeon: [name], MD;  Location: [place];  Service: Thoracic;  Laterality: N/A;  . Video bronchoscopy  08/26/2012    Procedure: VIDEO BRONCHOSCOPY;  Surgeon: [name], MD;  Location: [place];  Service: Thoracic;  Laterality: N/A;  . Jejunostomy  08/26/2012    Procedure: JEJUNOSTOMY;  Surgeon: [name], MD;  Location: [place];  Service: Thoracic;  Laterality: N/A;  placement of feeding jujunostomy tube  . Video bronchoscopy N/A 01/31/2014    Procedure: VIDEO BRONCHOSCOPY;  Surgeon: [name], MD;  Location: [facility] OR;  Service: Thoracic;  Laterality: N/A;  . Video assisted thoracoscopy (vats)/wedge resection Right 01/31/2014    Procedure: VIDEO ASSISTED THORACOSCOPY (VATS)/WEDGE RESECTION; with insertion of On Q pain pump;  Surgeon: [name], MD;  Location: [place];  Service: Thoracic;  Laterality: Right;  with insertion of On Q pain pump  . Lymph node dissection Right 01/31/2014    Procedure: LYMPH NODE DISSECTION;  Surgeon: [name], MD;  Location: [facility] OR;  Service: Thoracic;  Laterality: Right;    Family History  Problem Relation Age of Onset  . Breast cancer Sister     Social History:  reports that he quit smoking about 3  years ago. His smoking use included Cigarettes. He has a 10 pack-year smoking history. He quit smokeless tobacco use about 7 years ago. He reports that he does not drink alcohol or use illicit drugs.  Allergies: No Known Allergies  Medications:  Prior to Admission:  Prescriptions prior to admission  Medication Sig Dispense Refill  . pantoprazole (PROTONIX) 40 MG tablet Take 40 mg by mouth daily as needed (heart burn).       Scheduled: . sodium chloride   Intravenous STAT  . antiseptic oral  rinse  7 mL Mouth Rinse BID  . piperacillin-tazobactam (ZOSYN)  IV  3.375 g Intravenous Q8H   Continuous: . sodium chloride     MGQ:QPYPPJKDTOIZT (DILAUDID) injection, ondansetron (ZOFRAN) IV, ondansetron Anti-infectives   Start     Dose/Rate Route Frequency Ordered Stop   07/20/14 0230  piperacillin-tazobactam (ZOSYN) IVPB 3.375 g     3.375 g 12.5 mL/hr over 240 Minutes Intravenous Every 8 hours 07/20/14 0209        Results for orders placed during the hospital encounter of 07/19/14 (from the past 48 hour(s))  COMPREHENSIVE METABOLIC PANEL     Status: Abnormal   Collection Time    07/19/14  8:43 PM      Result Value Ref Range   Sodium 137  137 - 147 mEq/L   Potassium 5.2  3.7 - 5.3 mEq/L   Comment: MODERATE HEMOLYSIS     HEMOLYSIS AT THIS LEVEL MAY AFFECT RESULT   Chloride 96  96 - 112 mEq/L   CO2 21  19 - 32 mEq/L   Glucose, Bld 154 (*) 70 - 99 mg/dL   BUN 16  6 - 23 mg/dL   Creatinine, Ser 0.95  0.50 - 1.35 mg/dL   Calcium 10.8 (*) 8.4 - 10.5 mg/dL   Total Protein 9.3 (*) 6.0 - 8.3 g/dL   Albumin 4.8  3.5 - 5.2 g/dL   AST 49 (*) 0 - 37 U/L   Comment: MODERATE HEMOLYSIS     HEMOLYSIS AT THIS LEVEL MAY AFFECT RESULT   ALT 18  0 - 53 U/L   Comment: MODERATE HEMOLYSIS     HEMOLYSIS AT THIS LEVEL MAY AFFECT RESULT   Alkaline Phosphatase 124 (*) 39 - 117 U/L   Comment: MODERATE HEMOLYSIS     HEMOLYSIS AT THIS LEVEL MAY AFFECT RESULT   Total Bilirubin 0.6  0.3 - 1.2 mg/dL   GFR calc non Af Amer >90  >90 mL/min   GFR calc Af Amer >90  >90 mL/min   Comment: (NOTE)     The eGFR has been calculated using the CKD EPI equation.     This calculation has not been validated in all clinical situations.     eGFR's persistently <90 mL/min signify possible Chronic Kidney     Disease.   Anion gap 20 (*) 5 - 15  LIPASE, BLOOD     Status: None   Collection Time    07/19/14  8:43 PM      Result Value Ref Range   Lipase 15  11 - 59 U/L  CBC WITH DIFFERENTIAL     Status: Abnormal    Collection Time    07/19/14  8:43 PM      Result Value Ref Range   WBC 16.0 (*) 4.0 - 10.5 K/uL   RBC 5.49  4.22 - 5.81 MIL/uL   Hemoglobin 15.7  13.0 - 17.0 g/dL   HCT 44.9  39.0 - 52.0 %  MCV 81.8  78.0 - 100.0 fL   MCH 28.6  26.0 - 34.0 pg   MCHC 35.0  30.0 - 36.0 g/dL   RDW 15.0  11.5 - 15.5 %   Platelets 284  150 - 400 K/uL   Neutrophils Relative % 78 (*) 43 - 77 %   Neutro Abs 12.4 (*) 1.7 - 7.7 K/uL   Lymphocytes Relative 17  12 - 46 %   Lymphs Abs 2.7  0.7 - 4.0 K/uL   Monocytes Relative 5  3 - 12 %   Monocytes Absolute 0.9  0.1 - 1.0 K/uL   Eosinophils Relative 0  0 - 5 %   Eosinophils Absolute 0.0  0.0 - 0.7 K/uL   Basophils Relative 0  0 - 1 %   Basophils Absolute 0.0  0.0 - 0.1 K/uL  POC OCCULT BLOOD, ED     Status: None   Collection Time    07/19/14 11:46 PM      Result Value Ref Range   Fecal Occult Bld NEGATIVE  NEGATIVE  TSH     Status: None   Collection Time    07/20/14  4:00 AM      Result Value Ref Range   TSH 0.853  0.350 - 4.500 uIU/mL   Comment: Performed at [facility]    Dg Chest 2 View  07/19/2014   CLINICAL DATA:  ABDOMINAL PAIN  EXAM: CHEST - 2 VIEW  COMPARISON:  07/04/2014 and earlier studies  FINDINGS: Small right pleural effusion. Stable linear opacities in the right mid lung and to a lesser degree at both lung bases. Heart size remains normal.  Visualized skeletal structures are unremarkable.  IMPRESSION: 1. Stable right pleural effusion and linear pulmonary opacities. No acute abnormality.   Electronically Signed   By: Arne Cleveland M.D.   On: 07/19/2014 23:11   Ct Abdomen Pelvis W Contrast  07/19/2014   CLINICAL DATA:  ABDOMINAL PAIN  EXAM: CT ABDOMEN AND PELVIS WITH CONTRAST  TECHNIQUE: Multidetector CT imaging of the abdomen and pelvis was performed using the standard protocol following bolus administration of intravenous contrast.  CONTRAST:  122m OMNIPAQUE IOHEXOL 300 MG/ML  SOLN  COMPARISON:  07/04/2014 and earlier studies   FINDINGS: Persistent moderate right pleural effusion. Dependent atelectasis posteriorly in the visualized right lower lobe. Changes of gastric pull-through with distention of the visualized stomach in the lower chest. Multiple dilated proximal small bowel loops, with transition to more normal caliber distal small bowel in the right mid abdomen. No discrete etiology. The colon is nondistended. No ascites. No free air. No adenopathy identified. Portal vein patent. Unremarkable liver, gallbladder, spleen, adrenal glands, kidneys, pancreas. Urinary bladder incompletely distended. Moderate prostatic enlargement. Regional bones unremarkable.  IMPRESSION: 1. Mid small bowel obstruction without discrete mass or evident etiology, suggesting adhesions. 2. Stable moderate right pleural effusion.   Electronically Signed   By: DArne ClevelandM.D.   On: 07/19/2014 23:07    Review of Systems  Unable to perform ROS: language  Constitutional: Positive for weight loss (40 lbs since 2013 diagnosis, none since surgery in March.). Negative for fever and chills.       His daughter is in the room and we talked she ask questions and gave explanations.  HENT: Negative.   Eyes: Negative.   Respiratory: Positive for shortness of breath (since he has been in the hospital.).   Cardiovascular: Negative.   Gastrointestinal: Positive for nausea, vomiting and abdominal pain. Negative for diarrhea, constipation  and blood in stool.  Genitourinary: Negative.   Musculoskeletal: Negative.   Skin: Negative.   Neurological: Negative.   Endo/Heme/Allergies: Negative.   Psychiatric/Behavioral: Negative.    Blood pressure 121/80, pulse 84, temperature 98.9 F (37.2 C), temperature source Oral, resp. rate 20, height _0  (1.6 m), weight 57.5 kg (126 lb 12.2 oz), SpO2 96.00%. Physical Exam  Constitutional: He is oriented to person, place, and time.  Thin male, very uncomfortable, not vomiting but spitting up some blood tinged sputum.   HENT:  Head: Normocephalic and atraumatic.  NG in place  Eyes: EOM are normal. Pupils are equal, round, and reactive to light. Right eye exhibits no discharge. Left eye exhibits no discharge. No scleral icterus.  Neck: Normal range of motion. Neck supple. No JVD present. No tracheal deviation present. No thyromegaly present.  Cardiovascular: Regular rhythm, S1 normal, S2 normal and normal pulses.  Tachycardia present.  Exam reveals no gallop.   Respiratory: Effort normal and breath sounds normal. No respiratory distress. He has no wheezes. He has no rales. He exhibits no tenderness.  GI: Soft. Bowel sounds are normal. He exhibits distension. He exhibits no mass. There is no tenderness. There is no rebound and no guarding.  Musculoskeletal: He exhibits no edema.  Lymphadenopathy:    He has no cervical adenopathy.  Neurological: He is alert and oriented to person, place, and time. No cranial nerve deficit.  Skin: Skin is warm and dry. No rash noted. No erythema. No pallor.  Psychiatric: He has a normal mood and affect. His behavior is normal. Judgment and thought content normal.    Assessment/Plan: 1.  SBO, s/p esophagogastrectomy, pyloroplasty, jejunostomy in 08/26/12 for squamous cell cancer of the esophageus. 2.  New metastatic right lung squamous cell ca.  S/p minin thoracotomy and resection. 01/2014 3.  Dysphagia 4.  GERD  Plan:  Agree with NPO, bowel rest, hydration, and NG decompression.  He is a little SOB now, and has the pleura effusion either related to post thoracotomy, in March and or more metastatic disease.  I will recheck his film and we will follow with you. Recheck labs in AM also. Dene Nazir 07/20/2014, 8:56 AM

## 2014-07-20 NOTE — Progress Notes (Signed)
INITIAL NUTRITION ASSESSMENT  DOCUMENTATION CODES Per approved criteria  -Not Applicable   INTERVENTION: -Diet advancement per MD -Supplementation as warranted -Will continue to monitor  NUTRITION DIAGNOSIS: Inadequate oral intake related to inability to eat as evidenced by NPO status.   Goal: Pt to meet >/= 90% of their estimated nutrition needs    Monitor:  Diet order, total protein/energy intake, labs, weights, swallow profile  Reason for Assessment: MST  53 y.o. male  Admitting Dx: SBO (small bowel obstruction)  ASSESSMENT: Bruce [name] is a 53 y.o. male presents with abdominal pain. Patient has a history of esophageal carcinoma and has had surgery for resection of the esophagus. The surgery was performed about 2 years ago. Patient has not been currently getting any chemotherapy or radiation. He last ate at 11am and was OK at that time. He also reports having had a normal appearing bowel movement. Patient later in the day developed severe abdominal pain and had some vomiting also.  -Pt with hx of 40 lbs weight loss with esophageal cancer dx in 2013; however has been able to maintain weight around 125-130 lbs in past 6 months s/p thoracotomy and resection -Pt has hx of utilizing Jtube for tube feeding; however per discussion with daughter, pt was tolerating regular diet texture pta -Denied any changes in appetite. Was eating several meals/day. Denied use of nutritional supplements. Experienced one day of nausea, vomiting and abd pain pta, which did not significantly impact nutritional intake -Pt currently NPO d/t concern for SBO. NGT placed on suction with 400 ml output.  -GI recommended to continue with bowl rest, NG decompression and hydration -Daughter without nutrition concerns at this time, will continue to monitor diet advancement and supplement as warranted  Height: Ht Readings from Last 1 Encounters:  07/20/14 5\' 3"  (1.6 m)    Weight: Wt Readings from Last 1  Encounters:  07/20/14 126 lb 12.2 oz (57.5 kg)    Ideal Body Weight: 124 lbs  % Ideal Body Weight: 101%  Wt Readings from Last 10 Encounters:  07/20/14 126 lb 12.2 oz (57.5 kg)  07/04/14 126 lb 4.8 oz (57.289 kg)  05/26/14 128 lb (58.06 kg)  04/01/14 129 lb 3.2 oz (58.605 kg)  03/08/14 126 lb 3.2 oz (57.244 kg)  02/17/14 130 lb (58.968 kg)  02/01/14 128 lb 4.9 oz (58.2 kg)  02/01/14 128 lb 4.9 oz (58.2 kg)  01/28/14 126 lb 6 oz (57.323 kg)  01/24/14 131 lb (59.421 kg)    Usual Body Weight: 125-130 lbs  % Usual Body Weight: 100%  BMI:  Body mass index is 22.46 kg/(m^2).  Estimated Nutritional Needs: Kcal: 1550-1750 Protein: 60-75 gram Fluid: >/=1550 ml/daily  Skin: WDL  Diet Order: NPO  EDUCATION NEEDS: -No education needs identified at this time   Intake/Output Summary (Last 24 hours) at 07/20/14 1008 Last data filed at 07/20/14 0647  Gross per 24 hour  Intake    154 ml  Output    400 ml  Net   -246 ml    Last BM: 9/01   Labs:   Recent Labs Lab 07/19/14 2043  NA 137  K 5.2  CL 96  CO2 21  BUN 16  CREATININE 0.95  CALCIUM 10.8*  GLUCOSE 154*    CBG (last 3)   Recent Labs  07/20/14 0740  GLUCAP 247*    Scheduled Meds: . sodium chloride   Intravenous STAT  . antiseptic oral rinse  7 mL Mouth Rinse BID  . pantoprazole (  PROTONIX) IV  40 mg Intravenous Q12H  . piperacillin-tazobactam (ZOSYN)  IV  3.375 g Intravenous Q8H    Continuous Infusions: . sodium chloride      Past Medical History  Diagnosis Date  . Esophagitis   . Mass of esophagus 05/28/2012    BX'D DISTAL ESOPHAGUS,PENDING  . Dysphasia     solid and liquid  . Neuropathy     right hand numbness 1 year 2012  . 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 Lung cancer 12/27/13    right upper lung    Past Surgical History  Procedure Laterality Date  . Esophagogastroduodenoscopy  05/28/12    with biopsy mass distal esophagus ending ge junction  =invasiver  squamous cell ca  . Eus  06/11/2012    Procedure: UPPER ENDOSCOPIC ULTRASOUND (EUS) RADIAL;  Surgeon: Milus Banister, MD;  Location: [facility];  Service: Endoscopy;  Laterality: N/A;  . Complete esophagectomy  08/26/2012    Procedure: ESOPHAGECTOMY COMPLETE;  Surgeon: [name], MD;  Location: [place];  Service: Thoracic;  Laterality: N/A;  transhiatal   . Pyloroplasty  08/26/2012    Procedure: PYLOROPLASTY;  Surgeon: [name], MD;  Location: [place];  Service: Thoracic;  Laterality: N/A;  . Video bronchoscopy  08/26/2012    Procedure: VIDEO BRONCHOSCOPY;  Surgeon: [name], MD;  Location: [place];  Service: Thoracic;  Laterality: N/A;  . Jejunostomy  08/26/2012    Procedure: JEJUNOSTOMY;  Surgeon: [name], MD;  Location: [place];  Service: Thoracic;  Laterality: N/A;  placement of feeding jujunostomy tube  . Video bronchoscopy N/A 01/31/2014    Procedure: VIDEO BRONCHOSCOPY;  Surgeon: [name], MD;  Location: [facility] OR;  Service: Thoracic;  Laterality: N/A;  . Video assisted thoracoscopy (vats)/wedge resection Right 01/31/2014    Procedure: VIDEO ASSISTED THORACOSCOPY (VATS)/WEDGE RESECTION; with insertion of On Q pain pump;  Surgeon: [name], MD;  Location: [place];  Service: Thoracic;  Laterality: Right;  with insertion of On Q pain pump  . Lymph node dissection Right 01/31/2014    Procedure: LYMPH NODE DISSECTION;  Surgeon: [name], MD;  Location: [place];  Service: Thoracic;  Laterality: Right;    Atlee Abide MS RD LDN Clinical Dietitian ANVBT:[phone]

## 2014-07-21 ENCOUNTER — Inpatient Hospital Stay (HOSPITAL_COMMUNITY): Payer: BC Managed Care – PPO

## 2014-07-21 DIAGNOSIS — E875 Hyperkalemia: Secondary | ICD-10-CM

## 2014-07-21 LAB — GLUCOSE, RANDOM
GLUCOSE: 89 mg/dL (ref 70–99)
GLUCOSE: 101 mg/dL — AB (ref 70–99)
Glucose, Bld: 125 mg/dL — ABNORMAL HIGH (ref 70–99)

## 2014-07-21 LAB — COMPREHENSIVE METABOLIC PANEL
ALT: 13 U/L (ref 0–53)
AST: 21 U/L (ref 0–37)
Albumin: 3.6 g/dL (ref 3.5–5.2)
Alkaline Phosphatase: 55 U/L (ref 39–117)
Anion gap: 20 — ABNORMAL HIGH (ref 5–15)
BILIRUBIN TOTAL: 0.8 mg/dL (ref 0.3–1.2)
BUN: 65 mg/dL — ABNORMAL HIGH (ref 6–23)
CHLORIDE: 100 meq/L (ref 96–112)
CO2: 18 meq/L — AB (ref 19–32)
CREATININE: 4.3 mg/dL — AB (ref 0.50–1.35)
Calcium: 8.5 mg/dL (ref 8.4–10.5)
GFR calc Af Amer: 17 mL/min — ABNORMAL LOW (ref >90)
GFR, EST NON AFRICAN AMERICAN: 15 mL/min — AB (ref >90)
Glucose, Bld: 154 mg/dL — ABNORMAL HIGH (ref 70–99)
Potassium: 7.3 mEq/L (ref 3.7–5.3)
SODIUM: 138 meq/L (ref 137–147)
Total Protein: 8.2 g/dL (ref 6.0–8.3)

## 2014-07-21 LAB — GLUCOSE, CAPILLARY
GLUCOSE-CAPILLARY: 104 mg/dL — AB (ref 70–99)
GLUCOSE-CAPILLARY: 110 mg/dL — AB (ref 70–99)
Glucose-Capillary: 128 mg/dL — ABNORMAL HIGH (ref 70–99)
Glucose-Capillary: 108 mg/dL — ABNORMAL HIGH (ref 70–99)
Glucose-Capillary: 96 mg/dL (ref 70–99)

## 2014-07-21 LAB — CBC
HCT: 47.2 % (ref 39.0–52.0)
Hemoglobin: 15.9 g/dL (ref 13.0–17.0)
MCH: 28.1 pg (ref 26.0–34.0)
MCHC: 33.7 g/dL (ref 30.0–36.0)
MCV: 83.5 fL (ref 78.0–100.0)
Platelets: 228 10*3/uL (ref 150–400)
RBC: 5.65 MIL/uL (ref 4.22–5.81)
RDW: 15.7 % — ABNORMAL HIGH (ref 11.5–15.5)
WBC: 2.8 10*3/uL — ABNORMAL LOW (ref 4.0–10.5)

## 2014-07-21 LAB — POTASSIUM
POTASSIUM: 5.3 meq/L (ref 3.7–5.3)
POTASSIUM: 5.4 meq/L — AB (ref 3.7–5.3)
Potassium: 6.3 mEq/L — ABNORMAL HIGH (ref 3.7–5.3)
Potassium: 5 mEq/L (ref 3.7–5.3)

## 2014-07-21 LAB — PROTIME-INR
INR: 1.13 (ref 0.00–1.49)
Prothrombin Time: 14.5 seconds (ref 11.6–15.2)

## 2014-07-21 LAB — APTT: aPTT: 40 seconds — ABNORMAL HIGH (ref 24–37)

## 2014-07-21 MED ORDER — FUROSEMIDE 10 MG/ML IJ SOLN
20.0000 mg | Freq: Once | INTRAMUSCULAR | Status: AC
  Administered 2014-07-21: 20 mg via INTRAVENOUS
  Filled 2014-07-21: qty 2

## 2014-07-21 MED ORDER — DEXTROSE 50 % IV SOLN
50.0000 mL | Freq: Once | INTRAVENOUS | Status: AC
  Administered 2014-07-21: 50 mL via INTRAVENOUS
  Filled 2014-07-21: qty 50

## 2014-07-21 MED ORDER — DEXTROSE 50 % IV SOLN
1.0000 | Freq: Once | INTRAVENOUS | Status: AC
  Administered 2014-07-21: 50 mL via INTRAVENOUS
  Filled 2014-07-21: qty 50

## 2014-07-21 MED ORDER — INSULIN ASPART 100 UNIT/ML ~~LOC~~ SOLN
10.0000 [IU] | Freq: Once | SUBCUTANEOUS | Status: AC
  Administered 2014-07-21: 10 [IU] via SUBCUTANEOUS

## 2014-07-21 MED ORDER — INSULIN ASPART 100 UNIT/ML IV SOLN
10.0000 [IU] | Freq: Once | INTRAVENOUS | Status: AC
  Administered 2014-07-21: 10 [IU] via INTRAVENOUS

## 2014-07-21 MED ORDER — DEXTROSE 50 % IV SOLN
1.0000 | Freq: Once | INTRAVENOUS | Status: DC
Start: 2014-07-21 — End: 2014-07-21
  Filled 2014-07-21: qty 50

## 2014-07-21 MED ORDER — DEXTROSE 50 % IV SOLN
1.0000 | Freq: Once | INTRAVENOUS | Status: DC
  Filled 2014-07-21: qty 50

## 2014-07-21 MED ORDER — SODIUM CHLORIDE 0.9 % IV SOLN
1.0000 g | Freq: Once | INTRAVENOUS | Status: AC
  Administered 2014-07-21: 1 g via INTRAVENOUS
  Filled 2014-07-21: qty 10

## 2014-07-21 NOTE — Progress Notes (Signed)
Subjective: He feels better, but film still shows obstruction.  No free air.  His NG was hooked up incorrectly and I have fixed that.  I also instructed the nurse on this AM.  His renal function is the big issue this AM and Dr. Charlies Silvers is treating this  Objective: Vital signs in last 24 hours: Temp:  [98.3 F (36.8 C)-99.2 F (37.3 C)] 99.2 F (37.3 C) (09/03 0522) Pulse Rate:  [82-129] 116 (09/03 0522) Resp:  [16-18] 18 (09/03 0522) BP: (99-118)/(66-83) 117/83 mmHg (09/03 0522) SpO2:  [95 %-98 %] 98 % (09/03 0522) Last BM Date: 07/19/14 350 from the NG recorded.3 Afebrile, VSS HR is up Pt is developing acute renal failure creatinine is up to 4.3 and K+ up to 7.3 WBC is down to 2.8 Intake/Output from previous day: 09/02 0701 - 09/03 0700 In: 1050 [I.V.:1000; IV Piggyback:50] Out: 550 [Urine:200; Emesis/NG output:350] Intake/Output this shift:    General appearance: alert, cooperative, no distress and feels better than yesterday. Resp: clear to auscultation bilaterally GI: soft, less distended than yesterday, pain is much better.  BS are still present, hypoactive and high pitched.  Lab Results:   Recent Labs  07/19/14 2043 07/21/14 0500  WBC 16.0* 2.8*  HGB 15.7 15.9  HCT 44.9 47.2  PLT 284 228    BMET  Recent Labs  07/19/14 2043 07/21/14 0500 07/21/14 0923  NA 137 138  --   K 5.2 7.3* 6.3*  CL 96 100  --   CO2 21 18*  --   GLUCOSE 154* 154*  --   BUN 16 65*  --   CREATININE 0.95 4.30*  --   CALCIUM 10.8* 8.5  --    PT/INR  Recent Labs  07/21/14 0500  LABPROT 14.5  INR 1.13     Recent Labs Lab 07/19/14 2043 07/21/14 0500  AST 49* 21  ALT 18 13  ALKPHOS 124* 55  BILITOT 0.6 0.8  PROT 9.3* 8.2  ALBUMIN 4.8 3.6     Lipase     Component Value Date/Time   LIPASE 15 07/19/2014 2043     Studies/Results: Dg Chest 2 View  07/19/2014   CLINICAL DATA:  ABDOMINAL PAIN  EXAM: CHEST - 2 VIEW  COMPARISON:  07/04/2014 and earlier studies   FINDINGS: Small right pleural effusion. Stable linear opacities in the right mid lung and to a lesser degree at both lung bases. Heart size remains normal.  Visualized skeletal structures are unremarkable.  IMPRESSION: 1. Stable right pleural effusion and linear pulmonary opacities. No acute abnormality.   Electronically Signed   By: Arne Cleveland M.D.   On: 07/19/2014 23:11   Dg Abd 1 View  07/20/2014   CLINICAL DATA:  Abdominal pain, evaluate small bowel obstruction  EXAM: ABDOMEN - 1 VIEW  COMPARISON:  CT abdomen pelvis - 07/19/2014  FINDINGS: There is persistent moderate to marked gas distention of multiple loops of small bowel with index loop measuring approximately 5 cm in diameter. There is a conspicuous paucity of distal colonic gas. No supine evidence of pneumoperitoneum, though note, the left lower thorax excluded from view. No definite pneumatosis or portal venous gas.  Excreted contrast is seen within the urinary bladder.  Enteric tube side port projects over the distal esophagus.  No acute osseus abnormalities.  IMPRESSION: 1. Similar findings suggestive of high-grade small bowel obstruction. 2. Enteric tube side port overlies the distal esophagus. Advancement at least 8 cm is recommended.   Electronically Signed  By: Sandi Mariscal M.D.   On: 07/20/2014 11:44   Ct Abdomen Pelvis W Contrast  07/19/2014   CLINICAL DATA:  ABDOMINAL PAIN  EXAM: CT ABDOMEN AND PELVIS WITH CONTRAST  TECHNIQUE: Multidetector CT imaging of the abdomen and pelvis was performed using the standard protocol following bolus administration of intravenous contrast.  CONTRAST:  154mL OMNIPAQUE IOHEXOL 300 MG/ML  SOLN  COMPARISON:  07/04/2014 and earlier studies  FINDINGS: Persistent moderate right pleural effusion. Dependent atelectasis posteriorly in the visualized right lower lobe. Changes of gastric pull-through with distention of the visualized stomach in the lower chest. Multiple dilated proximal small bowel loops, with  transition to more normal caliber distal small bowel in the right mid abdomen. No discrete etiology. The colon is nondistended. No ascites. No free air. No adenopathy identified. Portal vein patent. Unremarkable liver, gallbladder, spleen, adrenal glands, kidneys, pancreas. Urinary bladder incompletely distended. Moderate prostatic enlargement. Regional bones unremarkable.  IMPRESSION: 1. Mid small bowel obstruction without discrete mass or evident etiology, suggesting adhesions. 2. Stable moderate right pleural effusion.   Electronically Signed   By: Arne Cleveland M.D.   On: 07/19/2014 23:07   Dg Abd 2 Views  07/21/2014   CLINICAL DATA:  Small bowel obstruction, followup  EXAM: ABDOMEN - 2 VIEW  COMPARISON:  Abdomen film of 07/20/2014 and CT abdomen pelvis of 07/19/2014  FINDINGS: There is persistent small bowel dilatation measuring up to 4.9 cm consistent with persistent small bowel obstruction. No free air is seen on the left lateral decubitus of the abdomen. No bowel gas is seen within the colon. Scattered air-fluid levels are noted on the decubitus film.  IMPRESSION: Persistent high-grade small bowel obstruction.  No free air.   Electronically Signed   By: Ivar Drape M.D.   On: 07/21/2014 08:15    Medications: . antiseptic oral rinse  7 mL Mouth Rinse BID  . pantoprazole (PROTONIX) IV  40 mg Intravenous Q12H    Assessment/Plan 1. SBO, s/p esophagogastrectomy, pyloroplasty, jejunostomy in 08/26/12 for squamous cell cancer of the esophageus.  2. New metastatic right lung squamous cell ca. S/p minin thoracotomy and resection. 01/2014  3. Dysphagia  4. GERD 5.  Acute renal failure and hyperkalemia 6.  New leukopenia     Plan:  He is going to telem, Dr. Charlies Silvers is treating his hyperkalemia.  Continue NG and ice chips only for now.   LOS: 2 days    [place]unetta Riggle 07/21/2014

## 2014-07-21 NOTE — Progress Notes (Signed)
CRITICAL VALUE ALERT  Critical value received:  K+ 7.3  Date of notification:  07/21/14  Time of notification:  0722  Critical value read back:Yes.    Nurse who received alert:  Laural Benes, RN  MD notified (1st page):  Dr.Devine  Time of first page:  0725  MD notified (2nd page):  Time of second page:  Responding MD:  Dr. Charlies Silvers  Time MD responded:  [id number]

## 2014-07-21 NOTE — Progress Notes (Signed)
Report called to Radonna Ricker, RN on [place].

## 2014-07-21 NOTE — Progress Notes (Signed)
Patient ID: Bruce [name], male   DOB: 12-14-1960, 53 y.o.   MRN: [id number] [facility] PROGRESS NOTE  Arav Bannister ACZ:[id number] DOB: 1961-08-20 DOA: 07/19/2014 PCP: No PCP Per Patient  Brief narrative: 53 y.o. 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 CT abdomen showed small bowel obstruction due to adhesions.   Assessment/Plan:   Principal Problem:  SBO (small bowel obstruction)  Secondary to adhesions. NG tube in place. Patient feels better this morning. Continue supportive care with IV fluids, analgesia and antiemetics. Appreciate surgery following  Patient was initially on Zosyn but this was stopped 07/20/2014, no evidence of intra-abdominal infection. Active Problems:  Hyperkalemia  Potassium 7 this am. Given lasix 20 mg IV, insulin 10 units and D50 1 ampule. This reduced potassium to 6.3. Repeat the same lasix, insulin, D50.  Follow up potassium in 1 hour after the treatment.  Obtain 12 lead EKG  Transfer to telemetry.  Malignant neoplasm of lower third of esophagus   Per oncology Leukocytosis / leukopenia  Likely stress de-margination. Repeat blood work shows leukopenia. Continue to monitor CBC. DVT Prophylaxis  SCD's bilaterally    Code Status: Full.  Family Communication: plan of care discussed with the patient and his family at the bedside. Disposition Plan: Home when  stable. Transfer to telemetry.    IV Access:   Peripheral IV Procedures and diagnostic studies:   Dg Chest 2 View 07/19/2014 1. Stable right pleural effusion and linear pulmonary opacities. No acute abnormality.  Ct Abdomen Pelvis W Contrast 07/19/2014 1. Mid small bowel obstruction without discrete mass or evident etiology, suggesting adhesions. 2. Stable moderate right pleural effusion.  Dg Abd 1 View 07/20/2014   1. Similar findings suggestive of high-grade small bowel obstruction. 2. Enteric tube side port overlies the distal esophagus. Advancement at least 8 cm is recommended.    Dg Abd 2 Views 07/21/2014   Persistent high-grade small bowel obstruction.  No free air. Medical Consultants:   Surgery  Other Consultants:   Nutrition  Anti-Infectives:   None    Leisa Lenz, MD  [facility] Pager [phone]  If 7PM-7AM, please contact night-coverage [email or web address] Password [email or web address] 07/21/2014, 11:05 AM   LOS: 2 days    HPI/Subjective: No acute overnight events.  Objective: Filed Vitals:   07/20/14 2106 07/20/14 2115 07/21/14 0522 07/21/14 1044  BP: 99/66  117/83 118/84  Pulse: 129 103 116 112  Temp: 98.3 F (36.8 C)  99.2 F (37.3 C) 98 F (36.7 C)  TempSrc: Oral  Oral Oral  Resp: 16  18 18   Height:    5\' 3"  (1.6 m)  Weight:    57.7 kg (127 lb 3.3 oz)  SpO2: 96%  98% 97%    Intake/Output Summary (Last 24 hours) at 07/21/14 1105 Last data filed at 07/21/14 1000  Gross per 24 hour  Intake 1901.25 ml  Output    925 ml  Net 976.25 ml    Exam:   General:  Pt is alert, follows commands appropriately, not  in acute distress  Cardiovascular: NG tube in place, S1/S appreciated   Respiratory: bilateral air entry, no wheezing   Abdomen:  Distended, some tenderness in mid abdomen, bowel sounds present  Extremities: No edema, pulses DP and PT palpable bilaterally  Neuro: Grossly nonfocal  Data Reviewed: Basic Metabolic Panel:  Recent Labs Lab 07/19/14 2043  07/21/14 0500 07/21/14 0923  NA 137 138  --   K 5.2 7.3* 6.3*  CL 96 100  --   CO2 21 18*  --   GLUCOSE 154* 154*  --   BUN 16 65*  --   CREATININE 0.95 4.30*  --   CALCIUM 10.8* 8.5  --    Liver Function Tests:  Recent Labs Lab 07/19/14 2043 07/21/14 0500  AST 49* 21  ALT 18 13  ALKPHOS 124* 55  BILITOT 0.6 0.8  PROT 9.3* 8.2  ALBUMIN 4.8 3.6    Recent Labs Lab 07/19/14 2043  LIPASE 15   No results found for this basename: AMMONIA,  in the last 168 hours CBC:  Recent Labs Lab 07/19/14 2043 07/21/14 0500  WBC 16.0* 2.8*  NEUTROABS 12.4*  --   HGB 15.7 15.9  HCT 44.9 47.2  MCV 81.8 83.5  PLT 284 228   Cardiac Enzymes: No results found for this basename: CKTOTAL, CKMB, CKMBINDEX, TROPONINI,  in the last 168 hours BNP: No components found with this basename: POCBNP,  CBG:  Recent Labs Lab 07/20/14 0740 07/21/14 0747  GLUCAP 247* 128*    No results found for this or any previous visit (from the past 240 hour(s)).   Scheduled Meds: . antiseptic oral rinse  7 mL Mouth Rinse BID  . dextrose  1 ampule Intravenous Once  . furosemide  20 mg Intravenous Once  . insulin aspart  10 Units Intravenous Once  . pantoprazole (PROTONIX) IV  40 mg Intravenous Q12H   Continuous Infusions: . sodium chloride 75 mL/hr at 07/21/14 0047

## 2014-07-21 NOTE — Progress Notes (Signed)
General Surgery [facility]  Patient seen and examined.  Daughter at bedside.  Patient notes less pain, no emesis this AM.  AXR from this AM shows persistent dilated loops of small bowel.  No free air.  Some gas in colon.  Less distended on exam, mild tenderness. NG with small bilious output this AM.  Continue NG decompression.  Encouraged ambulation.  Earnstine Regal, MD, [facility] Office: [phone]

## 2014-07-22 ENCOUNTER — Inpatient Hospital Stay (HOSPITAL_COMMUNITY): Payer: BC Managed Care – PPO

## 2014-07-22 LAB — URINE MICROSCOPIC-ADD ON

## 2014-07-22 LAB — BASIC METABOLIC PANEL
Anion gap: 23 — ABNORMAL HIGH (ref 5–15)
BUN: 90 mg/dL — ABNORMAL HIGH (ref 6–23)
CO2: 17 mEq/L — ABNORMAL LOW (ref 19–32)
CREATININE: 6.24 mg/dL — AB (ref 0.50–1.35)
Calcium: 9.1 mg/dL (ref 8.4–10.5)
Chloride: 102 mEq/L (ref 96–112)
GFR, EST AFRICAN AMERICAN: 11 mL/min — AB (ref >90)
GFR, EST NON AFRICAN AMERICAN: 9 mL/min — AB (ref >90)
Glucose, Bld: 87 mg/dL (ref 70–99)
Potassium: 6.5 mEq/L (ref 3.7–5.3)
Sodium: 142 mEq/L (ref 137–147)

## 2014-07-22 LAB — CBC
HEMATOCRIT: 44.5 % (ref 39.0–52.0)
Hemoglobin: 15.7 g/dL (ref 13.0–17.0)
MCH: 28.7 pg (ref 26.0–34.0)
MCHC: 35.3 g/dL (ref 30.0–36.0)
MCV: 81.4 fL (ref 78.0–100.0)
Platelets: 209 10*3/uL (ref 150–400)
RBC: 5.47 MIL/uL (ref 4.22–5.81)
RDW: 15.6 % — AB (ref 11.5–15.5)
WBC: 6.3 10*3/uL (ref 4.0–10.5)

## 2014-07-22 LAB — POTASSIUM: POTASSIUM: 5.5 meq/L — AB (ref 3.7–5.3)

## 2014-07-22 LAB — LACTIC ACID, PLASMA: Lactic Acid, Venous: 1.6 mmol/L (ref 0.5–2.2)

## 2014-07-22 LAB — URINALYSIS, ROUTINE W REFLEX MICROSCOPIC
Bilirubin Urine: NEGATIVE
Glucose, UA: NEGATIVE mg/dL
KETONES UR: NEGATIVE mg/dL
Nitrite: NEGATIVE
PH: 5 (ref 5.0–8.0)
Protein, ur: 100 mg/dL — AB
Specific Gravity, Urine: 1.017 (ref 1.005–1.030)
Urobilinogen, UA: 0.2 mg/dL (ref 0.0–1.0)

## 2014-07-22 LAB — RENAL FUNCTION PANEL
ALBUMIN: 3 g/dL — AB (ref 3.5–5.2)
Anion gap: 25 — ABNORMAL HIGH (ref 5–15)
BUN: 97 mg/dL — AB (ref 6–23)
CALCIUM: 9.2 mg/dL (ref 8.4–10.5)
CHLORIDE: 101 meq/L (ref 96–112)
CO2: 17 mEq/L — ABNORMAL LOW (ref 19–32)
Creatinine, Ser: 6.52 mg/dL — ABNORMAL HIGH (ref 0.50–1.35)
GFR calc Af Amer: 10 mL/min — ABNORMAL LOW (ref >90)
GFR calc non Af Amer: 9 mL/min — ABNORMAL LOW (ref >90)
Glucose, Bld: 111 mg/dL — ABNORMAL HIGH (ref 70–99)
PHOSPHORUS: 8.4 mg/dL — AB (ref 2.3–4.6)
Potassium: 5.8 mEq/L — ABNORMAL HIGH (ref 3.7–5.3)
Sodium: 143 mEq/L (ref 137–147)

## 2014-07-22 LAB — CREATININE, URINE, RANDOM: Creatinine, Urine: 84.33 mg/dL

## 2014-07-22 LAB — GLUCOSE, CAPILLARY: Glucose-Capillary: 82 mg/dL (ref 70–99)

## 2014-07-22 LAB — URIC ACID: Uric Acid, Serum: 12.8 mg/dL — ABNORMAL HIGH (ref 4.0–7.8)

## 2014-07-22 LAB — LACTATE DEHYDROGENASE: LDH: 234 U/L (ref 94–250)

## 2014-07-22 LAB — CK: CK TOTAL: 153 U/L (ref 7–232)

## 2014-07-22 LAB — SODIUM, URINE, RANDOM: Sodium, Ur: 59 mEq/L

## 2014-07-22 MED ORDER — STERILE WATER FOR INJECTION IV SOLN
INTRAVENOUS | Status: DC
  Administered 2014-07-22 – 2014-07-24 (×4): via INTRAVENOUS
  Filled 2014-07-22 (×8): qty 850

## 2014-07-22 MED ORDER — SODIUM POLYSTYRENE SULFONATE 15 GM/60ML PO SUSP
60.0000 g | Freq: Once | ORAL | Status: AC
  Administered 2014-07-22: 60 g via RECTAL
  Filled 2014-07-22: qty 240

## 2014-07-22 NOTE — Progress Notes (Signed)
CRITICAL VALUE ALERT  Critical value received: K+ 6.5 with no hemolysis  Date of notification: 07/22/2014   Time of notification:  1106  Critical value read back: yes  Nurse who received alert:  Kaylyn Layer, communicated value to bedside RN and paged MD. Bedside RN to follow up with MD.   MD notified (1st page): Dr. Neil Crouch  Time of first page:  1107  MD notified (2nd page):  Time of second page:  Responding MD:    Time MD responded:

## 2014-07-22 NOTE — Care Management Note (Addendum)
    Page 1 of 1   07/26/2014     12:09:26 PM CARE MANAGEMENT NOTE 07/26/2014  Patient:  Bruce [name], Bruce [name] [facility]   Account Number:  [id number]  Date Initiated:  07/22/2014  Documentation initiated by:  Dessa Phi  Subjective/Objective Assessment:   53 Y/O M ADMITTED W/SBO.     Action/Plan:   SPEAKS [place]ESE.FROM HOME W/DTR(SPEAKS ENGLISH)   Anticipated DC Date:  07/26/2014   Anticipated DC Plan:  HOME/SELF CARE  In-house referral  Interpreting Services      DC Planning Services  CM consult      Choice offered to / List presented to:             Status of service:  Completed, signed off Medicare Important Message given?   (If response is "NO", the following Medicare IM given date fields will be blank) Date Medicare IM given:   Medicare IM given by:   Date Additional Medicare IM given:   Additional Medicare IM given by:    Discharge Disposition:  HOME/SELF CARE  Per UR Regulation:  Reviewed for med. necessity/level of care/duration of stay  If discussed at [place] of Stay Meetings, dates discussed:   07/26/2014    Comments:  07/26/14 Damiel Barthold RN,BSN NCM [phone] D/C HOME NO NEEDS OR ORDERS.  07/25/14 Merleen Picazo RN,BSN NCM [place],ADVANCING DIET,AWAIT BOWEL FXN.NO D/C NEEDS.  07/22/14 Zarin Hagmann RN,BSN NCM [phone] NGT,ABD XRAY TODAY.HAS TELEPHONIC INTERPRETING SERVICES SET UP.DTR SPEAKS ENGLISH.NO ANTICIPATED D/C NEEDS.

## 2014-07-22 NOTE — Progress Notes (Signed)
Patient ambulated the entire hall and back without trouble or additional pain. Will continue to monitor.

## 2014-07-22 NOTE — Progress Notes (Signed)
General Surgery [facility]  Patient seen and examined.  Family at bedside to interpret.  AXR improved.  Patient states he's passing flatus, no BM.  Less pain.  Continue NG decompression today.  Will advance slowly.  Earnstine Regal, MD, [facility] Office: [phone]

## 2014-07-22 NOTE — Progress Notes (Signed)
Subjective: He looks better, abdomen softer, less distended, + BS, some flatus.  Complains of throat being dry and sore.  Thick sputum, I don't see labs for this AM and none drawn.  Objective: Vital signs in last 24 hours: Temp:  [97.6 F (36.4 C)-99.1 F (37.3 C)] 99.1 F (37.3 C) (09/04 0647) Pulse Rate:  [111-120] 120 (09/04 0406) Resp:  [15-28] 15 (09/04 0406) BP: (118-140)/(76-90) 140/90 mmHg (09/04 0406) SpO2:  [94 %-97 %] 97 % (09/04 0406) Weight:  [57.7 kg (127 lb 3.3 oz)] 57.7 kg (127 lb 3.3 oz) (09/03 1044) Last BM Date: 07/21/14 150 from NG recorded Afebrile, tachycardic BP stable No labs Film today shows improvement in SBO Intake/Output from previous day: 09/03 0701 - 09/04 0700 In: 2456.3 [I.V.:2266.3; NG/GT:30; IV Piggyback:160] Out: 2525 [Urine:2375; Emesis/NG output:150] Intake/Output this shift: Total I/O In: -  Out: 425 [Urine:425]  General appearance: alert, cooperative and no distress GI: soft, not tender, few BS.  no distension .  Lab Results:   Recent Labs  07/19/14 2043 07/21/14 0500  WBC 16.0* 2.8*  HGB 15.7 15.9  HCT 44.9 47.2  PLT 284 228    BMET  Recent Labs  07/19/14 2043 07/21/14 0500  07/21/14 1613 07/21/14 1940  NA 137 138  --   --   --   K 5.2 7.3*  < > 5.4* 5.0  CL 96 100  --   --   --   CO2 21 18*  --   --   --   GLUCOSE 154* 154*  < > 101* 125*  BUN 16 65*  --   --   --   CREATININE 0.95 4.30*  --   --   --   CALCIUM 10.8* 8.5  --   --   --   < > = values in this interval not displayed. PT/INR  Recent Labs  07/21/14 0500  LABPROT 14.5  INR 1.13     Recent Labs Lab 07/19/14 2043 07/21/14 0500  AST 49* 21  ALT 18 13  ALKPHOS 124* 55  BILITOT 0.6 0.8  PROT 9.3* 8.2  ALBUMIN 4.8 3.6     Lipase     Component Value Date/Time   LIPASE 15 07/19/2014 2043     Studies/Results: Dg Abd 1 View  07/20/2014   CLINICAL DATA:  Abdominal pain, evaluate small bowel obstruction  EXAM: ABDOMEN - 1 VIEW   COMPARISON:  CT abdomen pelvis - 07/19/2014  FINDINGS: There is persistent moderate to marked gas distention of multiple loops of small bowel with index loop measuring approximately 5 cm in diameter. There is a conspicuous paucity of distal colonic gas. No supine evidence of pneumoperitoneum, though note, the left lower thorax excluded from view. No definite pneumatosis or portal venous gas.  Excreted contrast is seen within the urinary bladder.  Enteric tube side port projects over the distal esophagus.  No acute osseus abnormalities.  IMPRESSION: 1. Similar findings suggestive of high-grade small bowel obstruction. 2. Enteric tube side port overlies the distal esophagus. Advancement at least 8 cm is recommended.   Electronically Signed   By: Sandi Mariscal M.D.   On: 07/20/2014 11:44   Dg Abd 2 Views  07/22/2014   CLINICAL DATA:  Followup small bowel obstruction.  EXAM: ABDOMEN - 2 VIEW  COMPARISON:  07/21/2014  FINDINGS: NG tube tip is in the mid stomach. Decreasing small bowel distention PA continued mild small bowel prominence in the left upper quadrant. No organomegaly or  free air. No suspicious calcification.  IMPRESSION: Improving small bowel obstruction pattern with NG tube in place.   Electronically Signed   By: Rolm Baptise M.D.   On: 07/22/2014 08:00   Dg Abd 2 Views  07/21/2014   CLINICAL DATA:  Small bowel obstruction, followup  EXAM: ABDOMEN - 2 VIEW  COMPARISON:  Abdomen film of 07/20/2014 and CT abdomen pelvis of 07/19/2014  FINDINGS: There is persistent small bowel dilatation measuring up to 4.9 cm consistent with persistent small bowel obstruction. No free air is seen on the left lateral decubitus of the abdomen. No bowel gas is seen within the colon. Scattered air-fluid levels are noted on the decubitus film.  IMPRESSION: Persistent high-grade small bowel obstruction.  No free air.   Electronically Signed   By: Ivar Drape M.D.   On: 07/21/2014 08:15    Medications: . antiseptic oral rinse   7 mL Mouth Rinse BID  . pantoprazole (PROTONIX) IV  40 mg Intravenous Q12H    Assessment/Plan 1. SBO, s/p esophagogastrectomy, pyloroplasty, jejunostomy in 08/26/12 for squamous cell cancer of the esophageus.  2. New metastatic right lung squamous cell ca. S/p minin thoracotomy and resection. 01/2014  3. Dysphagia  4. GERD  5. Acute renal failure and hyperkalemia  6. New leukopenia   Plan:  Continue suction today, ice chips and sips today.  I will order AM labs. If he continues to do well clamping trials tomorrow.  LOS: 3 days    Muhammed Teutsch 07/22/2014

## 2014-07-22 NOTE — Progress Notes (Signed)
Dr. Doyle Askew ordered foley insertion and kexelate to be given for elevated Potassium.

## 2014-07-22 NOTE — Consult Note (Signed)
Reason for Consult:AKI Referring Physician: Dr. Percell Boston Bruce [name] is an 53 y.o. male.  HPI: 53 yr male with hx esophageal Ca diagnosed 2013, had esophagectomy and pull through, chemotx, radrx.  Subsequent recurrence with lung met and had resx/LN dissection 3/15.  Presented 3 d ago with 2 d of N,V, abdm pain and found to have SBO Underwent IV contrasted CT to eval. Baseline Cr .8 , and rose to 4.3 and today 6.25.  Severe hyperkalemia for over 24 h now tx with temporizing measures of glu/insulin, IV Ca, and Lasix.  Has been relatively oliguric and has low bicarb. At our request, has gotten foley (bladder scan with >300 cc), Kayexalate enema.        No hx renal dz, UTIs, stones,or FH of renal dz (daughter interpreter).  No NSAIDS or ACEI. Constitutional: feels bad Eyes: negative Ears, nose, mouth, throat, and face: negative Respiratory: negative Cardiovascular: negative Gastrointestinal: as above , some ? if has started passing gas Genitourinary:see above, no prior sx Integument/breast: negative Musculoskeletal:negative Allergic/Immunologic: negative   Past Medical History  Diagnosis Date  . Esophagitis   . Mass of esophagus 05/28/2012    BX'D DISTAL ESOPHAGUS,PENDING  . Dysphasia     solid and liquid  . Neuropathy     right hand numbness 1 year 2012  . 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 Lung cancer 12/27/13    right upper lung    Past Surgical History  Procedure Laterality Date  . 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 Eus  06/11/2012    Procedure: UPPER ENDOSCOPIC ULTRASOUND (EUS) RADIAL;  Surgeon: Milus Banister, MD;  Location: [facility];  Service: Endoscopy;  Laterality: N/A;  . Complete esophagectomy  08/26/2012    Procedure: ESOPHAGECTOMY COMPLETE;  Surgeon: [name], MD;  Location: [place];  Service: Thoracic;  Laterality: N/A;  transhiatal   .  Pyloroplasty  08/26/2012    Procedure: PYLOROPLASTY;  Surgeon: [name], MD;  Location: [place];  Service: Thoracic;  Laterality: N/A;  . Video bronchoscopy  08/26/2012    Procedure: VIDEO BRONCHOSCOPY;  Surgeon: [name], MD;  Location: [place];  Service: Thoracic;  Laterality: N/A;  . Jejunostomy  08/26/2012    Procedure: JEJUNOSTOMY;  Surgeon: [name], MD;  Location: [place];  Service: Thoracic;  Laterality: N/A;  placement of feeding jujunostomy tube  . Video bronchoscopy N/A 01/31/2014    Procedure: VIDEO BRONCHOSCOPY;  Surgeon: [name], MD;  Location: [facility] OR;  Service: Thoracic;  Laterality: N/A;  . Video assisted thoracoscopy (vats)/wedge resection Right 01/31/2014    Procedure: VIDEO ASSISTED THORACOSCOPY (VATS)/WEDGE RESECTION; with insertion of On Q pain pump;  Surgeon: [name], MD;  Location: [place];  Service: Thoracic;  Laterality: Right;  with insertion of On Q pain pump  . Lymph node dissection Right 01/31/2014    Procedure: LYMPH NODE DISSECTION;  Surgeon: [name], MD;  Location[facility];  Service: Thoracic;  Laterality: Right;    Family History  Problem Relation Age of Onset  . Breast cancer Sister     Social History:  reports that he quit smoking about 3 years ago. His smoking use included Cigarettes. He has a 10 pack-year smoking history. He quit smokeless tobacco use about 7 years ago. He reports that he does not drink alcohol or use illicit drugs.  Allergies: No Known Allergies    Results for orders placed during the hospital encounter of 07/19/14 (from the past 48 hour(s))  CBC     Status: Abnormal   Collection Time    07/21/14  5:00 AM      Result Value Ref Range   WBC 2.8 (*) 4.0 - 10.5 K/uL   Comment: WHITE COUNT CONFIRMED ON SMEAR   RBC 5.65  4.22 - 5.81 MIL/uL   Hemoglobin 15.9  13.0 - 17.0 g/dL   HCT 47.2  39.0 - 52.0 %   MCV 83.5  78.0 - 100.0 fL   MCH 28.1  26.0 - 34.0 pg   MCHC 33.7  30.0 - 36.0 g/dL   RDW  15.7 (*) 11.5 - 15.5 %   Platelets 228  150 - 400 K/uL  COMPREHENSIVE METABOLIC PANEL     Status: Abnormal   Collection Time    07/21/14  5:00 AM      Result Value Ref Range   Sodium 138  137 - 147 mEq/[name]   Potassium 7.3 (*) 3.7 - 5.3 mEq/[name]   Comment: DELTA CHECK NOTED     NO VISIBLE HEMOLYSIS     REPEATED TO VERIFY     CRITICAL RESULT CALLED TO, READ BACK BY AND VERIFIED WITH:     OSBOURNE,K. RN AT 8127 07/21/14 BARFIELD,T   Chloride 100  96 - 112 mEq/[name]   CO2 18 (*) 19 - 32 mEq/[name]   Glucose, Bld 154 (*) 70 - 99 mg/dL   BUN 65 (*) 6 - 23 mg/dL   Comment: DELTA CHECK NOTED     REPEATED TO VERIFY     RESULT CALLED TO, READ BACK BY AND VERIFIED WITH:     OSBOURNE,K. RN AT 5170 07/21/14 BARFIELD,T   Creatinine, Ser 4.30 (*) 0.50 - 1.35 mg/dL   Comment: DELTA CHECK NOTED     REPEATED TO VERIFY     RESULT CALLED TO, READ BACK BY AND VERIFIED WITH:     OSBOURNE,K. RN AT 0174 07/21/14 BARFIELD,T   Calcium 8.5  8.4 - 10.5 mg/dL   Total Protein 8.2  6.0 - 8.3 g/dL   Albumin 3.6  3.5 - 5.2 g/dL   AST 21  0 - 37 U/[name]   ALT 13  0 - 53 U/[name]   Alkaline Phosphatase 55  39 - 117 U/[name]   Total Bilirubin 0.8  0.3 - 1.2 mg/dL   GFR calc non Af Amer 15 (*) >90 mL/min   GFR calc Af Amer 17 (*) >90 mL/min   Comment: (NOTE)     The eGFR has been calculated using the CKD EPI equation.     This calculation has not been validated in all clinical situations.     eGFR's persistently <90 mL/min signify possible Chronic Kidney     Disease.   Anion gap 20 (*) 5 - 15  PROTIME-INR     Status: None   Collection Time    07/21/14  5:00 AM      Result Value Ref Range   Prothrombin Time 14.5  11.6 - 15.2 seconds   INR 1.13  0.00 - 1.49  APTT     Status: Abnormal   Collection Time    07/21/14  5:00 AM      Result Value Ref Range   aPTT 40 (*) 24 - 37 seconds   Comment:            IF BASELINE aPTT IS ELEVATED,  SUGGEST PATIENT RISK ASSESSMENT     BE USED TO DETERMINE APPROPRIATE     ANTICOAGULANT  THERAPY.  GLUCOSE, CAPILLARY     Status: Abnormal   Collection Time    07/21/14  7:47 AM      Result Value Ref Range   Glucose-Capillary 128 (*) 70 - 99 mg/dL   Comment 1 Documented in Chart     Comment 2 Notify RN    POTASSIUM     Status: Abnormal   Collection Time    07/21/14  9:23 AM      Result Value Ref Range   Potassium 6.3 (*) 3.7 - 5.3 mEq/[name]  GLUCOSE, CAPILLARY     Status: Abnormal   Collection Time    07/21/14 11:15 AM      Result Value Ref Range   Glucose-Capillary 104 (*) 70 - 99 mg/dL  GLUCOSE, RANDOM     Status: None   Collection Time    07/21/14  1:21 PM      Result Value Ref Range   Glucose, Bld 89  70 - 99 mg/dL  POTASSIUM     Status: None   Collection Time    07/21/14  1:21 PM      Result Value Ref Range   Potassium 5.3  3.7 - 5.3 mEq/[name]  GLUCOSE, CAPILLARY     Status: Abnormal   Collection Time    07/21/14  1:22 PM      Result Value Ref Range   Glucose-Capillary 108 (*) 70 - 99 mg/dL  GLUCOSE, RANDOM     Status: Abnormal   Collection Time    07/21/14  4:13 PM      Result Value Ref Range   Glucose, Bld 101 (*) 70 - 99 mg/dL  POTASSIUM     Status: Abnormal   Collection Time    07/21/14  4:13 PM      Result Value Ref Range   Potassium 5.4 (*) 3.7 - 5.3 mEq/[name]  GLUCOSE, CAPILLARY     Status: Abnormal   Collection Time    07/21/14  4:29 PM      Result Value Ref Range   Glucose-Capillary 110 (*) 70 - 99 mg/dL  GLUCOSE, RANDOM     Status: Abnormal   Collection Time    07/21/14  7:40 PM      Result Value Ref Range   Glucose, Bld 125 (*) 70 - 99 mg/dL  POTASSIUM     Status: None   Collection Time    07/21/14  7:40 PM      Result Value Ref Range   Potassium 5.0  3.7 - 5.3 mEq/[name]  GLUCOSE, CAPILLARY     Status: None   Collection Time    07/21/14  7:55 PM      Result Value Ref Range   Glucose-Capillary 96  70 - 99 mg/dL  GLUCOSE, CAPILLARY     Status: None   Collection Time    07/22/14  8:11 AM      Result Value Ref Range   Glucose-Capillary 82   70 - 99 mg/dL  CBC     Status: Abnormal   Collection Time    07/22/14 10:00 AM      Result Value Ref Range   WBC 6.3  4.0 - 10.5 K/uL   RBC 5.47  4.22 - 5.81 MIL/uL   Hemoglobin 15.7  13.0 - 17.0 g/dL   HCT 44.5  39.0 - 52.0 %   MCV 81.4  78.0 -  100.0 fL   MCH 28.7  26.0 - 34.0 pg   MCHC 35.3  30.0 - 36.0 g/dL   RDW 15.6 (*) 11.5 - 15.5 %   Platelets 209  150 - 400 K/uL  BASIC METABOLIC PANEL     Status: Abnormal   Collection Time    07/22/14 10:00 AM      Result Value Ref Range   Sodium 142  137 - 147 mEq/[name]   Comment: REPEATED TO VERIFY   Potassium 6.5 (*) 3.7 - 5.3 mEq/[name]   Comment: DELTA CHECK NOTED     REPEATED TO VERIFY     NO VISIBLE HEMOLYSIS     CRITICAL RESULT CALLED TO, READ BACK BY AND VERIFIED WITH:     DEUTSCH,J. RN AT 1103 07/22/14 BARFIELD,T   Chloride 102  96 - 112 mEq/[name]   Comment: REPEATED TO VERIFY   CO2 17 (*) 19 - 32 mEq/[name]   Comment: REPEATED TO VERIFY   Glucose, Bld 87  70 - 99 mg/dL   BUN 90 (*) 6 - 23 mg/dL   Creatinine, Ser 6.24 (*) 0.50 - 1.35 mg/dL   Calcium 9.1  8.4 - 10.5 mg/dL   GFR calc non Af Amer 9 (*) >90 mL/min   GFR calc Af Amer 11 (*) >90 mL/min   Comment: (NOTE)     The eGFR has been calculated using the CKD EPI equation.     This calculation has not been validated in all clinical situations.     eGFR's persistently <90 mL/min signify possible Chronic Kidney     Disease.   Anion gap 23 (*) 5 - 15   Comment: REPEATED TO VERIFY    Dg Abd 2 Views  07/22/2014   CLINICAL DATA:  Followup small bowel obstruction.  EXAM: ABDOMEN - 2 VIEW  COMPARISON:  07/21/2014  FINDINGS: NG tube tip is in the mid stomach. Decreasing small bowel distention PA continued mild small bowel prominence in the left upper quadrant. No organomegaly or free air. No suspicious calcification.  IMPRESSION: Improving small bowel obstruction pattern with NG tube in place.   Electronically Signed   By: Rolm Baptise M.D.   On: 07/22/2014 08:00   Dg Abd 2 Views  07/21/2014    CLINICAL DATA:  Small bowel obstruction, followup  EXAM: ABDOMEN - 2 VIEW  COMPARISON:  Abdomen film of 07/20/2014 and CT abdomen pelvis of 07/19/2014  FINDINGS: There is persistent small bowel dilatation measuring up to 4.9 cm consistent with persistent small bowel obstruction. No free air is seen on the left lateral decubitus of the abdomen. No bowel gas is seen within the colon. Scattered air-fluid levels are noted on the decubitus film.  IMPRESSION: Persistent high-grade small bowel obstruction.  No free air.   Electronically Signed   By: Ivar Drape M.D.   On: 07/21/2014 08:15    ROS Blood pressure 140/90, pulse 120, temperature 97.4 F (36.3 C), temperature source Rectal, resp. rate 15, height 5' 3"  (1.6 m), weight 57.7 kg (127 lb 3.3 oz), SpO2 97.00%. Physical Exam Physical Examination: General appearance - chronically ill appearing and uncomfortable Mental status - no English but cooperative, daughter helps Eyes - pupils equal and reactive, extraocular eye movements intact Mouth - mucous membranes moist, pharynx normal without lesions Neck - adenopathy noted PCL Lymphatics - posterior cervical nodes Chest - clear to auscultation, no wheezes, rales or rhonchi, symmetric air entry Heart - S1 and S2 normal, systolic murmur Gr 2/6 M/6 at apex  Abdomen - no bs, distended and tender Extremities - peripheral pulses normal, no pedal edema, no clubbing or cyanosis Skin - normal coloration and turgor, no rashes, no suspicious skin lesions noted  Assessment/Plan: 1 AKI contrast in setting of vol depletion.  R/O AIN, some component of obstruction.  Acidemic, Raliegh Ip, and ^ solute.  Need to lower total body K ie Kayex or dialysis.  Very worrisome catabolic rate with K this high. Must ? vasc compromise. Will check lactate 2 SBO  Assumed adhesions,not clear to me 3 Esophageal Ca  4. Malnutrition  P IV isotonic bicarb, UA, Urine Na and Cr, Check lactate, LDH, CK  Bruce [name] 07/22/2014, 2:52 PM

## 2014-07-22 NOTE — Progress Notes (Signed)
Patient ID: Bruce [name], male   DOB: Apr 29, 1961, 52 y.o.   MRN: [id number] [facility] PROGRESS NOTE  Daequan Kozma UVO:[id number] DOB: Sep 27, 1961 DOA: 07/19/2014 PCP: No PCP Per Patient  Brief narrative: 53 y.o. 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 CT abdomen showed small bowel obstruction due to adhesions.   Assessment/Plan:   Principal Problem:  SBO (small bowel obstruction)  Secondary to adhesions. NG tube in place. Patient feels better. Abdominal x-ray done 07/22/2014 shows improving small bowel obstruction with NG tube in place. We'll allow clear liquid diet but keep NG tube to low intermittent suction.  Continue supportive care with IV fluids, analgesia and antiemetics and needed. Appreciate surgery following.  Patient was initially on Zosyn but this was stopped 07/20/2014, no evidence of intra-abdominal infection. Active Problems:  Hyperkalemia  Potassium  was 7 on 07/21/2014. Patient was given Lasix, insulin, D50 which brought the potassium to 6.3. This regimen was repeated and brought potassium to 5.3. He was then given insulin and D50 only and potassium normalized. This morning potassium is 6.5. I spoke with nephrology and recommendation was for Kayexalate enema. Appreciate nephrology consult and recommendations. 12-lead EKG showed sinus tachycardia. Acute renal failure  Probably  contrast-induced nephropathy from recent CAT scan 07/19/2014. Renal function initially within normal limit but trended up to 6.24 this morning. Appreciate a renal consult and recommendations. Continue IV fluids.  Malignant neoplasm of lower third of esophagus  Per oncology Leukocytosis / leukopenia  Likely stress de-margination. Repeat blood work shows leukopenia. Continue to monitor CBC. DVT Prophylaxis  SCD's bilaterally    Code Status: Full.  Family Communication: plan of care discussed with the patient and his family at the bedside.  Disposition Plan: Home when stable.    IV Access:   Peripheral IV Procedures and diagnostic studies:   Dg Chest 2 View 07/19/2014 1. Stable right pleural effusion and linear pulmonary opacities. No acute abnormality.  Ct Abdomen Pelvis W Contrast 07/19/2014 1. Mid small bowel obstruction without discrete mass or evident etiology, suggesting adhesions. 2. Stable moderate right pleural effusion.  Dg Abd 1 View 07/20/2014 1. Similar findings suggestive of high-grade small bowel obstruction. 2. Enteric tube side port overlies the distal esophagus. Advancement at least 8 cm is recommended.  Dg Abd 2 Views 07/21/2014 Persistent high-grade small bowel obstruction. No free air. Dg Abd 2 Views 07/22/2014   Improving small bowel obstruction pattern with NG tube in place.   Medical Consultants:   Surgery (Dr. Armandina Gemma) Nephrology  Other Consultants:   Nutrition  Anti-Infectives:   None    Leisa Lenz, MD  [facility] Pager [phone]  If 7PM-7AM, please contact night-coverage [email or web address] Password [email or web address] 07/22/2014, 11:34 AM   LOS: 3 days    HPI/Subjective: No acute overnight events.  Objective: Filed Vitals:   07/21/14 2137 07/22/14 0406 07/22/14 0647 07/22/14 1020  BP: 131/76 140/90    Pulse: 118 120    Temp: 97.6 F (36.4 C) 97.7 F (36.5 C) 99.1 F (37.3  C) 97.4 F (36.3 C)  TempSrc: Oral Oral Rectal   Resp: 28 15    Height:       Weight:      SpO2: 94% 97%      Intake/Output Summary (Last 24 hours) at 07/22/14 1134 Last data filed at 07/22/14 3545  Gross per 24 hour  Intake   1605 ml  Output   2575 ml  Net   -970 ml    Exam:   General:  Pt is alert, follows commands appropriately, not in acute distress  Cardiovascular: Regular rate and rhythm, S1/S2, no murmurs  Respiratory: Clear to auscultation bilaterally, no wheezing, no crackles, no rhonchi  Abdomen: Soft, non tender, non distended, bowel sounds present  Extremities: No edema, pulses DP and PT palpable bilaterally  Neuro: Grossly nonfocal  Data Reviewed: Basic Metabolic Panel:  Recent Labs Lab 07/19/14 2043 07/21/14 0500 07/21/14 0923 07/21/14 1321 07/21/14 1613 07/21/14 1940 07/22/14 1000  NA 137 138  --   --   --   --  142  K 5.2 7.3* 6.3* 5.3 5.4* 5.0 6.5*  CL 96 100  --   --   --   --  102  CO2 21 18*  --   --   --   --  17*  GLUCOSE 154* 154*  --  89 101* 125* 87  BUN 16 65*  --   --   --   --  90*  CREATININE 0.95 4.30*  --   --   --   --  6.24*  CALCIUM 10.8* 8.5  --   --   --   --  9.1   Liver Function Tests:  Recent Labs Lab 07/19/14 2043 07/21/14 0500  AST 49* 21  ALT 18 13  ALKPHOS 124* 55  BILITOT 0.6 0.8  PROT 9.3* 8.2  ALBUMIN 4.8 3.6    Recent Labs Lab 07/19/14 2043  LIPASE 15   No results found for this basename: AMMONIA,  in the last 168 hours CBC:  Recent Labs Lab 07/19/14 2043 07/21/14 0500 07/22/14 1000  WBC 16.0* 2.8* 6.3  NEUTROABS 12.4*  --   --   HGB 15.7 15.9 15.7  HCT 44.9 47.2 44.5  MCV 81.8 83.5 81.4  PLT 284 228 209   Cardiac Enzymes: No results found for this basename: CKTOTAL, CKMB, CKMBINDEX, TROPONINI,  in the last 168 hours BNP: No components found with this basename: POCBNP,  CBG:  Recent Labs Lab 07/21/14 1115 07/21/14 1322 07/21/14 1629 07/21/14 1955 07/22/14 0811  GLUCAP 104* 108* 110* 96 82    No results found for this or any previous visit (from the  past 240 hour(s)).   Scheduled Meds: . antiseptic oral rinse  7 mL Mouth Rinse BID  . pantoprazole (PROTONIX) IV  40 mg Intravenous Q12H  . sodium polystyrene  60 g Rectal Once   Continuous Infusions: . sodium chloride 75 mL/hr at 07/22/14 0353

## 2014-07-23 LAB — CBC
HEMATOCRIT: 41 % (ref 39.0–52.0)
Hemoglobin: 13.9 g/dL (ref 13.0–17.0)
MCH: 28.1 pg (ref 26.0–34.0)
MCHC: 33.9 g/dL (ref 30.0–36.0)
MCV: 82.8 fL (ref 78.0–100.0)
Platelets: 223 10*3/uL (ref 150–400)
RBC: 4.95 MIL/uL (ref 4.22–5.81)
RDW: 15.5 % (ref 11.5–15.5)
WBC: 8.8 10*3/uL (ref 4.0–10.5)

## 2014-07-23 LAB — COMPREHENSIVE METABOLIC PANEL
ALT: 12 U/L (ref 0–53)
ANION GAP: 19 — AB (ref 5–15)
AST: 15 U/L (ref 0–37)
Albumin: 2.7 g/dL — ABNORMAL LOW (ref 3.5–5.2)
Alkaline Phosphatase: 52 U/L (ref 39–117)
BUN: 106 mg/dL — AB (ref 6–23)
CO2: 22 mEq/L (ref 19–32)
CREATININE: 6.42 mg/dL — AB (ref 0.50–1.35)
Calcium: 8.6 mg/dL (ref 8.4–10.5)
Chloride: 102 mEq/L (ref 96–112)
GFR calc Af Amer: 10 mL/min — ABNORMAL LOW (ref >90)
GFR calc non Af Amer: 9 mL/min — ABNORMAL LOW (ref >90)
Glucose, Bld: 132 mg/dL — ABNORMAL HIGH (ref 70–99)
POTASSIUM: 4.7 meq/L (ref 3.7–5.3)
Sodium: 143 mEq/L (ref 137–147)
TOTAL PROTEIN: 6.6 g/dL (ref 6.0–8.3)
Total Bilirubin: 0.5 mg/dL (ref 0.3–1.2)

## 2014-07-23 LAB — GLUCOSE, CAPILLARY: Glucose-Capillary: 139 mg/dL — ABNORMAL HIGH (ref 70–99)

## 2014-07-23 LAB — PHOSPHORUS: PHOSPHORUS: 6.7 mg/dL — AB (ref 2.3–4.6)

## 2014-07-23 NOTE — Progress Notes (Signed)
Patient ID: Bruce [name], male   DOB: 05-12-1961, 53 y.o.   MRN: [id number] [facility] PROGRESS NOTE  Bruce [name] JME:[id number] DOB: 1961/10/26 DOA: 07/19/2014 PCP: No PCP Per Patient  Brief narrative: 53 y.o. 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 CT abdomen showed small bowel obstruction due to adhesions.   Assessment/Plan:   Principal Problem:  SBO (small bowel obstruction)  Secondary to adhesions. NG tube in place. Patient feels better. Abdominal x-ray done 07/22/2014 shows improving small bowel obstruction with NG tube in place. Per surgery we will clamp NG tube, allow CLD. Continue supportive care with IV fluids until PO intake improves, analgesia and antiemetics and needed.  Appreciate surgery following.  Patient was initially on Zosyn but this was stopped 07/20/2014, no evidence of intra-abdominal infection. Active Problems:  Hyperkalemia  Potassium was 7 on 07/21/2014. Patient was given Lasix, insulin, D50 which brought the potassium to 6.3. This regimen was repeated and brought potassium to 5.3. He was then given insulin and D50 only and potassium normalized. On 07/22/2014 potassium again elevated at 6.5 and per renal we added kayexalate enema. Potassium now normalized.  12-lead EKG showed sinus tachycardia. Acute renal failure   Probably contrast-induced nephropathy from recent CAT scan 07/19/2014. Renal  function initially within normal limit but trended up to 6.52.   Per renal, added sodium bicarb drip. Renal function seems to be stable and will hopefully improve with this addition of sodium bicarb. Malignant neoplasm of lower third of esophagus  Per oncology Leukocytosis / leukopenia  Likely stress de-margination. Repeat blood work showed normal WBC count  DVT Prophylaxis  SCD's bilaterally    Code Status: Full.  Family Communication: plan of care discussed with the patient and his family at the bedside.  Disposition Plan: Home when stable.    IV Access:   Peripheral IV Procedures and diagnostic studies:   Dg Chest 2 View 07/19/2014 1. Stable right pleural effusion and linear pulmonary opacities. No acute abnormality.  Ct Abdomen Pelvis W Contrast 07/19/2014 1. Mid small bowel obstruction without discrete mass or evident etiology, suggesting adhesions. 2. Stable moderate right pleural effusion.  Dg Abd 1 View 07/20/2014 1. Similar findings suggestive of high-grade small bowel obstruction. 2. Enteric tube side port overlies the distal esophagus. Advancement at least 8 cm is recommended.  Dg Abd 2 Views 07/21/2014 Persistent high-grade small bowel obstruction. No free air.  Dg Abd 2 Views 07/22/2014 Improving small bowel obstruction pattern with NG tube in place.  US Renal 07/22/2014   1. No explanation for patient's elevated creatinine. Specifically, no evidence of urinary obstruction. 2. At least small sized right-sided pleural effusion incidentally noted, as seen on recently performed abdominal CT.  Medical Consultants:   Surgery (Dr. Armandina Gemma)  Nephrology  Other Consultants:   Nutrition  Anti-Infectives:   None    Leisa Lenz, MD  [facility] Pager [phone]  If 7PM-7AM, please contact night-coverage [email or web address] Password [email or web address] 07/23/2014, 1:17 PM   LOS: 4 days    HPI/Subjective: No acute overnight  events.  Objective: Filed Vitals:   07/22/14 0647 07/22/14 1020  07/22/14 2027 07/23/14 0553  BP:   128/73 117/65  Pulse:   114 91  Temp: 99.1 F (37.3 C) 97.4 F (36.3 C) 98.8 F (37.1 C) 98.1 F (36.7 C)  TempSrc: Rectal  Oral Oral  Resp:   16 16  Height:      Weight:    53.751 kg (118 lb 8 oz)  SpO2:   97% 95%    Intake/Output Summary (Last 24 hours) at 07/23/14 1317 Last data filed at 07/23/14 1234  Gross per 24 hour  Intake    450 ml  Output   1710 ml  Net  -1260 ml    Exam:   General:  Pt is alert, follows commands appropriately, not in acute distress  Cardiovascular: Regular rate and rhythm, S1/S2, no murmurs  Respiratory: Clear to auscultation bilaterally, no wheezing, no crackles, no rhonchi  Abdomen: Soft, non tender, non distended, bowel sounds present  Extremities: No edema, pulses DP and PT palpable bilaterally  Neuro: Grossly nonfocal  Data Reviewed: Basic Metabolic Panel:  Recent Labs Lab 07/19/14 2043 07/21/14 0500  07/21/14 1613 07/21/14 1940 07/22/14 1000 07/22/14 1614 07/23/14 0436  NA 137 138  --   --   --  142 143 143  K 5.2 7.3*  < > 5.4* 5.0 6.5* 5.8*  5.5* 4.7  CL 96 100  --   --   --  102 101 102  CO2 21 18*  --   --   --  17* 17* 22  GLUCOSE 154* 154*  < > 101* 125* 87 111* 132*  BUN 16 65*  --   --   --  90* 97* 106*  CREATININE 0.95 4.30*  --   --   --  6.24* 6.52* 6.42*  CALCIUM 10.8* 8.5  --   --   --  9.1 9.2 8.6  PHOS  --   --   --   --   --   --  8.4* 6.7*  < > = values in this interval not displayed. Liver Function Tests:  Recent Labs Lab 07/19/14 2043 07/21/14 0500 07/22/14 1614 07/23/14 0436  AST 49* 21  --  15  ALT 18 13  --  12  ALKPHOS 124* 55  --  52  BILITOT 0.6 0.8  --  0.5  PROT 9.3* 8.2  --  6.6  ALBUMIN 4.8 3.6 3.0* 2.7*    Recent Labs Lab 07/19/14 2043  LIPASE 15   No results found for this basename: AMMONIA,  in the last 168 hours CBC:  Recent Labs Lab 07/19/14 2043 07/21/14 0500 07/22/14 1000 07/23/14 0436  WBC 16.0* 2.8* 6.3 8.8  NEUTROABS  12.4*  --   --   --   HGB 15.7 15.9 15.7 13.9  HCT 44.9 47.2 44.5 41.0  MCV 81.8 83.5 81.4 82.8  PLT 284 228 209 223   Cardiac Enzymes:  Recent Labs Lab 07/22/14 1613  CKTOTAL 153   BNP: No components found with this basename: POCBNP,  CBG:  Recent Labs Lab 07/21/14 1322 07/21/14 1629 07/21/14 1955 07/22/14 0811 07/23/14 0734  GLUCAP 108* 110* 96 82 139*    No results found for this or any previous visit (from the past 240 hour(s)).   Scheduled Meds: . pantoprazole (PROTONIX) IV  40 mg Intravenous Q12H   Continuous Infusions: .  sodium bicarbonate 150 mEq in sterile water 1000 mL infusion 125 mL/hr  at 07/23/14 [phone]

## 2014-07-23 NOTE — Progress Notes (Addendum)
[facility]  [address] [place], [place] [address] Phone: [phone] FAX: [place] [phone] 08-29-1961  CARE TEAM:  PCP: No PCP Per Patient  Outpatient Care Team: Patient Care Team: No Pcp Per Patient as PCP - General (General Practice) Leandrew Koyanagi, MD (Family Medicine) Irene Shipper, MD (Gastroenterology) Milus Banister, MD (Gastroenterology) Ladell Pier, MD as Attending Physician (Hematology and Oncology) Marye Round, MD as Consulting Physician (Radiation Oncology)  Inpatient Treatment Team: Treatment Team: Attending Provider: Robbie Lis, MD; Technician: Elwanda Brooklyn, NT; Consulting Physician: Nolon Nations, MD; Registered Nurse: Charolette Child, RN; Rounding Team: Joycelyn Das, MD; Technician: Darlyn Chamber, [place]; Registered Nurse: Loyal Gambler, RN; Registered Nurse: Ashley Murrain, RN; Registered Nurse: Jacquelin Hawking, RN; Consulting Physician: Placido Sou, MD; Physician Assistant: Clinton Gallant, NT; Registered Nurse: Sibyl Parr, RN   Subjective:  Feeling better.  Daughter in room.  Patient's speaks no [place].  Daughter is bilingual, comfortable as being the Optometrist.  Patient walking in hallways.  Objective:  Vital signs:  Filed Vitals:   07/22/14 0647 07/22/14 1020 07/22/14 2027 07/23/14 0553  BP:   128/73 117/65  Pulse:   114 91  Temp: 99.1 F (37.3 C) 97.4 F (36.3 C) 98.8 F (37.1 C) 98.1 F (36.7 C)  TempSrc: Rectal  Oral Oral  Resp:   16 16  Height:      Weight:    118 lb 8 oz (53.751 kg)  SpO2:   97% 95%    Last BM Date: 07/22/14  Intake/Output   Yesterday:  09/04 0701 - 09/05 0700 In: 210 [P.O.:180; NG/GT:30] Out: 7322 [Urine:1425; Emesis/NG output:210] This shift:  Total I/O In: 120 [Other:120] Out: -   Bowel function:  Flatus: y  BM: n  Drain: thick syrupy bilious  Physical Exam:  General: Pt  awake/alert/oriented x4 in no acute distress Eyes: PERRL, normal EOM.  Sclera clear.  No icterus Neuro: CN II-XII intact w/o focal sensory/motor deficits. Lymph: No head/neck/groin lymphadenopathy Psych:  No delerium/psychosis/paranoia HENT: Normocephalic, Mucus membranes moist.  No thrush.  NGT in place Neck: Supple, No tracheal deviation Chest: No chest wall pain w good excursion CV:  Pulses intact.  Regular rhythm MS: Normal AROM mjr joints.  No obvious deformity Abdomen: Soft.  Nondistended.  Nontender.  No evidence of peritonitis.  No incarcerated hernias. Ext:  SCDs BLE.  No mjr edema.  No cyanosis Skin: No petechiae / purpura   Problem List:   Principal Problem:   SBO (small bowel obstruction) Active Problems:   Malignant neoplasm of lower third of esophagus   Assessment  Bruce [name]  53 y.o. male       Partial small bowel obstruction most likely due to adhesions.  Improving clinically.  Plan:  -clamp NGT trial -try clears.  D/C NGT if tolerates clamping with clears  -AKI/renal failure - BUN/Cr high but nonoliguric - defer to IM & renal -hyperK improved - follow closely -PPI IV for now -VTE prophylaxis- SCDs, etc -mobilize as tolerated to help recovery  [name], M.D., F.A.C.S. Gastrointestinal and Minimally Invasive Surgery [facility] [address], [place], [place] [address] [phone] Main / Paging   07/23/2014   Results:   Labs: Results for orders placed during the hospital encounter of 07/19/14 (from the past 48 hour(s))  GLUCOSE, CAPILLARY     Status:  Abnormal   Collection Time    07/21/14 11:15 AM      Result Value Ref Range   Glucose-Capillary 104 (*) 70 - 99 mg/dL  GLUCOSE, RANDOM     Status: None   Collection Time    07/21/14  1:21 PM      Result Value Ref Range   Glucose, Bld 89  70 - 99 mg/dL  POTASSIUM     Status: None   Collection Time    07/21/14  1:21 PM      Result Value Ref  Range   Potassium 5.3  3.7 - 5.3 mEq/L  GLUCOSE, CAPILLARY     Status: Abnormal   Collection Time    07/21/14  1:22 PM      Result Value Ref Range   Glucose-Capillary 108 (*) 70 - 99 mg/dL  GLUCOSE, RANDOM     Status: Abnormal   Collection Time    07/21/14  4:13 PM      Result Value Ref Range   Glucose, Bld 101 (*) 70 - 99 mg/dL  POTASSIUM     Status: Abnormal   Collection Time    07/21/14  4:13 PM      Result Value Ref Range   Potassium 5.4 (*) 3.7 - 5.3 mEq/L  GLUCOSE, CAPILLARY     Status: Abnormal   Collection Time    07/21/14  4:29 PM      Result Value Ref Range   Glucose-Capillary 110 (*) 70 - 99 mg/dL  GLUCOSE, RANDOM     Status: Abnormal   Collection Time    07/21/14  7:40 PM      Result Value Ref Range   Glucose, Bld 125 (*) 70 - 99 mg/dL  POTASSIUM     Status: None   Collection Time    07/21/14  7:40 PM      Result Value Ref Range   Potassium 5.0  3.7 - 5.3 mEq/L  GLUCOSE, CAPILLARY     Status: None   Collection Time    07/21/14  7:55 PM      Result Value Ref Range   Glucose-Capillary 96  70 - 99 mg/dL  GLUCOSE, CAPILLARY     Status: None   Collection Time    07/22/14  8:11 AM      Result Value Ref Range   Glucose-Capillary 82  70 - 99 mg/dL  CBC     Status: Abnormal   Collection Time    07/22/14 10:00 AM      Result Value Ref Range   WBC 6.3  4.0 - 10.5 K/uL   RBC 5.47  4.22 - 5.81 MIL/uL   Hemoglobin 15.7  13.0 - 17.0 g/dL   HCT 44.5  39.0 - 52.0 %   MCV 81.4  78.0 - 100.0 fL   MCH 28.7  26.0 - 34.0 pg   MCHC 35.3  30.0 - 36.0 g/dL   RDW 15.6 (*) 11.5 - 15.5 %   Platelets 209  150 - 400 K/uL  BASIC METABOLIC PANEL     Status: Abnormal   Collection Time    07/22/14 10:00 AM      Result Value Ref Range   Sodium 142  137 - 147 mEq/L   Comment: REPEATED TO VERIFY   Potassium 6.5 (*) 3.7 - 5.3 mEq/L   Comment: DELTA CHECK NOTED     REPEATED TO VERIFY     NO VISIBLE HEMOLYSIS     CRITICAL RESULT CALLED TO, READ  BACK BY AND VERIFIED WITH:      DEUTSCH,J. RN AT 1103 07/22/14 BARFIELD,T   Chloride 102  96 - 112 mEq/L   Comment: REPEATED TO VERIFY   CO2 17 (*) 19 - 32 mEq/L   Comment: REPEATED TO VERIFY   Glucose, Bld 87  70 - 99 mg/dL   BUN 90 (*) 6 - 23 mg/dL   Creatinine, Ser 6.24 (*) 0.50 - 1.35 mg/dL   Calcium 9.1  8.4 - 10.5 mg/dL   GFR calc non Af Amer 9 (*) >90 mL/min   GFR calc Af Amer 11 (*) >90 mL/min   Comment: (NOTE)     The eGFR has been calculated using the CKD EPI equation.     This calculation has not been validated in all clinical situations.     eGFR's persistently <90 mL/min signify possible Chronic Kidney     Disease.   Anion gap 23 (*) 5 - 15   Comment: REPEATED TO VERIFY  CK     Status: None   Collection Time    07/22/14  4:13 PM      Result Value Ref Range   Total CK 153  7 - 232 U/L  LACTATE DEHYDROGENASE     Status: None   Collection Time    07/22/14  4:13 PM      Result Value Ref Range   LDH 234  94 - 250 U/L  URIC ACID     Status: Abnormal   Collection Time    07/22/14  4:13 PM      Result Value Ref Range   Uric Acid, Serum 12.8 (*) 4.0 - 7.8 mg/dL  RENAL FUNCTION PANEL     Status: Abnormal   Collection Time    07/22/14  4:14 PM      Result Value Ref Range   Sodium 143  137 - 147 mEq/L   Potassium 5.8 (*) 3.7 - 5.3 mEq/L   Chloride 101  96 - 112 mEq/L   CO2 17 (*) 19 - 32 mEq/L   Glucose, Bld 111 (*) 70 - 99 mg/dL   BUN 97 (*) 6 - 23 mg/dL   Creatinine, Ser 6.52 (*) 0.50 - 1.35 mg/dL   Calcium 9.2  8.4 - 10.5 mg/dL   Phosphorus 8.4 (*) 2.3 - 4.6 mg/dL   Albumin 3.0 (*) 3.5 - 5.2 g/dL   GFR calc non Af Amer 9 (*) >90 mL/min   GFR calc Af Amer 10 (*) >90 mL/min   Comment: (NOTE)     The eGFR has been calculated using the CKD EPI equation.     This calculation has not been validated in all clinical situations.     eGFR's persistently <90 mL/min signify possible Chronic Kidney     Disease.   Anion gap 25 (*) 5 - 15  POTASSIUM     Status: Abnormal   Collection Time    07/22/14   4:14 PM      Result Value Ref Range   Potassium 5.5 (*) 3.7 - 5.3 mEq/L  LACTIC ACID, PLASMA     Status: None   Collection Time    07/22/14  4:15 PM      Result Value Ref Range   Lactic Acid, Venous 1.6  0.5 - 2.2 mmol/L  URINALYSIS, ROUTINE W REFLEX MICROSCOPIC     Status: Abnormal   Collection Time    07/22/14  9:00 PM      Result Value Ref Range   Color,  Urine YELLOW  YELLOW   APPearance CLOUDY (*) CLEAR   Specific Gravity, Urine 1.017  1.005 - 1.030   pH 5.0  5.0 - 8.0   Glucose, UA NEGATIVE  NEGATIVE mg/dL   Hgb urine dipstick MODERATE (*) NEGATIVE   Bilirubin Urine NEGATIVE  NEGATIVE   Ketones, ur NEGATIVE  NEGATIVE mg/dL   Protein, ur 100 (*) NEGATIVE mg/dL   Urobilinogen, UA 0.2  0.0 - 1.0 mg/dL   Nitrite NEGATIVE  NEGATIVE   Leukocytes, UA SMALL (*) NEGATIVE  SODIUM, URINE, RANDOM     Status: None   Collection Time    07/22/14  9:00 PM      Result Value Ref Range   Sodium, Ur 59     Comment: Performed at [facility], URINE, RANDOM     Status: None   Collection Time    07/22/14  9:00 PM      Result Value Ref Range   Creatinine, Urine 84.33     Comment: Performed at [facility] [place]     Status: Abnormal   Collection Time    07/22/14  9:00 PM      Result Value Ref Range   Squamous Epithelial / LPF RARE  RARE   WBC, UA 7-10  <3 WBC/hpf   RBC / HPF 11-20  <3 RBC/hpf   Bacteria, UA RARE  RARE   Casts HYALINE CASTS (*) NEGATIVE  CBC     Status: None   Collection Time    07/23/14  4:36 AM      Result Value Ref Range   WBC 8.8  4.0 - 10.5 K/uL   RBC 4.95  4.22 - 5.81 MIL/uL   Hemoglobin 13.9  13.0 - 17.0 g/dL   HCT 41.0  39.0 - 52.0 %   MCV 82.8  78.0 - 100.0 fL   MCH 28.1  26.0 - 34.0 pg   MCHC 33.9  30.0 - 36.0 g/dL   RDW 15.5  11.5 - 15.5 %   Platelets 223  150 - 400 K/uL  COMPREHENSIVE METABOLIC PANEL     Status: Abnormal   Collection Time    07/23/14  4:36 AM      Result Value Ref Range   Sodium 143   137 - 147 mEq/L   Potassium 4.7  3.7 - 5.3 mEq/L   Chloride 102  96 - 112 mEq/L   CO2 22  19 - 32 mEq/L   Glucose, Bld 132 (*) 70 - 99 mg/dL   BUN 106 (*) 6 - 23 mg/dL   Creatinine, Ser 6.42 (*) 0.50 - 1.35 mg/dL   Calcium 8.6  8.4 - 10.5 mg/dL   Total Protein 6.6  6.0 - 8.3 g/dL   Albumin 2.7 (*) 3.5 - 5.2 g/dL   AST 15  0 - 37 U/L   ALT 12  0 - 53 U/L   Alkaline Phosphatase 52  39 - 117 U/L   Total Bilirubin 0.5  0.3 - 1.2 mg/dL   GFR calc non Af Amer 9 (*) >90 mL/min   GFR calc Af Amer 10 (*) >90 mL/min   Comment: (NOTE)     The eGFR has been calculated using the CKD EPI equation.     This calculation has not been validated in all clinical situations.     eGFR's persistently <90 mL/min signify possible Chronic Kidney     Disease.   Anion gap 19 (*) 5 - 15  PHOSPHORUS     Status: Abnormal   Collection Time    07/23/14  4:36 AM      Result Value Ref Range   Phosphorus 6.7 (*) 2.3 - 4.6 mg/dL  GLUCOSE, CAPILLARY     Status: Abnormal   Collection Time    07/23/14  7:34 AM      Result Value Ref Range   Glucose-Capillary 139 (*) 70 - 99 mg/dL    Imaging / Studies: US Renal  07/22/2014   CLINICAL DATA:  Nausea and abdominal pain. Small-bowel obstruction. Elevated creatinine. History of esophageal carcinoma.  EXAM: RENAL/URINARY TRACT ULTRASOUND COMPLETE  COMPARISON:  CT abdomen pelvis - 07/19/2014  FINDINGS: Right Kidney:  Normal cortical thickness, echogenicity and size, measuring 10.9 cm in length. No focal renal lesions. No echogenic renal stones. No urinary obstruction.  Left Kidney:  Normal cortical thickness, echogenicity and size, measuring 10.0 cm in length. No focal renal lesions. No echogenic renal stones. No urinary obstruction.  Bladder:  Decompressed with a Foley catheter.  Incidental note is made of a at least small size right-sided pleural effusion (image 3).  IMPRESSION: 1. No explanation for patient's elevated creatinine. Specifically, no evidence of urinary  obstruction. 2. At least small sized right-sided pleural effusion incidentally noted, as seen on recently performed abdominal CT.   Electronically Signed   By: Sandi Mariscal M.D.   On: 07/22/2014 21:41   Dg Abd 2 Views  07/22/2014   CLINICAL DATA:  Followup small bowel obstruction.  EXAM: ABDOMEN - 2 VIEW  COMPARISON:  07/21/2014  FINDINGS: NG tube tip is in the mid stomach. Decreasing small bowel distention PA continued mild small bowel prominence in the left upper quadrant. No organomegaly or free air. No suspicious calcification.  IMPRESSION: Improving small bowel obstruction pattern with NG tube in place.   Electronically Signed   By: Rolm Baptise M.D.   On: 07/22/2014 08:00    Medications / Allergies: per chart  Antibiotics: Anti-infectives   Start     Dose/Rate Route Frequency Ordered Stop   07/20/14 0230  piperacillin-tazobactam (ZOSYN) IVPB 3.375 g  Status:  Discontinued     3.375 g 12.5 mL/hr over 240 Minutes Intravenous Every 8 hours 07/20/14 0209 07/20/14 1822       Note: Portions of this report may have been transcribed using voice recognition software. Every effort was made to ensure accuracy; however, inadvertent computerized transcription errors may be present.   Any transcriptional errors that result from this process are unintentional.

## 2014-07-23 NOTE — Progress Notes (Signed)
Subjective: Interval History: has no complaint, through interpreter.  Objective: Vital signs in last 24 hours: Temp:  [98.1 F (36.7 C)-98.8 F (37.1 C)] 98.1 F (36.7 C) (09/05 0553) Pulse Rate:  [91-114] 91 (09/05 0553) Resp:  [16] 16 (09/05 0553) BP: (117-128)/(65-73) 117/65 mmHg (09/05 0553) SpO2:  [95 %-97 %] 95 % (09/05 0553) Weight:  [53.751 kg (118 lb 8 oz)] 53.751 kg (118 lb 8 oz) (09/05 0553) Weight change: -3.949 kg (-8 lb 11.3 oz)  Intake/Output from previous day: 09/04 0701 - 09/05 0700 In: 210 [P.O.:180; NG/GT:30] Out: 1635 [Urine:1425; Emesis/NG output:210] Intake/Output this shift: Total I/O In: 120 [Other:120] Out: -   General appearance: alert and mild distress Resp: diminished breath sounds RLL and rales RLL Cardio: S1, S2 normal and systolic murmur: holosystolic 2/6, blowing at apex GI: no bs, mild tender Extremities: extremities normal, atraumatic, no cyanosis or edema  Lab Results:  Recent Labs  07/22/14 1000 07/23/14 0436  WBC 6.3 8.8  HGB 15.7 13.9  HCT 44.5 41.0  PLT 209 223   BMET:  Recent Labs  07/22/14 1614 07/23/14 0436  NA 143 143  K 5.8*  5.5* 4.7  CL 101 102  CO2 17* 22  GLUCOSE 111* 132*  BUN 97* 106*  CREATININE 6.52* 6.42*  CALCIUM 9.2 8.6   No results found for this basename: PTH,  in the last 72 hours Iron Studies: No results found for this basename: IRON, TIBC, TRANSFERRIN, FERRITIN,  in the last 72 hours  Studies/Results: US Renal  07/22/2014   CLINICAL DATA:  Nausea and abdominal pain. Small-bowel obstruction. Elevated creatinine. History of esophageal carcinoma.  EXAM: RENAL/URINARY TRACT ULTRASOUND COMPLETE  COMPARISON:  CT abdomen pelvis - 07/19/2014  FINDINGS: Right Kidney:  Normal cortical thickness, echogenicity and size, measuring 10.9 cm in length. No focal renal lesions. No echogenic renal stones. No urinary obstruction.  Left Kidney:  Normal cortical thickness, echogenicity and size, measuring 10.0 cm in  length. No focal renal lesions. No echogenic renal stones. No urinary obstruction.  Bladder:  Decompressed with a Foley catheter.  Incidental note is made of a at least small size right-sided pleural effusion (image 3).  IMPRESSION: 1. No explanation for patient's elevated creatinine. Specifically, no evidence of urinary obstruction. 2. At least small sized right-sided pleural effusion incidentally noted, as seen on recently performed abdominal CT.   Electronically Signed   By: Sandi Mariscal M.D.   On: 07/22/2014 21:41   Dg Abd 2 Views  07/22/2014   CLINICAL DATA:  Followup small bowel obstruction.  EXAM: ABDOMEN - 2 VIEW  COMPARISON:  07/21/2014  FINDINGS: NG tube tip is in the mid stomach. Decreasing small bowel distention PA continued mild small bowel prominence in the left upper quadrant. No organomegaly or free air. No suspicious calcification.  IMPRESSION: Improving small bowel obstruction pattern with NG tube in place.   Electronically Signed   By: Rolm Baptise M.D.   On: 07/22/2014 08:00    I have reviewed the patient's current medications.  Assessment/Plan: 1 AKI Contrast induced ATN.  High FENA c/w this.  Has some pyuria,will culture.  Acid base better with bicarb.  K controlled with bicarb and Kayex.  Nonoliguric now. Cr plateau at very low level 2 SBO ? etio 3 Esophageal Ca P IV isotonic bicarb, follow chem.  NG output    LOS: 4 days   DETERDING,JAMES L 07/23/2014,11:03 AM

## 2014-07-24 LAB — CBC
HEMATOCRIT: 38.6 % — AB (ref 39.0–52.0)
Hemoglobin: 13 g/dL (ref 13.0–17.0)
MCH: 27.8 pg (ref 26.0–34.0)
MCHC: 33.7 g/dL (ref 30.0–36.0)
MCV: 82.7 fL (ref 78.0–100.0)
Platelets: 173 10*3/uL (ref 150–400)
RBC: 4.67 MIL/uL (ref 4.22–5.81)
RDW: 15.1 % (ref 11.5–15.5)
WBC: 7.2 10*3/uL (ref 4.0–10.5)

## 2014-07-24 LAB — RENAL FUNCTION PANEL
Albumin: 2.6 g/dL — ABNORMAL LOW (ref 3.5–5.2)
Anion gap: 12 (ref 5–15)
BUN: 90 mg/dL — AB (ref 6–23)
CO2: 36 mEq/L — ABNORMAL HIGH (ref 19–32)
CREATININE: 5.38 mg/dL — AB (ref 0.50–1.35)
Calcium: 8 mg/dL — ABNORMAL LOW (ref 8.4–10.5)
Chloride: 94 mEq/L — ABNORMAL LOW (ref 96–112)
GFR calc Af Amer: 13 mL/min — ABNORMAL LOW (ref >90)
GFR calc non Af Amer: 11 mL/min — ABNORMAL LOW (ref >90)
Glucose, Bld: 151 mg/dL — ABNORMAL HIGH (ref 70–99)
Phosphorus: 4.6 mg/dL (ref 2.3–4.6)
Potassium: 3.5 mEq/L — ABNORMAL LOW (ref 3.7–5.3)
Sodium: 142 mEq/L (ref 137–147)

## 2014-07-24 LAB — OCCULT BLOOD X 1 CARD TO LAB, STOOL: Fecal Occult Bld: NEGATIVE

## 2014-07-24 LAB — URINE CULTURE
COLONY COUNT: NO GROWTH
CULTURE: NO GROWTH

## 2014-07-24 LAB — GLUCOSE, CAPILLARY: Glucose-Capillary: 139 mg/dL — ABNORMAL HIGH (ref 70–99)

## 2014-07-24 MED ORDER — POTASSIUM CHLORIDE 10 MEQ/100ML IV SOLN
10.0000 meq | INTRAVENOUS | Status: AC
  Administered 2014-07-24 (×3): 10 meq via INTRAVENOUS
  Filled 2014-07-24 (×3): qty 100

## 2014-07-24 MED ORDER — BISACODYL 10 MG RE SUPP: 10.0000 mg | Freq: Two times a day (BID) | RECTAL | Status: DC | PRN

## 2014-07-24 MED ORDER — SODIUM CHLORIDE 0.9 % IV SOLN
INTRAVENOUS | Status: AC
  Administered 2014-07-24 – 2014-07-25 (×2): via INTRAVENOUS

## 2014-07-24 MED ORDER — SODIUM CHLORIDE 0.9 % IV SOLN: 250.0000 mL | INTRAVENOUS | Status: DC | PRN

## 2014-07-24 MED ORDER — SODIUM CHLORIDE 0.9 % IJ SOLN: 3.0000 mL | Freq: Two times a day (BID) | INTRAMUSCULAR | Status: DC

## 2014-07-24 MED ORDER — LACTATED RINGERS IV BOLUS (SEPSIS): 1000.0000 mL | Freq: Three times a day (TID) | INTRAVENOUS | Status: AC | PRN

## 2014-07-24 MED ORDER — SACCHAROMYCES BOULARDII 250 MG PO CAPS
250.0000 mg | ORAL_CAPSULE | Freq: Two times a day (BID) | ORAL | Status: DC
  Administered 2014-07-24 – 2014-07-26 (×5): 250 mg via ORAL
  Filled 2014-07-24 (×6): qty 1

## 2014-07-24 MED ORDER — MENTHOL 3 MG MT LOZG
1.0000 | LOZENGE | OROMUCOSAL | Status: DC | PRN
  Filled 2014-07-24: qty 9

## 2014-07-24 MED ORDER — SODIUM CHLORIDE 0.9 % IJ SOLN: 3.0000 mL | INTRAMUSCULAR | Status: DC | PRN

## 2014-07-24 MED ORDER — LIP MEDEX EX OINT
1.0000 | TOPICAL_OINTMENT | Freq: Two times a day (BID) | CUTANEOUS | Status: DC
Start: 2014-07-24 — End: 2014-07-26
  Administered 2014-07-24 – 2014-07-25 (×4): 1 via TOPICAL
  Filled 2014-07-24: qty 7

## 2014-07-24 MED ORDER — MAGIC MOUTHWASH
15.0000 mL | Freq: Four times a day (QID) | ORAL | Status: DC | PRN
  Filled 2014-07-24: qty 15

## 2014-07-24 MED ORDER — ZOLPIDEM TARTRATE 5 MG PO TABS
5.0000 mg | ORAL_TABLET | Freq: Once | ORAL | Status: AC
  Administered 2014-07-24: 5 mg via ORAL
  Filled 2014-07-24: qty 1

## 2014-07-24 MED ORDER — ALUM & MAG HYDROXIDE-SIMETH 200-200-20 MG/5ML PO SUSP: 30.0000 mL | Freq: Four times a day (QID) | ORAL | Status: DC | PRN

## 2014-07-24 MED ORDER — PHENOL 1.4 % MT LIQD
2.0000 | OROMUCOSAL | Status: DC | PRN
  Filled 2014-07-24: qty 177

## 2014-07-24 MED ORDER — PANTOPRAZOLE SODIUM 40 MG PO TBEC
40.0000 mg | DELAYED_RELEASE_TABLET | Freq: Every day | ORAL | Status: DC | PRN
  Administered 2014-07-24: 40 mg via ORAL
  Filled 2014-07-24: qty 1

## 2014-07-24 NOTE — Progress Notes (Addendum)
[facility]  [address] [name], [name] Phone: [phone] FAX: [place] [phone] [birth year]  CARE TEAM:  PCP: No PCP Per Patient  Outpatient Care Team: Patient Care Team: No Pcp Per Patient as PCP - General (General Practice) Leandrew Koyanagi, MD (Family Medicine) Irene Shipper, MD (Gastroenterology) Milus Banister, MD (Gastroenterology) Ladell Pier, MD as Attending Physician (Hematology and Oncology) Marye Round, MD as Consulting Physician (Radiation Oncology)  Inpatient Treatment Team: Treatment Team: Attending Provider: Robbie Lis, MD; Technician: Elwanda Brooklyn, NT; Consulting Physician: Nolon Nations, MD; Rounding Team: Joycelyn Das, MD; Registered Nurse: Loyal Gambler, RN; Registered Nurse: Ashley Murrain, RN; Registered Nurse: Jacquelin Hawking, RN; Consulting Physician: Placido Sou, MD; Physician Assistant: Clinton Gallant, NT; Registered Nurse: Sibyl Parr, RN; Registered Nurse: Junior Marga Melnick, RN; Registered Nurse: Andre Lefort, RN   Subjective:  Feeling better.  No pain  Daughter in room.  Patient's speaks no [place].  Daughter is bilingual, comfortable as being the Optometrist.  Patient walking in hallways.  Objective:  Vital signs:  Filed Vitals:   07/23/14 0553 07/23/14 1431 07/23/14 2215 07/24/14 0500  BP: 117/65 127/78 129/57 124/71  Pulse: 91 78 77 80  Temp: 98.1 F (36.7 C) 98.2 F (36.8 C) 98.2 F (36.8 C) 97.7 F (36.5 C)  TempSrc: Oral Oral Oral Oral  Resp: 16 18 20 20   Height:      Weight: 118 lb 8 oz (53.751 kg)   125 lb 6.4 oz (56.881 kg)  SpO2: 95% 96% 96% 97%    Last BM Date: 07/23/14  Intake/Output   Yesterday:  09/05 0701 - 09/06 0700 In: 4955.8 [P.O.:300; I.V.:4295.8] Out: 1500 [Urine:1500] This shift:     Bowel function:  Flatus: y  BM: y  Drain: Minimal bilious  Physical Exam:  General: Pt  awake/alert/oriented x4 in no acute distress Eyes: PERRL, normal EOM.  Sclera clear.  No icterus Neuro: CN II-XII intact w/o focal sensory/motor deficits. Lymph: No head/neck/groin lymphadenopathy Psych:  No delerium/psychosis/paranoia HENT: Normocephalic, Mucus membranes moist.  No thrush.  NGT in place Neck: Supple, No tracheal deviation Chest: No chest wall pain w good excursion CV:  Pulses intact.  Regular rhythm MS: Normal AROM mjr joints.  No obvious deformity Abdomen: Soft.  Nondistended.  Nontender.  No evidence of peritonitis.  No incarcerated hernias. Ext:  SCDs BLE.  No mjr edema.  No cyanosis Skin: No petechiae / purpura   Problem List:   Principal Problem:   SBO (small bowel obstruction) Active Problems:   Malignant neoplasm of lower third of esophagus   Assessment  Bruce [name]  53 y.o. male       Partial small bowel obstruction most likely due to adhesions.  Improving clinically.  Plan:  -NGT removed by me - adv diet gradually -AKI/renal failure - BUN/Cr high but nonoliguric - defer to IM & renal -hyperK improved - follow closely -VTE prophylaxis- SCDs, etc -GERD - restart PPI to PO -mobilize as tolerated to help recovery  The patient is stable.  There is no evidence of peritonitis, acute abdomen, nor shock.  There is no strong evidence of failure of improvement nor decline with current non-operative management.  There is no need for surgery at the present moment.  We will continue to follow.   [name], M.D., F.A.C.S. Gastrointestinal and Minimally Invasive Surgery [facility] [address], [place], [place] [address] [phone] Main / Paging   07/24/2014   Results:   Labs: Results for orders placed during the hospital encounter of 07/19/14 (from the past 48 hour(s))  CK     Status: None   Collection Time    07/22/14  4:13 PM      Result Value Ref Range   Total CK 153  7 - 232 U/L  LACTATE  DEHYDROGENASE     Status: None   Collection Time    07/22/14  4:13 PM      Result Value Ref Range   LDH 234  94 - 250 U/L  URIC ACID     Status: Abnormal   Collection Time    07/22/14  4:13 PM      Result Value Ref Range   Uric Acid, Serum 12.8 (*) 4.0 - 7.8 mg/dL  RENAL FUNCTION PANEL     Status: Abnormal   Collection Time    07/22/14  4:14 PM      Result Value Ref Range   Sodium 143  137 - 147 mEq/L   Potassium 5.8 (*) 3.7 - 5.3 mEq/L   Chloride 101  96 - 112 mEq/L   CO2 17 (*) 19 - 32 mEq/L   Glucose, Bld 111 (*) 70 - 99 mg/dL   BUN 97 (*) 6 - 23 mg/dL   Creatinine, Ser 6.52 (*) 0.50 - 1.35 mg/dL   Calcium 9.2  8.4 - 10.5 mg/dL   Phosphorus 8.4 (*) 2.3 - 4.6 mg/dL   Albumin 3.0 (*) 3.5 - 5.2 g/dL   GFR calc non Af Amer 9 (*) >90 mL/min   GFR calc Af Amer 10 (*) >90 mL/min   Comment: (NOTE)     The eGFR has been calculated using the CKD EPI equation.     This calculation has not been validated in all clinical situations.     eGFR's persistently <90 mL/min signify possible Chronic Kidney     Disease.   Anion gap 25 (*) 5 - 15  POTASSIUM     Status: Abnormal   Collection Time    07/22/14  4:14 PM      Result Value Ref Range   Potassium 5.5 (*) 3.7 - 5.3 mEq/L  LACTIC ACID, PLASMA     Status: None   Collection Time    07/22/14  4:15 PM      Result Value Ref Range   Lactic Acid, Venous 1.6  0.5 - 2.2 mmol/L  URINALYSIS, ROUTINE W REFLEX MICROSCOPIC     Status: Abnormal   Collection Time    07/22/14  9:00 PM      Result Value Ref Range   Color, Urine YELLOW  YELLOW   APPearance CLOUDY (*) CLEAR   Specific Gravity, Urine 1.017  1.005 - 1.030   pH 5.0  5.0 - 8.0   Glucose, UA NEGATIVE  NEGATIVE mg/dL   Hgb urine dipstick MODERATE (*) NEGATIVE   Bilirubin Urine NEGATIVE  NEGATIVE   Ketones, ur NEGATIVE  NEGATIVE mg/dL   Protein, ur 100 (*) NEGATIVE mg/dL   Urobilinogen, UA 0.2  0.0 - 1.0 mg/dL   Nitrite NEGATIVE  NEGATIVE   Leukocytes, UA SMALL (*) NEGATIVE   SODIUM, URINE, RANDOM     Status: None   Collection Time    07/22/14  9:00 PM      Result Value Ref Range   Sodium, Ur 59  Comment: Performed at [facility], URINE, RANDOM     Status: None   Collection Time    07/22/14  9:00 PM      Result Value Ref Range   Creatinine, Urine 84.33     Comment: Performed at [facility] [place]     Status: Abnormal   Collection Time    07/22/14  9:00 PM      Result Value Ref Range   Squamous Epithelial / LPF RARE  RARE   WBC, UA 7-10  <3 WBC/hpf   RBC / HPF 11-20  <3 RBC/hpf   Bacteria, UA RARE  RARE   Casts HYALINE CASTS (*) NEGATIVE  CBC     Status: None   Collection Time    07/23/14  4:36 AM      Result Value Ref Range   WBC 8.8  4.0 - 10.5 K/uL   RBC 4.95  4.22 - 5.81 MIL/uL   Hemoglobin 13.9  13.0 - 17.0 g/dL   HCT 41.0  39.0 - 52.0 %   MCV 82.8  78.0 - 100.0 fL   MCH 28.1  26.0 - 34.0 pg   MCHC 33.9  30.0 - 36.0 g/dL   RDW 15.5  11.5 - 15.5 %   Platelets 223  150 - 400 K/uL  COMPREHENSIVE METABOLIC PANEL     Status: Abnormal   Collection Time    07/23/14  4:36 AM      Result Value Ref Range   Sodium 143  137 - 147 mEq/L   Potassium 4.7  3.7 - 5.3 mEq/L   Chloride 102  96 - 112 mEq/L   CO2 22  19 - 32 mEq/L   Glucose, Bld 132 (*) 70 - 99 mg/dL   BUN 106 (*) 6 - 23 mg/dL   Creatinine, Ser 6.42 (*) 0.50 - 1.35 mg/dL   Calcium 8.6  8.4 - 10.5 mg/dL   Total Protein 6.6  6.0 - 8.3 g/dL   Albumin 2.7 (*) 3.5 - 5.2 g/dL   AST 15  0 - 37 U/L   ALT 12  0 - 53 U/L   Alkaline Phosphatase 52  39 - 117 U/L   Total Bilirubin 0.5  0.3 - 1.2 mg/dL   GFR calc non Af Amer 9 (*) >90 mL/min   GFR calc Af Amer 10 (*) >90 mL/min   Comment: (NOTE)     The eGFR has been calculated using the CKD EPI equation.     This calculation has not been validated in all clinical situations.     eGFR's persistently <90 mL/min signify possible Chronic Kidney     Disease.   Anion gap 19 (*) 5 - 15  PHOSPHORUS      Status: Abnormal   Collection Time    07/23/14  4:36 AM      Result Value Ref Range   Phosphorus 6.7 (*) 2.3 - 4.6 mg/dL  GLUCOSE, CAPILLARY     Status: Abnormal   Collection Time    07/23/14  7:34 AM      Result Value Ref Range   Glucose-Capillary 139 (*) 70 - 99 mg/dL  CBC     Status: Abnormal   Collection Time    07/24/14  5:16 AM      Result Value Ref Range   WBC 7.2  4.0 - 10.5 K/uL   RBC 4.67  4.22 - 5.81 MIL/uL   Hemoglobin 13.0  13.0 - 17.0  g/dL   HCT 38.6 (*) 39.0 - 52.0 %   MCV 82.7  78.0 - 100.0 fL   MCH 27.8  26.0 - 34.0 pg   MCHC 33.7  30.0 - 36.0 g/dL   RDW 15.1  11.5 - 15.5 %   Platelets 173  150 - 400 K/uL  RENAL FUNCTION PANEL     Status: Abnormal   Collection Time    07/24/14  5:16 AM      Result Value Ref Range   Sodium 142  137 - 147 mEq/L   Potassium 3.5 (*) 3.7 - 5.3 mEq/L   Comment: DELTA CHECK NOTED     REPEATED TO VERIFY   Chloride 94 (*) 96 - 112 mEq/L   Comment: DELTA CHECK NOTED     REPEATED TO VERIFY   CO2 36 (*) 19 - 32 mEq/L   Glucose, Bld 151 (*) 70 - 99 mg/dL   BUN 90 (*) 6 - 23 mg/dL   Creatinine, Ser 5.38 (*) 0.50 - 1.35 mg/dL   Calcium 8.0 (*) 8.4 - 10.5 mg/dL   Phosphorus 4.6  2.3 - 4.6 mg/dL   Albumin 2.6 (*) 3.5 - 5.2 g/dL   GFR calc non Af Amer 11 (*) >90 mL/min   GFR calc Af Amer 13 (*) >90 mL/min   Comment: (NOTE)     The eGFR has been calculated using the CKD EPI equation.     This calculation has not been validated in all clinical situations.     eGFR's persistently <90 mL/min signify possible Chronic Kidney     Disease.   Anion gap 12  5 - 15  GLUCOSE, CAPILLARY     Status: Abnormal   Collection Time    07/24/14  7:19 AM      Result Value Ref Range   Glucose-Capillary 139 (*) 70 - 99 mg/dL    Imaging / Studies: US Renal  07/22/2014   CLINICAL DATA:  Nausea and abdominal pain. Small-bowel obstruction. Elevated creatinine. History of esophageal carcinoma.  EXAM: RENAL/URINARY TRACT ULTRASOUND COMPLETE  COMPARISON:   CT abdomen pelvis - 07/19/2014  FINDINGS: Right Kidney:  Normal cortical thickness, echogenicity and size, measuring 10.9 cm in length. No focal renal lesions. No echogenic renal stones. No urinary obstruction.  Left Kidney:  Normal cortical thickness, echogenicity and size, measuring 10.0 cm in length. No focal renal lesions. No echogenic renal stones. No urinary obstruction.  Bladder:  Decompressed with a Foley catheter.  Incidental note is made of a at least small size right-sided pleural effusion (image 3).  IMPRESSION: 1. No explanation for patient's elevated creatinine. Specifically, no evidence of urinary obstruction. 2. At least small sized right-sided pleural effusion incidentally noted, as seen on recently performed abdominal CT.   Electronically Signed   By: Sandi Mariscal M.D.   On: 07/22/2014 21:41    Medications / Allergies: per chart  Antibiotics: Anti-infectives   Start     Dose/Rate Route Frequency Ordered Stop   07/20/14 0230  piperacillin-tazobactam (ZOSYN) IVPB 3.375 g  Status:  Discontinued     3.375 g 12.5 mL/hr over 240 Minutes Intravenous Every 8 hours 07/20/14 0209 07/20/14 1822       Note: Portions of this report may have been transcribed using voice recognition software. Every effort was made to ensure accuracy; however, inadvertent computerized transcription errors may be present.   Any transcriptional errors that result from this process are unintentional.

## 2014-07-24 NOTE — Progress Notes (Signed)
Subjective: Interval History: has no complaint , feels better, tol some liquids.  Objective: Vital signs in last 24 hours: Temp:  [97.7 F (36.5 C)-98.2 F (36.8 C)] 97.7 F (36.5 C) (09/06 0500) Pulse Rate:  [77-80] 80 (09/06 0500) Resp:  [18-20] 20 (09/06 0500) BP: (124-129)/(57-78) 124/71 mmHg (09/06 0500) SpO2:  [96 %-97 %] 97 % (09/06 0500) Weight:  [56.881 kg (125 lb 6.4 oz)] 56.881 kg (125 lb 6.4 oz) (09/06 0500) Weight change: 3.13 kg (6 lb 14.4 oz)  Intake/Output from previous day: 09/05 0701 - 09/06 0700 In: 4955.8 [P.O.:300; I.V.:4295.8] Out: 1500 [Urine:1500] Intake/Output this shift:    General appearance: alert, cooperative and no distress Resp: rales bibasilar Cardio: S1, S2 normal and systolic murmur: holosystolic 2/6, blowing at apex GI: few bs, mild distension Extremities: edema Tr  Lab Results:  Recent Labs  07/23/14 0436 07/24/14 0516  WBC 8.8 7.2  HGB 13.9 13.0  HCT 41.0 38.6*  PLT 223 173   BMET:  Recent Labs  07/23/14 0436 07/24/14 0516  NA 143 142  K 4.7 3.5*  CL 102 94*  CO2 22 36*  GLUCOSE 132* 151*  BUN 106* 90*  CREATININE 6.42* 5.38*  CALCIUM 8.6 8.0*   No results found for this basename: PTH,  in the last 72 hours Iron Studies: No results found for this basename: IRON, TIBC, TRANSFERRIN, FERRITIN,  in the last 72 hours  Studies/Results: US Renal  07/22/2014   CLINICAL DATA:  Nausea and abdominal pain. Small-bowel obstruction. Elevated creatinine. History of esophageal carcinoma.  EXAM: RENAL/URINARY TRACT ULTRASOUND COMPLETE  COMPARISON:  CT abdomen pelvis - 07/19/2014  FINDINGS: Right Kidney:  Normal cortical thickness, echogenicity and size, measuring 10.9 cm in length. No focal renal lesions. No echogenic renal stones. No urinary obstruction.  Left Kidney:  Normal cortical thickness, echogenicity and size, measuring 10.0 cm in length. No focal renal lesions. No echogenic renal stones. No urinary obstruction.  Bladder:   Decompressed with a Foley catheter.  Incidental note is made of a at least small size right-sided pleural effusion (image 3).  IMPRESSION: 1. No explanation for patient's elevated creatinine. Specifically, no evidence of urinary obstruction. 2. At least small sized right-sided pleural effusion incidentally noted, as seen on recently performed abdominal CT.   Electronically Signed   By: Sandi Mariscal M.D.   On: 07/22/2014 21:41    I have reviewed the patient's current medications.  Assessment/Plan: 1 AKI improving GFr, bicarb ^, use Cl, needs less fluid 2 Esophaeal CA 3 SBO appears resolving P NS, follow Cr     LOS: 5 days   Mae Denunzio L 07/24/2014,10:34 AM

## 2014-07-24 NOTE — Progress Notes (Signed)
Patient ID: Bruce [name], male   DOB: December 13, 1960, 53 y.o.   MRN: [id number] [facility] PROGRESS NOTE  [name] UKG:[id number] DOB: 04-Oct-1961 DOA: 07/19/2014 PCP: No PCP Per Patient  Brief narrative: 53 y.o. 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 CT abdomen showed small bowel obstruction due to adhesions.   Assessment/Plan:   Principal Problem:  SBO (small bowel obstruction)  Secondary to adhesions. NG tube in place, clamped. Patient feels better. Abdominal x-ray done 07/22/2014 shows improving small bowel obstruction with NG tube in place. Patient is on clear liquid diet. Will followup with surgery recommendations on advancing the diet. Continue supportive care with IV fluids. Please note that IV fluid is of sodium bicarbonate in sterile water. Will change this to NS today since CO2 is 36 this am. Patient was initially on Zosyn but this was stopped 07/20/2014, no evidence of intra-abdominal infection. Active Problems:  Hyperkalemia  Potassium was 7 on 07/21/2014. Patient was given Lasix, insulin, D50 which brought the potassium to 6.3. This regimen was repeated and brought potassium to 5.3. He was then given insulin and D50 only and potassium normalized. On 07/22/2014 potassium again elevated at 6.5 and per renal we added kayexalate enema. Potassium normalized and then this morning patient has slight hypokalemia, 3.5.  We will supplement with potassium chloride 10 mEq IV for 3 doses. 12-lead EKG showed sinus tachycardia. Acute renal failure  Probably contrast-induced nephropathy from recent CAT scan 07/19/2014. Renal function initially within normal limit but trended up to 6.52.  Per renal, added sodium bicarb drip. Renal function is improving. We will stop the bicarbonate drip this morning because CO2 is 36. Malignant neoplasm of lower third of esophagus  Per oncology Leukocytosis / leukopenia  Likely stress de-margination. Repeat blood work showed normal WBC count  DVT Prophylaxis  SCD's bilaterally    Code Status: Full.  Family Communication: plan of care discussed with the patient and his family at the bedside.  Disposition Plan: Home when stable.   IV Access:   Peripheral IV Procedures and diagnostic studies:   Dg Chest 2 View 07/19/2014 1. Stable right pleural effusion and linear pulmonary opacities. No acute abnormality.  Ct Abdomen Pelvis W Contrast 07/19/2014 1. Mid small bowel obstruction without discrete mass or evident etiology, suggesting adhesions. 2. Stable moderate right pleural effusion.  Dg Abd 1 View 07/20/2014 1. Similar findings suggestive of high-grade small bowel obstruction. 2. Enteric tube side port overlies the distal esophagus. Advancement at least 8 cm is recommended.  Dg Abd 2 Views 07/21/2014 Persistent high-grade small bowel obstruction. No free air.  Dg Abd 2 Views 07/22/2014 Improving small bowel obstruction pattern with NG tube in place.  US Renal 07/22/2014 1. No explanation for patient's elevated creatinine. Specifically, no evidence of urinary obstruction. 2. At least small sized right-sided pleural effusion incidentally noted, as seen on recently performed abdominal CT.  Medical Consultants:   Surgery (Dr. Armandina Gemma)  Nephrology  Other Consultants:   Nutrition  Anti-Infectives:   None    Bruce [name],  Bruce Skeens, MD  [facility] Pager [phone]  If 7PM-7AM, please  contact night-coverage [email or web address] Password [email or web address] 07/24/2014, 9:28 AM   LOS: 5 days    HPI/Subjective: No acute overnight events.  Objective: Filed Vitals:   07/23/14 0553 07/23/14 1431 07/23/14 2215 07/24/14 0500  BP: 117/65 127/78 129/57 124/71  Pulse: 91 78 77 80  Temp: 98.1 F (36.7 C) 98.2 F (36.8 C) 98.2 F (36.8 C) 97.7 F (36.5 C)  TempSrc: Oral Oral Oral Oral  Resp: 16 18 20 20   Height:      Weight: 53.751 kg (118 lb 8 oz)   56.881 kg (125 lb 6.4 oz)  SpO2: 95% 96% 96% 97%    Intake/Output Summary (Last 24 hours) at 07/24/14 3154 Last data filed at 07/24/14 0500  Gross per 24 hour  Intake 4835.83 ml  Output   1500 ml  Net 3335.83 ml    Exam:   General:  Pt is awake, in no distress  Cardiovascular: Regular rate and rhythm, S1/S2, no murmurs  Respiratory: Bilateral air entry, no crackles or wheezing  Abdomen: NG tube is in place, clamped. Abdomen soft there and minimal tenderness to palpation, appreciated bowel sounds  Extremities: No edema, pulses DP and PT palpable bilaterally  Neuro: Grossly nonfocal  Data Reviewed: Basic Metabolic Panel:  Recent Labs Lab 07/21/14 0500  07/21/14 1940 07/22/14 1000 07/22/14 1614 07/23/14 0436 07/24/14 0516  NA 138  --   --  142 143 143 142  K 7.3*  < > 5.0 6.5* 5.8*  5.5* 4.7 3.5*  CL 100  --   --  102 101 102 94*  CO2 18*  --   --  17* 17* 22 36*  GLUCOSE 154*  < > 125* 87 111* 132* 151*  BUN 65*  --   --  90* 97* 106* 90*  CREATININE 4.30*  --   --  6.24* 6.52* 6.42* 5.38*  CALCIUM 8.5  --   --  9.1 9.2 8.6 8.0*  PHOS  --   --   --   --  8.4* 6.7* 4.6  < > = values in this interval not displayed. Liver Function Tests:  Recent Labs Lab 07/19/14 2043 07/21/14 0500 07/22/14 1614 07/23/14 0436 07/24/14 0516  AST 49* 21  --  15  --   ALT 18 13  --  12  --   ALKPHOS 124* 55  --  52  --   BILITOT 0.6 0.8  --  0.5  --   PROT 9.3* 8.2  --  6.6  --   ALBUMIN 4.8 3.6 3.0* 2.7* 2.6*    Recent  Labs Lab 07/19/14 2043  LIPASE 15   No results found for this basename: AMMONIA,  in the last 168 hours CBC:  Recent Labs Lab 07/19/14 2043 07/21/14 0500 07/22/14 1000 07/23/14 0436 07/24/14 0516  WBC 16.0* 2.8* 6.3 8.8 7.2  NEUTROABS 12.4*  --   --   --   --   HGB 15.7 15.9 15.7 13.9 13.0  HCT 44.9 47.2 44.5 41.0 38.6*  MCV 81.8 83.5 81.4 82.8 82.7  PLT 284 228 209 223 173   Cardiac Enzymes:  Recent Labs Lab 07/22/14 1613  CKTOTAL 153   BNP: No components found with this basename: POCBNP,  CBG:  Recent Labs Lab 07/21/14 1629 07/21/14 1955 07/22/14 0811 07/23/14 0734 07/24/14 0719  GLUCAP 110* 96 82 139* 139*    No results found for this or any previous visit (from  the past 240 hour(s)).   Scheduled Meds: . antiseptic oral rinse  7 mL Mouth Rinse BID  . pantoprazole (PROTONIX) IV  40 mg Intravenous Q12H  . potassium chloride  10 mEq Intravenous Q1 Hr x 3   Continuous Infusions: .  sodium bicarbonate 150 mEq in sterile water 1000 mL infusion 125 mL/hr at 07/24/14 0205

## 2014-07-25 LAB — COMPREHENSIVE METABOLIC PANEL
ALT: 15 U/L (ref 0–53)
ANION GAP: 11 (ref 5–15)
AST: 21 U/L (ref 0–37)
Albumin: 2.6 g/dL — ABNORMAL LOW (ref 3.5–5.2)
Alkaline Phosphatase: 54 U/L (ref 39–117)
BUN: 76 mg/dL — AB (ref 6–23)
CO2: 33 mEq/L — ABNORMAL HIGH (ref 19–32)
Calcium: 8.1 mg/dL — ABNORMAL LOW (ref 8.4–10.5)
Chloride: 97 mEq/L (ref 96–112)
Creatinine, Ser: 4.73 mg/dL — ABNORMAL HIGH (ref 0.50–1.35)
GFR calc non Af Amer: 13 mL/min — ABNORMAL LOW (ref >90)
GFR, EST AFRICAN AMERICAN: 15 mL/min — AB (ref >90)
GLUCOSE: 124 mg/dL — AB (ref 70–99)
Potassium: 4 mEq/L (ref 3.7–5.3)
Sodium: 141 mEq/L (ref 137–147)
TOTAL PROTEIN: 5.9 g/dL — AB (ref 6.0–8.3)
Total Bilirubin: 0.7 mg/dL (ref 0.3–1.2)

## 2014-07-25 LAB — CBC
HCT: 35.7 % — ABNORMAL LOW (ref 39.0–52.0)
HEMOGLOBIN: 11.7 g/dL — AB (ref 13.0–17.0)
MCH: 27.4 pg (ref 26.0–34.0)
MCHC: 32.8 g/dL (ref 30.0–36.0)
MCV: 83.6 fL (ref 78.0–100.0)
Platelets: 157 10*3/uL (ref 150–400)
RBC: 4.27 MIL/uL (ref 4.22–5.81)
RDW: 15 % (ref 11.5–15.5)
WBC: 6.1 10*3/uL (ref 4.0–10.5)

## 2014-07-25 LAB — PHOSPHORUS: PHOSPHORUS: 3.8 mg/dL (ref 2.3–4.6)

## 2014-07-25 LAB — GLUCOSE, CAPILLARY: Glucose-Capillary: 166 mg/dL — ABNORMAL HIGH (ref 70–99)

## 2014-07-25 MED ORDER — SODIUM CHLORIDE 0.9 % IV SOLN
INTRAVENOUS | Status: DC
  Administered 2014-07-26: 04:00:00 via INTRAVENOUS

## 2014-07-25 MED ORDER — OXYCODONE-ACETAMINOPHEN 5-325 MG PO TABS: 1.0000 | ORAL_TABLET | ORAL | Status: DC | PRN

## 2014-07-25 NOTE — Progress Notes (Signed)
Patient ID: Bruce [name], male   DOB: 1960-12-26, 53 y.o.   MRN: [id number] [facility] PROGRESS NOTE  Bruce [name] YPP:[id number] DOB: [birth year] DOA: 07/19/2014 PCP: No PCP Per Patient  Brief narrative: 53 y.o. 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 CT abdomen showed small bowel obstruction due to adhesions.   Assessment/Plan:    Principal Problem:  SBO (small bowel obstruction)  Secondary to adhesions. NG tube in place, clamped. Patient feels better. Abdominal x-ray done 07/22/2014 showed improving small bowel obstruction with NG tube. This am, pt has no NG tube. His diet is soft mechanical. Tolerates OK. Patient was initially on Zosyn but this was stopped 07/20/2014, no evidence of intra-abdominal infection. Active Problems:  Hyperkalemia  Potassium was 7 on 07/21/2014. Patient was given Lasix, insulin, D50 which brought the potassium to 6.3. This regimen was repeated and brought potassium to 5.3. He was then given insulin and D50 only and potassium normalized. On 07/22/2014 potassium again elevated at 6.5 and per renal we added kayexalate enema. Potassium normalized and then slight hypokalemia, 3.5. We supplemented with potassium chloride 10 mEq IV for 3 doses.  12-lead EKG showed sinus tachycardia. Acute renal failure  Probably contrast-induced nephropathy from recent CAT scan 07/19/2014. Renal function initially  within normal limit but trended up to 6.52.  Per renal, added sodium bicarb drip. Renal function is improving. We stopped the bicarbonate drip 07/24/2014 because CO2 was 36. Malignant neoplasm of lower third of esophagus  Per oncology Leukocytosis / leukopenia  Likely stress de-margination. Repeat blood work showed normal WBC count  DVT Prophylaxis  SCD's bilaterally    Code Status: Full.  Family Communication: plan of care discussed with the patient and his family at the bedside.  Disposition Plan: Home when stable likely in next 24 hours.  IV Access:   Peripheral IV Procedures and diagnostic studies:   Dg Chest 2 View 07/19/2014 1. Stable right pleural effusion and linear pulmonary opacities. No acute abnormality.  Ct Abdomen Pelvis W Contrast 07/19/2014 1. Mid small bowel obstruction without discrete mass or evident etiology, suggesting adhesions. 2. Stable moderate right pleural effusion.  Dg Abd 1 View 07/20/2014 1. Similar findings suggestive of high-grade small bowel obstruction. 2. Enteric tube side port overlies the distal esophagus. Advancement at least 8 cm is recommended.  Dg Abd 2 Views 07/21/2014 Persistent high-grade small bowel obstruction. No free air.  Dg Abd 2 Views 07/22/2014 Improving small bowel obstruction pattern with NG tube in place.  US Renal 07/22/2014 1. No explanation for patient's elevated creatinine. Specifically, no evidence of urinary obstruction. 2. At least small sized right-sided pleural effusion incidentally noted, as seen on recently performed abdominal CT.  Medical Consultants:   Surgery (Dr. Armandina Gemma)  Nephrology  Other Consultants:   Nutrition  Anti-Infectives:   None     Bruce Lenz, MD  [facility] Pager [phone]  If 7PM-7AM, please contact night-coverage [email or web address] Password [email or web address] 07/25/2014, 5:22 PM   LOS: 6 days    HPI/Subjective: No acute overnight events.  Objective: Filed Vitals:   07/24/14 1325 07/24/14 2114 07/25/14  0528 07/25/14 1359  BP: 120/74 119/70 126/72 137/62  Pulse: 74 72 72 69  Temp: 98.5 F (36.9 C) 99.5 F (37.5 C) 98.4 F (36.9 C) 98.5 F (36.9 C)  TempSrc: Oral Oral Oral Oral  Resp: 20 20 18 18   Height:      Weight:   59.6 kg (131 lb 6.3 oz)   SpO2: 98% 96% 96% 97%    Intake/Output Summary (Last 24 hours) at 07/25/14 1722 Last data filed at 07/25/14 1619  Gross per 24 hour  Intake   1760 ml  Output   2450 ml  Net   -690 ml    Exam:   General:  Pt is alert, not in acute distress  Cardiovascular: Regular rate and rhythm, S1/S2 appreciated   Respiratory: Clear to auscultation bilaterally, no wheezing  Abdomen: non tender, bowel sounds present  Extremities: No edema, pulses DP and PT palpable bilaterally  Neuro: Grossly nonfocal  Data Reviewed: Basic Metabolic Panel:  Recent Labs Lab 07/22/14 1000 07/22/14 1614 07/23/14 0436 07/24/14 0516 07/25/14 0431  NA 142 143 143 142 141  K 6.5* 5.8*  5.5* 4.7 3.5* 4.0  CL 102 101 102 94* 97  CO2 17* 17* 22 36* 33*  GLUCOSE 87 111* 132* 151* 124*  BUN 90* 97* 106* 90* 76*  CREATININE 6.24* 6.52* 6.42* 5.38* 4.73*  CALCIUM 9.1 9.2 8.6 8.0* 8.1*  PHOS  --  8.4* 6.7* 4.6 3.8   Liver Function Tests:  Recent Labs Lab 07/19/14 2043 07/21/14 0500 07/22/14 1614 07/23/14 0436 07/24/14 0516 07/25/14 0431  AST 49* 21  --  15  --  21  ALT 18 13  --  12  --  15  ALKPHOS 124* 55  --  52  --  54  BILITOT 0.6 0.8  --  0.5  --  0.7  PROT 9.3* 8.2  --  6.6  --  5.9*  ALBUMIN 4.8 3.6 3.0* 2.7* 2.6* 2.6*    Recent Labs Lab 07/19/14 2043  LIPASE 15   No results found for this basename: AMMONIA,  in the last 168 hours CBC:  Recent Labs Lab 07/19/14 2043 07/21/14 0500 07/22/14 1000 07/23/14 0436 07/24/14 0516 07/25/14 0431  WBC 16.0* 2.8* 6.3 8.8 7.2 6.1  NEUTROABS 12.4*  --   --   --   --   --   HGB 15.7 15.9 15.7 13.9 13.0 11.7*  HCT 44.9 47.2 44.5 41.0 38.6* 35.7*  MCV 81.8 83.5 81.4 82.8 82.7 83.6  PLT  284 228 209 223 173 157   Cardiac Enzymes:  Recent Labs Lab 07/22/14 1613  CKTOTAL 153   BNP: No components found with this basename: POCBNP,  CBG:  Recent Labs Lab 07/21/14 1955 07/22/14 0811 07/23/14 0734 07/24/14 0719 07/25/14 0747  GLUCAP 96 82 139* 139* 166*    Recent Results (from the past 240 hour(s))  URINE CULTURE     Status: None   Collection Time    07/23/14 12:31 PM      Result Value Ref Range Status   Specimen Description URINE, CATHETERIZED   Final   Special Requests Immunocompromised   Final   Culture  Setup Time     Final   Value: 07/23/2014 19:56     Performed at [facility]     Final   Value: NO GROWTH     Performed at [facility]  Culture     Final   Value: NO GROWTH     Performed at [facility]   Report Status 07/24/2014 FINAL   Final     Scheduled Meds: . antiseptic oral rinse  7 mL Mouth Rinse BID  . lip balm  1 application Topical BID  . saccharomyces boulardii  250 mg Oral BID  . sodium chloride  3 mL Intravenous Q12H   Continuous Infusions: . sodium chloride

## 2014-07-25 NOTE — Progress Notes (Signed)
  [facility] Progress Note   Subjective: Feeling better, walked in halls, eating some solid food now, + BM. Creat down at 4.7 today  Filed Vitals:   07/24/14 1325 07/24/14 2114 07/25/14 0528 07/25/14 1359  BP: 120/74 119/70 126/72 137/62  Pulse: 74 72 72 69  Temp: 98.5 F (36.9 C) 99.5 F (37.5 C) 98.4 F (36.9 C) 98.5 F (36.9 C)  TempSrc: Oral Oral Oral Oral  Resp: 20 20 18 18   Height:      Weight:   59.6 kg (131 lb 6.3 oz)   SpO2: 98% 96% 96% 97%   Exam: Alert, cooperative, no distress Bibasilar rales  RRR 2/6 syst M Abd soft, NTND Trace LE edema Neuro is appropriate  UA 9/4 - 7-10 wbc, 11-20 rbc CT scan abd 9/1 with 100 cc IV contrast  Summary: 53 yo [place] male hx esophagectomy w pull-through Oct 2013 for SCCa of esophagus (preop chemo/radiation). Had recurrent disease R lung and had R upper lobectomy by VATS in Mar 2015.  Seen by ONC May 2015, no clear role for systemic chemo Rx , recommended observation with next restaging CT of chest in 3 months. Admitted 9/1 with n/v and CT showed SBO.  + contrast.  Developed AKI felt due to IV contrast (given with abd CT on 9.1, admit creat 0.95) with peak Cr 6.4 on 9/4, now declining.   Assessment: 1 AKI- improving 2 Volume - euvolemic 3 Hx esoph cancer 4 SBO seems to be resolving  Plan- cont NS, follow Cr, will need PCP for f/u at discharge (dtr says he has no PCP)    Kelly Splinter MD  pager [phone]    cell [phone]  07/25/2014, 3:22 PM     Recent Labs Lab 07/23/14 0436 07/24/14 0516 07/25/14 0431  NA 143 142 141  K 4.7 3.5* 4.0  CL 102 94* 97  CO2 22 36* 33*  GLUCOSE 132* 151* 124*  BUN 106* 90* 76*  CREATININE 6.42* 5.38* 4.73*  CALCIUM 8.6 8.0* 8.1*  PHOS 6.7* 4.6 3.8    Recent Labs Lab 07/21/14 0500  07/23/14 0436 07/24/14 0516 07/25/14 0431  AST 21  --  15  --  21  ALT 13  --  12  --  15  ALKPHOS 55  --  52  --  54  BILITOT 0.8  --  0.5  --  0.7  PROT 8.2  --  6.6  --   5.9*  ALBUMIN 3.6  < > 2.7* 2.6* 2.6*  < > = values in this interval not displayed.  Recent Labs Lab 07/19/14 2043  07/23/14 0436 07/24/14 0516 07/25/14 0431  WBC 16.0*  < > 8.8 7.2 6.1  NEUTROABS 12.4*  --   --   --   --   HGB 15.7  < > 13.9 13.0 11.7*  HCT 44.9  < > 41.0 38.6* 35.7*  MCV 81.8  < > 82.8 82.7 83.6  PLT 284  < > 223 173 157  < > = values in this interval not displayed. Marland Kitchen antiseptic oral rinse  7 mL Mouth Rinse BID  . lip balm  1 application Topical BID  . saccharomyces boulardii  250 mg Oral BID  . sodium chloride  3 mL Intravenous Q12H     sodium chloride, alum & mag hydroxide-simeth, bisacodyl, HYDROmorphone (DILAUDID) injection, lactated ringers, lip balm, magic mouthwash, menthol-cetylpyridinium, ondansetron (ZOFRAN) IV, pantoprazole, phenol, promethazine, sodium chloride

## 2014-07-25 NOTE — Progress Notes (Signed)
[facility]  [address] [place], [place] [address] Phone: [phone] FAX: [place] [phone] 1961-11-04  CARE TEAM:  PCP: No PCP Per Patient  Outpatient Care Team: Patient Care Team: No Pcp Per Patient as PCP - General (General Practice) Leandrew Koyanagi, MD (Family Medicine) Irene Shipper, MD (Gastroenterology) Milus Banister, MD (Gastroenterology) Ladell Pier, MD as Attending Physician (Hematology and Oncology) Marye Round, MD as Consulting Physician (Radiation Oncology)  Inpatient Treatment Team: Treatment Team: Attending Provider: Robbie Lis, MD; Technician: Elwanda Brooklyn, NT; Consulting Physician: Nolon Nations, MD; Rounding Team: Joycelyn Das, MD; Registered Nurse: Ashley Murrain, RN; Registered Nurse: Jacquelin Hawking, RN; Consulting Physician: Placido Sou, MD; Physician Assistant: Clinton Gallant, NT; Registered Nurse: Andre Lefort, RN; Registered Nurse: Campbell Lerner, RN; Registered Nurse: Blanchard Kelch, RN; Technician: Olevia Bowens, [place]; Respiratory Therapist: Gonzella Lex, RRT   Subjective:  Feeling better.  No pain  Family in room.  Patient's speaks no [place].  Daughter is bilingual, comfortable as being the Optometrist.  Patient walking in hallways.  RN in room  Tol full liquids   Objective:  Vital signs:  Filed Vitals:   07/24/14 0500 07/24/14 1325 07/24/14 2114 07/25/14 0528  BP: 124/71 120/74 119/70 126/72  Pulse: 80 74 72 72  Temp: 97.7 F (36.5 C) 98.5 F (36.9 C) 99.5 F (37.5 C) 98.4 F (36.9 C)  TempSrc: Oral Oral Oral Oral  Resp: _0 Height:      Weight: 125 lb 6.4 oz (56.881 kg)   131 lb 6.3 oz (59.6 kg)  SpO2: 97% 98% 96% 96%    Last BM Date: 07/23/14  Intake/Output   Yesterday:  09/06 0701 - 09/07 0700 In: 2795 [P.O.:1200; I.V.:1395; IV Piggyback:200] Out: 3050 [Urine:3050] This shift:     Bowel  function:  Flatus: y  BM: y  Drain: n/a  Physical Exam:  General: Pt awake/alert/oriented x4 in no acute distress Eyes: PERRL, normal EOM.  Sclera clear.  No icterus Neuro: CN II-XII intact w/o focal sensory/motor deficits. Lymph: No head/neck/groin lymphadenopathy Psych:  No delerium/psychosis/paranoia HENT: Normocephalic, Mucus membranes moist.  No thrush.  NGT in place Neck: Supple, No tracheal deviation Chest: No chest wall pain w good excursion CV:  Pulses intact.  Regular rhythm MS: Normal AROM mjr joints.  No obvious deformity Abdomen: Soft.  Nondistended.  Nontender.  No evidence of peritonitis.  No incarcerated hernias. Ext:  SCDs BLE.  No mjr edema.  No cyanosis Skin: No petechiae / purpura   Problem List:   Principal Problem:   SBO (small bowel obstruction) Active Problems:   Malignant neoplasm of lower third of esophagus   Assessment  Bruce [name]  53 y.o. male       Partial small bowel obstruction most likely due to adhesions.  Improving clinically.  Plan:  -adv to solids -AKI/renal failure - BUN/Cr high but nonoliguric - d/c foley.  Defer to IM & renal -hyperK improved - follow closely -VTE prophylaxis- SCDs, etc -GERD - restart PPI to PO -mobilize as tolerated to help recovery  D/C patient from hospital from surgery standpoint when patient meets criteria (anticipate in 1 day(s)):  Tolerating oral intake well Ambulating in walkways Adequate pain control without IV medications Urinating (may need to stay longer if Nephrology/Med wishes Cr to be WNL) Having flatus   The patient is stable.  There is no evidence of peritonitis, acute abdomen, nor shock.  There is no strong evidence of failure of improvement nor decline with current non-operative management.  There is no need for surgery at the present moment.  We will continue to follow.   [name], M.D., F.A.C.S. Gastrointestinal and Minimally Invasive Surgery [facility] [address] [place], [place] [address] [phone] Main / Paging   07/25/2014   Results:   Labs: Results for orders placed during the hospital encounter of 07/19/14 (from the past 48 hour(s))  URINE CULTURE     Status: None   Collection Time    07/23/14 12:31 PM      Result Value Ref Range   Specimen Description URINE, CATHETERIZED     Special Requests Immunocompromised     Culture  Setup Time       Value: 07/23/2014 19:56     Performed at [facility]       Value: NO GROWTH     Performed at [facility]   Culture       Value: NO GROWTH     Performed at [facility]   Report Status 07/24/2014 FINAL    CBC     Status: Abnormal   Collection Time    07/24/14  5:16 AM      Result Value Ref Range   WBC 7.2  4.0 - 10.5 K/uL   RBC 4.67  4.22 - 5.81 MIL/uL   Hemoglobin 13.0  13.0 - 17.0 g/dL   HCT 38.6 (*) 39.0 - 52.0 %   MCV 82.7  78.0 - 100.0 fL   MCH 27.8  26.0 - 34.0 pg   MCHC 33.7  30.0 - 36.0 g/dL   RDW 15.1  11.5 - 15.5 %   Platelets 173  150 - 400 K/uL  RENAL FUNCTION PANEL     Status: Abnormal   Collection Time    07/24/14  5:16 AM      Result Value Ref Range   Sodium 142  137 - 147 mEq/L   Potassium 3.5 (*) 3.7 - 5.3 mEq/L   Comment: DELTA CHECK NOTED     REPEATED TO VERIFY   Chloride 94 (*) 96 - 112 mEq/L   Comment: DELTA CHECK NOTED     REPEATED TO VERIFY   CO2 36 (*) 19 - 32 mEq/L   Glucose, Bld 151 (*) 70 - 99 mg/dL   BUN 90 (*) 6 - 23 mg/dL   Creatinine, Ser 5.38 (*) 0.50 - 1.35 mg/dL   Calcium 8.0 (*) 8.4 - 10.5 mg/dL   Phosphorus 4.6  2.3 - 4.6 mg/dL   Albumin 2.6 (*) 3.5 - 5.2 g/dL   GFR calc non Af Amer 11 (*) >90 mL/min   GFR calc Af Amer 13 (*) >90 mL/min   Comment: (NOTE)     The eGFR has been calculated using the CKD EPI equation.     This calculation has not been validated in all clinical situations.     eGFR's persistently <90 mL/min signify possible Chronic Kidney      Disease.   Anion gap 12  5 - 15  GLUCOSE, CAPILLARY     Status: Abnormal   Collection Time    07/24/14  7:19 AM      Result Value Ref Range   Glucose-Capillary 139 (*) 70 - 99 mg/dL  OCCULT BLOOD X 1 CARD TO LAB, STOOL  Status: None   Collection Time    07/24/14 12:22 PM      Result Value Ref Range   Fecal Occult Bld NEGATIVE  NEGATIVE  COMPREHENSIVE METABOLIC PANEL     Status: Abnormal   Collection Time    07/25/14  4:31 AM      Result Value Ref Range   Sodium 141  137 - 147 mEq/L   Potassium 4.0  3.7 - 5.3 mEq/L   Chloride 97  96 - 112 mEq/L   CO2 33 (*) 19 - 32 mEq/L   Glucose, Bld 124 (*) 70 - 99 mg/dL   BUN 76 (*) 6 - 23 mg/dL   Creatinine, Ser 4.73 (*) 0.50 - 1.35 mg/dL   Calcium 8.1 (*) 8.4 - 10.5 mg/dL   Total Protein 5.9 (*) 6.0 - 8.3 g/dL   Albumin 2.6 (*) 3.5 - 5.2 g/dL   AST 21  0 - 37 U/L   ALT 15  0 - 53 U/L   Alkaline Phosphatase 54  39 - 117 U/L   Total Bilirubin 0.7  0.3 - 1.2 mg/dL   GFR calc non Af Amer 13 (*) >90 mL/min   GFR calc Af Amer 15 (*) >90 mL/min   Comment: (NOTE)     The eGFR has been calculated using the CKD EPI equation.     This calculation has not been validated in all clinical situations.     eGFR's persistently <90 mL/min signify possible Chronic Kidney     Disease.   Anion gap 11  5 - 15  CBC     Status: Abnormal   Collection Time    07/25/14  4:31 AM      Result Value Ref Range   WBC 6.1  4.0 - 10.5 K/uL   RBC 4.27  4.22 - 5.81 MIL/uL   Hemoglobin 11.7 (*) 13.0 - 17.0 g/dL   HCT 35.7 (*) 39.0 - 52.0 %   MCV 83.6  78.0 - 100.0 fL   MCH 27.4  26.0 - 34.0 pg   MCHC 32.8  30.0 - 36.0 g/dL   RDW 15.0  11.5 - 15.5 %   Platelets 157  150 - 400 K/uL  PHOSPHORUS     Status: None   Collection Time    07/25/14  4:31 AM      Result Value Ref Range   Phosphorus 3.8  2.3 - 4.6 mg/dL  GLUCOSE, CAPILLARY     Status: Abnormal   Collection Time    07/25/14  7:47 AM      Result Value Ref Range   Glucose-Capillary 166 (*) 70 - 99 mg/dL     Imaging / Studies: No results found.  Medications / Allergies: per chart  Antibiotics: Anti-infectives   Start     Dose/Rate Route Frequency Ordered Stop   07/20/14 0230  piperacillin-tazobactam (ZOSYN) IVPB 3.375 g  Status:  Discontinued     3.375 g 12.5 mL/hr over 240 Minutes Intravenous Every 8 hours 07/20/14 0209 07/20/14 1822       Note: Portions of this report may have been transcribed using voice recognition software. Every effort was made to ensure accuracy; however, inadvertent computerized transcription errors may be present.   Any transcriptional errors that result from this process are unintentional.

## 2014-07-26 LAB — BASIC METABOLIC PANEL
Anion gap: 11 (ref 5–15)
BUN: 60 mg/dL — ABNORMAL HIGH (ref 6–23)
CHLORIDE: 100 meq/L (ref 96–112)
CO2: 29 mEq/L (ref 19–32)
Calcium: 8.1 mg/dL — ABNORMAL LOW (ref 8.4–10.5)
Creatinine, Ser: 3.96 mg/dL — ABNORMAL HIGH (ref 0.50–1.35)
GFR calc Af Amer: 19 mL/min — ABNORMAL LOW (ref >90)
GFR calc non Af Amer: 16 mL/min — ABNORMAL LOW (ref >90)
Glucose, Bld: 115 mg/dL — ABNORMAL HIGH (ref 70–99)
Potassium: 4.2 mEq/L (ref 3.7–5.3)
SODIUM: 140 meq/L (ref 137–147)

## 2014-07-26 LAB — GLUCOSE, CAPILLARY: Glucose-Capillary: 134 mg/dL — ABNORMAL HIGH (ref 70–99)

## 2014-07-26 MED ORDER — SACCHAROMYCES BOULARDII 250 MG PO CAPS: 250.0000 mg | ORAL_CAPSULE | Freq: Two times a day (BID) | ORAL | Status: DC

## 2014-07-26 MED ORDER — BISACODYL 10 MG RE SUPP: 10.0000 mg | Freq: Two times a day (BID) | RECTAL | Status: DC | PRN

## 2014-07-26 MED ORDER — MAGIC MOUTHWASH: 15.0000 mL | Freq: Four times a day (QID) | ORAL | Status: DC | PRN

## 2014-07-26 MED ORDER — ALUM & MAG HYDROXIDE-SIMETH 200-200-20 MG/5ML PO SUSP: 30.0000 mL | Freq: Four times a day (QID) | ORAL | Status: DC | PRN

## 2014-07-26 MED ORDER — CETYLPYRIDINIUM CHLORIDE 0.05 % MT LIQD: 7.0000 mL | Freq: Two times a day (BID) | OROMUCOSAL | Status: DC

## 2014-07-26 NOTE — Progress Notes (Signed)
Discharge instructions explained to daughter, prescriptions given. Daughter verbalizes understanding of same. IV removed, stable for discharge.

## 2014-07-26 NOTE — Discharge Summary (Signed)
Physician Discharge Summary  Bruce [name] TKW:[id number] DOB: Oct 23, 1961 DOA: 07/19/2014  PCP: No PCP Per Patient  Admit date: 07/19/2014 Discharge date: 07/26/2014  Recommendations for Outpatient Follow-up:  1. Patient will followup in cancer Center per scheduled appointment. 2. Blood work, BMP and CBC can be done in [place]. Specifically needs followup creatinine to make sure it is further improving. Potassium is within normal limits prior to discharge.  Discharge Diagnoses:  Principal Problem:   SBO (small bowel obstruction) Active Problems:   Malignant neoplasm of lower third of esophagus    Discharge Condition: stable   Diet recommendation: as tolerated   History of present illness:  53 y.o. 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 CT abdomen showed small bowel obstruction due to adhesions.   Assessment/Plan:   Principal Problem:  SBO (small bowel obstruction)  Secondary to adhesions. Patient improved with conservative management, IV fluids, NG tube, analgesia and antiemetics as needed. Abdominal x-ray done 07/22/2014 showed improving small bowel obstruction with NG tube.  Patient tolerated soft diet. Patient is medically stable for discharge. Patient was initially on Zosyn but this was stopped 07/20/2014, no evidence of intra-abdominal infection. Active Problems:  Hyperkalemia  Potassium was 7 on 07/21/2014.  Patient was given Lasix, insulin, D50 which brought the potassium to 6.3. This regimen was repeated and brought potassium to 5.3. He was then given insulin and D50 only and potassium normalized. On 07/22/2014 potassium again elevated at 6.5 and per renal we added kayexalate enema. Potassium normalized and then slight hypokalemia, 3.5. We supplemented with potassium chloride 10 mEq IV for 3 doses. Potassium now within normal limits. 12-lead EKG showed sinus tachycardia. Acute renal failure  Probably contrast-induced nephropathy from recent CAT scan 07/19/2014. Renal function initially within normal limit but trended up to 6.52.  Per renal, added sodium bicarb drip. Renal function is improving. We stopped the bicarbonate drip 07/24/2014 because CO2 was 36. Malignant neoplasm of lower third of esophagus  Per oncology. Appointment scheduled. Leukocytosis / leukopenia  Likely stress de-margination. Repeat blood work showed normal WBC count  DVT Prophylaxis  SCD's bilaterally    Code Status: Full.  Family Communication: plan of care discussed with the patient and his family at the bedside.   IV Access:   Peripheral IV Procedures and diagnostic studies:   Dg Chest 2 View 07/19/2014 1. Stable right pleural effusion and linear pulmonary opacities. No acute abnormality.  Ct Abdomen Pelvis W Contrast 07/19/2014 1. Mid small bowel obstruction without discrete mass or evident etiology, suggesting adhesions. 2. Stable moderate right pleural effusion.  Dg Abd 1 View 07/20/2014 1. Similar findings suggestive of high-grade small bowel obstruction. 2. Enteric tube side port overlies the distal esophagus. Advancement at least 8 cm is recommended.  Dg Abd 2 Views 07/21/2014 Persistent high-grade small bowel obstruction. No free air.  Dg Abd 2 Views 07/22/2014 Improving small bowel obstruction pattern with NG tube in place.  US Renal 07/22/2014 1. No explanation for patient's elevated creatinine. Specifically, no evidence of  urinary obstruction. 2.  At least small sized right-sided pleural effusion incidentally noted, as seen on recently performed abdominal CT.  Medical Consultants:   Surgery (Dr. Armandina Gemma)  Nephrology  Other Consultants:   Nutrition  Anti-Infectives:   None    Signed:  Leisa Lenz, MD  [facility] 07/26/2014, 10:15 AM  Pager #: [phone]   Discharge Exam: Filed Vitals:   07/26/14 0501  BP: 126/72  Pulse: 69  Temp: 98.1 F (36.7 C)  Resp: 20   Filed Vitals:   07/25/14 0528 07/25/14 1359 07/25/14 2028 07/26/14 0501  BP: 126/72 137/62 134/71 126/72  Pulse: 72 69 81 69  Temp: 98.4 F (36.9 C) 98.5 F (36.9 C) 99.2 F (37.3 C) 98.1 F (36.7 C)  TempSrc: Oral Oral Oral Oral  Resp: 18 18 20 20   Height:      Weight: 59.6 kg (131 lb 6.3 oz)   55.2 kg (121 lb 11.1 oz)  SpO2: 96% 97% 98% 97%    General: Pt is alert, follows commands appropriately, not in acute distress Cardiovascular: Regular rate and rhythm, S1/S2 +, no murmurs Respiratory: Clear to auscultation bilaterally, no wheezing, no crackles, no rhonchi Abdominal: Soft, non tender, non distended, bowel sounds +, no guarding Extremities: no edema, no cyanosis, pulses palpable bilaterally DP and PT Neuro: Grossly nonfocal  Discharge Instructions  Discharge Instructions   Call MD for:  difficulty breathing, headache or visual disturbances    Complete by:  As directed      Call MD for:  persistant dizziness or light-headedness    Complete by:  As directed      Call MD for:  persistant nausea and vomiting    Complete by:  As directed      Call MD for:  severe uncontrolled pain    Complete by:  As directed      Diet - low sodium heart healthy    Complete by:  As directed      Increase activity slowly    Complete by:  As directed             Medication List         alum & mag hydroxide-simeth [phone] MG/5ML suspension  Commonly known as:  MAALOX/MYLANTA  Take 30 mLs by mouth every 6 (six)  hours as needed for indigestion or heartburn (or bloating).     antiseptic oral rinse 0.05 % Liqd solution  Commonly known as:  CPC / CETYLPYRIDINIUM CHLORIDE 0.05%  7 mLs by Mouth Rinse route 2 (two) times daily.     bisacodyl 10 MG suppository  Commonly known as:  DULCOLAX  Place 1 suppository (10 mg total) rectally every 12 (twelve) hours as needed for mild constipation or moderate constipation.     magic mouthwash Soln  Take 15 mLs by mouth 4 (four) times daily as needed for mouth pain (sore throat).     oxyCODONE-acetaminophen 5-325 MG per tablet  Commonly known as:  PERCOCET  Take 1-2 tablets by mouth every 4 (four) hours as needed for moderate pain or severe pain.     pantoprazole 40 MG tablet  Commonly known as:  PROTONIX  Take 40 mg by mouth daily as needed (heart burn).     saccharomyces boulardii 250 MG capsule  Commonly known as:  FLORASTOR  Take 1 capsule (250 mg total) by mouth 2 (two) times daily.           Follow-up Information   Follow up with Betsy Coder, MD On 07/29/2014. (Follow  up appt after recent hospitalization; 1:45 pm)    Specialty:  Oncology   Contact information:   [place] [address] [phone]        The results of significant diagnostics from this hospitalization (including imaging, microbiology, ancillary and laboratory) are listed below for reference.    Significant Diagnostic Studies: Dg Chest 2 View  07/19/2014   CLINICAL DATA:  ABDOMINAL PAIN  EXAM: CHEST - 2 VIEW  COMPARISON:  07/04/2014 and earlier studies  FINDINGS: Small right pleural effusion. Stable linear opacities in the right mid lung and to a lesser degree at both lung bases. Heart size remains normal.  Visualized skeletal structures are unremarkable.  IMPRESSION: 1. Stable right pleural effusion and linear pulmonary opacities. No acute abnormality.   Electronically Signed   By: Arne Cleveland M.D.   On: 07/19/2014 23:11   Dg Abd 1 View  07/20/2014    CLINICAL DATA:  Abdominal pain, evaluate small bowel obstruction  EXAM: ABDOMEN - 1 VIEW  COMPARISON:  CT abdomen pelvis - 07/19/2014  FINDINGS: There is persistent moderate to marked gas distention of multiple loops of small bowel with index loop measuring approximately 5 cm in diameter. There is a conspicuous paucity of distal colonic gas. No supine evidence of pneumoperitoneum, though note, the left lower thorax excluded from view. No definite pneumatosis or portal venous gas.  Excreted contrast is seen within the urinary bladder.  Enteric tube side port projects over the distal esophagus.  No acute osseus abnormalities.  IMPRESSION: 1. Similar findings suggestive of high-grade small bowel obstruction. 2. Enteric tube side port overlies the distal esophagus. Advancement at least 8 cm is recommended.   Electronically Signed   By: Sandi Mariscal M.D.   On: 07/20/2014 11:44   Ct Chest W Contrast  07/04/2014   CLINICAL DATA:  Esophageal carcinoma with partial lung resection. Chemotherapy and radiation therapy previously.  EXAM: CT CHEST WITH CONTRAST  TECHNIQUE: Multidetector CT imaging of the chest was performed during intravenous contrast administration.  CONTRAST:  30mL OMNIPAQUE IOHEXOL 300 MG/ML  SOLN  COMPARISON:  PET-CT 01/11/2014  FINDINGS: No axillary or supraclavicular lymphadenopathy. And gastric pull-up anatomy. Right infrahilar 15 mm lymph node at site of prior 23 mm lymph node (image 27, series 2). This has enhancing rim is concerning for active neoplasm. The previously described right lower lobe cavitary nodule has been surgically removed.  There is a moderate right pleural effusion. There is a focus of consolidation/ atelectasis in the medial right lower lobe (image 43, series 2). No nodules in the left lung.  Along the descending thoracic aorta there is a 6 mm nodule (image 37, series 2) which compares to 6 mm which is mildly hypermetabolic on comparison FDG PET scan.  No enhancing liver lesion.  Adrenal glands are normal. No aggressive osseous lesion.  IMPRESSION: 1. Interval right lower lobe resection of metastatic nodule. 2. Residual right pleural effusion and basilar consolidation. 3. Residual right infrahilar lymph node is concerning for metastatic adenopathy. 4. Small nodule along the descending thoracic aorta is mildly hypermetabolic on comparison PET-CT scan. Recommend continued attention on follow-up.   Electronically Signed   By: Suzy Bouchard M.D.   On: 07/04/2014 12:06   Ct Abdomen Pelvis W Contrast  07/19/2014   CLINICAL DATA:  ABDOMINAL PAIN  EXAM: CT ABDOMEN AND PELVIS WITH CONTRAST  TECHNIQUE: Multidetector CT imaging of the abdomen and pelvis was performed using the standard protocol following bolus administration of intravenous contrast.  CONTRAST:  17mL OMNIPAQUE IOHEXOL 300 MG/ML  SOLN  COMPARISON:  07/04/2014 and earlier studies  FINDINGS: Persistent moderate right pleural effusion. Dependent atelectasis posteriorly in the visualized right lower lobe. Changes of gastric pull-through with distention of the visualized stomach in the lower chest. Multiple dilated proximal small bowel loops, with transition to more normal caliber distal small bowel in the right mid abdomen. No discrete etiology. The colon is nondistended. No ascites. No free air. No adenopathy identified. Portal vein patent. Unremarkable liver, gallbladder, spleen, adrenal glands, kidneys, pancreas. Urinary bladder incompletely distended. Moderate prostatic enlargement. Regional bones unremarkable.  IMPRESSION: 1. Mid small bowel obstruction without discrete mass or evident etiology, suggesting adhesions. 2. Stable moderate right pleural effusion.   Electronically Signed   By: Arne Cleveland M.D.   On: 07/19/2014 23:07   US Renal  07/22/2014   CLINICAL DATA:  Nausea and abdominal pain. Small-bowel obstruction. Elevated creatinine. History of esophageal carcinoma.  EXAM: RENAL/URINARY TRACT ULTRASOUND COMPLETE   COMPARISON:  CT abdomen pelvis - 07/19/2014  FINDINGS: Right Kidney:  Normal cortical thickness, echogenicity and size, measuring 10.9 cm in length. No focal renal lesions. No echogenic renal stones. No urinary obstruction.  Left Kidney:  Normal cortical thickness, echogenicity and size, measuring 10.0 cm in length. No focal renal lesions. No echogenic renal stones. No urinary obstruction.  Bladder:  Decompressed with a Foley catheter.  Incidental note is made of a at least small size right-sided pleural effusion (image 3).  IMPRESSION: 1. No explanation for patient's elevated creatinine. Specifically, no evidence of urinary obstruction. 2. At least small sized right-sided pleural effusion incidentally noted, as seen on recently performed abdominal CT.   Electronically Signed   By: Sandi Mariscal M.D.   On: 07/22/2014 21:41   Dg Abd 2 Views  07/22/2014   CLINICAL DATA:  Followup small bowel obstruction.  EXAM: ABDOMEN - 2 VIEW  COMPARISON:  07/21/2014  FINDINGS: NG tube tip is in the mid stomach. Decreasing small bowel distention PA continued mild small bowel prominence in the left upper quadrant. No organomegaly or free air. No suspicious calcification.  IMPRESSION: Improving small bowel obstruction pattern with NG tube in place.   Electronically Signed   By: Rolm Baptise M.D.   On: 07/22/2014 08:00   Dg Abd 2 Views  07/21/2014   CLINICAL DATA:  Small bowel obstruction, followup  EXAM: ABDOMEN - 2 VIEW  COMPARISON:  Abdomen film of 07/20/2014 and CT abdomen pelvis of 07/19/2014  FINDINGS: There is persistent small bowel dilatation measuring up to 4.9 cm consistent with persistent small bowel obstruction. No free air is seen on the left lateral decubitus of the abdomen. No bowel gas is seen within the colon. Scattered air-fluid levels are noted on the decubitus film.  IMPRESSION: Persistent high-grade small bowel obstruction.  No free air.   Electronically Signed   By: Ivar Drape M.D.   On: 07/21/2014 08:15     Microbiology: Recent Results (from the past 240 hour(s))  URINE CULTURE     Status: None   Collection Time    07/23/14 12:31 PM      Result Value Ref Range Status   Specimen Description URINE, CATHETERIZED   Final   Special Requests Immunocompromised   Final   Culture  Setup Time     Final   Value: 07/23/2014 19:56     Performed at [place]     Final   Value: NO GROWTH  Performed at [facility]   Culture     Final   Value: NO GROWTH     Performed at [facility]   Report Status 07/24/2014 FINAL   Final     Labs: Basic Metabolic Panel:  Recent Labs Lab 07/22/14 1000 07/22/14 1614 07/23/14 0436 07/24/14 0516 07/25/14 0431 07/26/14 0430  NA 142 143 143 142 141 140  K 6.5* 5.8*  5.5* 4.7 3.5* 4.0 4.2  CL 102 101 102 94* 97 100  CO2 17* 17* 22 36* 33* 29  GLUCOSE 87 111* 132* 151* 124* 115*  BUN 90* 97* 106* 90* 76* 60*  CREATININE 6.24* 6.52* 6.42* 5.38* 4.73* 3.96*  CALCIUM 9.1 9.2 8.6 8.0* 8.1* 8.1*  PHOS  --  8.4* 6.7* 4.6 3.8  --    Liver Function Tests:  Recent Labs Lab 07/19/14 2043 07/21/14 0500 07/22/14 1614 07/23/14 0436 07/24/14 0516 07/25/14 0431  AST 49* 21  --  15  --  21  ALT 18 13  --  12  --  15  ALKPHOS 124* 55  --  52  --  54  BILITOT 0.6 0.8  --  0.5  --  0.7  PROT 9.3* 8.2  --  6.6  --  5.9*  ALBUMIN 4.8 3.6 3.0* 2.7* 2.6* 2.6*    Recent Labs Lab 07/19/14 2043  LIPASE 15   No results found for this basename: AMMONIA,  in the last 168 hours CBC:  Recent Labs Lab 07/19/14 2043 07/21/14 0500 07/22/14 1000 07/23/14 0436 07/24/14 0516 07/25/14 0431  WBC 16.0* 2.8* 6.3 8.8 7.2 6.1  NEUTROABS 12.4*  --   --   --   --   --   HGB 15.7 15.9 15.7 13.9 13.0 11.7*  HCT 44.9 47.2 44.5 41.0 38.6* 35.7*  MCV 81.8 83.5 81.4 82.8 82.7 83.6  PLT 284 228 209 223 173 157   Cardiac Enzymes:  Recent Labs Lab 07/22/14 1613  CKTOTAL 153   BNP: BNP (last 3 results) No results found  for this basename: PROBNP,  in the last 8760 hours CBG:  Recent Labs Lab 07/22/14 0811 07/23/14 0734 07/24/14 0719 07/25/14 0747 07/26/14 0734  GLUCAP 82 139* 139* 166* 134*    Time coordinating discharge: Over 30 minutes

## 2014-07-26 NOTE — Discharge Instructions (Signed)
Small Bowel Obstruction °A small bowel obstruction is a blockage (obstruction) of the small intestine (small bowel). The small bowel is a long, slender tube that connects the stomach to the colon. Its job is to absorb nutrients from the fluids and foods you consume into the bloodstream.  °CAUSES  °There are many causes of intestinal blockage. The most common ones include: °· Hernias. This is a more common cause in children than adults. °· Inflammatory bowel disease (enteritis and colitis). °· Twisting of the bowel (volvulus). °· Tumors. °· Scar tissue (adhesions) from previous surgery or radiation treatment. °· Recent surgery. This may cause an acute small bowel obstruction called an ileus. °SYMPTOMS  °· Abdominal pain. This may be dull cramps or sharp pain. It may occur in one area or may be present in the entire abdomen. Pain can range from mild to severe, depending on the degree of obstruction. °· Nausea and vomiting. Vomit may be greenish or yellow bile color. °· Distended or swollen stomach. Abdominal bloating is a common symptom. °· Constipation. °· Lack of passing gas. °· Frequent belching. °· Diarrhea. This may occur if runny stool is able to leak around the obstruction. °DIAGNOSIS  °Your caregiver can usually diagnose small bowel obstruction by taking a history, doing a physical exam, and taking X-rays. If the cause is unclear, a CT scan (computerized tomography) of your abdomen and pelvis may be needed. °TREATMENT  °Treatment of the blockage depends on the cause and how bad the problem is.  °· Sometimes, the obstruction improves with bed rest and intravenous (IV) fluids. °· Resting the bowel is very important. This means following a simple diet. Sometimes, a clear liquid diet may be required for several days. °· Sometimes, a small tube (nasogastric tube) is placed into the stomach to decompress the bowel. When the bowel is blocked, it usually swells up like a balloon filled with air and fluids.  Decompression means that the air and fluids are removed by suction through that tube. This can help with pain, discomfort, and nausea. It can also help the obstruction resolve faster. °· Surgery may be required if other treatments do not work. Bowel obstruction from a hernia may require early surgery and can be an emergency procedure. Adhesions that cause frequent or severe obstructions may also require surgery. °HOME CARE INSTRUCTIONS °If your bowel obstruction is only partial or incomplete, you may be allowed to go home. °· Get plenty of rest. °· Follow your diet as directed by your caregiver. °· Only consume clear liquids until your condition improves. °· Avoid solid foods as instructed. °SEEK IMMEDIATE MEDICAL CARE IF: °· You have increased pain or cramping. °· You vomit blood. °· You have uncontrolled vomiting or nausea. °· You cannot drink fluids due to vomiting or pain. °· You develop confusion. °· You begin feeling very dry or thirsty (dehydrated). °· You have severe bloating. °· You have chills. °· You have a fever. °· You feel extremely weak or you faint. °MAKE SURE YOU: °· Understand these instructions. °· Will watch your condition. °· Will get help right away if you are not doing well or get worse. °Document Released: 01/21/2006 Document Revised: 01/27/2012 Document Reviewed: 01/18/2011 °Exit[employer]® Patient Information ©2015 [employer]. This information is not intended to replace advice given to you by your health care provider. Make sure you discuss any questions you have with your health care provider. ° °

## 2014-07-26 NOTE — Progress Notes (Signed)
Patient ID: Daeshawn Redmann, male   DOB: Apr 01, 1961, 53 y.o.   MRN: [id number]    Subjective: No C/O, tol FL diet without difficulty.  + BM  Objective: Vital signs in last 24 hours: Temp:  [98.1 F (36.7 C)-99.2 F (37.3 C)] 98.1 F (36.7 C) (09/08 0501) Pulse Rate:  [69-81] 69 (09/08 0501) Resp:  [18-20] 20 (09/08 0501) BP: (126-137)/(62-72) 126/72 mmHg (09/08 0501) SpO2:  [97 %-98 %] 97 % (09/08 0501) Weight:  [121 lb 11.1 oz (55.2 kg)] 121 lb 11.1 oz (55.2 kg) (09/08 0501) Last BM Date: 07/25/14  Intake/Output from previous day: 09/07 0701 - 09/08 0700 In: 1967.1 [P.O.:840; I.V.:1127.1] Out: 2575 [Urine:2575] Intake/Output this shift:    General appearance: alert, cooperative and no distress GI: normal findings: soft, non-tender and non distended  Lab Results:   Recent Labs  07/24/14 0516 07/25/14 0431  WBC 7.2 6.1  HGB 13.0 11.7*  HCT 38.6* 35.7*  PLT 173 157   BMET  Recent Labs  07/25/14 0431 07/26/14 0430  NA 141 140  K 4.0 4.2  CL 97 100  CO2 33* 29  GLUCOSE 124* 115*  BUN 76* 60*  CREATININE 4.73* 3.96*  CALCIUM 8.1* 8.1*     Studies/Results: No results found.  Anti-infectives: Anti-infectives   Start     Dose/Rate Route Frequency Ordered Stop   07/20/14 0230  piperacillin-tazobactam (ZOSYN) IVPB 3.375 g  Status:  Discontinued     3.375 g 12.5 mL/hr over 240 Minutes Intravenous Every 8 hours 07/20/14 0209 07/20/14 1822      Assessment/Plan: SBO likely due to adhesions, appears resolved Renal fxn improving Anticipating discharge Will sign off, please call if needed    LOS: 7 days    Andrue Dini T 07/26/2014

## 2014-07-29 ENCOUNTER — Other Ambulatory Visit (HOSPITAL_BASED_OUTPATIENT_CLINIC_OR_DEPARTMENT_OTHER): Payer: BC Managed Care – PPO

## 2014-07-29 ENCOUNTER — Encounter: Payer: Self-pay | Admitting: Nutrition

## 2014-07-29 ENCOUNTER — Ambulatory Visit (HOSPITAL_BASED_OUTPATIENT_CLINIC_OR_DEPARTMENT_OTHER): Payer: BC Managed Care – PPO | Admitting: Nurse Practitioner

## 2014-07-29 VITALS — BP 125/75 | HR 72 | Temp 98.4°F | Resp 18 | Ht 63.0 in | Wt 118.3 lb

## 2014-07-29 DIAGNOSIS — C155 Malignant neoplasm of lower third of esophagus: Secondary | ICD-10-CM

## 2014-07-29 DIAGNOSIS — R3589 Other polyuria: Secondary | ICD-10-CM

## 2014-07-29 DIAGNOSIS — R358 Other polyuria: Secondary | ICD-10-CM

## 2014-07-29 DIAGNOSIS — R631 Polydipsia: Secondary | ICD-10-CM

## 2014-07-29 DIAGNOSIS — J90 Pleural effusion, not elsewhere classified: Secondary | ICD-10-CM

## 2014-07-29 DIAGNOSIS — C7800 Secondary malignant neoplasm of unspecified lung: Secondary | ICD-10-CM

## 2014-07-29 LAB — COMPREHENSIVE METABOLIC PANEL (CC13)
ALBUMIN: 3.1 g/dL — AB (ref 3.5–5.0)
ALT: 30 U/L (ref 0–55)
AST: 26 U/L (ref 5–34)
Alkaline Phosphatase: 74 U/L (ref 40–150)
Anion Gap: 9 mEq/L (ref 3–11)
BUN: 25.5 mg/dL (ref 7.0–26.0)
CHLORIDE: 108 meq/L (ref 98–109)
CO2: 24 mEq/L (ref 22–29)
Calcium: 9 mg/dL (ref 8.4–10.4)
Creatinine: 2.3 mg/dL — ABNORMAL HIGH (ref 0.7–1.3)
Glucose: 90 mg/dl (ref 70–140)
POTASSIUM: 4.5 meq/L (ref 3.5–5.1)
Sodium: 142 mEq/L (ref 136–145)
TOTAL PROTEIN: 7.5 g/dL (ref 6.4–8.3)
Total Bilirubin: 0.62 mg/dL (ref 0.20–1.20)

## 2014-07-29 LAB — URINALYSIS, MICROSCOPIC - CHCC
Bilirubin (Urine): NEGATIVE
Blood: NEGATIVE
Glucose: NEGATIVE mg/dL
Ketones: NEGATIVE mg/dL
Leukocyte Esterase: NEGATIVE
NITRITE: NEGATIVE
Protein: NEGATIVE mg/dL
RBC / HPF: NEGATIVE (ref 0–2)
SPECIFIC GRAVITY, URINE: 1.005 (ref 1.003–1.035)
UROBILINOGEN UR: 0.2 mg/dL (ref 0.2–1)
WBC, UA: NEGATIVE (ref 0–2)
pH: 7.5 (ref 4.6–8.0)

## 2014-07-29 LAB — CBC WITH DIFFERENTIAL/PLATELET
BASO%: 0.5 % (ref 0.0–2.0)
Basophils Absolute: 0 10*3/uL (ref 0.0–0.1)
EOS ABS: 0.2 10*3/uL (ref 0.0–0.5)
EOS%: 2.2 % (ref 0.0–7.0)
HCT: 39.9 % (ref 38.4–49.9)
HGB: 12.8 g/dL — ABNORMAL LOW (ref 13.0–17.1)
LYMPH#: 1.8 10*3/uL (ref 0.9–3.3)
LYMPH%: 20.3 % (ref 14.0–49.0)
MCH: 27.5 pg (ref 27.2–33.4)
MCHC: 32.1 g/dL (ref 32.0–36.0)
MCV: 85.5 fL (ref 79.3–98.0)
MONO#: 1 10*3/uL — ABNORMAL HIGH (ref 0.1–0.9)
MONO%: 10.7 % (ref 0.0–14.0)
NEUT#: 5.9 10*3/uL (ref 1.5–6.5)
NEUT%: 66.3 % (ref 39.0–75.0)
Platelets: 321 10*3/uL (ref 140–400)
RBC: 4.67 10*6/uL (ref 4.20–5.82)
RDW: 15.2 % — ABNORMAL HIGH (ref 11.0–14.6)
WBC: 8.9 10*3/uL (ref 4.0–10.3)

## 2014-07-29 NOTE — Progress Notes (Signed)
Daughter requesting Juiced-based oral nutrition supplement samples.  These were provided with ordering information.

## 2014-07-29 NOTE — Progress Notes (Addendum)
[facility] OFFICE PROGRESS NOTE   Diagnosis:  Esophagus cancer.  INTERVAL HISTORY:   Bruce [name] returns prior to scheduled followup. He was hospitalized on 07/19/2014 with abdominal pain and vomiting. CT scan showed a mid small bowel obstruction without discreet mass or evident etiology, suggesting adhesions. There was a stable moderate right pleural effusion. He developed renal failure in the hospital felt to likely be contrast-induced nephropathy. He was seen by renal. Creatinine increased to a high of 6.52 on 07/22/2014 and had improved to 3.96 by time of discharge on 07/26/2014.  He denies abdominal pain. Bowels are moving. No nausea or vomiting. Appetite is poor. He feels "dehydrated". He reports frequent urination estimating 13-14 times a day. He is thirsty and is drinking 5 or 6 bottled waters a day in addition to other fluids. He is having multiple sleeping. He notes that the skin on his hands is peeling. No confusion. No falls or balance problems.  Objective:  Vital signs in last 24 hours:  Blood pressure 125/75, pulse 72, temperature 98.4 F (36.9 C), temperature source Oral, resp. rate 18, height 5\' 3"  (1.6 m), weight 118 lb 4.8 oz (53.661 kg), SpO2 100.00%.    HEENT: No thrush or ulcers. Mouth appears mildly dry. Lymphatics: No palpable cervical, supraclavicular or axillary lymph nodes. Resp: Inspiratory rales at the left lung base. Breath sounds managed at the right lung base. No respiratory distress. Cardio: Regular rate and rhythm. GI: Abdomen soft, nontender. No hepatomegaly. No mass. Bowel sounds present. Vascular: No leg edema. Neuro: Alert and oriented. Skin: Skin appears dry. Marked dryness over the fingertips with peeling at the left index finger. Skin turgor is decreased.   Lab Results:  Lab Results  Component Value Date   WBC 6.1 07/25/2014   HGB 11.7* 07/25/2014   HCT 35.7* 07/25/2014   MCV 83.6 07/25/2014   PLT 157 07/25/2014   NEUTROABS 12.4*  07/19/2014    Imaging:  No results found.  Medications: I have reviewed the patient's current medications.  Assessment/Plan: 1. 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 Staging CT scans of the chest, abdomen and pelvis on 06/02/2012 negative for evidence of metastatic disease. Endoscopic ultrasound 06/11/2012 confirmed a uT3 uN1 tumor at 40 cm from the incisors. He began radiation 06/15/2012. He began weekly Taxol/carboplatin chemotherapy 06/16/2012. He completed 6 weekly chemotherapy treatments. The carboplatin was held with week 5 due to thrombocytopenia. He completed the sixth weekly treatment with Taxol and carboplatin on 07/23/2012. He completed radiation on 07/29/2012 -he underwent an esophagectomy on 08/26/2012 with the pathology confirming a ypT3,ypN0 squamous cell carcinoma with perineural invasion identified.  2. History of solid/liquid dysphagia secondary to the obstructing esophagus mass.  3. Reflux. He is taking Protonix. 4. Hospitalization 07/19/2014 with abdominal pain and vomiting. CT scan showed a mid small bowel obstruction without discrete mass or evident etiology. Small bowel obstruction felt to be secondary to adhesions. 5. Renal failure during the 07/19/2014 hospitalization felt to be contrast-induced nephropathy. 6. Chest CT 06/15/2013 with postoperative changes from esophagectomy and gastric pull-through. 7 mm posterior subpleural nodule in the right lower lobe. Followup CT scan 12/16/2013 revealed an increase in the size of a right hilar node and right upper lobe nodule. 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   Disposition: Bruce [name] was recently hospitalized with a small bowel obstruction felt to be due to adhesions. He developed renal failure during the hospitalization felt to be contrast-induced nephropathy.  He is experiencing polyuria and polydipsia of unclear etiology. We are obtaining a stat chemistry panel, urinalysis and urine sodium. He was instructed to push fluids. We scheduled a return visit on 08/01/2014 with repeat labs.  Patient seen with Dr. Benay Spice. 30 minutes were spent face-to-face at today's visit that the majority of the time involved in counseling/coordination of care.  A translator was present throughout today's visit.  Ned Card ANP/GNP-BC   07/29/2014  3:16 PM  This was a shared visit with Ned Card.  Bruce [name] was interviewed and examined. He has developed polyuria and polydipsia after an admission with a bowel obstruction and renal failure. We will check a chemistry panel and urinalysis today. 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 We recommended he push fluids and contact us for recurrent nausea.  Julieanne Manson, M.D.

## 2014-07-29 NOTE — Progress Notes (Signed)
Called and spoke with Ernestene Kiel, Nutritionist. Pt wants to know about starting a supplement.

## 2014-07-30 LAB — SODIUM, URINE, RANDOM: Sodium, Ur: 71 mEq/L

## 2014-08-01 ENCOUNTER — Ambulatory Visit (HOSPITAL_BASED_OUTPATIENT_CLINIC_OR_DEPARTMENT_OTHER): Payer: BC Managed Care – PPO | Admitting: Nurse Practitioner

## 2014-08-01 ENCOUNTER — Other Ambulatory Visit (HOSPITAL_BASED_OUTPATIENT_CLINIC_OR_DEPARTMENT_OTHER): Payer: BC Managed Care – PPO

## 2014-08-01 ENCOUNTER — Telehealth: Payer: Self-pay | Admitting: Nurse Practitioner

## 2014-08-01 VITALS — BP 116/77 | HR 86 | Temp 97.9°F | Resp 18 | Ht 63.0 in | Wt 116.6 lb

## 2014-08-01 DIAGNOSIS — K219 Gastro-esophageal reflux disease without esophagitis: Secondary | ICD-10-CM

## 2014-08-01 DIAGNOSIS — C7800 Secondary malignant neoplasm of unspecified lung: Secondary | ICD-10-CM

## 2014-08-01 DIAGNOSIS — C155 Malignant neoplasm of lower third of esophagus: Secondary | ICD-10-CM

## 2014-08-01 LAB — COMPREHENSIVE METABOLIC PANEL (CC13)
ALT: 26 U/L (ref 0–55)
AST: 18 U/L (ref 5–34)
Albumin: 3.1 g/dL — ABNORMAL LOW (ref 3.5–5.0)
Alkaline Phosphatase: 88 U/L (ref 40–150)
Anion Gap: 9 mEq/L (ref 3–11)
BUN: 19.7 mg/dL (ref 7.0–26.0)
CO2: 24 meq/L (ref 22–29)
Calcium: 8.9 mg/dL (ref 8.4–10.4)
Chloride: 107 mEq/L (ref 98–109)
Creatinine: 1.8 mg/dL — ABNORMAL HIGH (ref 0.7–1.3)
GLUCOSE: 106 mg/dL (ref 70–140)
Potassium: 4.6 mEq/L (ref 3.5–5.1)
SODIUM: 140 meq/L (ref 136–145)
TOTAL PROTEIN: 7.6 g/dL (ref 6.4–8.3)
Total Bilirubin: 0.43 mg/dL (ref 0.20–1.20)

## 2014-08-01 NOTE — Progress Notes (Addendum)
**Bruce Bruce** Bruce Bruce   Diagnosis:  Esophagus cancer.  INTERVAL HISTORY:   Bruce Bruce returns as scheduled. He is feeling better. He is less thirsty and is now urinating about 5 or 6 times a day. Aside from a headache earlier this morning he denies pain. He had mild nausea earlier today which is now better. The skin on his hands and feet has healed.  Objective:  Vital signs in last 24 hours:  Blood pressure 116/77, pulse 86, temperature 97.9 F (36.6 C), temperature source Oral, resp. rate 18, height 5\' 3"  (1.6 m), weight 116 lb 9.6 oz (52.889 kg).    HEENT: No thrush or ulcers. Resp: Rales left lung base. Breath sounds diminished at the right lung base. No respiratory distress. Cardio: Regular rate and rhythm. GI: Abdomen soft and nontender. No hepatomegaly. Vascular: No leg edema.  Skin: Skin over the palms and soles has peeled. Skin turgor is mildly decreased.   Lab Results:  Lab Results  Component Value Date   WBC 8.9 07/29/2014   HGB 12.8* 07/29/2014   HCT 39.9 07/29/2014   MCV 85.5 07/29/2014   PLT 321 07/29/2014   NEUTROABS 5.9 07/29/2014   creatinine 1.8  Imaging:  No results found.  Medications: I have reviewed the patient's current medications.  Assessment/Plan: 1. 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 Staging CT scans of the chest, abdomen and pelvis on 06/02/2012 negative for evidence of metastatic disease. Endoscopic ultrasound 06/11/2012 confirmed a uT3 uN1 tumor at 40 cm from the incisors. He began radiation 06/15/2012. He began weekly Taxol/carboplatin chemotherapy 06/16/2012. He completed 6 weekly chemotherapy treatments. The carboplatin was held with week 5 due to thrombocytopenia. He completed the sixth weekly treatment with Taxol and carboplatin on 07/23/2012. He completed radiation on 07/29/2012 -he underwent an esophagectomy on 08/26/2012  with the pathology confirming a ypT3,ypN0 squamous cell carcinoma with perineural invasion identified.  2. History of solid/liquid dysphagia secondary to the obstructing esophagus mass.  3. Reflux. He is taking Protonix. 4. Hospitalization 07/19/2014 with abdominal pain and vomiting. CT scan showed a mid small bowel obstruction without discrete mass or evident etiology. Small bowel obstruction felt to be secondary to adhesions. 5. Renal failure during the 07/19/2014 hospitalization felt to be contrast-induced nephropathy. 6. Chest CT 06/15/2013 with postoperative changes from esophagectomy and gastric pull-through. 7 mm posterior subpleural nodule in the right lower lobe. Followup CT scan 12/16/2013 revealed an increase in the size of a right hilar node and right upper lobe nodule. 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   Disposition: Bruce Bruce appears improved. The polyuria and polydipsia are better. Renal function continues to improve.  We scheduled a return visit in approximately 4 weeks with a chemistry panel and chest x-ray. He will contact the office in the interim with any problems. We specifically discussed recurrent polyuria and/or polydipsia.  Patient  seen with Dr. Benay Spice.    Ned Card ANP/GNP-BC   08/01/2014  3:40 PM  This was a  shared visit with Ned Card. The renal function is better in the polyuria/polydipsia have improved. He will contact us for new symptoms. Bruce Bruce will return for an office visit and chest x-ray in 4 weeks. He has developed skin desquamation at the fingers and toes, potentially related to recent dehydration, renal failure, or a medication effect.  Julieanne Manson, M.D.

## 2014-08-01 NOTE — Telephone Encounter (Signed)
Pt confirmed labs/ov per 09/14 POF, gave pt walk in paper to [facility] for x-ray same day as MD but at 11:30......gave pt AVS....KJ

## 2014-08-25 ENCOUNTER — Ambulatory Visit (HOSPITAL_COMMUNITY)
Admission: RE | Admit: 2014-08-25 | Discharge: 2014-08-25 | Disposition: A | Discharge status: Home / Self Care | Payer: BC Managed Care – PPO | Source: Ambulatory Visit | Attending: Oncology | Admitting: Oncology

## 2014-08-25 ENCOUNTER — Other Ambulatory Visit (HOSPITAL_BASED_OUTPATIENT_CLINIC_OR_DEPARTMENT_OTHER): Payer: BC Managed Care – PPO

## 2014-08-25 ENCOUNTER — Telehealth: Payer: Self-pay | Admitting: Oncology

## 2014-08-25 ENCOUNTER — Ambulatory Visit (HOSPITAL_BASED_OUTPATIENT_CLINIC_OR_DEPARTMENT_OTHER): Payer: BC Managed Care – PPO | Admitting: Oncology

## 2014-08-25 VITALS — BP 111/70 | HR 65 | Temp 98.4°F | Resp 18 | Ht 63.0 in | Wt 121.2 lb

## 2014-08-25 DIAGNOSIS — J90 Pleural effusion, not elsewhere classified: Secondary | ICD-10-CM | POA: Insufficient documentation

## 2014-08-25 DIAGNOSIS — C155 Malignant neoplasm of lower third of esophagus: Secondary | ICD-10-CM

## 2014-08-25 DIAGNOSIS — C7800 Secondary malignant neoplasm of unspecified lung: Secondary | ICD-10-CM

## 2014-08-25 LAB — COMPREHENSIVE METABOLIC PANEL (CC13)
ALK PHOS: 121 U/L (ref 40–150)
ALT: 22 U/L (ref 0–55)
AST: 19 U/L (ref 5–34)
Albumin: 3.4 g/dL — ABNORMAL LOW (ref 3.5–5.0)
Anion Gap: 5 mEq/L (ref 3–11)
BILIRUBIN TOTAL: 0.45 mg/dL (ref 0.20–1.20)
BUN: 10.4 mg/dL (ref 7.0–26.0)
CO2: 28 meq/L (ref 22–29)
Calcium: 9.3 mg/dL (ref 8.4–10.4)
Chloride: 108 mEq/L (ref 98–109)
Creatinine: 1.1 mg/dL (ref 0.7–1.3)
Glucose: 95 mg/dl (ref 70–140)
Potassium: 4.2 mEq/L (ref 3.5–5.1)
SODIUM: 141 meq/L (ref 136–145)
TOTAL PROTEIN: 7.1 g/dL (ref 6.4–8.3)

## 2014-08-25 NOTE — Progress Notes (Signed)
  [facility] OFFICE PROGRESS NOTE   Diagnosis: Esophagus cancer  INTERVAL HISTORY:   He returns as scheduled. He no longer has polyuria and polydipsia. Mild dyspnea. He has reflux symptoms in the evening. No pain.  Objective:  Vital signs in last 24 hours:  Blood pressure 111/70, pulse 65, temperature 98.4 F (36.9 C), temperature source Oral, resp. rate 18, height 5\' 3"  (1.6 m), weight 121 lb 3.2 oz (54.976 kg), SpO2 100.00%.    HEENT: Neck without mass Lymphatics: No cervical or supraclavicular nodes Resp: End inspiratory fine rales at the lower posterior chest bilaterally, no respiratory distress. Good air movement bilaterally Cardio: Regular rate and rhythm GI: No hepatomegaly, nontender Vascular: No leg edema   Lab Results: Potassium 4.2, creatinine 1.1  Medications: I have reviewed the patient's current medications.  Assessment/Plan: 1. 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 Staging CT scans of the chest, abdomen and pelvis on 06/02/2012 negative for evidence of metastatic disease. Endoscopic ultrasound 06/11/2012 confirmed a uT3 uN1 tumor at 40 cm from the incisors. He began radiation 06/15/2012. He began weekly Taxol/carboplatin chemotherapy 06/16/2012. He completed 6 weekly chemotherapy treatments. The carboplatin was held with week 5 due to thrombocytopenia. He completed the sixth weekly treatment with Taxol and carboplatin on 07/23/2012. He completed radiation on 07/29/2012 -he underwent an esophagectomy on 08/26/2012 with the pathology confirming a ypT3,ypN0 squamous cell carcinoma with perineural invasion identified.  2. History of solid/liquid dysphagia secondary to the obstructing esophagus mass.  3. Reflux. He is taking Protonix. 4. Hospitalization 07/19/2014 with abdominal pain and vomiting. CT scan showed a mid small bowel obstruction without discrete mass or  evident etiology. Small bowel obstruction felt to be secondary to adhesions. 5. Renal failure during the 07/19/2014 hospitalization felt to be contrast-induced nephropathy. 6. Chest CT 06/15/2013 with postoperative changes from esophagectomy and gastric pull-through. 7 mm posterior subpleural nodule in the right lower lobe. Followup CT scan 12/16/2013 revealed an increase in the size of a right hilar node and right upper lobe nodule. 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  Disposition: He has recovered from the admission with renal failure. We will obtain a chest x-ray today to followup on the right pleural effusion. Mr. Gubbels is planning a trip to [place] for late November. He will return for an office visit and chest x-ray 10/04/2014. He understands at some point  the esophagus cancer will likely progress and require treatment.  Betsy Coder, MD  08/25/2014  12:52 PM

## 2014-08-25 NOTE — Telephone Encounter (Signed)
gv and printed appt sched adn avs for pt for NOV °

## 2014-08-26 ENCOUNTER — Telehealth: Payer: Self-pay | Admitting: *Deleted

## 2014-08-26 NOTE — Telephone Encounter (Signed)
Message copied by Brien Few on Fri Aug 26, 2014  2:00 PM ------      Message from: Betsy Coder B      Created: Thu Aug 25, 2014  6:54 PM       Please call daughter, cxr is unchanged, small rt. Effusion, f/u as scheduled ------

## 2014-08-29 ENCOUNTER — Ambulatory Visit: Payer: BC Managed Care – PPO | Admitting: Nurse Practitioner

## 2014-08-31 ENCOUNTER — Telehealth: Payer: Self-pay | Admitting: *Deleted

## 2014-08-31 NOTE — Telephone Encounter (Signed)
Notified daughter that CXR is stable-no change in small right effusion.

## 2014-08-31 NOTE — Telephone Encounter (Signed)
Message copied by Tania Ade on Wed Aug 31, 2014  1:01 PM ------      Message from: Betsy Coder B      Created: Thu Aug 25, 2014  6:54 PM       Please call daughter, cxr is unchanged, small rt. Effusion, f/u as scheduled ------

## 2014-09-01 ENCOUNTER — Ambulatory Visit: Payer: BC Managed Care – PPO | Admitting: Cardiothoracic Surgery

## 2014-09-08 ENCOUNTER — Ambulatory Visit (INDEPENDENT_AMBULATORY_CARE_PROVIDER_SITE_OTHER): Payer: BC Managed Care – PPO | Admitting: Cardiothoracic Surgery

## 2014-09-08 ENCOUNTER — Encounter: Payer: Self-pay | Admitting: Cardiothoracic Surgery

## 2014-09-08 VITALS — BP 114/74 | HR 106 | Ht 63.0 in | Wt 121.0 lb

## 2014-09-08 DIAGNOSIS — C159 Malignant neoplasm of esophagus, unspecified: Secondary | ICD-10-CM

## 2014-09-08 NOTE — Progress Notes (Signed)
[place] Record #[id number] Date of Birth: 15-Jul-1961  Referring: Ladell Pier, MD Primary Care: No PCP Per Patient  Chief Complaint:    Chief Complaint  Patient presents with  . F/U THORACIC    3 MO F/U VISIT    History of Present Illness:    Bruce [name] 53 y.o. male  underwent total esophagectomy with cervical esophagogastrostomy October 2013 after a course of preoperative radiation and chemotherapy for squamous cell carcinoma of the esophagus. On followup scans the patient was noted to have an increasing right lung lesion.  The patient underwent  wedge resection of right lung lesion and node dissection, final pathology is suggestive of squamous cell from known metastatic from esophageal primary 15months ago Hospitalization 07/19/2014 with abdominal pain and vomiting. CT scan showed a mid small bowel obstruction without discrete mass or evident etiology. Small bowel obstruction felt to be secondary to adhesions.   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   Zubrod Score: At the time of surgery this patient's most appropriate activity status/level should be described as: []     0    Normal activity, no symptoms [x]     1    Restricted in physical strenuous activity but ambulatory, able to do out light work []     2    Ambulatory and capable of self care, unable to do work  activities, up and about               >50 % of waking hours                              []     3    Only limited self care, in bed greater than 50% of waking hours []     4    Completely disabled, no self care, confined to bed or chair []     5    Moribund   Past Medical History  Diagnosis Date  . Esophagitis   . Mass of esophagus 05/28/2012    BX'D DISTAL ESOPHAGUS,PENDING  . Dysphasia     solid and liquid  . Neuropathy     right hand numbness 1 year 2012  . 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 Lung cancer 12/27/13    right upper lung    Past Surgical History  Procedure Laterality Date  . 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 Eus  06/11/2012    Procedure: UPPER ENDOSCOPIC ULTRASOUND (EUS) RADIAL;  Surgeon: Milus Banister, MD;  Location: [facility];  Service: Endoscopy;  Laterality: N/A;  . Complete esophagectomy  08/26/2012    Procedure: ESOPHAGECTOMY COMPLETE;  Surgeon: [name], MD;  Location: [place];  Service:  Thoracic;  Laterality: N/A;  transhiatal   . Pyloroplasty  08/26/2012    Procedure: PYLOROPLASTY;  Surgeon: [name], MD;  Location: [place];  Service: Thoracic;  Laterality: N/A;  . Video bronchoscopy  08/26/2012    Procedure: VIDEO BRONCHOSCOPY;  Surgeon: [name], MD;  Location: [place];  Service: Thoracic;  Laterality: N/A;  . Jejunostomy  08/26/2012    Procedure: JEJUNOSTOMY;  Surgeon: [name], MD;  Location: [place];  Service: Thoracic;  Laterality: N/A;  placement of feeding jujunostomy tube  . Video bronchoscopy N/A 01/31/2014    Procedure: VIDEO BRONCHOSCOPY;  Surgeon: [name], MD;  Location[facility];  Service: Thoracic;  Laterality: N/A;  . Video assisted thoracoscopy (vats)/wedge resection Right 01/31/2014    Procedure: VIDEO ASSISTED THORACOSCOPY (VATS)/WEDGE RESECTION; with insertion of On Q pain pump;  Surgeon: [name], MD;  Location: [place];  Service: Thoracic;  Laterality: Right;  with insertion of On Q pain pump  . Lymph node dissection Right 01/31/2014    Procedure: LYMPH NODE DISSECTION;  Surgeon: [name], MD;  Location: [facility] OR;  Service: Thoracic;  Laterality: Right;    Family History  Problem Relation Age of Onset  . Breast cancer Sister     History   Social History  . Marital Status: Married    Spouse Name: N/A    Number of Children: N/A  . Years of Education: N/A   Occupational History  . welder    Social History Main Topics  . Smoking status: Former Smoker -- 0.40 packs/day for 25 years    Types: Cigarettes    Quit date: 06/19/2011  . Smokeless tobacco: Former Systems developer    Quit date: 06/03/2007  . Alcohol Use: No     Comment: not currently (01/28/14)  . Drug Use: No  . Sexual Activity: Not on file      History  Smoking status  . Former Smoker -- 0.40 packs/day for 25 years  . Types: Cigarettes  . Quit date: 06/19/2011  Smokeless tobacco  . Former Systems developer  . Quit date: 06/03/2007    History  Alcohol Use No    Comment: not currently (01/28/14)     No Known Allergies  Current Outpatient Prescriptions  Medication Sig Dispense Refill  . pantoprazole (PROTONIX) 40 MG tablet Take 40 mg by mouth daily as needed (heart burn).       No current facility-administered medications for this visit.     Review of Systems:     Cardiac Review of Systems: Y or N  Chest Pain [ n   ]  Resting SOB [ n  ] Exertional SOB  [n  ]  Orthopnea [ n ]   Pedal Edema [ n  ]    Palpitations [ n ] Syncope  [ n ]   Presyncope [  n ]  General Review of Systems: [Y] = yes [  ]=no Constitional: recent weight change [n  ];  Wt loss over the last 3 months [   ] anorexia [  ]; fatigue Blue.Reese  ]; nausea [ n ]; night sweats [ n ]; fever [ n ]; or chills [n  ];          Dental: poor dentition[n  ]; Last Dentist visit:   Eye : blurred vision [  ]; diplopia [   ]; vision changes [  ];  Amaurosis  fugax[  ]; Resp: cough [  ];  wheezing[n  ];  hemoptysis[ n ]; shortness of breath[ n ]; paroxysmal nocturnal dyspnea[ n ]; dyspnea on exertion[ n ]; or orthopnea[  ];  GI:  gallstones[  ], vomiting[ n ];  dysphagia[  ]; melena[ n ];  hematochezia [n  ]; heartburn[ y ];   Hx of  Colonoscopy[  ]; GU: kidney stones [  ]; hematuria[  ];   dysuria [n  ];  nocturia[  ];  history of     obstruction [ n ]; urinary frequency [ n ]             Skin: rash, swelling[  ];, hair loss[  ];  peripheral edema[  ];  or itching[  ]; Musculosketetal: myalgias[  ];  joint swelling[  ];  joint erythema[  ];  joint pain[  ];  back pain[  ];  Heme/Lymph: bruising[  ];  bleeding[n  ];  anemia[  ];  Neuro: TIA[  ];  headaches[  ];  stroke[ n ];  vertigo[  ];  seizures[  ];   paresthesias[  ];  difficulty walking[n  ];  Psych:depression[  ]; anxiety[  ];  Endocrine: diabetes[ n ];  thyroid dysfunction[n  ];  Immunizations: Flu up to date Blue.Reese  ]; Pneumococcal up to date [  y];  Other:  Physical Exam: BP 114/74  Pulse 106  Ht 5\' 3"  (1.6 m)  Wt 121 lb (54.885 kg)  BMI 21.44 kg/m2  SpO2 96%  PHYSICAL EXAMINATION:  General appearance: alert, cooperative, appears stated age and no distress Neurologic: intact Heart: regular rate and rhythm, S1, S2 normal, no murmur, click, rub or gallop Lungs: clear to auscultation bilaterally Abdomen: soft, non-tender; bowel sounds normal; no masses,  no organomegaly Extremities: extremities normal, atraumatic, no cyanosis or edema and Homans sign is negative, no sign of DVT Wound: Patient's incisions in the left neck and mid abdomen are well-healed, he has bilateral chest tube sites well healed from chest tubes placed at the time of his esophagectomy The right chest incision is well-healed without evidence of infection Patient has no cervical or supraclavicular or axillary adenopathy.  Diagnostic Studies & Laboratory data:     Recent Radiology Findings:  Dg Chest 2  View  08/25/2014   CLINICAL DATA:  Metastatic esophageal cancer. Evaluate for right pleural effusion  EXAM: CHEST  2 VIEW  COMPARISON:  Chest radiograph 07/19/2014 and CT chest 07/04/2014  FINDINGS: Stable heart and mediastinal contours. Small right pleural effusion is without significant change compared chest radiograph of 07/19/2014. Stable small volume of pleural fluid or pleural thickening over the right lung apex. Persistent linear opacities in the right midlung. Mild left basilar atelectasis. Slight chronic prominence of the interstitial markings of the lung bases. No acute osseous abnormality identified.  IMPRESSION: No significant change in small right pleural effusion.  Chronic mild bibasilar interstitial prominence and no significant change in linear opacities in the right midlung. Left basilar atelectasis.   Electronically Signed   By: Curlene Dolphin M.D.   On: 08/25/2014 14:39   Dg Chest 1 View  12/27/2013   CLINICAL DATA:  Post right upper lobe nodule biopsy, no chest pain or shortness of breath.  EXAM: CHEST - 1 VIEW  COMPARISON:  CT BIOPSY dated 12/27/2013; CT CHEST W/CM dated 12/16/2013; DG CHEST 2 VIEW dated 03/04/2013  FINDINGS: Grossly unchanged borderline enlarged cardiac silhouette and mediastinal contours. Known centrally cavitary nodule within the right upper lobe is not well demonstrated on the present  examination. No pleural effusion or pneumothorax following right upper lobe lung nodule biopsy. No evidence of edema. Unchanged bones.  IMPRESSION: No evidence of complication following right upper lung nodule biopsy.   Electronically Signed   By: Sandi Mariscal M.D.   On: 12/27/2013 12:01   Nm Pet Image Restag (ps) Skull Base To Thigh  01/11/2014   CLINICAL DATA:  Subsequent treatment strategy for esophageal cancer. Recent biopsy of a cavitary right upper lobe lung lesion revealed invasive squamous cell carcinoma.  EXAM: NUCLEAR MEDICINE PET SKULL BASE TO THIGH  FASTING BLOOD GLUCOSE:  Value: 92  mg/dl  TECHNIQUE: 6.3 mCi F-18 FDG was injected intravenously. Full-ring PET imaging was performed from the skull base to thigh after the radiotracer. CT data was obtained and used for attenuation correction and anatomic localization.  COMPARISON:  CT BIOPSY dated 12/27/2013  FINDINGS: NECK  No hypermetabolic lymph nodes in the neck.  CHEST  Gastric pull-through noted. Cavitary posterior right upper lobe mass, 2.1 x 1.5 cm, maximum standard uptake value 7.7. Posterior left infrahilar lymph node, 2.3 x 2.0 cm, maximum standard uptake value 9.8. Adjacent indistinct infrahilar node has maximum standard uptake value of 5.9.  A vascular structure or small lymph node posterior to the descending thoracic aorta has a maximum standard uptake value of 3.4 and is probably incidental.  ABDOMEN/PELVIS  Mid transverse colon has some faintly increased signal but is also small in caliber, unless this increased signal (standard uptake value 6.1) is probably physiologic. Mildly prominent but gas-filled appendix does not appear actively inflamed. It is  SKELETON  No focal hypermetabolic activity to suggest skeletal metastasis.  IMPRESSION: 1. Hypermetabolic cavitary right upper lobe mass corresponding to the known invasive squamous cell carcinoma. Pathologic right infrahilar adenopathy. 2. Faintly increased activity in the mid transverse colon is probably physiologic.   Electronically Signed   By: Sherryl Barters M.D.   On: 01/11/2014 11:46   Ct Biopsy  12/27/2013   INDICATION: History of esophageal cancer, now with enlarging centrally necrotic nodule within the right upper lobe. Please obtain biopsy to evaluate for either metastatic disease versus primary lung cancer.  EXAM: CT BIOPSY  MEDICATIONS: Fentanyl 75 mcg IV; Versed 1.5 mg IV  ANESTHESIA/SEDATION: Sedation time  20 minutes  CONTRAST:  None  COMPARISON:  NM PET IMAGE INITIAL (PI) SKULL BASE TO THIGH dated 06/11/2012; CT CHEST W/CM dated 12/16/2013; CT CHEST W/CM dated  06/15/2013  PROCEDURE: Informed consent was obtained from the patient (via the use of a medical translator) following an explanation of the procedure, risks, benefits and alternatives. The patient understands,agrees and consents for the procedure. All questions were addressed. A time out was performed prior to the initiation of the procedure.  The patient was positioned right lateral decubitus on the CT table and a limited chest CT was performed for procedural planning demonstrating no change to minimal increase in size of the now approximately 1.7 x 1.4 cm nodule within the right upper lobe (image 5, series 3). Note, there has been interval increased central cavitation of this nodule. The operative site was prepped and draped in the usual sterile fashion. Under sterile conditions and local anesthesia, a 22 gauge spinal needle was utilized for procedural planning. This was followed by the placement of a 17 gauge coaxial needle into the peripheral caudal aspect of the nodule. Positioning was confirmed with intermittent CT fluoroscopy and followed by the acquisition of 2 core needle biopsies with an 18 gauge core needle biopsy device.  Limited  post procedural chest CT was negative for pneumothorax or additional complication. The co-axial needle was removed and hemostasis was achieved with manual compression. A dressing was placed. The patient tolerated the procedure well without immediate postprocedural complication. The patient was escorted to have an upright chest radiograph.  COMPLICATIONS: None immediate.  IMPRESSION: Technically successful CT guided core needle core biopsy of enlarging centrally necrotic nodule within the right upper lobe.   Electronically Signed   By: Sandi Mariscal M.D.   On: 12/27/2013 13:19      Recent Lab Findings: Lab Results  Component Value Date   WBC 8.9 07/29/2014   HGB 12.8* 07/29/2014   HCT 39.9 07/29/2014   PLT 321 07/29/2014   GLUCOSE 95 08/25/2014   ALT 22 08/25/2014   AST 19  08/25/2014   NA 141 08/25/2014   K 4.2 08/25/2014   CL 100 07/26/2014   CREATININE 1.1 08/25/2014   BUN 10.4 08/25/2014   CO2 28 08/25/2014   TSH 0.853 07/20/2014   INR 1.13 07/21/2014   HGBA1C 6.2* 07/20/2014      Assessment / Plan:   Advanced stage esophageal cancer, currently under observation only. Patient is maintained his nutritional status adequately in spite of a recent admission for partial small bowel obstruction. Small right pleural effusion appear stable Patient has followup chest x-ray in 2 weeks, and then is planning a trip to [place]. I plan to see back in 3 months or at Dr. Benay Spice as requested anytime.   [name] MD      [address] [place] [address] Office [phone]   Beeper [phone]  09/08/2014 2:06 PM

## 2014-09-14 ENCOUNTER — Telehealth: Payer: Self-pay | Admitting: Oncology

## 2014-09-14 NOTE — Telephone Encounter (Signed)
Faxed pt medical records to [employer].

## 2014-09-20 ENCOUNTER — Telehealth: Payer: Self-pay | Admitting: *Deleted

## 2014-09-20 NOTE — Telephone Encounter (Signed)
Message from [employer] with [employer] [phone]). Based on a recent review of records the insurance company has determined pt is able to perform an amended list of job duties especially since he is "well enough to travel to [place]." Requested she fax a written request if Dr. Gearldine Shown input is required. She agreed to do so.

## 2014-10-04 ENCOUNTER — Telehealth: Payer: Self-pay

## 2014-10-04 ENCOUNTER — Telehealth: Payer: Self-pay | Admitting: Oncology

## 2014-10-04 ENCOUNTER — Other Ambulatory Visit (HOSPITAL_COMMUNITY): Payer: BC Managed Care – PPO

## 2014-10-04 ENCOUNTER — Ambulatory Visit (HOSPITAL_COMMUNITY)
Admission: RE | Admit: 2014-10-04 | Discharge: 2014-10-04 | Disposition: A | Discharge status: Home / Self Care | Payer: BC Managed Care – PPO | Source: Ambulatory Visit | Attending: Oncology | Admitting: Oncology

## 2014-10-04 ENCOUNTER — Ambulatory Visit (HOSPITAL_BASED_OUTPATIENT_CLINIC_OR_DEPARTMENT_OTHER): Payer: Medicaid Other | Admitting: Nurse Practitioner

## 2014-10-04 VITALS — BP 102/74 | HR 80 | Temp 98.6°F | Resp 18 | Ht 63.0 in | Wt 123.6 lb

## 2014-10-04 DIAGNOSIS — C155 Malignant neoplasm of lower third of esophagus: Secondary | ICD-10-CM

## 2014-10-04 DIAGNOSIS — J42 Unspecified chronic bronchitis: Secondary | ICD-10-CM | POA: Insufficient documentation

## 2014-10-04 DIAGNOSIS — C7800 Secondary malignant neoplasm of unspecified lung: Secondary | ICD-10-CM

## 2014-10-04 DIAGNOSIS — C799 Secondary malignant neoplasm of unspecified site: Secondary | ICD-10-CM | POA: Insufficient documentation

## 2014-10-04 DIAGNOSIS — R05 Cough: Secondary | ICD-10-CM | POA: Diagnosis present

## 2014-10-04 DIAGNOSIS — Z85118 Personal history of other malignant neoplasm of bronchus and lung: Secondary | ICD-10-CM | POA: Diagnosis not present

## 2014-10-04 DIAGNOSIS — J90 Pleural effusion, not elsewhere classified: Secondary | ICD-10-CM | POA: Insufficient documentation

## 2014-10-04 DIAGNOSIS — R0600 Dyspnea, unspecified: Secondary | ICD-10-CM

## 2014-10-04 DIAGNOSIS — C159 Malignant neoplasm of esophagus, unspecified: Secondary | ICD-10-CM | POA: Insufficient documentation

## 2014-10-04 DIAGNOSIS — N19 Unspecified kidney failure: Secondary | ICD-10-CM

## 2014-10-04 DIAGNOSIS — Z23 Encounter for immunization: Secondary | ICD-10-CM

## 2014-10-04 MED ORDER — INFLUENZA VAC SPLIT QUAD 0.5 ML IM SUSY
0.5000 mL | PREFILLED_SYRINGE | Freq: Once | INTRAMUSCULAR | Status: AC
  Administered 2014-10-04: 0.5 mL via INTRAMUSCULAR
  Filled 2014-10-04: qty 0.5

## 2014-10-04 NOTE — Telephone Encounter (Signed)
-----   Message from Ladell Pier, MD sent at 10/04/2014  3:24 PM EST ----- Please call daughter, small rt. Effusion is stable

## 2014-10-04 NOTE — Telephone Encounter (Signed)
gv relative appt schedule for jan 2016 and referral for cxr to be done same day as f/u 11/28/14 - prior to f/u.

## 2014-10-04 NOTE — Patient Instructions (Signed)

## 2014-10-04 NOTE — Progress Notes (Signed)
  [place] OFFICE PROGRESS NOTE   Diagnosis:  Esophagus cancer  INTERVAL HISTORY:   Mr. Ducre returns as scheduled. He has mild dyspnea on exertion. No fever or cough. He reports a good appetite. No polyuria or polydipsia. He denies pain.  Objective:  Vital signs in last 24 hours:  Blood pressure 102/74, pulse 80, temperature 98.6 F (37 C), temperature source Oral, resp. rate 18, height 5\' 3"  (1.6 m), weight 123 lb 9.6 oz (56.065 kg).    HEENT: no thrush or ulcers. Lymphatics: no palpable cervical, supraclavicular or axillary lymph nodes. Resp: faint fine rales at the left lung base. No respiratory distress. Good air movement bilaterally. Cardio: regular rate and rhythm. GI: abdomen soft and nontender. No hepatomegaly. Vascular: no leg edema.  Lab Results:  Lab Results  Component Value Date   WBC 8.9 07/29/2014   HGB 12.8* 07/29/2014   HCT 39.9 07/29/2014   MCV 85.5 07/29/2014   PLT 321 07/29/2014   NEUTROABS 5.9 07/29/2014    Imaging:  No results found.  Medications: I have reviewed the patient's current medications.  Assessment/Plan: 1. 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 Staging CT scans of the chest, abdomen and pelvis on 06/02/2012 negative for evidence of metastatic disease. Endoscopic ultrasound 06/11/2012 confirmed a uT3 uN1 tumor at 40 cm from the incisors. He began radiation 06/15/2012. He began weekly Taxol/carboplatin chemotherapy 06/16/2012. He completed 6 weekly chemotherapy treatments. The carboplatin was held with week 5 due to thrombocytopenia. He completed the sixth weekly treatment with Taxol and carboplatin on 07/23/2012. He completed radiation on 07/29/2012 -he underwent an esophagectomy on 08/26/2012 with the pathology confirming a ypT3,ypN0 squamous cell carcinoma with perineural invasion identified.  2. History of solid/liquid dysphagia  secondary to the obstructing esophagus mass.  3. Reflux. He is taking Protonix. 4. Hospitalization 07/19/2014 with abdominal pain and vomiting. CT scan showed a mid small bowel obstruction without discrete mass or evident etiology. Small bowel obstruction felt to be secondary to adhesions. 5. Renal failure during the 07/19/2014 hospitalization felt to be contrast-induced nephropathy. 6. Chest CT 06/15/2013 with postoperative changes from esophagectomy and gastric pull-through. 7 mm posterior subpleural nodule in the right lower lobe. Followup CT scan 12/16/2013 revealed an increase in the size of a right hilar node and right upper lobe nodule.  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   Disposition: Mr. Ledwell appears stable. We will followup on the chest x-ray from earlier today. He will be leaving later this month for [place]. We will see him back in approximately 8 weeks. He will contact the office in the interim with any problems. We are administering the influenza vaccine today.  Plan reviewed with Dr. Benay Spice.  Ned Card ANP/GNP-BC   10/04/2014  10:20 AM

## 2014-10-04 NOTE — Addendum Note (Signed)
Addended by: Tyler Aas A on: 10/04/2014 10:28 AM   Modules accepted: Orders

## 2014-10-05 ENCOUNTER — Telehealth: Payer: Self-pay | Admitting: *Deleted

## 2014-10-05 NOTE — Telephone Encounter (Signed)
Per Dr. Benay Spice; notified pt daughter Fernande Boyden that small right effusion is stable from chest xray.  Daughter verbalized understanding and expressed appreciation for call.

## 2014-10-05 NOTE — Telephone Encounter (Signed)
-----   Message from Ladell Pier, MD sent at 10/04/2014  3:24 PM EST ----- Please call daughter, small rt. Effusion is stable

## 2014-11-28 ENCOUNTER — Ambulatory Visit (HOSPITAL_COMMUNITY)
Admission: RE | Admit: 2014-11-28 | Discharge: 2014-11-28 | Disposition: A | Discharge status: Home / Self Care | Payer: Medicare Other | Source: Ambulatory Visit | Attending: Nurse Practitioner | Admitting: Nurse Practitioner

## 2014-11-28 ENCOUNTER — Telehealth: Payer: Self-pay | Admitting: Oncology

## 2014-11-28 ENCOUNTER — Ambulatory Visit (HOSPITAL_BASED_OUTPATIENT_CLINIC_OR_DEPARTMENT_OTHER): Payer: Medicare Other | Admitting: Oncology

## 2014-11-28 VITALS — BP 113/65 | HR 65 | Temp 98.0°F | Resp 18 | Ht 63.0 in | Wt 123.6 lb

## 2014-11-28 DIAGNOSIS — C159 Malignant neoplasm of esophagus, unspecified: Secondary | ICD-10-CM | POA: Diagnosis not present

## 2014-11-28 DIAGNOSIS — C155 Malignant neoplasm of lower third of esophagus: Secondary | ICD-10-CM | POA: Diagnosis not present

## 2014-11-28 DIAGNOSIS — C7800 Secondary malignant neoplasm of unspecified lung: Secondary | ICD-10-CM | POA: Diagnosis not present

## 2014-11-28 DIAGNOSIS — Z87891 Personal history of nicotine dependence: Secondary | ICD-10-CM | POA: Diagnosis not present

## 2014-11-28 DIAGNOSIS — R918 Other nonspecific abnormal finding of lung field: Secondary | ICD-10-CM | POA: Insufficient documentation

## 2014-11-28 NOTE — Progress Notes (Signed)
[place] OFFICE PROGRESS NOTE   Diagnosis: Esophagus cancer  INTERVAL HISTORY:   He returns as scheduled. He recently took a trip to [place]. He reports increased reflux symptoms. He has a headache lasting 10-15 minutes in the mornings frequently. No associated symptoms. He is not taking pain medication. No dyspnea. No focal neurologic symptoms.  Objective:  Vital signs in last 24 hours:  Blood pressure 113/65, pulse 65, temperature 98 F (36.7 C), resp. rate 18, height 5\' 3"  (1.6 m), weight 123 lb 9.6 oz (56.065 kg), SpO2 100 %.    HEENT: No sinus tenderness. Lymphatics: No cervical, supraclavicular, or axillary nodes Resp: Lungs clear bilaterally Cardio: Regular rate and rhythm GI: No hepatomegaly, nontender Vascular: No leg edema Neuro: He is alert. He follows commands. The motor exam appears grossly intact. Finger to nose testing is normal.     Imaging:  Dg Chest 2 View  11/28/2014   CLINICAL DATA:  Esophageal cancer, ex smoker.  EXAM: CHEST  2 VIEW  COMPARISON:  10/04/2014.  FINDINGS: Trachea is midline. Heart size stable. Pleural parenchymal opacification at the base of the right hemi thorax is again noted, with possible slight increase in the amount of pleural fluid. Postoperative changes in the right hemi thorax are seen as well. Probable mild scarring at the left lung base. No left pleural fluid. Postoperative changes of esophagectomy and gastric pull-through are better evaluated on cross-sectional imaging.  IMPRESSION: Pleural parenchymal opacification in the right hemi thorax, as before, with possible slight increase in the amount of pleural fluid.   Electronically Signed   By: Lorin Picket M.D.   On: 11/28/2014 10:14    Medications: I have reviewed the patient's current medications.  Assessment/Plan: 1. squamous cell carcinoma. Staging CT scans of the chest, abdomen and pelvis on 06/02/2012 negative for evidence of metastatic disease. Endoscopic  ultrasound 06/11/2012 confirmed a uT3 uN1 tumor at 40 cm from the incisors. He began radiation 06/15/2012. He began weekly Taxol/carboplatin chemotherapy 06/16/2012. He completed 6 weekly chemotherapy treatments. The carboplatin was held with week 5 due to thrombocytopenia. He completed the sixth weekly treatment with Taxol and carboplatin on 07/23/2012. He completed radiation on 07/29/2012 -he underwent an esophagectomy on 08/26/2012 with the pathology confirming a ypT3,ypN0 squamous cell carcinoma with perineural invasion identified.  2. History of solid/liquid dysphagia secondary to the obstructing esophagus mass.  3. Reflux. He is taking Protonix. 4. Hospitalization 07/19/2014 with abdominal pain and vomiting. CT scan showed a mid small bowel obstruction without discrete mass or evident etiology. Small bowel obstruction felt to be secondary to adhesions. 5. Renal failure during the 07/19/2014 hospitalization felt to be contrast-induced nephropathy. 6. Chest CT 06/15/2013 with postoperative changes from esophagectomy and gastric pull-through. 7 mm posterior subpleural nodule in the right lower lobe. Followup CT scan 12/16/2013 revealed an increase in the size of a right hilar node and right upper lobe nodule.  CT biopsy of the right upper lobe nodule on 12/27/2013 confirmed invasive squamous cell carcinoma, TTF-1 negative Staging PET scan 01/11/2014 confirmed a hypermetabolic cavitary right upper lobe mass, hypermetabolic right hilar node   Status post wedge resection of a right upper lung mass and lymph node sampling 01/31/2014, the pathology confirmed a 2.1 cm squamous cell carcinoma in the right upper lobe with lymphovascular invasion with metastatic squamous cell carcinoma in a right paraesophageal node-favored to represent metastatic esophagus cancer   CT 07/04/2014 with a persistent right hilar lymph node, right pleural effusion, stable 6 mm lymph  node at the descending thoracic aorta. 7.   Headaches    Disposition:  Bruce [name] has metastatic esophagus cancer. I reviewed the chest x-ray with the patient and his daughter. Right pleural effusion appears stable. I doubt the headaches are related to esophagus cancer. He will contact us for increased headaches or the development of neurologic symptoms. He will return for an office visit and restaging CT evaluation in one month.  Bruce [name] has metastatic esophagus cancer. The cancer will progress in the future. I feel he should be considered disabled secondary to his medical condition.  Betsy Coder, MD  11/28/2014  11:29 AM

## 2014-11-28 NOTE — Telephone Encounter (Signed)
gv adn printeda ptp sched and avs for pt for Feb

## 2014-12-13 ENCOUNTER — Other Ambulatory Visit: Payer: Self-pay | Admitting: Cardiothoracic Surgery

## 2014-12-13 DIAGNOSIS — C7800 Secondary malignant neoplasm of unspecified lung: Secondary | ICD-10-CM

## 2014-12-15 ENCOUNTER — Ambulatory Visit (INDEPENDENT_AMBULATORY_CARE_PROVIDER_SITE_OTHER): Payer: Medicare Other | Admitting: Cardiothoracic Surgery

## 2014-12-15 ENCOUNTER — Encounter: Payer: Self-pay | Admitting: Cardiothoracic Surgery

## 2014-12-15 ENCOUNTER — Ambulatory Visit
Admission: RE | Admit: 2014-12-15 | Discharge: 2014-12-15 | Disposition: A | Discharge status: Home / Self Care | Payer: Medicaid Other | Source: Ambulatory Visit | Attending: Cardiothoracic Surgery | Admitting: Cardiothoracic Surgery

## 2014-12-15 VITALS — BP 116/75 | HR 74 | Resp 16 | Wt 126.0 lb

## 2014-12-15 DIAGNOSIS — R531 Weakness: Secondary | ICD-10-CM | POA: Diagnosis not present

## 2014-12-15 DIAGNOSIS — Z9889 Other specified postprocedural states: Secondary | ICD-10-CM

## 2014-12-15 DIAGNOSIS — Z9049 Acquired absence of other specified parts of digestive tract: Secondary | ICD-10-CM

## 2014-12-15 DIAGNOSIS — C159 Malignant neoplasm of esophagus, unspecified: Secondary | ICD-10-CM | POA: Diagnosis not present

## 2014-12-15 DIAGNOSIS — Z8501 Personal history of malignant neoplasm of esophagus: Secondary | ICD-10-CM | POA: Diagnosis not present

## 2014-12-15 DIAGNOSIS — C3411 Malignant neoplasm of upper lobe, right bronchus or lung: Secondary | ICD-10-CM | POA: Diagnosis not present

## 2014-12-15 DIAGNOSIS — C3491 Malignant neoplasm of unspecified part of right bronchus or lung: Secondary | ICD-10-CM | POA: Diagnosis not present

## 2014-12-15 DIAGNOSIS — J984 Other disorders of lung: Secondary | ICD-10-CM | POA: Diagnosis not present

## 2014-12-15 DIAGNOSIS — C7800 Secondary malignant neoplasm of unspecified lung: Secondary | ICD-10-CM

## 2014-12-15 NOTE — Progress Notes (Signed)
[facility] Record #[id number] Date of Birth: 1961-01-14  Referring: No ref. provider found Primary Care: No PCP Per Patient  Chief Complaint:    Chief Complaint  Patient presents with  . Routine Post Op    3 month f/u with cxr    History of Present Illness:    Bruce [name] 53 y.o. male  underwent total esophagectomy with cervical esophagogastrostomy October 2013 after a course of preoperative radiation and chemotherapy for squamous cell carcinoma of the esophagus. On followup scans the patient was noted to have an increasing right lung lesion.  The patient underwent  wedge resection of right lung lesion and node dissection, final pathology is suggestive of squamous cell from known metastatic from esophageal primary . Patient completed trip to [place], now notes episodic headaches and increasing fatigue.    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   Zubrod Score: At the time of surgery this patient's most appropriate activity status/level should be described as: []     0    Normal activity, no symptoms [x]     1    Restricted in physical strenuous activity but ambulatory, able to do out light work []     2    Ambulatory and capable of self care, unable to do work activities, up and about               >50 % of waking hours                              []     3    Only limited self  care, in bed greater than 50% of waking hours []     4    Completely disabled, no self care, confined to bed or chair []     5    Moribund   Past Medical History  Diagnosis Date  . Esophagitis   . Mass of esophagus 05/28/2012    BX'D DISTAL ESOPHAGUS,PENDING  . Dysphasia     solid and liquid  . Neuropathy     right hand numbness 1 year 2012  . 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 Lung cancer 12/27/13    right upper lung    Past Surgical History  Procedure Laterality Date  . 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 Eus  06/11/2012    Procedure: UPPER ENDOSCOPIC ULTRASOUND (EUS) RADIAL;  Surgeon: Milus Banister, MD;  Location: [facility];  Service: Endoscopy;  Laterality: N/A;  . Complete esophagectomy  08/26/2012    Procedure: ESOPHAGECTOMY COMPLETE;  Surgeon: [name], MD;  Location: [facility];  Service: Thoracic;  Laterality: N/A;  transhiatal   . Pyloroplasty  08/26/2012    Procedure:  PYLOROPLASTY;  Surgeon: [name], MD;  Location: [facility];  Service: Thoracic;  Laterality: N/A;  . Video bronchoscopy  08/26/2012    Procedure: VIDEO BRONCHOSCOPY;  Surgeon: [name], MD;  Location: [facility];  Service: Thoracic;  Laterality: N/A;  . Jejunostomy  08/26/2012    Procedure: Shanon Rosser;  Surgeon: [name], MD;  Location: [facility];  Service: Thoracic;  Laterality: N/A;  placement of feeding jujunostomy tube  . Video bronchoscopy N/A 01/31/2014    Procedure: VIDEO BRONCHOSCOPY;  Surgeon: [name], MD;  Location: [facility] OR;  Service: Thoracic;  Laterality: N/A;  . Video assisted thoracoscopy (vats)/wedge resection Right 01/31/2014    Procedure: VIDEO ASSISTED THORACOSCOPY (VATS)/WEDGE RESECTION; with insertion of On Q pain pump;  Surgeon: [name], MD;  Location: [facility];  Service: Thoracic;  Laterality: Right;  with insertion of On Q pain pump  . Lymph node  dissection Right 01/31/2014    Procedure: LYMPH NODE DISSECTION;  Surgeon: [name], MD;  Location: [facility] OR;  Service: Thoracic;  Laterality: Right;    Family History  Problem Relation Age of Onset  . Breast cancer Sister     History   Social History  . Marital Status: Married    Spouse Name: N/A    Number of Children: N/A  . Years of Education: N/A   Occupational History  . welder    Social History Main Topics  . Smoking status: Former Smoker -- 0.40 packs/day for 25 years    Types: Cigarettes    Quit date: 06/19/2011  . Smokeless tobacco: Former Systems developer    Quit date: 06/03/2007  . Alcohol Use: No     Comment: not currently (01/28/14)  . Drug Use: No  . Sexual Activity: Not on file      History  Smoking status  . Former Smoker -- 0.40 packs/day for 25 years  . Types: Cigarettes  . Quit date: 06/19/2011  Smokeless tobacco  . Former Systems developer  . Quit date: 06/03/2007    History  Alcohol Use No    Comment: not currently (01/28/14)     No Known Allergies  Current Outpatient Prescriptions  Medication Sig Dispense Refill  . pantoprazole (PROTONIX) 40 MG tablet Take 40 mg by mouth daily as needed (heart burn).     No current facility-administered medications for this visit.     Review of Systems:     Cardiac Review of Systems: Y or N  Chest Pain [ n   ]  Resting SOB [ n  ] Exertional SOB  [n  ]  Orthopnea [ n ]   Pedal Edema [ n  ]    Palpitations [ n ] Syncope  [ n ]   Presyncope [  n ]  General Review of Systems: [Y] = yes [  ]=no Constitional: recent weight change [n  ];  Wt loss over the last 3 months [   ] anorexia [  ]; fatigue Blue.Reese  ]; nausea [ n ]; night sweats [ n ]; fever [ n ]; or chills [n  ];          Dental: poor dentition[n  ]; Last Dentist visit:   Eye : blurred vision [  ]; diplopia [   ]; vision changes [  ];  Amaurosis fugax[  ]; Resp: cough [  ];  wheezing[n  ];  hemoptysis[ n ]; shortness of breath[ n ]; paroxysmal nocturnal dyspnea[ n ];  dyspnea  on exertion[ n ]; or orthopnea[  ];  GI:  gallstones[  ], vomiting[ n ];  dysphagia[  ]; melena[ n ];  hematochezia [n  ]; heartburn[ y ];   Hx of  Colonoscopy[  ]; GU: kidney stones [  ]; hematuria[  ];   dysuria [n  ];  nocturia[  ];  history of     obstruction [ n ]; urinary frequency [ n ]             Skin: rash, swelling[  ];, hair loss[  ];  peripheral edema[  ];  or itching[  ]; Musculosketetal: myalgias[  ];  joint swelling[  ];  joint erythema[  ];  joint pain[  ];  back pain[  ];  Heme/Lymph: bruising[  ];  bleeding[n  ];  anemia[  ];  Neuro: TIA[  ];  headaches[  ];  stroke[ n ];  vertigo[  ];  seizures[  ];   paresthesias[  ];  difficulty walking[n  ];  Psych:depression[  ]; anxiety[  ];  Endocrine: diabetes[ n ];  thyroid dysfunction[n  ];  Immunizations: Flu up to date Blue.Reese  ]; Pneumococcal up to date [  y];  Other:  Physical Exam: BP 116/75 mmHg  Pulse 74  Resp 16  Wt 126 lb (57.153 kg)  SpO2 98%  PHYSICAL EXAMINATION:  General appearance: alert, cooperative, appears stated age and no distress Neurologic: intact Heart: regular rate and rhythm, S1, S2 normal, no murmur, click, rub or gallop Lungs: clear to auscultation bilaterally Abdomen: soft, non-tender; bowel sounds normal; no masses,  no organomegaly Extremities: extremities normal, atraumatic, no cyanosis or edema and Homans sign is negative, no sign of DVT Wound: Patient's incisions in the left neck and mid abdomen are well-healed, The right chest incision is well-healed without evidence of infection Patient has no cervical or supraclavicular or axillary adenopathy.  Diagnostic Studies & Laboratory data:     Recent Radiology Findings: Dg Chest 2 View  12/15/2014   CLINICAL DATA:  Known squamous cell malignancy of the right lung with metastatic disease, history of esophageal malignancy ; history of the 8 CS in March of 2015; the patient reports generalize weakness but no acute chest complaints  EXAM: CHEST   2 VIEW  COMPARISON:  PA and lateral chest x-ray of November 28, 2014  FINDINGS: The left lung is well-expanded. There are coarse retrocardiac lung markings which are not new. There is chronic volume loss on the right. There is a chronic small right pleural effusion. No alveolar infiltrate or parenchymal masses demonstrated. There is stable increased density in the right paratracheal region. The cardiac silhouette is top-normal in size. The pulmonary vascularity is not engorged. The bony thorax is unremarkable.  IMPRESSION: Allowing for changes in positioning there has not been dramatic interval change the the postsurgical changes at the right lung base as well as in the right paratracheal density in the left retrocardiac density.   Electronically Signed   By: David  Martinique   On: 12/15/2014 14:13   Dg Chest 2 View  11/28/2014   CLINICAL DATA:  Esophageal cancer, ex smoker.  EXAM: CHEST  2 VIEW  COMPARISON:  10/04/2014.  FINDINGS: Trachea is midline. Heart size stable. Pleural parenchymal opacification at the base of the right hemi thorax is again noted, with possible slight increase in the amount of pleural fluid. Postoperative changes in the right hemi thorax are seen as well. Probable mild scarring at the left lung base. No  left pleural fluid. Postoperative changes of esophagectomy and gastric pull-through are better evaluated on cross-sectional imaging.  IMPRESSION: Pleural parenchymal opacification in the right hemi thorax, as before, with possible slight increase in the amount of pleural fluid.   Electronically Signed   By: Lorin Picket M.D.   On: 11/28/2014 10:14   I have independently reviewed the above radiology studies  and reviewed the findings with the patient and his daughter.   Dg Chest 2 View  08/25/2014   CLINICAL DATA:  Metastatic esophageal cancer. Evaluate for right pleural effusion  EXAM: CHEST  2 VIEW  COMPARISON:  Chest radiograph 07/19/2014 and CT chest 07/04/2014  FINDINGS: Stable  heart and mediastinal contours. Small right pleural effusion is without significant change compared chest radiograph of 07/19/2014. Stable small volume of pleural fluid or pleural thickening over the right lung apex. Persistent linear opacities in the right midlung. Mild left basilar atelectasis. Slight chronic prominence of the interstitial markings of the lung bases. No acute osseous abnormality identified.  IMPRESSION: No significant change in small right pleural effusion.  Chronic mild bibasilar interstitial prominence and no significant change in linear opacities in the right midlung. Left basilar atelectasis.   Electronically Signed   By: Curlene Dolphin M.D.   On: 08/25/2014 14:39   Dg Chest 1 View  12/27/2013   CLINICAL DATA:  Post right upper lobe nodule biopsy, no chest pain or shortness of breath.  EXAM: CHEST - 1 VIEW  COMPARISON:  CT BIOPSY dated 12/27/2013; CT CHEST W/CM dated 12/16/2013; DG CHEST 2 VIEW dated 03/04/2013  FINDINGS: Grossly unchanged borderline enlarged cardiac silhouette and mediastinal contours. Known centrally cavitary nodule within the right upper lobe is not well demonstrated on the present examination. No pleural effusion or pneumothorax following right upper lobe lung nodule biopsy. No evidence of edema. Unchanged bones.  IMPRESSION: No evidence of complication following right upper lung nodule biopsy.   Electronically Signed   By: Sandi Mariscal M.D.   On: 12/27/2013 12:01   Nm Pet Image Restag (ps) Skull Base To Thigh  01/11/2014   CLINICAL DATA:  Subsequent treatment strategy for esophageal cancer. Recent biopsy of a cavitary right upper lobe lung lesion revealed invasive squamous cell carcinoma.  EXAM: NUCLEAR MEDICINE PET SKULL BASE TO THIGH  FASTING BLOOD GLUCOSE:  Value: 92 mg/dl  TECHNIQUE: 6.3 mCi F-18 FDG was injected intravenously. Full-ring PET imaging was performed from the skull base to thigh after the radiotracer. CT data was obtained and used for attenuation correction  and anatomic localization.  COMPARISON:  CT BIOPSY dated 12/27/2013  FINDINGS: NECK  No hypermetabolic lymph nodes in the neck.  CHEST  Gastric pull-through noted. Cavitary posterior right upper lobe mass, 2.1 x 1.5 cm, maximum standard uptake value 7.7. Posterior left infrahilar lymph node, 2.3 x 2.0 cm, maximum standard uptake value 9.8. Adjacent indistinct infrahilar node has maximum standard uptake value of 5.9.  A vascular structure or small lymph node posterior to the descending thoracic aorta has a maximum standard uptake value of 3.4 and is probably incidental.  ABDOMEN/PELVIS  Mid transverse colon has some faintly increased signal but is also small in caliber, unless this increased signal (standard uptake value 6.1) is probably physiologic. Mildly prominent but gas-filled appendix does not appear actively inflamed. It is  SKELETON  No focal hypermetabolic activity to suggest skeletal metastasis.  IMPRESSION: 1. Hypermetabolic cavitary right upper lobe mass corresponding to the known invasive squamous cell carcinoma. Pathologic right infrahilar adenopathy. 2. Faintly  increased activity in the mid transverse colon is probably physiologic.   Electronically Signed   By: Sherryl Barters M.D.   On: 01/11/2014 11:46   Ct Biopsy  12/27/2013   INDICATION: History of esophageal cancer, now with enlarging centrally necrotic nodule within the right upper lobe. Please obtain biopsy to evaluate for either metastatic disease versus primary lung cancer.  EXAM: CT BIOPSY  MEDICATIONS: Fentanyl 75 mcg IV; Versed 1.5 mg IV  ANESTHESIA/SEDATION: Sedation time  20 minutes  CONTRAST:  None  COMPARISON:  NM PET IMAGE INITIAL (PI) SKULL BASE TO THIGH dated 06/11/2012; CT CHEST W/CM dated 12/16/2013; CT CHEST W/CM dated 06/15/2013  PROCEDURE: Informed consent was obtained from the patient (via the use of a medical translator) following an explanation of the procedure, risks, benefits and alternatives. The patient understands,agrees  and consents for the procedure. All questions were addressed. A time out was performed prior to the initiation of the procedure.  The patient was positioned right lateral decubitus on the CT table and a limited chest CT was performed for procedural planning demonstrating no change to minimal increase in size of the now approximately 1.7 x 1.4 cm nodule within the right upper lobe (image 5, series 3). Note, there has been interval increased central cavitation of this nodule. The operative site was prepped and draped in the usual sterile fashion. Under sterile conditions and local anesthesia, a 22 gauge spinal needle was utilized for procedural planning. This was followed by the placement of a 17 gauge coaxial needle into the peripheral caudal aspect of the nodule. Positioning was confirmed with intermittent CT fluoroscopy and followed by the acquisition of 2 core needle biopsies with an 18 gauge core needle biopsy device.  Limited post procedural chest CT was negative for pneumothorax or additional complication. The co-axial needle was removed and hemostasis was achieved with manual compression. A dressing was placed. The patient tolerated the procedure well without immediate postprocedural complication. The patient was escorted to have an upright chest radiograph.  COMPLICATIONS: None immediate.  IMPRESSION: Technically successful CT guided core needle core biopsy of enlarging centrally necrotic nodule within the right upper lobe.   Electronically Signed   By: Sandi Mariscal M.D.   On: 12/27/2013 13:19      Recent Lab Findings: Lab Results  Component Value Date   WBC 8.9 07/29/2014   HGB 12.8* 07/29/2014   HCT 39.9 07/29/2014   PLT 321 07/29/2014   GLUCOSE 95 08/25/2014   ALT 22 08/25/2014   AST 19 08/25/2014   NA 141 08/25/2014   K 4.2 08/25/2014   CL 100 07/26/2014   CREATININE 1.1 08/25/2014   BUN 10.4 08/25/2014   CO2 28 08/25/2014   TSH 0.853 07/20/2014   INR 1.13 07/21/2014   HGBA1C 6.2*  07/20/2014      Assessment / Plan:   Advanced stage esophageal cancer, currently under observation only. Patient is maintained his nutritional status adequately in spite of a recent admission for partial small bowel obstruction. Small right pleural effusion appear stable Patient has followup ct of chest and MRI of brain due to complaints of head ache in early Febuary I plan to see back in 6 months or at Dr. Benay Spice as requested anytime. Case discussed with Dr Benay Spice today.   [name] MD      [address] [place], [address] Office [phone]   Beeper [phone]  12/15/2014 2:38 PM

## 2014-12-23 ENCOUNTER — Ambulatory Visit (HOSPITAL_COMMUNITY)
Admission: RE | Admit: 2014-12-23 | Discharge: 2014-12-23 | Disposition: A | Discharge status: Home / Self Care | Payer: Medicare Other | Source: Ambulatory Visit | Attending: Oncology | Admitting: Oncology

## 2014-12-23 ENCOUNTER — Encounter (HOSPITAL_COMMUNITY): Payer: Self-pay

## 2014-12-23 ENCOUNTER — Ambulatory Visit (HOSPITAL_COMMUNITY): Payer: Medicare Other

## 2014-12-23 DIAGNOSIS — C159 Malignant neoplasm of esophagus, unspecified: Secondary | ICD-10-CM | POA: Diagnosis not present

## 2014-12-23 DIAGNOSIS — Z720 Tobacco use: Secondary | ICD-10-CM | POA: Insufficient documentation

## 2014-12-23 DIAGNOSIS — C155 Malignant neoplasm of lower third of esophagus: Secondary | ICD-10-CM | POA: Diagnosis not present

## 2014-12-23 DIAGNOSIS — R51 Headache: Secondary | ICD-10-CM | POA: Diagnosis not present

## 2014-12-23 DIAGNOSIS — J8410 Pulmonary fibrosis, unspecified: Secondary | ICD-10-CM | POA: Diagnosis not present

## 2014-12-23 DIAGNOSIS — J439 Emphysema, unspecified: Secondary | ICD-10-CM | POA: Diagnosis not present

## 2014-12-23 MED ORDER — IOHEXOL 300 MG/ML  SOLN
100.0000 mL | Freq: Once | INTRAMUSCULAR | Status: AC | PRN
  Administered 2014-12-23: 100 mL via INTRAVENOUS

## 2014-12-26 ENCOUNTER — Ambulatory Visit (HOSPITAL_BASED_OUTPATIENT_CLINIC_OR_DEPARTMENT_OTHER): Payer: Medicare Other | Admitting: Nurse Practitioner

## 2014-12-26 ENCOUNTER — Other Ambulatory Visit (HOSPITAL_BASED_OUTPATIENT_CLINIC_OR_DEPARTMENT_OTHER): Payer: Medicare Other

## 2014-12-26 ENCOUNTER — Telehealth: Payer: Self-pay | Admitting: Oncology

## 2014-12-26 VITALS — BP 110/67 | HR 99 | Temp 98.4°F | Resp 18 | Ht 63.0 in | Wt 123.1 lb

## 2014-12-26 DIAGNOSIS — J90 Pleural effusion, not elsewhere classified: Secondary | ICD-10-CM | POA: Diagnosis not present

## 2014-12-26 DIAGNOSIS — C7800 Secondary malignant neoplasm of unspecified lung: Secondary | ICD-10-CM

## 2014-12-26 DIAGNOSIS — C7801 Secondary malignant neoplasm of right lung: Secondary | ICD-10-CM | POA: Diagnosis not present

## 2014-12-26 DIAGNOSIS — C155 Malignant neoplasm of lower third of esophagus: Secondary | ICD-10-CM

## 2014-12-26 LAB — COMPREHENSIVE METABOLIC PANEL (CC13)
ALK PHOS: 99 U/L (ref 40–150)
ALT: 13 U/L (ref 0–55)
ANION GAP: 9 meq/L (ref 3–11)
AST: 18 U/L (ref 5–34)
Albumin: 3.5 g/dL (ref 3.5–5.0)
BILIRUBIN TOTAL: 0.5 mg/dL (ref 0.20–1.20)
BUN: 13.2 mg/dL (ref 7.0–26.0)
CALCIUM: 8.6 mg/dL (ref 8.4–10.4)
CHLORIDE: 109 meq/L (ref 98–109)
CO2: 22 mEq/L (ref 22–29)
CREATININE: 1.3 mg/dL (ref 0.7–1.3)
EGFR: 64 mL/min/{1.73_m2} — ABNORMAL LOW (ref >90)
Glucose: 247 mg/dl — ABNORMAL HIGH (ref 70–140)
Potassium: 4.3 mEq/L (ref 3.5–5.1)
SODIUM: 139 meq/L (ref 136–145)
Total Protein: 6.8 g/dL (ref 6.4–8.3)

## 2014-12-26 NOTE — Telephone Encounter (Signed)
Gave avs & calendar for April. °

## 2014-12-26 NOTE — Progress Notes (Addendum)
[facility] OFFICE PROGRESS NOTE   Diagnosis:  Esophagus cancer  INTERVAL HISTORY:   Bruce [name] returns as scheduled. He continues to have daily headaches. No nausea or vomiting. Bowels are moving. No dysphagia. He notes shortness of breath when he becomes "tired".  Objective:  Vital signs in last 24 hours:  Blood pressure 110/67, pulse 99, temperature 98.4 F (36.9 C), temperature source Oral, resp. rate 18, height 5\' 3"  (1.6 m), weight 123 lb 1.6 oz (55.838 kg), SpO2 100 %.    HEENT: No thrush or ulcers. Lymphatics: No palpable cervical or supra-clavicular lymph nodes. Resp: Breath sounds mildly diminished at the right base. Lungs are clear. No respiratory distress. Cardio: Regular rate and rhythm. GI: No hepatomegaly. Vascular: No leg edema.   Lab Results:  Lab Results  Component Value Date   WBC 8.9 07/29/2014   HGB 12.8* 07/29/2014   HCT 39.9 07/29/2014   MCV 85.5 07/29/2014   PLT 321 07/29/2014   NEUTROABS 5.9 07/29/2014    Imaging:  No results found.  Medications: I have reviewed the patient's current medications.  Assessment/Plan: 1. Squamous cell carcinoma. Staging CT scans of the chest, abdomen and pelvis on 06/02/2012 negative for evidence of metastatic disease. Endoscopic ultrasound 06/11/2012 confirmed a uT3 uN1 tumor at 40 cm from the incisors. He began radiation 06/15/2012. He began weekly Taxol/carboplatin chemotherapy 06/16/2012. He completed 6 weekly chemotherapy treatments. The carboplatin was held with week 5 due to thrombocytopenia. He completed the sixth weekly treatment with Taxol and carboplatin on 07/23/2012. He completed radiation on 07/29/2012 -he underwent an esophagectomy on 08/26/2012 with the pathology confirming a ypT3,ypN0 squamous cell carcinoma with perineural invasion identified.  2. History of solid/liquid dysphagia secondary to the obstructing esophagus mass.  3. Reflux. He is taking Protonix. 4. Hospitalization  07/19/2014 with abdominal pain and vomiting. CT scan showed a mid small bowel obstruction without discrete mass or evident etiology. Small bowel obstruction felt to be secondary to adhesions. 5. Renal failure during the 07/19/2014 hospitalization felt to be contrast-induced nephropathy. 6. Chest CT 06/15/2013 with postoperative changes from esophagectomy and gastric pull-through. 7 mm posterior subpleural nodule in the right lower lobe. Followup CT scan 12/16/2013 revealed an increase in the size of a right hilar node and right upper lobe nodule.  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  Chest CT 12/23/2014 with enlarged right hilar lymph node measuring 1.2 cm, previously 0.8 cm. Slight enlargement of a periaortic lymph node, 8 mm as compared to 7 mm on the previous study. Increased thickening of the right pleural rind. Moderate sized right pleural effusion. 7. Headaches. Negative brain CT 12/23/2014.   Disposition: Bruce [name] appears stable. He has metastatic esophagus cancer. The restaging chest CT shows mild increase in the size of a periaortic lymph node and question mild increase in the right pleural effusion. He remains asymptomatic. We will continue to follow with observation.  He will return for a follow-up visit and chest x-ray in 8 weeks. He  will contact the office in the interim with any problems. We specifically discussed shortness of breath, cough.  We continue to feel Bruce [name] should be considered disabled secondary to his medical condition.  Patient seen with Dr. Benay Spice. CT images were reviewed on the computer with Bruce [name] and his daughter.  An interpreter was present throughout today's visit.    Ned Card ANP/GNP-BC   12/26/2014  12:03 PM  This was a shared visit with Ned Card.  The plan is to refer him to Dr. Servando Snare for a Pleurx if there is significant progression of the effusion or he develops new symptoms.  Julieanne Manson, MD

## 2015-01-02 ENCOUNTER — Other Ambulatory Visit: Payer: Self-pay | Admitting: *Deleted

## 2015-01-02 ENCOUNTER — Encounter: Payer: Self-pay | Admitting: Oncology

## 2015-01-02 ENCOUNTER — Other Ambulatory Visit: Payer: Self-pay | Admitting: Oncology

## 2015-01-02 DIAGNOSIS — C7800 Secondary malignant neoplasm of unspecified lung: Secondary | ICD-10-CM

## 2015-01-02 DIAGNOSIS — C155 Malignant neoplasm of lower third of esophagus: Secondary | ICD-10-CM

## 2015-02-16 ENCOUNTER — Ambulatory Visit (HOSPITAL_COMMUNITY)
Admission: RE | Admit: 2015-02-16 | Discharge: 2015-02-16 | Disposition: A | Discharge status: Home / Self Care | Payer: Medicare Other | Source: Ambulatory Visit | Attending: Nurse Practitioner | Admitting: Nurse Practitioner

## 2015-02-16 DIAGNOSIS — C155 Malignant neoplasm of lower third of esophagus: Secondary | ICD-10-CM | POA: Diagnosis not present

## 2015-02-16 DIAGNOSIS — C7800 Secondary malignant neoplasm of unspecified lung: Secondary | ICD-10-CM | POA: Diagnosis not present

## 2015-02-16 DIAGNOSIS — R0602 Shortness of breath: Secondary | ICD-10-CM | POA: Diagnosis not present

## 2015-02-16 DIAGNOSIS — J90 Pleural effusion, not elsewhere classified: Secondary | ICD-10-CM | POA: Diagnosis not present

## 2015-02-20 ENCOUNTER — Telehealth: Payer: Self-pay | Admitting: Oncology

## 2015-02-20 ENCOUNTER — Ambulatory Visit (HOSPITAL_BASED_OUTPATIENT_CLINIC_OR_DEPARTMENT_OTHER): Payer: Medicare Other | Admitting: Oncology

## 2015-02-20 VITALS — BP 115/72 | HR 62 | Temp 97.8°F | Resp 18 | Ht 63.0 in | Wt 120.8 lb

## 2015-02-20 DIAGNOSIS — C155 Malignant neoplasm of lower third of esophagus: Secondary | ICD-10-CM

## 2015-02-20 DIAGNOSIS — C7801 Secondary malignant neoplasm of right lung: Secondary | ICD-10-CM | POA: Diagnosis not present

## 2015-02-20 DIAGNOSIS — R1310 Dysphagia, unspecified: Secondary | ICD-10-CM | POA: Diagnosis not present

## 2015-02-20 NOTE — Progress Notes (Signed)
[facility] OFFICE PROGRESS NOTE   Diagnosis: Esophagus cancer  INTERVAL HISTORY:   He returns as scheduled. He complains of food sticking at the upper chest intermittently. Mild exertional dyspnea.  Objective:  Vital signs in last 24 hours:  Blood pressure 115/72, pulse 62, temperature 97.8 F (36.6 C), temperature source Oral, resp. rate 18, height 5\' 3"  (1.6 m), weight 120 lb 12.8 oz (54.795 kg), SpO2 100 %.    HEENT: Neck without mass Lymphatics: No cervical, or supraclavicular nodes. "Shotty "bilateral axillary nodes Resp: Lungs clear bilaterally with decreased breath sounds at the right base, no respiratory distress Cardio: Regular rate and rhythm GI: No hepatomegaly, nontender no mass Vascular: No leg edema   Imaging:  Dg Chest 2 View  02/16/2015   CLINICAL DATA:  Shortness of breath. History of esophageal cancer. Followup right pleural effusion.  EXAM: CHEST  2 VIEW  COMPARISON:  Multiple prior chest x-rays and a chest CT from 12/23/2014  FINDINGS: The cardiac silhouette, mediastinal hilar contours are within normal limits and stable. Stable surgical changes from esophagectomy and gastric pull-through. There is a persistent right-sided pleural effusion and scarring changes in both lungs. No definite new/acute pulmonary findings. The bony thorax is intact.  IMPRESSION: Chronic postoperative changes and chronic lung disease.  Stable right-sided pleural effusion.   Electronically Signed   By: Marijo Sanes M.D.   On: 02/16/2015 16:46    Medications: I have reviewed the patient's current medications.  Assessment/Plan: 1. Squamous cell carcinoma. Staging CT scans of the chest, abdomen and pelvis on 06/02/2012 negative for evidence of metastatic disease. Endoscopic ultrasound 06/11/2012 confirmed a uT3 uN1 tumor at 40 cm from the incisors. He began radiation 06/15/2012. He began weekly Taxol/carboplatin chemotherapy 06/16/2012. He completed 6 weekly chemotherapy  treatments. The carboplatin was held with week 5 due to thrombocytopenia. He completed the sixth weekly treatment with Taxol and carboplatin on 07/23/2012. He completed radiation on 07/29/2012 -he underwent an esophagectomy on 08/26/2012 with the pathology confirming a ypT3,ypN0 squamous cell carcinoma with perineural invasion identified.  2. History of solid/liquid dysphagia secondary to the obstructing esophagus mass.  3. Reflux. He is taking Protonix. 4. Hospitalization 07/19/2014 with abdominal pain and vomiting. CT scan showed a mid small bowel obstruction without discrete mass or evident etiology. Small bowel obstruction felt to be secondary to adhesions. 5. Renal failure during the 07/19/2014 hospitalization felt to be contrast-induced nephropathy. 6. Chest CT 06/15/2013 with postoperative changes from esophagectomy and gastric pull-through. 7 mm posterior subpleural nodule in the right lower lobe. Followup CT scan 12/16/2013 revealed an increase in the size of a right hilar node and right upper lobe nodule.  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  Chest CT 12/23/2014 with enlarged right hilar lymph node measuring 1.2 cm, previously 0.8 cm. Slight enlargement of a periaortic lymph node, 8 mm as compared to 7 mm on the previous study. Increased  thickening of the right pleural rind. Moderate sized right pleural effusion. 7. Headaches. Negative brain CT 12/23/2014.    Disposition:  His overall status appears unchanged. I reviewed the chest x-ray with Mr. Noxon and his daughter. The right pleural effusion appears unchanged. He will return for an office visit and chest x-ray in 3 months.  He will be referred for a barium swallow to evaluate the dysphagia.  He has metastatic esophagus cancer. I consider him to be disabled secondary to this diagnosis.  Betsy Coder, MD  02/20/2015  1:54 PM

## 2015-02-20 NOTE — Telephone Encounter (Signed)
Gave avs & calendar for July °

## 2015-03-08 ENCOUNTER — Ambulatory Visit (HOSPITAL_COMMUNITY)
Admission: RE | Admit: 2015-03-08 | Discharge: 2015-03-08 | Disposition: A | Discharge status: Home / Self Care | Payer: Medicare Other | Source: Ambulatory Visit | Attending: Oncology | Admitting: Oncology

## 2015-03-08 DIAGNOSIS — C155 Malignant neoplasm of lower third of esophagus: Secondary | ICD-10-CM

## 2015-03-08 DIAGNOSIS — Z9889 Other specified postprocedural states: Secondary | ICD-10-CM | POA: Diagnosis not present

## 2015-03-08 DIAGNOSIS — R0602 Shortness of breath: Secondary | ICD-10-CM | POA: Diagnosis not present

## 2015-03-08 DIAGNOSIS — Z8501 Personal history of malignant neoplasm of esophagus: Secondary | ICD-10-CM | POA: Diagnosis not present

## 2015-03-09 ENCOUNTER — Telehealth: Payer: Self-pay | Admitting: *Deleted

## 2015-03-09 NOTE — Telephone Encounter (Signed)
-----   Message from Ladell Pier, MD sent at 03/08/2015  9:00 PM EDT ----- Please call daughter, rt. Effusion is stable, swallow study shows no obstruction, f/u as scheduled

## 2015-03-09 NOTE — Telephone Encounter (Signed)
Spoke with pt's daughter, informed her of stable R pleural effusion. Swallow study shows no obstruction. She voiced understanding and appreciation for call. Encouraged her to call office as needed, follow up as scheduled.

## 2015-03-18 IMAGING — CR DG CHEST 2V
2 series · 2 of 2 positions shown · non-contrast
Comparison: Chest radiograph 07/19/2014 and CT chest 07/04/2014

CLINICAL DATA: Metastatic esophageal cancer. Evaluate for right
pleural effusion

EXAM:
CHEST  2 VIEW

[w chest pa]
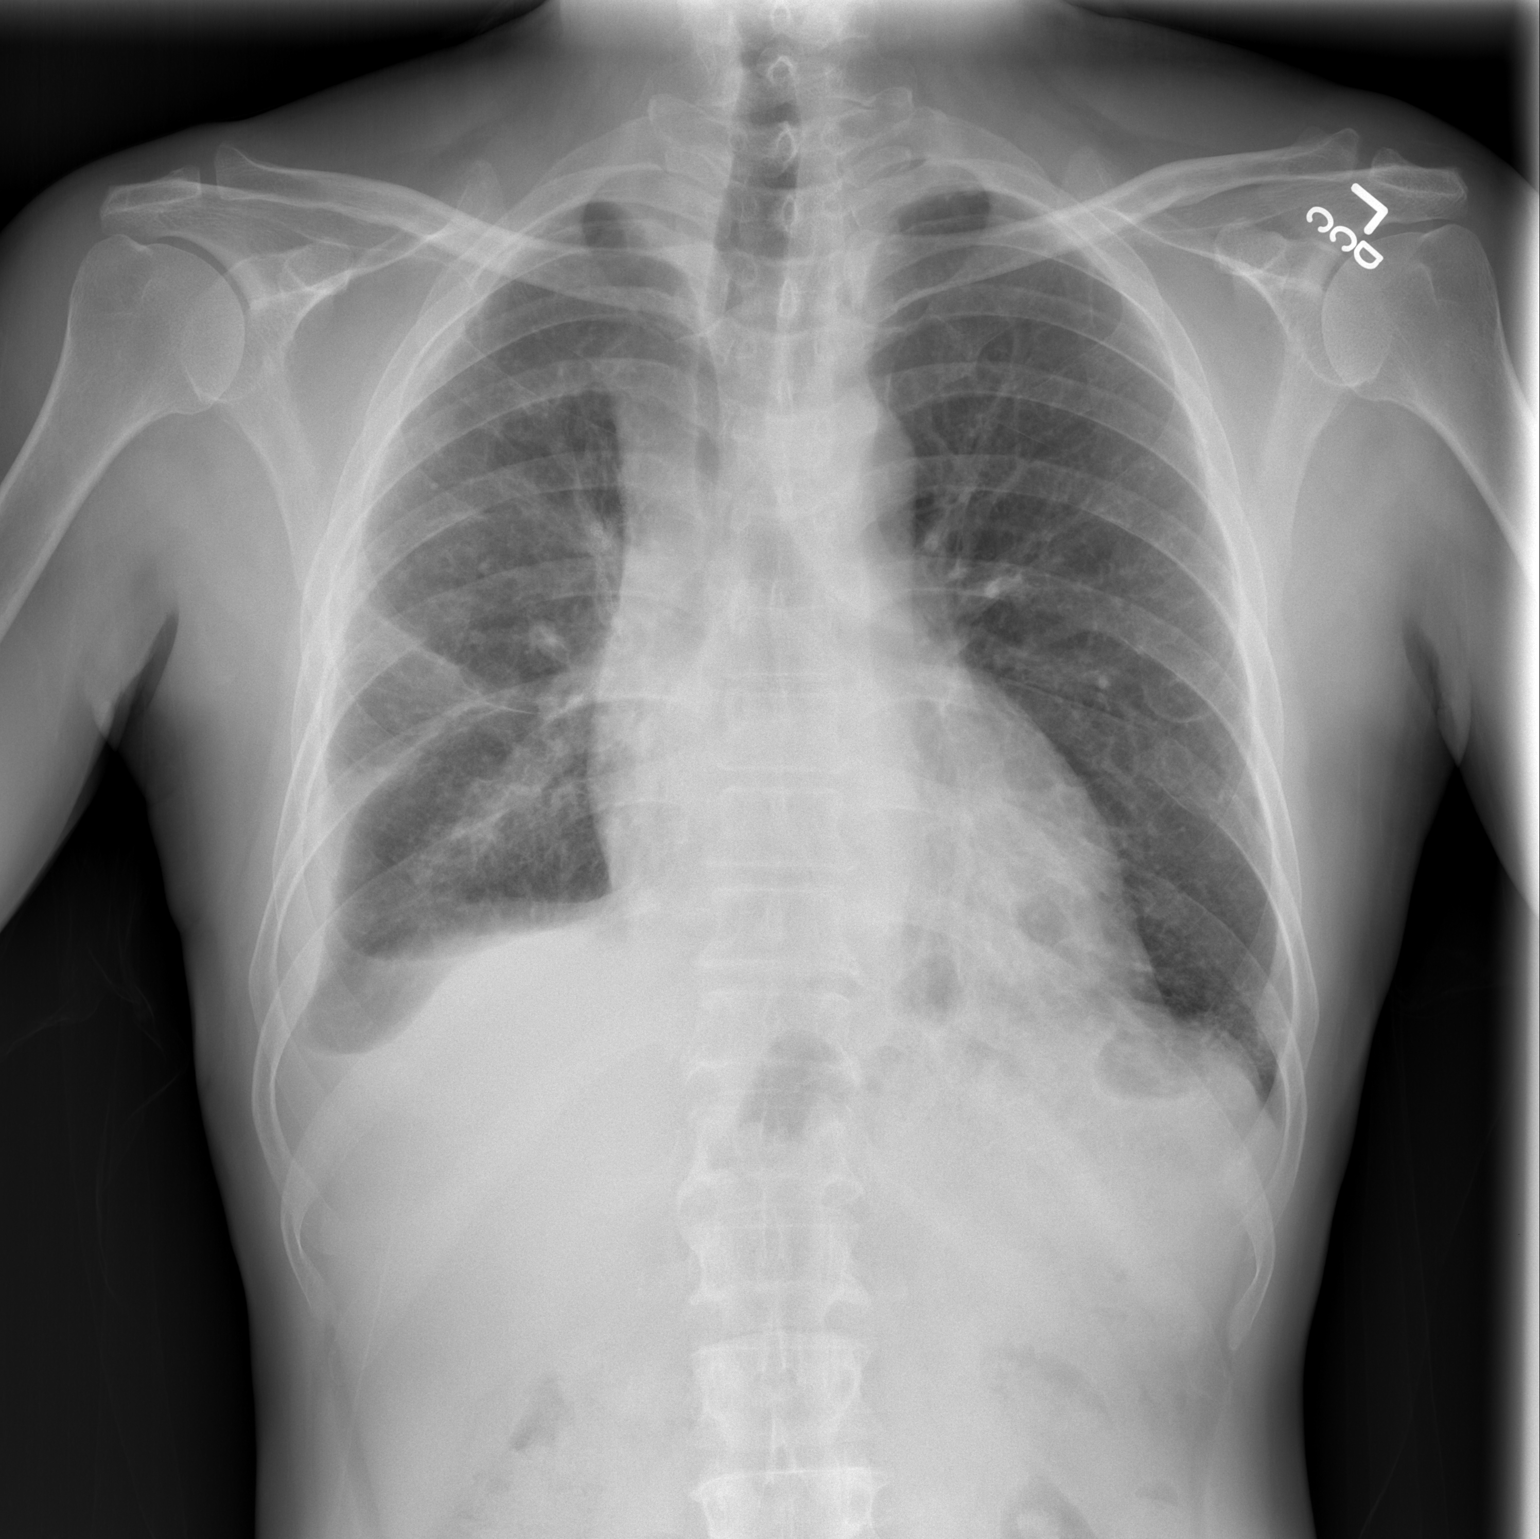

[w chest lat]
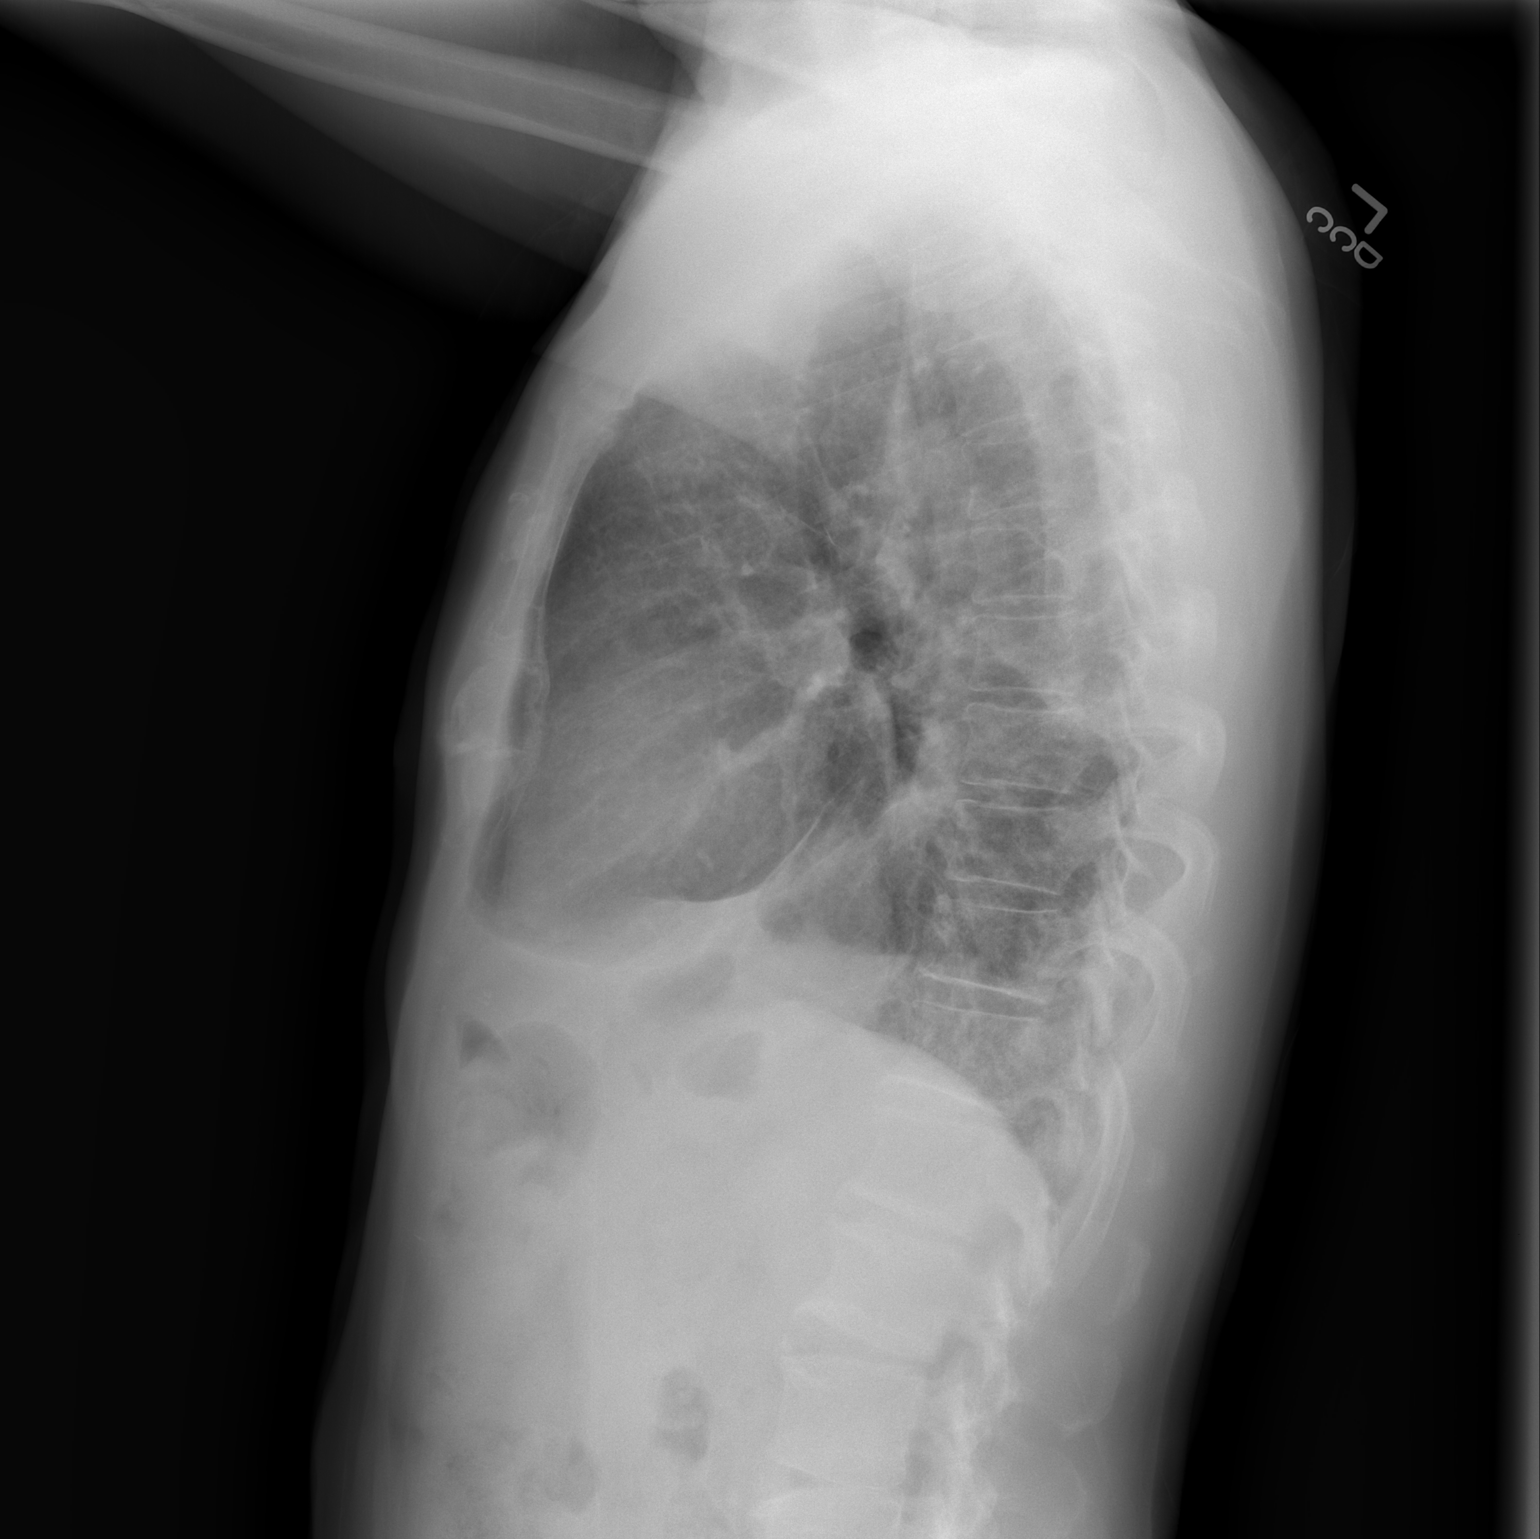

[2 of 2 positions shown; findings below may reference images not displayed]

FINDINGS: Stable heart and mediastinal contours. Small right pleural effusion
is without significant change compared chest radiograph of
07/19/2014. Stable small volume of pleural fluid or pleural
thickening over the right lung apex. Persistent linear opacities in
the right midlung. Mild left basilar atelectasis. Slight chronic
prominence of the interstitial markings of the lung bases. No acute
osseous abnormality identified.
IMPRESSION: No significant change in small right pleural effusion.

Chronic mild bibasilar interstitial prominence and no significant
change in linear opacities in the right midlung. Left basilar
atelectasis.

## 2015-05-25 ENCOUNTER — Ambulatory Visit (HOSPITAL_COMMUNITY)
Admission: RE | Admit: 2015-05-25 | Discharge: 2015-05-25 | Disposition: A | Discharge status: Home / Self Care | Payer: Medicare Other | Source: Ambulatory Visit | Attending: Oncology | Admitting: Oncology

## 2015-05-25 ENCOUNTER — Ambulatory Visit (HOSPITAL_BASED_OUTPATIENT_CLINIC_OR_DEPARTMENT_OTHER): Payer: Medicare Other | Admitting: Oncology

## 2015-05-25 ENCOUNTER — Telehealth: Payer: Self-pay | Admitting: Oncology

## 2015-05-25 VITALS — BP 114/67 | HR 65 | Temp 98.7°F | Resp 20 | Ht 63.0 in | Wt 119.9 lb

## 2015-05-25 DIAGNOSIS — C7800 Secondary malignant neoplasm of unspecified lung: Secondary | ICD-10-CM | POA: Insufficient documentation

## 2015-05-25 DIAGNOSIS — J90 Pleural effusion, not elsewhere classified: Secondary | ICD-10-CM

## 2015-05-25 DIAGNOSIS — C7801 Secondary malignant neoplasm of right lung: Secondary | ICD-10-CM | POA: Diagnosis not present

## 2015-05-25 DIAGNOSIS — C155 Malignant neoplasm of lower third of esophagus: Secondary | ICD-10-CM | POA: Diagnosis not present

## 2015-05-25 NOTE — Patient Instructions (Signed)
Try benadryl 25 mg at bedtime for sleep as needed

## 2015-05-25 NOTE — Telephone Encounter (Signed)
Pt confirmed MD visit per 07/07 POF, gave pt AVS and Calendar.... Bruce [name]

## 2015-05-25 NOTE — Progress Notes (Addendum)
[facility] OFFICE PROGRESS NOTE   Diagnosis:  Esophagus cancer  INTERVAL HISTORY:   He returns as scheduled. He overall feels well. He has a good appetite. After eating he notes that he feels tired for about 20 minutes. No dysphagia. No nausea or vomiting. Rare shortness of breath. No cough or fever. He notes some difficulty sleeping.  Objective:  Vital signs in last 24 hours:  Blood pressure 114/67, pulse 65, temperature 98.7 F (37.1 C), temperature source Oral, resp. rate 20, height '5\' 3"'$  (1.6 m), weight 119 lb 14.4 oz (54.386 kg), SpO2 100 %.    HEENT: No thrush or ulcers. Lymphatics: No palpable cervical, supra clavicular or axillary lymph nodes. Resp: Lungs clear with diminished breath sounds at the right base. No respiratory distress. Cardio: Regular rate and rhythm. GI: Abdomen soft and nontender. No hepatomegaly. No mass. Vascular: No leg edema.  Lab Results:  Lab Results  Component Value Date   WBC 8.9 07/29/2014   HGB 12.8* 07/29/2014   HCT 39.9 07/29/2014   MCV 85.5 07/29/2014   PLT 321 07/29/2014   NEUTROABS 5.9 07/29/2014    Imaging:  No results found.  Medications: I have reviewed the patient's current medications.  Assessment/Plan: 1. Squamous cell carcinoma. Staging CT scans of the chest, abdomen and pelvis on 06/02/2012 negative for evidence of metastatic disease. Endoscopic ultrasound 06/11/2012 confirmed a uT3 uN1 tumor at 40 cm from the incisors. He began radiation 06/15/2012. He began weekly Taxol/carboplatin chemotherapy 06/16/2012. He completed 6 weekly chemotherapy treatments. The carboplatin was held with week 5 due to thrombocytopenia. He completed the sixth weekly treatment with Taxol and carboplatin on 07/23/2012. He completed radiation on 07/29/2012 -he underwent an esophagectomy on 08/26/2012 with the pathology confirming a ypT3,ypN0 squamous cell carcinoma with perineural invasion identified.  2. History of solid/liquid  dysphagia secondary to the obstructing esophagus mass.  3. Reflux. He is taking Protonix. 4. Hospitalization 07/19/2014 with abdominal pain and vomiting. CT scan showed a mid small bowel obstruction without discrete mass or evident etiology. Small bowel obstruction felt to be secondary to adhesions. 5. Renal failure during the 07/19/2014 hospitalization felt to be contrast-induced nephropathy. 6. Chest CT 06/15/2013 with postoperative changes from esophagectomy and gastric pull-through. 7 mm posterior subpleural nodule in the right lower lobe. Followup CT scan 12/16/2013 revealed an increase in the size of a right hilar node and right upper lobe nodule.  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  Chest CT 12/23/2014 with enlarged right hilar lymph node measuring 1.2 cm, previously 0.8 cm. Slight enlargement of a periaortic lymph node, 8 mm as compared to 7 mm on the previous study. Increased thickening of the right pleural rind. Moderate sized right pleural effusion. 7. Headaches. Negative brain CT 12/23/2014.   Disposition: Mr. Hun appears stable. Initial review of the chest x-ray shows the right-sided pleural effusion is stable. We will follow-up on the final report.  He will return for a follow-up visit and chest x-ray in 3 months. For the  difficulty sleeping we recommended he try Benadryl 25 mg at bedtime as needed.  Patient seen with Dr. Benay Spice.    Ned Card ANP/GNP-BC   05/25/2015  3:17 PM  This was a shared visit with Ned Card. The right pleural effusion is unchanged. There is no clinical evidence of disease progression. Mr.Manzo will return for an office visit and chest x-ray in 3 months.  Julieanne Manson, M.D.

## 2015-06-22 ENCOUNTER — Encounter: Payer: Self-pay | Admitting: Cardiothoracic Surgery

## 2015-06-22 ENCOUNTER — Ambulatory Visit (INDEPENDENT_AMBULATORY_CARE_PROVIDER_SITE_OTHER): Payer: Medicare Other | Admitting: Cardiothoracic Surgery

## 2015-06-22 VITALS — BP 110/73 | HR 96 | Resp 20 | Ht 63.0 in | Wt 122.0 lb

## 2015-06-22 DIAGNOSIS — Z9049 Acquired absence of other specified parts of digestive tract: Secondary | ICD-10-CM

## 2015-06-22 DIAGNOSIS — Z9889 Other specified postprocedural states: Secondary | ICD-10-CM

## 2015-06-22 DIAGNOSIS — C159 Malignant neoplasm of esophagus, unspecified: Secondary | ICD-10-CM

## 2015-06-22 NOTE — Progress Notes (Signed)
[facility] Record #[id number] Date of Birth: Apr 27, 1961  Referring: Ladell Pier, MD Primary Care: No PCP Per Patient  Chief Complaint:    Chief Complaint  Patient presents with  . Esophageal Cancer    6 month f/u    History of Present Illness:    Bruce [name] 54 y.o. 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 On followup scans the patient was noted to have an increasing right lung lesion.  The patient underwent  wedge resection of right lung lesion and node dissection, final pathology is suggestive of squamous cell from known metastatic from esophageal primary in march 2015 .  He is been followed by oncology, recently had a chest x-ray He has been able to maintain his weight stable. Does have some abdominal discomfort and early satiety, if he takes excessive amount of milk products he does have dumping syndrome.  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   Zubrod Score: At the time of surgery this patient's most appropriate activity status/level should be described as: '[]'$     0    Normal activity, no symptoms '[x]'$     1    Restricted in physical strenuous activity but ambulatory, able to do out light work '[]'$     2     Ambulatory and capable of self care, unable to do work activities, up and about               >50 % of waking hours                              '[]'$     3    Only limited self care, in bed greater than 50% of waking hours '[]'$     4    Completely disabled, no self care, confined to bed or chair '[]'$     5    Moribund   Past Medical History  Diagnosis Date  . Esophagitis   . Mass of esophagus 05/28/2012    BX'D DISTAL ESOPHAGUS,PENDING  . Dysphasia     solid and liquid  . Neuropathy     right hand numbness 1 year 2012  . 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 Lung cancer 12/27/13    right upper lung    Past Surgical History  Procedure Laterality Date  . 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 Eus  06/11/2012    Procedure: UPPER ENDOSCOPIC ULTRASOUND (EUS) RADIAL;  Surgeon: Milus Banister, MD;  Location: [facility];  Service: Endoscopy;  Laterality: N/A;  . Complete esophagectomy  08/26/2012  Procedure: ESOPHAGECTOMY COMPLETE;  Surgeon: [name], MD;  Location: [facility];  Service: Thoracic;  Laterality: N/A;  transhiatal   . Pyloroplasty  08/26/2012    Procedure: PYLOROPLASTY;  Surgeon: [name], MD;  Location: [facility];  Service: Thoracic;  Laterality: N/A;  . Video bronchoscopy  08/26/2012    Procedure: VIDEO BRONCHOSCOPY;  Surgeon: [name], MD;  Location: [facility];  Service: Thoracic;  Laterality: N/A;  . Jejunostomy  08/26/2012    Procedure: JEJUNOSTOMY;  Surgeon: [name], MD;  Location: [facility];  Service: Thoracic;  Laterality: N/A;  placement of feeding jujunostomy tube  . Video bronchoscopy N/A 01/31/2014    Procedure: VIDEO BRONCHOSCOPY;  Surgeon: [name], MD;  Location: [facility] OR;  Service: Thoracic;  Laterality: N/A;  . Video assisted thoracoscopy (vats)/wedge resection Right 01/31/2014    Procedure: VIDEO ASSISTED THORACOSCOPY (VATS)/WEDGE  RESECTION; with insertion of On Q pain pump;  Surgeon: [name], MD;  Location: [facility];  Service: Thoracic;  Laterality: Right;  with insertion of On Q pain pump  . Lymph node dissection Right 01/31/2014    Procedure: LYMPH NODE DISSECTION;  Surgeon: [name], MD;  Location: [facility] OR;  Service: Thoracic;  Laterality: Right;    Family History  Problem Relation Age of Onset  . Breast cancer Sister     History   Social History  . Marital Status: Married    Spouse Name: N/A    Number of Children: N/A  . Years of Education: N/A   Occupational History  . welder    Social History Main Topics  . Smoking status: Former Smoker -- 0.40 packs/day for 25 years    Types: Cigarettes    Quit date: 06/19/2011  . Smokeless tobacco: Former Systems developer    Quit date: 06/03/2007  . Alcohol Use: No     Comment: not currently (01/28/14)  . Drug Use: No  . Sexual Activity: Not on file      History  Smoking status  . Former Smoker -- 0.40 packs/day for 25 years  . Types: Cigarettes  . Quit date: 06/19/2011  Smokeless tobacco  . Former Systems developer  . Quit date: 06/03/2007    History  Alcohol Use No    Comment: not currently (01/28/14)     No Known Allergies  Current Outpatient Prescriptions  Medication Sig Dispense Refill  . pantoprazole (PROTONIX) 40 MG tablet TAKE 1 TABLET BY MOUTH DAILY 30 tablet 5   No current facility-administered medications for this visit.     Review of Systems:     Cardiac Review of Systems: Y or N  Chest Pain [ n   ]  Resting SOB [ n  ] Exertional SOB  [n  ]  Orthopnea [ n ]   Pedal Edema [ n  ]    Palpitations [ n ] Syncope  [ n ]   Presyncope [  n ]  General Review of Systems: [Y] = yes [  ]=no Constitional: recent weight change [n  ];  Wt loss over the last 3 months [   ] anorexia [  ]; fatigue Blue.Reese  ]; nausea [ n ]; night sweats [ n ]; fever [ n ]; or chills [n  ];          Dental: poor dentition[n  ]; Last Dentist visit:   Eye : blurred vision [  ];  diplopia [   ]; vision changes [  ];  Amaurosis fugax[  ]; Resp: cough [  ];  wheezing[n  ];  hemoptysis[ n ]; shortness of breath[ n ]; paroxysmal nocturnal dyspnea[ n ]; dyspnea on exertion[ n ]; or orthopnea[  ];  GI:  gallstones[  ], vomiting[ n ];  dysphagia[  ]; melena[ n ];  hematochezia [n  ]; heartburn[ y ];   Hx of  Colonoscopy[  ]; GU: kidney stones [  ]; hematuria[  ];   dysuria [n  ];  nocturia[  ];  history of     obstruction [ n ]; urinary frequency [ n ]             Skin: rash, swelling[  ];, hair loss[  ];  peripheral edema[  ];  or itching[  ]; Musculosketetal: myalgias[  ];  joint swelling[  ];  joint erythema[  ];  joint pain[  ];  back pain[  ];  Heme/Lymph: bruising[  ];  bleeding[n  ];  anemia[  ];  Neuro: TIA[  ];  headaches[  ];  stroke[ n ];  vertigo[  ];  seizures[  ];   paresthesias[  ];  difficulty walking[n  ];  Psych:depression[  ]; anxiety[  ];  Endocrine: diabetes[ n ];  thyroid dysfunction[n  ];  Immunizations: Flu up to date Blue.Reese  ]; Pneumococcal up to date [  y];  Other:  Physical Exam: BP 110/73 mmHg  Pulse 96  Resp 20  Ht '5\' 3"'$  (1.6 m)  Wt 122 lb (55.339 kg)  BMI 21.62 kg/m2  SpO2 98%   PHYSICAL EXAMINATION:  Wt Readings from Last 3 Encounters:  06/22/15 122 lb (55.339 kg)  05/25/15 119 lb 14.4 oz (54.386 kg)  02/20/15 120 lb 12.8 oz (54.795 kg)   General appearance: alert, cooperative, appears stated age and no distress Neurologic: intact Heart: regular rate and rhythm, S1, S2 normal, no murmur, click, rub or gallop Lungs: clear to auscultation bilaterally Abdomen: soft, non-tender; bowel sounds normal; no masses,  no organomegaly abdominal incisions well-healed without incisional hernia Extremities: extremities normal, atraumatic, no cyanosis or edema and Homans sign is negative, no sign of DVT Wound: Patient's incisions in the left neck and mid abdomen are well-healed, The right chest incision is well-healed without evidence of  infection Patient has no cervical or supraclavicular or axillary adenopathy.  Diagnostic Studies & Laboratory data:     Recent Radiology Findings: Dg Chest 2 View  05/25/2015   CLINICAL DATA:  History of lung cancer  EXAM: CHEST  2 VIEW  COMPARISON:  03/08/2015  FINDINGS: Cardiomediastinal silhouette is stable. Again noted chronic mild interstitial prominence. Stable small loculated right pleural effusion with right basilar atelectasis or scarring. Bony thorax is stable.  IMPRESSION: Stable chronic interstitial prominence. Again noted loculated right pleural effusion with right basilar atelectasis or scarring. No significant change.   Electronically Signed   By: Lahoma Crocker M.D.   On: 05/25/2015 15:41   I have independently reviewed the above radiology studies  and reviewed the findings with the patient and his daughter.   Nm Pet Image Restag (ps) Skull Base To Thigh  01/11/2014   CLINICAL DATA:  Subsequent treatment strategy for esophageal cancer. Recent biopsy of a cavitary right upper lobe lung lesion revealed invasive squamous cell carcinoma.  EXAM: NUCLEAR MEDICINE PET SKULL BASE TO THIGH  FASTING BLOOD GLUCOSE:  Value: 92 mg/dl  TECHNIQUE: 6.3 mCi F-18 FDG was injected intravenously. Full-ring PET imaging was performed from the skull base to thigh  after the radiotracer. CT data was obtained and used for attenuation correction and anatomic localization.  COMPARISON:  CT BIOPSY dated 12/27/2013  FINDINGS: NECK  No hypermetabolic lymph nodes in the neck.  CHEST  Gastric pull-through noted. Cavitary posterior right upper lobe mass, 2.1 x 1.5 cm, maximum standard uptake value 7.7. Posterior left infrahilar lymph node, 2.3 x 2.0 cm, maximum standard uptake value 9.8. Adjacent indistinct infrahilar node has maximum standard uptake value of 5.9.  A vascular structure or small lymph node posterior to the descending thoracic aorta has a maximum standard uptake value of 3.4 and is probably incidental.   ABDOMEN/PELVIS  Mid transverse colon has some faintly increased signal but is also small in caliber, unless this increased signal (standard uptake value 6.1) is probably physiologic. Mildly prominent but gas-filled appendix does not appear actively inflamed. It is  SKELETON  No focal hypermetabolic activity to suggest skeletal metastasis.  IMPRESSION: 1. Hypermetabolic cavitary right upper lobe mass corresponding to the known invasive squamous cell carcinoma. Pathologic right infrahilar adenopathy. 2. Faintly increased activity in the mid transverse colon is probably physiologic.   Electronically Signed   By: Sherryl Barters M.D.   On: 01/11/2014 11:46    Recent Lab Findings: Lab Results  Component Value Date   WBC 8.9 07/29/2014   HGB 12.8* 07/29/2014   HCT 39.9 07/29/2014   PLT 321 07/29/2014   GLUCOSE 247* 12/26/2014   ALT 13 12/26/2014   AST 18 12/26/2014   NA 139 12/26/2014   K 4.3 12/26/2014   CL 100 07/26/2014   CREATININE 1.3 12/26/2014   BUN 13.2 12/26/2014   CO2 22 12/26/2014   TSH 0.853 07/20/2014   INR 1.13 07/21/2014   HGBA1C 6.2* 07/20/2014      Assessment / Plan:   Advanced stage esophageal cancer, currently under observation only. Patient is maintained his nutritional status adequately   Small right pleural effusion appear stable  I plan to see back in 6 months or at Dr. Benay Spice as requested anytime.   [name] MD      [address] [place] [address] Office [phone]   Beeper [phone]  06/22/2015 10:22 AM

## 2015-08-28 ENCOUNTER — Ambulatory Visit (HOSPITAL_COMMUNITY)
Admission: RE | Admit: 2015-08-28 | Discharge: 2015-08-28 | Disposition: A | Discharge status: Home / Self Care | Payer: Medicare Other | Source: Ambulatory Visit | Attending: Oncology | Admitting: Oncology

## 2015-08-28 ENCOUNTER — Ambulatory Visit (HOSPITAL_BASED_OUTPATIENT_CLINIC_OR_DEPARTMENT_OTHER): Payer: Medicare Other | Admitting: Oncology

## 2015-08-28 ENCOUNTER — Telehealth: Payer: Self-pay | Admitting: Oncology

## 2015-08-28 VITALS — BP 113/70 | HR 58 | Temp 98.1°F | Resp 18 | Ht 63.0 in | Wt 121.8 lb

## 2015-08-28 DIAGNOSIS — R0602 Shortness of breath: Secondary | ICD-10-CM | POA: Insufficient documentation

## 2015-08-28 DIAGNOSIS — C779 Secondary and unspecified malignant neoplasm of lymph node, unspecified: Secondary | ICD-10-CM | POA: Diagnosis not present

## 2015-08-28 DIAGNOSIS — C155 Malignant neoplasm of lower third of esophagus: Secondary | ICD-10-CM

## 2015-08-28 DIAGNOSIS — Z85118 Personal history of other malignant neoplasm of bronchus and lung: Secondary | ICD-10-CM | POA: Diagnosis not present

## 2015-08-28 DIAGNOSIS — J90 Pleural effusion, not elsewhere classified: Secondary | ICD-10-CM | POA: Diagnosis not present

## 2015-08-28 DIAGNOSIS — C7801 Secondary malignant neoplasm of right lung: Secondary | ICD-10-CM | POA: Diagnosis not present

## 2015-08-28 NOTE — Progress Notes (Signed)
  [place] OFFICE PROGRESS NOTE   Diagnosis: Esophagus cancer  INTERVAL HISTORY:   [name] returns as scheduled. He is accompanied by his daughter. He reports frequent headaches lasting for 30 minutes after eating. He also has loose stool after eating. No other complaint. The headache resolved spontaneously.  Objective:  Vital signs in last 24 hours:  Blood pressure 113/70, pulse 58, temperature 98.1 F (36.7 C), temperature source Oral, resp. rate 18, height '5\' 3"'$  (1.6 m), weight 121 lb 12.8 oz (55.248 kg), SpO2 100 %.    HEENT: Neck without mass, no sinus tenderness Lymphatics: No cervical, supraclavicular, or axillary nodes Resp: Lungs clear bilaterally Cardio: Regular rate and rhythm GI: No hepatomegaly, no mass Vascular: No leg edema Neuro: The motor exam appears intact in the upper and lower extremities     Medications: I have reviewed the patient's current medications.  Assessment/Plan: 1. Squamous cell carcinoma. Staging CT scans of the chest, abdomen and pelvis on 06/02/2012 negative for evidence of metastatic disease. Endoscopic ultrasound 06/11/2012 confirmed a uT3 uN1 tumor at 40 cm from the incisors. He began radiation 06/15/2012. He began weekly Taxol/carboplatin chemotherapy 06/16/2012. He completed 6 weekly chemotherapy treatments. The carboplatin was held with week 5 due to thrombocytopenia. He completed the sixth weekly treatment with Taxol and carboplatin on 07/23/2012. He completed radiation on 07/29/2012 -he underwent an esophagectomy on 08/26/2012 with the pathology confirming a ypT3,ypN0 squamous cell carcinoma with perineural invasion identified.  2. History of solid/liquid dysphagia secondary to the obstructing esophagus mass.  3. Reflux. He is taking Protonix. 4. Hospitalization 07/19/2014 with abdominal pain and vomiting. CT scan showed a mid small bowel obstruction without discrete mass or evident etiology. Small bowel obstruction felt  to be secondary to adhesions. 5. Renal failure during the 07/19/2014 hospitalization felt to be contrast-induced nephropathy. 6. Chest CT 06/15/2013 with postoperative changes from esophagectomy and gastric pull-through. 7 mm posterior subpleural nodule in the right lower lobe. Followup CT scan 12/16/2013 revealed an increase in the size of a right hilar node and right upper lobe nodule.  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  Chest CT 12/23/2014 with enlarged right hilar lymph node measuring 1.2 cm, previously 0.8 cm. Slight enlargement of a periaortic lymph node, 8 mm as compared to 7 mm on the previous study. Increased thickening of the right pleural rind. Moderate sized right pleural effusion. 7. Headaches. Negative brain CT 12/23/2014.    Disposition:  There is no clinical evidence for progression of the esophagus cancer. He will be referred for a chest x-ray as he leaves the clinic today. [name] will return for an office visit and chest x-ray in 3 months. I doubt the headaches are related to esophagus cancer. His daughter will contact us if these become consistent or he develops neurologic symptoms.  Betsy Coder, MD  08/28/2015  12:49 PM

## 2015-08-28 NOTE — Telephone Encounter (Signed)
Gave patient/relaive avs report and appointments for January 2017. Patient/relative aware patient will have cxr same day as f/u 11/28/15.

## 2015-08-30 ENCOUNTER — Telehealth: Payer: Self-pay | Admitting: *Deleted

## 2015-08-30 NOTE — Telephone Encounter (Signed)
Per Dr. Benay Spice; left voice message for pt's daughter Maree Erie that cxr is unchanged, f/u as scheduled; call if any questions.

## 2015-08-30 NOTE — Telephone Encounter (Signed)
-----   Message from Ladell Pier, MD sent at 08/28/2015  8:55 PM EDT ----- Please call daughter, cxr is unchanged, f/u as scheduled

## 2015-09-09 IMAGING — CR DG CHEST 2V
2 series · 2 of 2 positions shown · non-contrast
Comparison: Multiple prior chest x-rays and a chest CT from
12/23/2014

CLINICAL DATA: Shortness of breath. History of esophageal cancer.
Followup right pleural effusion.

EXAM:
CHEST  2 VIEW

[w chest pa]
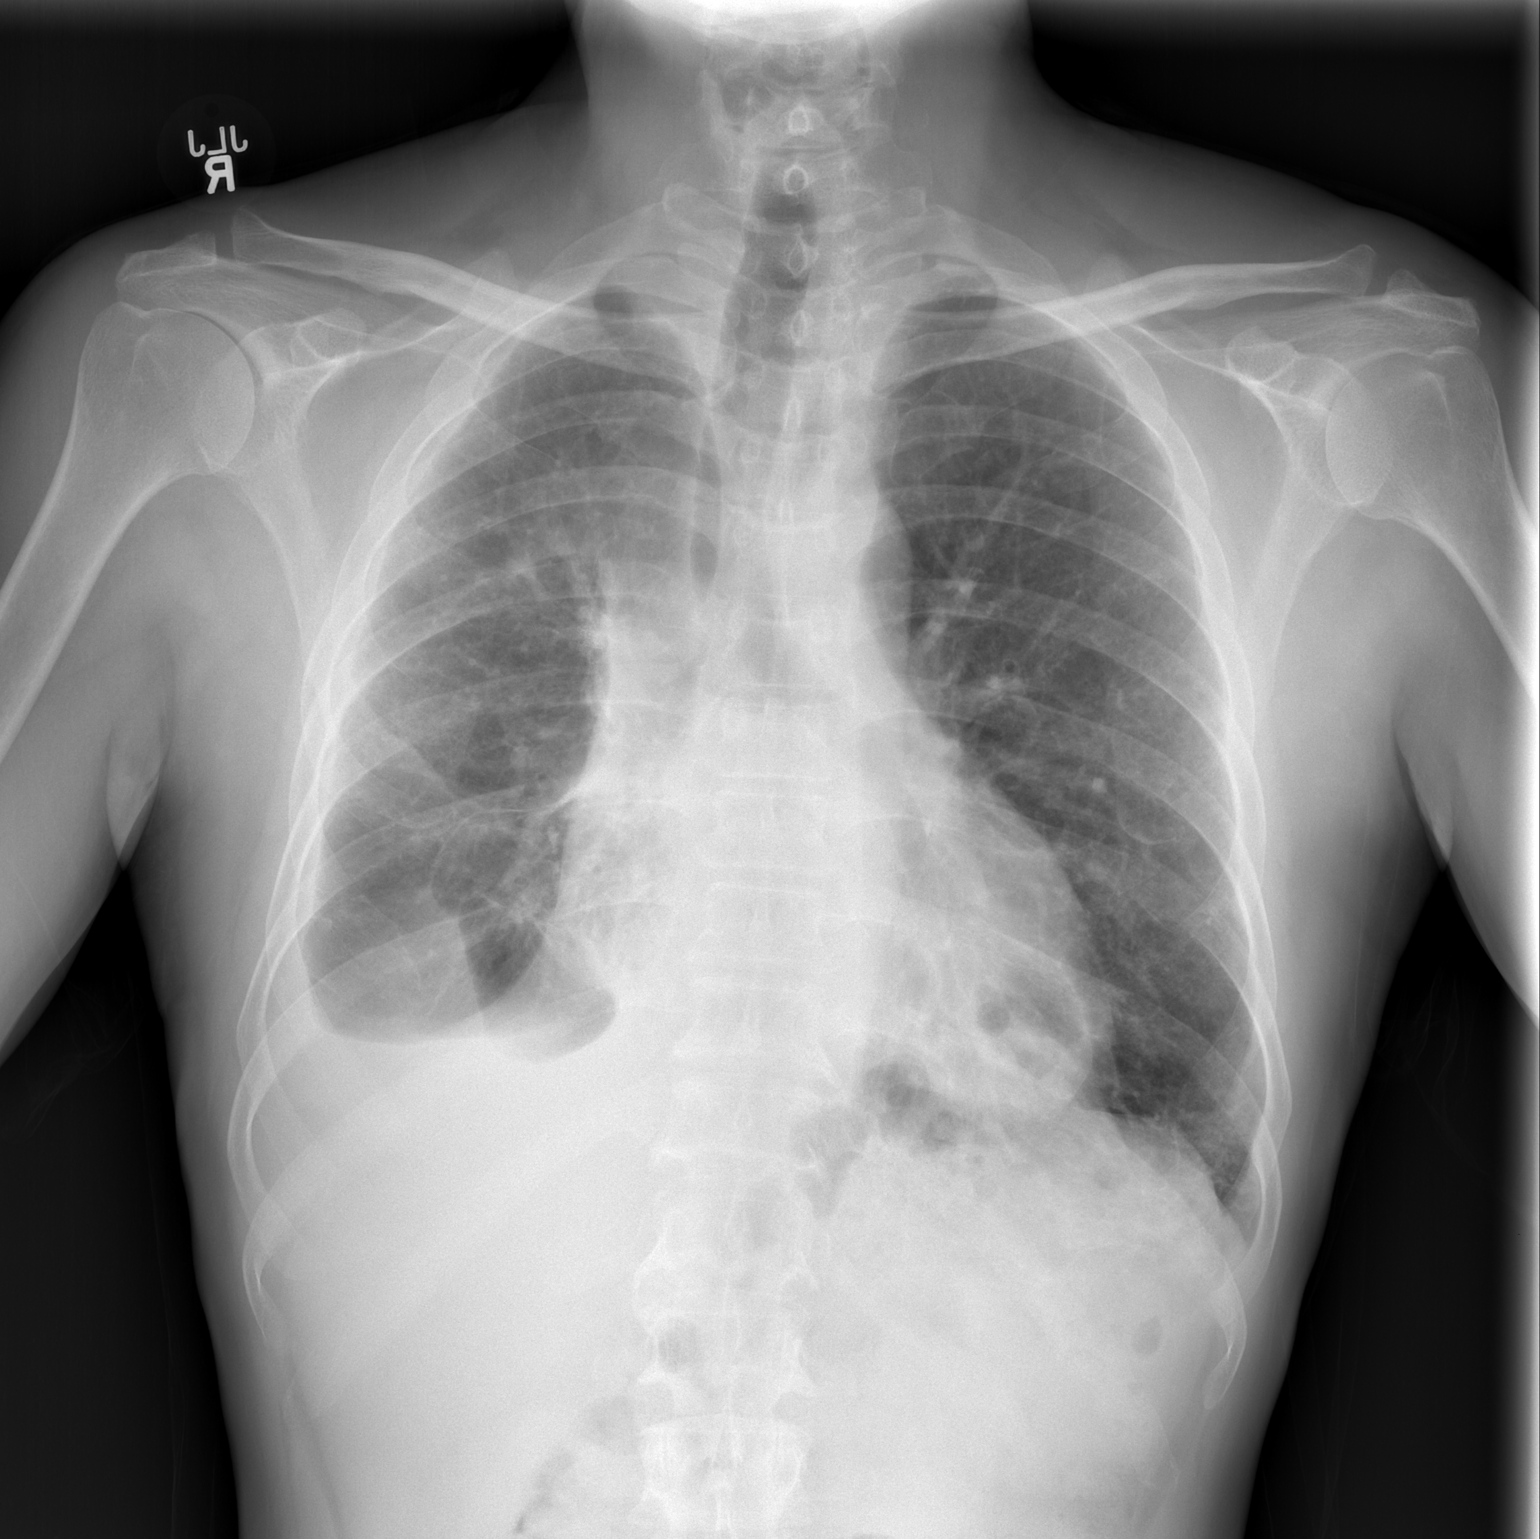

[w chest lat]
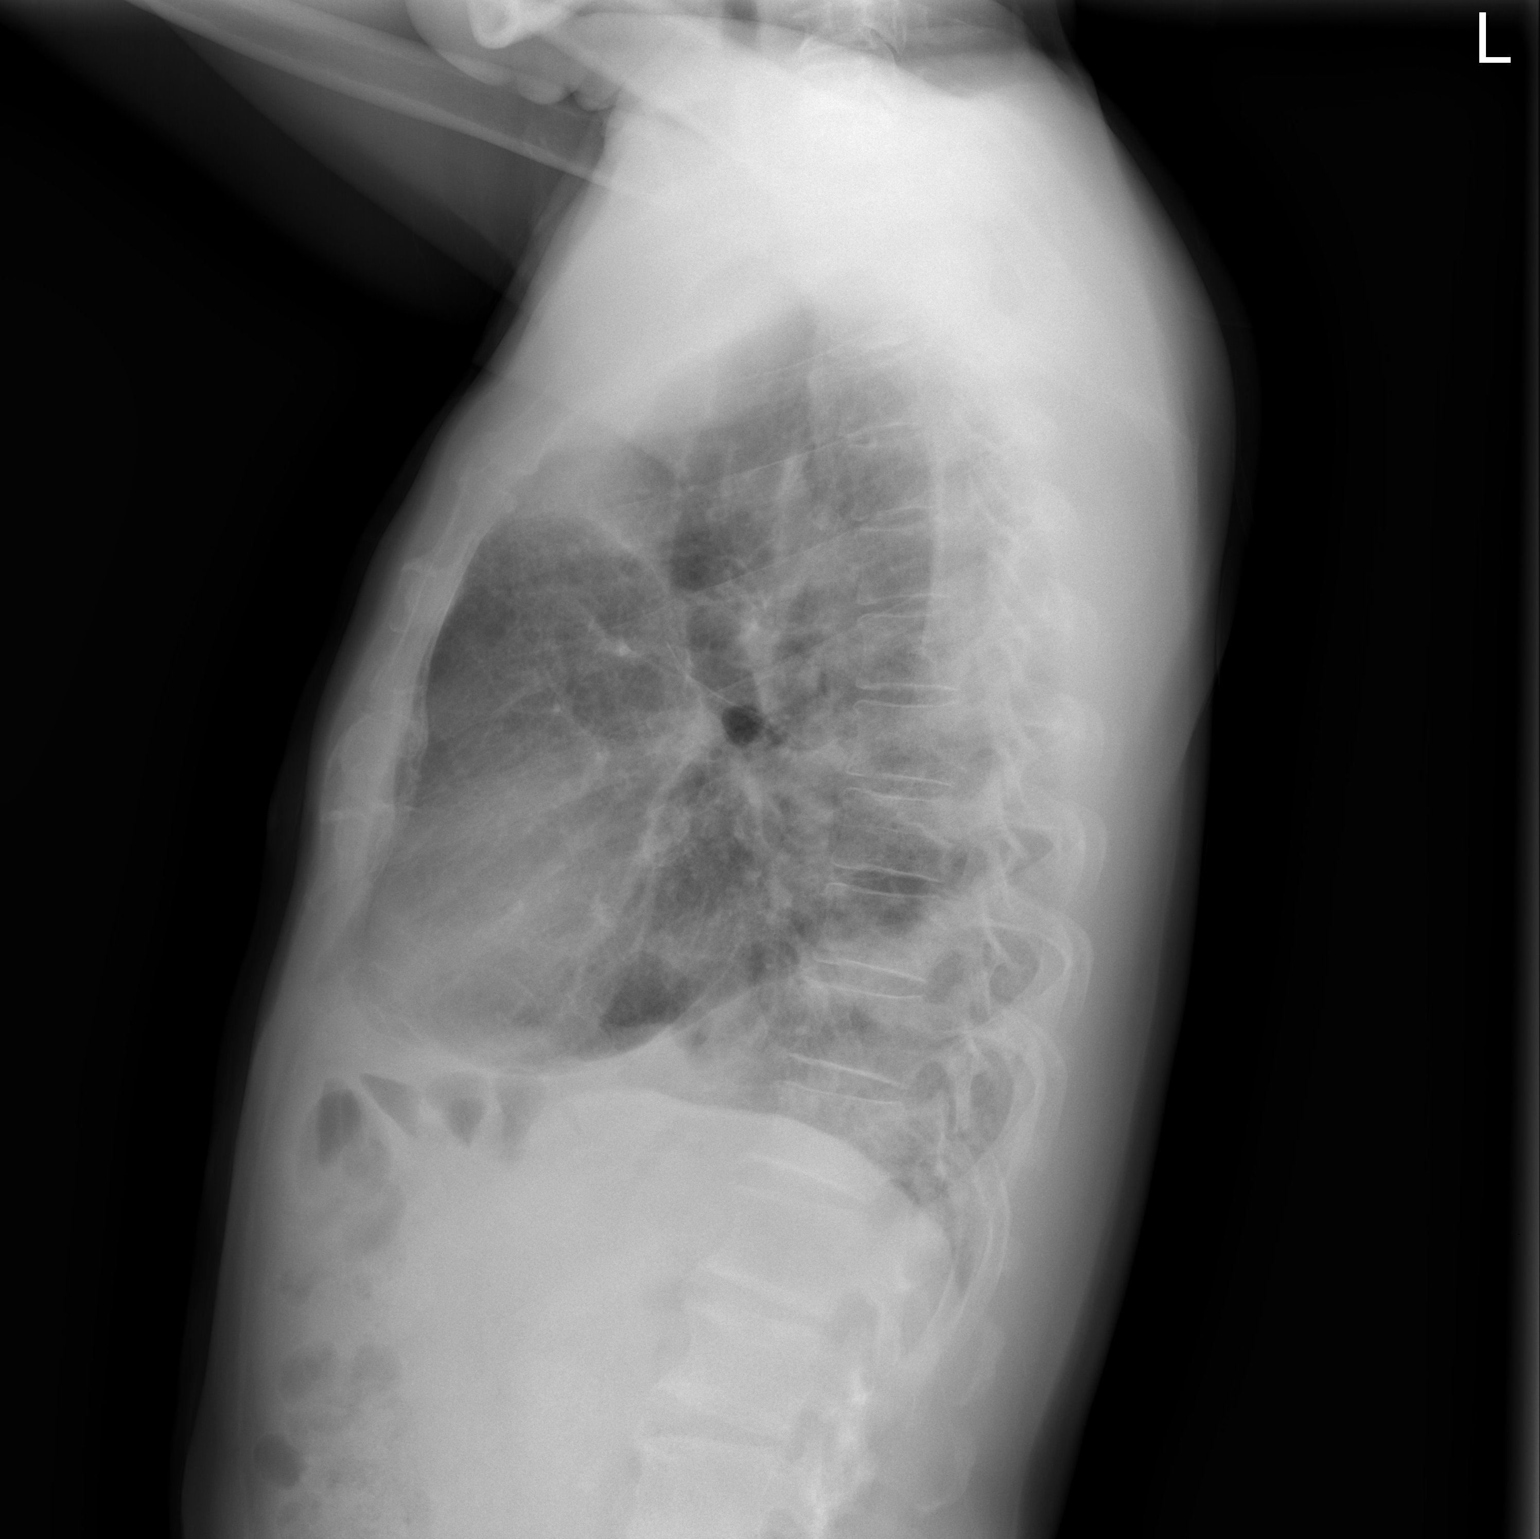

[2 of 2 positions shown; findings below may reference images not displayed]

FINDINGS: The cardiac silhouette, mediastinal hilar contours are within normal
limits and stable. Stable surgical changes from esophagectomy and
gastric pull-through. There is a persistent right-sided pleural
effusion and scarring changes in both lungs. No definite new/acute
pulmonary findings. The bony thorax is intact.
IMPRESSION: Chronic postoperative changes and chronic lung disease.

Stable right-sided pleural effusion.

## 2015-11-28 ENCOUNTER — Ambulatory Visit (HOSPITAL_BASED_OUTPATIENT_CLINIC_OR_DEPARTMENT_OTHER): Payer: Medicare Other | Admitting: Oncology

## 2015-11-28 ENCOUNTER — Ambulatory Visit (HOSPITAL_COMMUNITY)
Admission: RE | Admit: 2015-11-28 | Discharge: 2015-11-28 | Disposition: A | Discharge status: Home / Self Care | Payer: Medicare Other | Source: Ambulatory Visit | Attending: Nurse Practitioner | Admitting: Nurse Practitioner

## 2015-11-28 ENCOUNTER — Other Ambulatory Visit: Payer: Self-pay | Admitting: *Deleted

## 2015-11-28 VITALS — BP 113/68 | HR 72 | Temp 98.1°F | Resp 18 | Ht 63.0 in | Wt 118.6 lb

## 2015-11-28 DIAGNOSIS — R05 Cough: Secondary | ICD-10-CM

## 2015-11-28 DIAGNOSIS — C779 Secondary and unspecified malignant neoplasm of lymph node, unspecified: Secondary | ICD-10-CM | POA: Diagnosis not present

## 2015-11-28 DIAGNOSIS — C7800 Secondary malignant neoplasm of unspecified lung: Secondary | ICD-10-CM | POA: Diagnosis not present

## 2015-11-28 DIAGNOSIS — C7801 Secondary malignant neoplasm of right lung: Secondary | ICD-10-CM | POA: Diagnosis not present

## 2015-11-28 DIAGNOSIS — R0989 Other specified symptoms and signs involving the circulatory and respiratory systems: Secondary | ICD-10-CM | POA: Diagnosis not present

## 2015-11-28 DIAGNOSIS — C155 Malignant neoplasm of lower third of esophagus: Secondary | ICD-10-CM

## 2015-11-28 DIAGNOSIS — J90 Pleural effusion, not elsewhere classified: Secondary | ICD-10-CM

## 2015-11-28 MED ORDER — PANTOPRAZOLE SODIUM 40 MG PO TBEC: 40.0000 mg | DELAYED_RELEASE_TABLET | Freq: Every day | ORAL | Status: DC

## 2015-11-28 NOTE — Progress Notes (Signed)
[name] OFF[facility] PROGRESS NOTE   Diagnosis: Esophagus cancer  INTERVAL HISTORY:   Bruce [name] returns as scheduled. His daughter reports he had a cough last week that became worse yesterday. No fever or dyspnea. He has intermittent mild dysphagia. No other complaint.   Objective:  Vital signs in last 24 hours:  Blood pressure 113/68, pulse 72, temperature 98.1 F (36.7 C), temperature source Oral, resp. rate 18, height '5\' 3"'$  (1.6 m), weight 118 lb 9.6 oz (53.797 kg), SpO2 100 %.    HEENT:  oropharynx without erythema or exudate Lymphatics:  no cervical, supraclavicular, or axillary nodes Resp:  lungs clear bilaterally, no respiratory distress Cardio:  regular rate and rhythm GI:  no hepatomegaly, no mass, nontender Vascular:  no leg edema   Imaging:  Dg Chest 2 View  11/28/2015  CLINICAL DATA:  Cough and congestion for 1 week, worsening. History of esophagectomy for carcinoma. EXAM: CHEST  2 VIEW COMPARISON:  PA and lateral chest 08/28/2015, 05/25/2015 and 12/15/2014. CT chest 12/23/2014. FINDINGS: Chronic small to moderate right pleural effusion is again seen. There is no pneumothorax. Right basilar atelectasis is noted. Mild prominence of the pulmonary interstitium is unchanged. Heart size is mildly enlarged. IMPRESSION: No acute disease. No change in a small to moderate right pleural effusion. Electronically Signed   By: Inge Rise M.D.   On: 11/28/2015 10:25    Medications: I have reviewed the patient's current medications.  Assessment/Plan: 1. Squamous cell carcinoma. Staging CT scans of the chest, abdomen and pelvis on 06/02/2012 negative for evidence of metastatic disease. Endoscopic ultrasound 06/11/2012 confirmed a uT3 uN1 tumor at 40 cm from the incisors. He began radiation 06/15/2012. He began weekly Taxol/carboplatin chemotherapy 06/16/2012. He completed 6 weekly chemotherapy treatments. The carboplatin was held with week 5 due to  thrombocytopenia. He completed the sixth weekly treatment with Taxol and carboplatin on 07/23/2012. He completed radiation on 07/29/2012 -he underwent an esophagectomy on 08/26/2012 with the pathology confirming a ypT3,ypN0 squamous cell carcinoma with perineural invasion identified.  2. History of solid/liquid dysphagia secondary to the obstructing esophagus mass.  3. Reflux. He is taking Protonix. 4. Hospitalization 07/19/2014 with abdominal pain and vomiting. CT scan showed a mid small bowel obstruction without discrete mass or evident etiology. Small bowel obstruction felt to be secondary to adhesions. 5. Renal failure during the 07/19/2014 hospitalization felt to be contrast-induced nephropathy. 6. Chest CT 06/15/2013 with postoperative changes from esophagectomy and gastric pull-through. 7 mm posterior subpleural nodule in the right lower lobe. Followup CT scan 12/16/2013 revealed an increase in the size of a right hilar node and right upper lobe nodule.  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  Chest CT 12/23/2014 with enlarged right hilar lymph node measuring 1.2 cm, previously 0.8 cm. Slight enlargement of a periaortic lymph node, 8 mm as compared to 7 mm on the previous study. Increased thickening of the right pleural rind.  Moderate sized right  pleural effusion.  Chest x-ray 11/28/2015, no change in right pleural effusion  7. Headaches. Negative brain CT 12/23/2014.    Disposition:   There is no clinical evidence for progression of the esophagus cancer. He will return for an office visit and chest x-ray in 3 months.  He most likely has a viral upper respiratory infection today. His daughter will contact us for a persistent cough or dyspnea.  He will return in 2 weeks for influenza and pneumococcal vaccines.  Bruce Coder, MD  11/28/2015  11:24 AM

## 2015-12-12 ENCOUNTER — Ambulatory Visit (HOSPITAL_BASED_OUTPATIENT_CLINIC_OR_DEPARTMENT_OTHER): Payer: Medicare Other

## 2015-12-12 VITALS — BP 107/67 | HR 72 | Temp 98.2°F

## 2015-12-12 DIAGNOSIS — Z23 Encounter for immunization: Secondary | ICD-10-CM | POA: Diagnosis present

## 2015-12-12 DIAGNOSIS — C155 Malignant neoplasm of lower third of esophagus: Secondary | ICD-10-CM

## 2015-12-12 MED ORDER — PNEUMOCOCCAL 13-VAL CONJ VACC IM SUSP
0.5000 mL | Freq: Once | INTRAMUSCULAR | Status: AC
  Administered 2015-12-12: 0.5 mL via INTRAMUSCULAR
  Filled 2015-12-12: qty 0.5

## 2015-12-12 MED ORDER — INFLUENZA VAC SPLIT QUAD 0.5 ML IM SUSY
0.5000 mL | PREFILLED_SYRINGE | Freq: Once | INTRAMUSCULAR | Status: AC
Start: 2015-12-12 — End: 2015-12-12
  Administered 2015-12-12: 0.5 mL via INTRAMUSCULAR
  Filled 2015-12-12: qty 0.5

## 2015-12-28 ENCOUNTER — Encounter: Payer: Self-pay | Admitting: Cardiothoracic Surgery

## 2015-12-28 ENCOUNTER — Ambulatory Visit (INDEPENDENT_AMBULATORY_CARE_PROVIDER_SITE_OTHER): Payer: Medicare Other | Admitting: Cardiothoracic Surgery

## 2015-12-28 VITALS — BP 109/72 | HR 79 | Resp 16 | Ht 63.0 in | Wt 119.0 lb

## 2015-12-28 DIAGNOSIS — C3411 Malignant neoplasm of upper lobe, right bronchus or lung: Secondary | ICD-10-CM | POA: Diagnosis not present

## 2015-12-28 DIAGNOSIS — Z9889 Other specified postprocedural states: Secondary | ICD-10-CM

## 2015-12-28 DIAGNOSIS — C155 Malignant neoplasm of lower third of esophagus: Secondary | ICD-10-CM

## 2015-12-28 DIAGNOSIS — Z9049 Acquired absence of other specified parts of digestive tract: Secondary | ICD-10-CM

## 2015-12-28 NOTE — Patient Instructions (Signed)
Food Choices for Gastroesophageal Reflux Disease, Adult When you have gastroesophageal reflux disease (GERD), the foods you eat and your eating habits are very important. Choosing the right foods can help ease your discomfort.  WHAT GUIDELINES DO I NEED TO FOLLOW?   Choose fruits, vegetables, whole grains, and low-fat dairy products.   Choose low-fat meat, fish, and poultry.  Limit fats such as oils, salad dressings, butter, nuts, and avocado.   Keep a food diary. This helps you identify foods that cause symptoms.   Avoid foods that cause symptoms. These may be different for everyone.   Eat small meals often instead of 3 large meals a day.   Eat your meals slowly, in a place where you are relaxed.   Limit fried foods.   Cook foods using methods other than frying.   Avoid drinking alcohol.   Avoid drinking large amounts of liquids with your meals.   Avoid bending over or lying down until 2-3 hours after eating.  WHAT FOODS ARE NOT RECOMMENDED?  These are some foods and drinks that may make your symptoms worse: Vegetables Tomatoes. Tomato juice. Tomato and spaghetti sauce. Chili peppers. Onion and garlic. Horseradish. Fruits Oranges, grapefruit, and lemon (fruit and juice). Meats High-fat meats, fish, and poultry. This includes hot dogs, ribs, ham, sausage, salami, and bacon. Dairy Whole milk and chocolate milk. Sour cream. Cream. Butter. Ice cream. Cream cheese.  Drinks Coffee and tea. Bubbly (carbonated) drinks or energy drinks. Condiments Hot sauce. Barbecue sauce.  Sweets/Desserts Chocolate and cocoa. Donuts. Peppermint and spearmint. Fats and Oils High-fat foods. This includes [place] fries and potato chips. Other Vinegar. Strong spices. This includes black pepper, white pepper, red pepper, cayenne, curry powder, cloves, ginger, and chili powder. The items listed above may not be a complete list of foods and drinks to avoid. Contact your dietitian for more  information.   This information is not intended to replace advice given to you by your health care provider. Make sure you discuss any questions you have with your health care provider.   Document Released: 05/05/2012 Document Revised: 11/25/2014 Document Reviewed: 09/08/2013 Elsevier Interactive Patient Education 2016 Elsevier Inc.  B?nh tro ng??c d? dy th?c qu?n, Ng??i l?n (Gastroesophageal Reflux Disease, Adult) Thng th??ng, th?c ?n di chuy?n xu?ng th?c qu?n v ? l?i d? dy ?? tiu ha. Tuy nhin, khi m?t ng??i b? b?nh tro ng??c d? dy th?c qu?n (GERD), th?c ?n v a xt trong d? dy tro ng??c tr? l?i th?c qu?n. Khi tnh tr?ng ny x?y ra, th?c qu?n b? lot v vim. D?n d?n, GERD c th? t?o ra nh?ng l? nh? (v?t lot) trn l?p nim m?c th?c qu?n.  NGUYN NHN Tnh tr?ng ny gy ra b?i m?t v?n ?? c?a ph?n c? gi?a th?c qu?n v d? dy (c? th?t th?c qu?n d??i, hay LES). Thng th??ng c? LES ?ng l?i sau khi th?c ?n ?i qua th?c qu?n vo d? dy. Khi LES b? y?u ho?c b?t th??ng, c? khng ?ng theo ?ng cch v ?i?u ? cho php th?c ?n v a xt d? dy tro ng??c tr? l?i th?c qu?n. LES c th? b? y?u do m?t s? ch?t ?n [place] nh?t ??nh, thu?c v cc tnh tr?ng b?nh l, bao g?m:  S? d?ng thu?c l.  Mang thai.  Thot v? honh.  S? d?ng nhi?u r??u.  M?t s? lo?i th?c ?n v ?? u?ng nh?t ??nh, nh? c ph, s c la, hnh v b?c h. CC Y?U T? NGUY C? Tnh  tr?ng ny hay x?y ra h?n ?:  Nh?ng ng??i t?ng cn.  Nh?ng ng??i c cc b?nh ? m lin k?t.  Nh?ng ng??i s? d?ng thu?c NSAID. TRI?U CH?NG Nh?ng tri?u ch?ng c?a tnh tr?ng ny bao g?m:  ? nng.  Kh nu?t ho?c ?au khi nu?t.  C?m th?y nh? c m?t kh?i c?c trong c? h?ng.  C?m gic ??ng trong mi?ng.  H?i th? hi.  C nhi?u n??c b?t.  C?m gic kh ch?u trong b?ng ho?c ch??ng b?ng.  ? h?i.  ?au ng?c.  Kh th? ho?c th? kh kh.  Ho lin t?c (m?n tnh) ho?c ho vo ban ?m.  B? h?ng l?p men r?ng.  S?t cn. Nh?ng tnh tr?ng khc nhau  c th? gy ?au ng?c. B?o ??m ph?i ??n khm chuyn gia ch?m [place] s?c kh?e n?u qu v? b? ?au ng?c. CH?N ?ON Chuyn gia ch?m [place] s?c kh?e c?a qu v? s? h?i v? b?nh s? v khm th?c th? cho qu v?. ?? xc ??nh qu v? b? GERD nh? hay n?ng, chuyn gia ch?m [place] s?c kh?e c?ng c th? theo di qu v? ?p ?ng v?i vi?c ?i?u tr? nh? th? no. Qu v? c?ng c th? ph?i lm cc ki?m tra khc, bao g?m:  N?i soi ?? ki?m tra d? dy v th?c qu?n b?ng m?t camera nh?.  Ki?m tra ?o n?ng ?? a xt trong th?c qu?n c?a qu v?.  Ki?m tra ?o m?c p l?c ln th?c qu?n c?a qu v?.  Nu?t bari ho?c nu?t bari ?i?u ch?nh ?? hi?n th? hnh dng, kch th??c v ch?c n?ng c?a th?c qu?n c?a qu v?. ?I?U TR? M?c tiu c?a ?i?u tr? l gip gi?m cc tri?u ch?ng v trnh bi?n ch?ng. Vi?c ?i?u tr? b?nh ny c th? khc nhau ty thu?c m?c ?? n?ng c?a tri?u ch?ng. Chuyn gia ch?m [place] s?c kh?e c?a qu v? c th? khuy?n ngh?:  Thay ??i ch? ?? ?n.  Thu?c.  Ph?u thu?t. H??NG D?N CH?M [place] T?I NH Ch? ?? ?n  Tun th? m?t ch? ?? ?n theo khuy?n ngh? c?a chuyn gia ch?m [place] s?c kh?e. Vi?c ny c th? l trnh cc th?c ?n v ?? u?ng nh?:  C ph v tr (c ho?c khng c caffeine).  ?? u?ng c ch?ar??u.  ?? u?ng t?ng l?c v ?? u?ng dng trong th? thao.  ?? u?ng c ga ho?c soda.  S c la v c ca.  B?c h v h??ng v? b?c h.  T?i v hnh.  C?i ng?a (Horseradish).  Cc th?c ?n nhi?u gia v? v a xt, bao g?m h?t tiu, b?t ?t, b?t ca ri, gi?m, n??c s?t cay, v n??c s?t barbecue.  N??c qu? ho?c qu? h? cam qut, ch?ng h?n nh? cam, [place]ul v [place] l cam.  Cc th?c ?n c c chua, nh? n??c x?t ??, ?t, n??c x?t salsa, v pizza km x?t ??  Th?c ?n chin v nhi?u ch?t bo, ch?ng h?n nh? bnh rn, khoai ty chin, khoai ty rn v n??c x?t nhi?u ch?t bo.  Th?t nhi?u ch?t bo, ch?ng h?n nh? hot dog (bnh m k?p xc xch) v cc lo?i th?t ?? v tr?ng nhi?u m?, ch?ng h?n nh? th?t n?c l?ng, xc xch, gi?m bng v th?t l?n xng khi.  Nh?ng s?n  ph?m b? s?a giu ch?t bo, nh? s?a nguyn kem, b? v pho mt kem.  ?n cc b?a nh?, th??ng xuyn thay v cc b?a no.  Trnh u?ng nhi?u n??c khi  qu v? ?n.  Trnh ?n trong kho?ng 2-3 gi? tr??c khi ?i ng?.  Trnh n?m xu?ng ngay sau khi ?n.  Khngt?p th? d?c ngay sau khi ?n. H??ng d?n chung  Ch  ??n nh?ng thay ??i v? tri?u ch?ng c?a qu v?.  Ch? s? d?ng thu?c khng c?n k ??n v thu?c c?n k ??n theo ch? d?n c?a chuyn gia ch?m [place] s?c kh?e. Khng dng aspirin, ibuprofen, ho?c cc thu?c NSAID khc tr? khi chuyn gia ch?m [place] s?c kh?e c?a qu v? cho php.  Khng s? d?ng b?t k? s?n ph?m thu?c l no, bao g?m thu?c l d?ng ht, thu?c l d?ng nhai v thu?c l ?i?n t?. N?u qu v? c?n gip ?? ?? cai thu?c, hy h?i chuyn gia ch?m [place] s?c kh?e.  M?c qu?n o r?ng. Khng m?c ci g ch?t quanh eo m c th? t?o p l?c ln b?ng.  Nng (nng cao) ??u gi??ng c?a qu v? thm 6 inch (15 cm).  C? g?ng gi?m c?ng th?ng, ch?ng h?n nh? t?p yoga ho?c thi?n. N?u qu v? c?n gip ?? ?? gi?m c?ng th?ng, hy h?i chuyn gia ch?m [place] s?c kh?e.  N?u qu v? th?a cn, hy gi?m cn n?ng v? m?c c l?i cho s?c kh?e c?a qu v?. Hy h?i chuyn gia ch?m [place] s?c kh?e ?? ???c h??ng d?n v? m?c tiu gi?m cn an ton.  Tun th? t?t c? cc cu?c h?n khm l?i theo ch? d?n c?a chuyn gia ch?m [place] s?c kh?e. ?i?u ny c vai tr quan tr?ng. ?I KHM N?U:  Qu v? c cc tri?u ch?ng m?i.  Qu v? b? s?t cn khng r nguyn nhn.  Qu v? b? kh nu?t ho?c b? ?au khi nu?t.  Qu v? th? kh kh ho?c ho dai d?ng.  Cc tri?u ch?ng c?a qu v? khng c?i thi?n sau khi ???c ?i?u tr?Sander Nephew v? b? kh?n gi?ng. NGAY L?P T?C ?I KHM N?U:  Qu v? b? ?au ? cnh tay, c?, hm, r?ng ho?c l?ng.  Qu v? th?y ?? m? hi, chng m?t ho?c chong vng.  Qu v? b? ?au ng?c ho?c kh th?.  Qu v? nn v ch?t nn ra gi?ng nh? mu ho?c b c ph.  Qu v? b? ng?t.  Phn c?a qu v? c mu ho?c mu ?en.  Qu v? khng th? nu?t, u?ng hay ?n.   Thng tin  ny khng nh?m m?c ?ch thay th? cho l?i khuyn m chuyn gia ch?m [place] s?c kh?e ni v?i qu v?. Hy b?o ??m qu v? ph?i th?o lu?n b?t k? v?n ?? g m qu v? c v?i chuyn gia ch?m  s?c kh?e c?a qu v?.   Document Released: 08/14/2005 Document Revised: 07/26/2015 Elsevier Interactive Patient Education [employer].

## 2015-12-28 NOTE — Progress Notes (Signed)
[facility] Record #[id number] Date of Birth: 11-17-1961  Referring: Ladell Pier, MD Primary Care: No PCP Per Patient  Chief Complaint:    Chief Complaint  Patient presents with  . Routine Post Op    6 month f/u...had CXR 11/28/15    History of Present Illness:    Bruce [name] 55 y.o. 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 On followup scans the patient was noted to have an increasing right lung lesion.  The patient underwent  wedge resection of right lung lesion and node dissection, final pathology is suggestive of squamous cell from known metastatic from esophageal primary in march 2015 .  He is been followed by oncology, recently had a chest x-ray He has been able to maintain his weight stable. Does have some abdominal discomfort and early satiety, if he takes excessive amount of milk products he does have dumping syndrome. He also notes a cough primarily at nighttime nonproductive no hemoptysis.  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   Zubrod Score: At the time of surgery this patient's most appropriate activity status/level should be described as: '[]'$     0    Normal activity, no symptoms '[x]'$     1     Restricted in physical strenuous activity but ambulatory, able to do out light work '[]'$     2    Ambulatory and capable of self care, unable to do work activities, up and about               >50 % of waking hours                              '[]'$     3    Only limited self care, in bed greater than 50% of waking hours '[]'$     4    Completely disabled, no self care, confined to bed or chair '[]'$     5    Moribund   Past Medical History  Diagnosis Date  . Esophagitis   . Mass of esophagus 05/28/2012    BX'D DISTAL ESOPHAGUS,PENDING  . Dysphasia     solid and liquid  . Neuropathy ([place])     right hand numbness 1 year 2012  . Cancer ([place]) 05/28/12    bx=esophagus=squamous cell carcinoma  . Lung cancer ([place]) 12/27/13    right upper lung    Past Surgical History  Procedure Laterality Date  . 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 Eus  06/11/2012    Procedure: UPPER ENDOSCOPIC ULTRASOUND (EUS) RADIAL;  Surgeon: Milus Banister, MD;  Location:  [facility];  Service: Endoscopy;  Laterality: N/A;  . Complete esophagectomy  08/26/2012    Procedure: ESOPHAGECTOMY COMPLETE;  Surgeon: [name], MD;  Location: [facility];  Service: Thoracic;  Laterality: N/A;  transhiatal   . Pyloroplasty  08/26/2012    Procedure: PYLOROPLASTY;  Surgeon: [name], MD;  Location: [facility];  Service: Thoracic;  Laterality: N/A;  . Video bronchoscopy  08/26/2012    Procedure: VIDEO BRONCHOSCOPY;  Surgeon: [name], MD;  Location: [facility];  Service: Thoracic;  Laterality: N/A;  . Jejunostomy  08/26/2012    Procedure: JEJUNOSTOMY;  Surgeon: [name], MD;  Location: [facility];  Service: Thoracic;  Laterality: N/A;  placement of feeding jujunostomy tube  . Video bronchoscopy N/A 01/31/2014    Procedure: VIDEO BRONCHOSCOPY;  Surgeon: [name], MD;  Location: [facility] OR;  Service: Thoracic;  Laterality: N/A;  . Video assisted  thoracoscopy (vats)/wedge resection Right 01/31/2014    Procedure: VIDEO ASSISTED THORACOSCOPY (VATS)/WEDGE RESECTION; with insertion of On Q pain pump;  Surgeon: [name], MD;  Location: [facility];  Service: Thoracic;  Laterality: Right;  with insertion of On Q pain pump  . Lymph node dissection Right 01/31/2014    Procedure: LYMPH NODE DISSECTION;  Surgeon: [name], MD;  Location[facility];  Service: Thoracic;  Laterality: Right;    Family History  Problem Relation Age of Onset  . Breast cancer Sister     History   Social History  . Marital Status: Married    Spouse Name: N/A    Number of Children: N/A  . Years of Education: N/A   Occupational History  . welder    Social History Main Topics  . Smoking status: Former Smoker -- 0.40 packs/day for 25 years    Types: Cigarettes    Quit date: 06/19/2011  . Smokeless tobacco: Former Systems developer    Quit date: 06/03/2007  . Alcohol Use: No     Comment: not currently (01/28/14)  . Drug Use: No  . Sexual Activity: Not on file      History  Smoking status  . Former Smoker -- 0.40 packs/day for 25 years  . Types: Cigarettes  . Quit date: 06/19/2011  Smokeless tobacco  . Former Systems developer  . Quit date: 06/03/2007    History  Alcohol Use No    Comment: not currently (01/28/14)     No Known Allergies  Current Outpatient Prescriptions  Medication Sig Dispense Refill  . pantoprazole (PROTONIX) 40 MG tablet Take 1 tablet (40 mg total) by mouth daily. 30 tablet 5   No current facility-administered medications for this visit.     Review of Systems:     Cardiac Review of Systems: Y or N  Chest Pain [ n   ]  Resting SOB [ n  ] Exertional SOB  [n  ]  Orthopnea [ n ]   Pedal Edema [ n  ]    Palpitations [ n ] Syncope  [ n ]   Presyncope [  n ]  General Review of Systems: [Y] = yes [  ]=no Constitional: recent weight change [n  ];  Wt loss over the last 3 months [   ] anorexia [  ]; fatigue Blue.Reese  ]; nausea [ n ]; night sweats [ n  ]; fever [ n ]; or chills [n  ];          Dental: poor dentition[n  ]; Last Dentist visit:  Eye : blurred vision [  ]; diplopia [   ]; vision changes [  ];  Amaurosis fugax[  ]; Resp: cough [  ];  wheezing[n  ];  hemoptysis[ n ]; shortness of breath[ n ]; paroxysmal nocturnal dyspnea[ n ]; dyspnea on exertion[ n ]; or orthopnea[  ];  GI:  gallstones[  ], vomiting[ n ];  dysphagia[  ]; melena[ n ];  hematochezia [n  ]; heartburn[ y ];   Hx of  Colonoscopy[  ]; GU: kidney stones [  ]; hematuria[  ];   dysuria [n  ];  nocturia[  ];  history of     obstruction [ n ]; urinary frequency [ n ]             Skin: rash, swelling[  ];, hair loss[  ];  peripheral edema[  ];  or itching[  ]; Musculosketetal: myalgias[  ];  joint swelling[  ];  joint erythema[  ];  joint pain[  ];  back pain[  ];  Heme/Lymph: bruising[  ];  bleeding[n  ];  anemia[  ];  Neuro: TIA[  ];  headaches[  ];  stroke[ n ];  vertigo[  ];  seizures[  ];   paresthesias[  ];  difficulty walking[n  ];  Psych:depression[  ]; anxiety[  ];  Endocrine: diabetes[ n ];  thyroid dysfunction[n  ];  Immunizations: Flu up to date Blue.Reese  ]; Pneumococcal up to date [  y];  Other:  Physical Exam: BP 109/72 mmHg  Pulse 79  Resp 16  Ht '5\' 3"'$  (1.6 m)  Wt 119 lb (53.978 kg)  BMI 21.09 kg/m2  SpO2 98%   PHYSICAL EXAMINATION:  Wt Readings from Last 3 Encounters:  12/28/15 119 lb (53.978 kg)  11/28/15 118 lb 9.6 oz (53.797 kg)  08/28/15 121 lb 12.8 oz (55.248 kg)   General appearance: alert, cooperative, appears stated age and no distress Neurologic: intact Heart: regular rate and rhythm, S1, S2 normal, no murmur, click, rub or gallop Lungs: clear to auscultation bilaterally Abdomen: soft, non-tender; bowel sounds normal; no masses,  no organomegaly abdominal incisions well-healed without incisional hernia Extremities: extremities normal, atraumatic, no cyanosis or edema and Homans sign is negative, no sign of DVT Wound: Patient's incisions in  the left neck and mid abdomen are well-healed, The right chest incision is well-healed without evidence of infection Patient has no cervical or supraclavicular or axillary adenopathy.  Diagnostic Studies & Laboratory data:     Recent Radiology Findings: CLINICAL DATA: Cough and congestion for 1 week, worsening. History of esophagectomy for carcinoma.  EXAM: CHEST 2 VIEW  COMPARISON: PA and lateral chest 08/28/2015, 05/25/2015 and 12/15/2014. CT chest 12/23/2014.  FINDINGS: Chronic small to moderate right pleural effusion is again seen. There is no pneumothorax. Right basilar atelectasis is noted. Mild prominence of the pulmonary interstitium is unchanged. Heart size is mildly enlarged.  IMPRESSION: No acute disease.  No change in a small to moderate right pleural effusion.   Electronically Signed  By: Inge Rise M.D.  On: 11/28/2015 10:25   I have independently reviewed the above radiology studies  and reviewed the findings with the patient and his daughter.   Nm Pet Image Restag (ps) Skull Base To Thigh  01/11/2014   CLINICAL DATA:  Subsequent treatment strategy for esophageal cancer. Recent biopsy of a cavitary right upper lobe lung lesion revealed invasive squamous cell carcinoma.  EXAM: NUCLEAR MEDICINE PET SKULL BASE TO THIGH  FASTING BLOOD GLUCOSE:  Value: 92 mg/dl  TECHNIQUE: 6.3 mCi F-18 FDG was injected intravenously. Full-ring PET imaging was performed from the skull base to thigh after the radiotracer. CT data was obtained and used for attenuation correction and anatomic localization.  COMPARISON:  CT BIOPSY dated 12/27/2013  FINDINGS: NECK  No hypermetabolic lymph nodes in the neck.  CHEST  Gastric pull-through noted. Cavitary posterior right upper lobe mass, 2.1 x 1.5 cm, maximum standard uptake value 7.7. Posterior left infrahilar lymph node, 2.3 x 2.0 cm, maximum standard uptake value 9.8. Adjacent indistinct infrahilar node has maximum standard  uptake value of 5.9.  A vascular structure or small lymph node posterior to the descending thoracic aorta has a maximum standard uptake value of 3.4 and is probably incidental.  ABDOMEN/PELVIS  Mid transverse colon has some faintly increased signal but is also small in caliber, unless this increased signal (standard uptake value 6.1) is probably physiologic. Mildly prominent but gas-filled appendix does not appear actively inflamed. It is  SKELETON  No focal hypermetabolic activity to suggest skeletal metastasis.  IMPRESSION: 1. Hypermetabolic cavitary right upper lobe mass corresponding to the known invasive squamous cell carcinoma. Pathologic right infrahilar adenopathy. 2. Faintly increased activity in the mid transverse colon is probably physiologic.   Electronically Signed   By: Sherryl Barters M.D.   On: 01/11/2014 11:46    Recent Lab Findings: Lab Results  Component Value Date   WBC 8.9 07/29/2014   HGB 12.8* 07/29/2014   HCT 39.9 07/29/2014   PLT 321 07/29/2014   GLUCOSE 247* 12/26/2014   ALT 13 12/26/2014   AST 18 12/26/2014   NA 139 12/26/2014   K 4.3 12/26/2014   CL 100 07/26/2014   CREATININE 1.3 12/26/2014   BUN 13.2 12/26/2014   CO2 22 12/26/2014   TSH 0.853 07/20/2014   INR 1.13 07/21/2014   HGBA1C 6.2* 07/20/2014      Assessment / Plan:   Advanced stage esophageal cancer, currently under observation only. Patient is maintained his nutritional status adequately   I reviewed with him and his daughter the management of reflux symptoms, I'm suspicious that his nighttime cough is related to reflux and not infectious etiology Small right pleural effusion appear stable  I plan to see back in 6 months or at Dr. Benay Spice as requested anytime.   [name] MD      [address] [place] [address] Office [phone]   Beeper [phone]  12/28/2015 10:34 AM

## 2016-02-19 ENCOUNTER — Ambulatory Visit (HOSPITAL_BASED_OUTPATIENT_CLINIC_OR_DEPARTMENT_OTHER): Payer: Medicare Other | Admitting: Nurse Practitioner

## 2016-02-19 ENCOUNTER — Telehealth: Payer: Self-pay | Admitting: Oncology

## 2016-02-19 ENCOUNTER — Ambulatory Visit (HOSPITAL_COMMUNITY)
Admission: RE | Admit: 2016-02-19 | Discharge: 2016-02-19 | Disposition: A | Discharge status: Home / Self Care | Payer: Medicare Other | Source: Ambulatory Visit | Attending: Nurse Practitioner | Admitting: Nurse Practitioner

## 2016-02-19 VITALS — BP 111/74 | HR 76 | Temp 98.2°F | Resp 18 | Ht 63.0 in | Wt 119.4 lb

## 2016-02-19 DIAGNOSIS — C779 Secondary and unspecified malignant neoplasm of lymph node, unspecified: Secondary | ICD-10-CM | POA: Diagnosis not present

## 2016-02-19 DIAGNOSIS — C7801 Secondary malignant neoplasm of right lung: Secondary | ICD-10-CM | POA: Diagnosis not present

## 2016-02-19 DIAGNOSIS — J90 Pleural effusion, not elsewhere classified: Secondary | ICD-10-CM | POA: Insufficient documentation

## 2016-02-19 DIAGNOSIS — R05 Cough: Secondary | ICD-10-CM

## 2016-02-19 DIAGNOSIS — C155 Malignant neoplasm of lower third of esophagus: Secondary | ICD-10-CM

## 2016-02-19 NOTE — Progress Notes (Signed)
[facility] OFFICE PROGRESS NOTE   Diagnosis:  Esophagus cancer  INTERVAL HISTORY:   Mr. Lessley returns as scheduled. He continues to have a cough. He intermittently feels as if something is "stuck" in his throat. He has a good appetite. His weight is stable. He has occasional mild dyspnea. No fever.  Objective:  Vital signs in last 24 hours:  Blood pressure 111/74, pulse 76, temperature 98.2 F (36.8 C), temperature source Oral, resp. rate 18, height '5\' 3"'$  (1.6 m), weight 119 lb 6.4 oz (54.159 kg), SpO2 100 %.    HEENT: No thrush or ulcers. Lymphatics: No palpable cervical, supraclavicular or axillary lymph nodes. Resp: Breath sounds diminished at the right lung base. No respiratory distress. Cardio: Regular rate and rhythm. GI: Abdomen soft and nontender. No hepatomegaly. No mass. Vascular: No leg edema.   Lab Results:  Lab Results  Component Value Date   WBC 8.9 07/29/2014   HGB 12.8* 07/29/2014   HCT 39.9 07/29/2014   MCV 85.5 07/29/2014   PLT 321 07/29/2014   NEUTROABS 5.9 07/29/2014    Imaging:  No results found.  Medications: I have reviewed the patient's current medications.  Assessment/Plan: 1. Squamous cell carcinoma. Staging CT scans of the chest, abdomen and pelvis on 06/02/2012 negative for evidence of metastatic disease. Endoscopic ultrasound 06/11/2012 confirmed a uT3 uN1 tumor at 40 cm from the incisors. He began radiation 06/15/2012. He began weekly Taxol/carboplatin chemotherapy 06/16/2012. He completed 6 weekly chemotherapy treatments. The carboplatin was held with week 5 due to thrombocytopenia. He completed the sixth weekly treatment with Taxol and carboplatin on 07/23/2012. He completed radiation on 07/29/2012 -he underwent an esophagectomy on 08/26/2012 with the pathology confirming a ypT3,ypN0 squamous cell carcinoma with perineural invasion identified.  2. History of solid/liquid dysphagia secondary to the obstructing esophagus  mass.  3. Reflux. He is taking Protonix. 4. Hospitalization 07/19/2014 with abdominal pain and vomiting. CT scan showed a mid small bowel obstruction without discrete mass or evident etiology. Small bowel obstruction felt to be secondary to adhesions. 5. Renal failure during the 07/19/2014 hospitalization felt to be contrast-induced nephropathy. 6. Chest CT 06/15/2013 with postoperative changes from esophagectomy and gastric pull-through. 7 mm posterior subpleural nodule in the right lower lobe. Followup CT scan 12/16/2013 revealed an increase in the size of a right hilar node and right upper lobe nodule.  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  Chest CT 12/23/2014 with enlarged right hilar lymph node measuring 1.2 cm, previously 0.8 cm. Slight enlargement of a periaortic lymph node, 8 mm as compared to 7 mm on the previous study. Increased thickening of the right pleural rind. Moderate sized right pleural effusion.  Chest x-ray 11/28/2015, no change in right pleural effusion 7. Headaches. Negative brain CT 12/23/2014.   Disposition: Mr. Giannotti appears stable. He has a persistent cough and intermittently feels as if something is "stuck" in his throat. We made  a referral for a barium swallow, question stricture.  We will follow-up on the chest x-ray  result from today.  He will return for a follow-up visit and chest x-ray in 3 months. He will contact the office in the interim with any problems.  Plan reviewed with Dr. Benay Spice.    Ned Card ANP/GNP-BC   02/19/2016  11:17 AM

## 2016-02-19 NOTE — Telephone Encounter (Signed)
Gave patient/relative avs report and appointment for July. Patient/relative also given radiology requisition request for cxr to be done today and again on 05/30/16 prior to 3 pm f/u. Central radiology scheduling to contact patient re DG esophagus - patient/relative aware. Also spoke with central to check that or for DG esophagus populate in the radiology WQ.

## 2016-02-20 ENCOUNTER — Telehealth: Payer: Self-pay | Admitting: *Deleted

## 2016-02-20 NOTE — Telephone Encounter (Signed)
-----   Message from Ladell Pier, MD sent at 02/19/2016  6:09 PM EDT ----- Please call daughter, cxr shows slight increase in rt. Pleural fluid compared to January.  Call for dyspnea, f/u as scheduled

## 2016-02-20 NOTE — Telephone Encounter (Signed)
Message from pt's daughter returning call, requesting return call. Same done, no answer. Left message instructing her to request to speak to any nurse for chest Xray results.

## 2016-02-21 NOTE — Telephone Encounter (Signed)
Pt's daughter returned call. Informed her of chest Xray result: Slight increase in R pleural fluids compared to January. Call office for shortness of breath. She voiced understanding. Confirmed next appt.

## 2016-02-26 ENCOUNTER — Ambulatory Visit (HOSPITAL_COMMUNITY)
Admission: RE | Admit: 2016-02-26 | Discharge: 2016-02-26 | Disposition: A | Discharge status: Home / Self Care | Payer: Medicare Other | Source: Ambulatory Visit | Attending: Nurse Practitioner | Admitting: Nurse Practitioner

## 2016-02-26 DIAGNOSIS — T17300A Unspecified foreign body in larynx causing asphyxiation, initial encounter: Secondary | ICD-10-CM | POA: Diagnosis not present

## 2016-02-26 DIAGNOSIS — R1310 Dysphagia, unspecified: Secondary | ICD-10-CM | POA: Diagnosis not present

## 2016-02-26 DIAGNOSIS — C155 Malignant neoplasm of lower third of esophagus: Secondary | ICD-10-CM | POA: Diagnosis not present

## 2016-02-28 ENCOUNTER — Telehealth: Payer: Self-pay | Admitting: *Deleted

## 2016-02-28 NOTE — Telephone Encounter (Signed)
-----   Message from Ladell Pier, MD sent at 02/28/2016  3:27 PM EDT ----- Please call patient, daughter, swallow study shows no esophagus obstruction

## 2016-02-29 ENCOUNTER — Telehealth: Payer: Self-pay | Admitting: *Deleted

## 2016-02-29 NOTE — Telephone Encounter (Signed)
-----   Message from Ladell Pier, MD sent at 02/28/2016  3:27 PM EDT ----- Please call patient, daughter, swallow study shows no esophagus obstruction

## 2016-03-01 ENCOUNTER — Telehealth: Payer: Self-pay | Admitting: *Deleted

## 2016-03-01 NOTE — Telephone Encounter (Signed)
TC from pt's daughter for results of barium swallow. Informed daughter of results. She voiced understanding.

## 2016-03-13 ENCOUNTER — Ambulatory Visit (INDEPENDENT_AMBULATORY_CARE_PROVIDER_SITE_OTHER): Payer: Medicare Other

## 2016-03-13 ENCOUNTER — Encounter (HOSPITAL_COMMUNITY): Payer: Self-pay | Admitting: Emergency Medicine

## 2016-03-13 ENCOUNTER — Ambulatory Visit (HOSPITAL_COMMUNITY)
Admission: EM | Admit: 2016-03-13 | Discharge: 2016-03-13 | Disposition: A | Discharge status: Home / Self Care | Payer: Medicare Other | Attending: Emergency Medicine | Admitting: Emergency Medicine

## 2016-03-13 DIAGNOSIS — J40 Bronchitis, not specified as acute or chronic: Secondary | ICD-10-CM

## 2016-03-13 DIAGNOSIS — R05 Cough: Secondary | ICD-10-CM | POA: Diagnosis not present

## 2016-03-13 MED ORDER — ALBUTEROL SULFATE (2.5 MG/3ML) 0.083% IN NEBU
2.5000 mg | INHALATION_SOLUTION | Freq: Once | RESPIRATORY_TRACT | Status: AC
  Administered 2016-03-13: 2.5 mg via RESPIRATORY_TRACT

## 2016-03-13 MED ORDER — BENZONATATE 100 MG PO CAPS: 100.0000 mg | ORAL_CAPSULE | Freq: Three times a day (TID) | ORAL | Status: DC

## 2016-03-13 MED ORDER — AZITHROMYCIN 250 MG PO TABS: ORAL_TABLET | ORAL | Status: DC

## 2016-03-13 MED ORDER — ALBUTEROL SULFATE (2.5 MG/3ML) 0.083% IN NEBU
INHALATION_SOLUTION | RESPIRATORY_TRACT | Status: AC
  Filled 2016-03-13: qty 3

## 2016-03-13 MED ORDER — ALBUTEROL SULFATE HFA 108 (90 BASE) MCG/ACT IN AERS: 2.0000 | INHALATION_SPRAY | RESPIRATORY_TRACT | Status: DC | PRN

## 2016-03-13 NOTE — ED Notes (Signed)
Cough for 2 months.  Reports phlegm is white, sometimes milky and white.  Denies fever.  Denies runny nose.  No ear pain or sore throat, but uncomfortable in throat.  Patient has had esophagus cancer and now has lung cancer.  Concerned about cough

## 2016-03-13 NOTE — ED Notes (Signed)
Patient receiving breathing treatment prior to CXR

## 2016-03-13 NOTE — ED Provider Notes (Signed)
CSN: [id number]     Arrival date & time 03/13/16  1329 History   First MD Initiated Contact with Patient 03/13/16 1435     Chief Complaint  Patient presents with  . Cough   (Consider location/radiation/quality/duration/timing/severity/associated sxs/prior Treatment) HPI History obtained from patient: Location:  chest Context/Duration: Onset of dry nonproductive cough 2 months ago.   Severity: no pain   Quality: dry Timing:       Constant, worse at night     Home Treatment: OTC meds without relief Associated symptoms:  Occasional sputum production  Past Medical History  Diagnosis Date  . Esophagitis   . Mass of esophagus 05/28/2012    BX'D DISTAL ESOPHAGUS,PENDING  . Dysphasia     solid and liquid  . Neuropathy ([place])     right hand numbness 1 year 2012  . Cancer ([place]) 05/28/12    bx=esophagus=squamous cell carcinoma  . Lung cancer ([place]) 12/27/13    right upper lung   Past Surgical History  Procedure Laterality Date  . 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 Eus  06/11/2012    Procedure: UPPER ENDOSCOPIC ULTRASOUND (EUS) RADIAL;  Surgeon: Milus Banister, MD;  Location: [facility];  Service: Endoscopy;  Laterality: N/A;  . Complete esophagectomy  08/26/2012    Procedure: ESOPHAGECTOMY COMPLETE;  Surgeon: [name], MD;  Location: [place];  Service: Thoracic;  Laterality: N/A;  transhiatal   . Pyloroplasty  08/26/2012    Procedure: PYLOROPLASTY;  Surgeon: [name], MD;  Location: [place];  Service: Thoracic;  Laterality: N/A;  . Video bronchoscopy  08/26/2012    Procedure: VIDEO BRONCHOSCOPY;  Surgeon: [name], MD;  Location: [place];  Service: Thoracic;  Laterality: N/A;  . Jejunostomy  08/26/2012    Procedure: JEJUNOSTOMY;  Surgeon: [name], MD;  Location: [place];  Service: Thoracic;  Laterality: N/A;  placement of feeding jujunostomy tube  . Video bronchoscopy N/A  01/31/2014    Procedure: VIDEO BRONCHOSCOPY;  Surgeon: [name], MD;  Location: [facility] OR;  Service: Thoracic;  Laterality: N/A;  . Video assisted thoracoscopy (vats)/wedge resection Right 01/31/2014    Procedure: VIDEO ASSISTED THORACOSCOPY (VATS)/WEDGE RESECTION; with insertion of On Q pain pump;  Surgeon: [name], MD;  Location: [place];  Service: Thoracic;  Laterality: Right;  with insertion of On Q pain pump  . Lymph node dissection Right 01/31/2014    Procedure: LYMPH NODE DISSECTION;  Surgeon: [name], MD;  Location: [facility] OR;  Service: Thoracic;  Laterality: Right;   Family History  Problem Relation Age of Onset  . Breast cancer Sister    Social History  Substance Use Topics  . Smoking status: Former Smoker -- 0.40 packs/day for 25 years    Types: Cigarettes    Quit date: 06/19/2011  . Smokeless tobacco: Former Systems developer    Quit date: 06/03/2007  . Alcohol Use: No     Comment: not currently (01/28/14)    Review of Systems ROS +'ve cough  Denies: HEADACHE, NAUSEA, ABDOMINAL PAIN, CHEST PAIN, CONGESTION, DYSURIA, SHORTNESS OF BREATH  Allergies  Review of patient's allergies indicates no known allergies.  Home Medications   Prior to Admission medications   Medication Sig Start Date End Date Taking? Authorizing Provider  Phenylephrine-Pheniramine-DM [facility] COLD & COUGH PO) Take by mouth.   Yes Historical Provider, MD  albuterol (PROVENTIL HFA;VENTOLIN HFA) 108 (90 Base)  MCG/ACT inhaler Inhale 2 puffs into the lungs every 4 (four) hours as needed for wheezing or shortness of breath. 03/13/16   Konrad Felix, PA  azithromycin (ZITHROMAX Z-PAK) 250 MG tablet Take 2 tablets today then 1 tab days 2-5. 03/13/16   Konrad Felix, PA  benzonatate (TESSALON) 100 MG capsule Take 1 capsule (100 mg total) by mouth every 8 (eight) hours. 03/13/16   Konrad Felix, PA  pantoprazole (PROTONIX) 40 MG tablet Take 1 tablet (40 mg total) by mouth daily. 11/28/15   Ladell Pier, MD   Meds Ordered and Administered this Visit   Medications  albuterol (PROVENTIL) (2.5 MG/3ML) 0.083% nebulizer solution 2.5 mg (2.5 mg Nebulization Given 03/13/16 1451)    BP 102/62 mmHg  Pulse 97  Temp(Src) 98.9 F (37.2 C) (Oral)  Resp 12  SpO2 100% No data found.   Physical Exam NURSES NOTES AND VITAL SIGNS REVIEWED. CONSTITUTIONAL: Well developed, well nourished, no acute distress HEENT: normocephalic, atraumatic, right and left TM's are normal EYES: Conjunctiva normal NECK:normal ROM, supple, no adenopathy PULMONARY:No respiratory distress, normal effort, Lungs: CTAb/l, no wheezes, or increased work of breathing CARDIOVASCULAR: RRR, no murmur ABDOMEN: soft, ND, NT, +'ve BS MUSCULOSKELETAL: Normal ROM of all extremities,  SKIN: warm and dry without rash PSYCHIATRIC: Mood and affect, behavior are normal    ED Course  Procedures (including critical care time)  Labs Review Labs Reviewed - No data to display  Imaging Review Dg Chest 2 View  03/13/2016  CLINICAL DATA:  Productive cough.  Sick for months EXAM: CHEST  2 VIEW COMPARISON:  02/19/2016, CT chest 12/23/2014 FINDINGS: There is bilateral diffuse interstitial thickening. There is a stable moderate sized partially loculated right pleural effusion. There is no new focal parenchymal opacity. There is no pleural effusion or pneumothorax. The heart and mediastinal contours are unremarkable. The osseous structures are unremarkable. IMPRESSION: 1. Stable moderate-sized partially loculated right pleural effusion. No significant interval change. 2. Diffuse bilateral interstitial thickening unchanged from the prior exams. Electronically Signed   By: Kathreen Devoid   On: 03/13/2016 15:13   I HAVE REVIEWED AND DISCUSSED RESULTS OF THE X-RAYS WITH PATIENT AND HIS DAUGHTER.     Visual Acuity Review  Right Eye Distance:   Left Eye Distance:   Bilateral Distance:    Right Eye Near:   Left Eye Near:    Bilateral  Near:      Pt is feeling better after nebulizer treatment  RX albuterol, tessalon, zpak  MDM   1. Bronchitis     Patient is reassured that there are no issues that require transfer to higher level of care at this time or additional tests. Patient is advised to continue home symptomatic treatment. Patient is advised that if there are new or worsening symptoms to attend the emergency department, contact primary care provider, or return to UC. Instructions of care provided discharged home in stable condition.    THIS NOTE WAS GENERATED USING A VOICE RECOGNITION SOFTWARE PROGRAM. ALL REASONABLE EFFORTS  WERE MADE TO PROOFREAD THIS DOCUMENT FOR ACCURACY.  I have verbally reviewed the discharge instructions with the patient. A printed AVS was given to the patient.  All questions were answered prior to discharge.      Konrad Felix, PA 03/13/16 1626

## 2016-03-13 NOTE — Discharge Instructions (Signed)
°  B?n ? ???c ch?n ?on b? vim ph? qu?n  B?n s? ???c cho ba lo?i thu?c ?? mang v? nh: Azithromycin l m?t khng sinh. U?ng hai vin thu?c ngy hm nay, sau ? m?t vin m?i ngy trong b?n ngy ti?p theo. Albuterol: u?ng m?i l?n 4 l?n khi c?n. Tessalon perles s? gip b?n ho.  Hy ?i khm bc s? n?u b?n c?m th?y mnh khng kh?e.  You have been diagnosed with a bronchitis  You will be given three medications to take at home: azithromycin is an antibiotic. Take two pills today, then one pill each day for the next four days. Albuterol: take two puffs of this inhaler every four hours as needed. Tessalon perles will help with your cough.   See your doctor for follow up if you feel you are nto getting better.

## 2016-04-18 ENCOUNTER — Ambulatory Visit (HOSPITAL_COMMUNITY)
Admission: EM | Admit: 2016-04-18 | Discharge: 2016-04-18 | Disposition: A | Discharge status: Home / Self Care | Payer: Medicare Other | Attending: Family Medicine | Admitting: Family Medicine

## 2016-04-18 ENCOUNTER — Telehealth: Payer: Self-pay | Admitting: *Deleted

## 2016-04-18 ENCOUNTER — Ambulatory Visit (HOSPITAL_COMMUNITY): Payer: Medicare Other

## 2016-04-18 ENCOUNTER — Encounter (HOSPITAL_COMMUNITY): Payer: Self-pay | Admitting: Emergency Medicine

## 2016-04-18 DIAGNOSIS — J90 Pleural effusion, not elsewhere classified: Secondary | ICD-10-CM | POA: Insufficient documentation

## 2016-04-18 DIAGNOSIS — Z85118 Personal history of other malignant neoplasm of bronchus and lung: Secondary | ICD-10-CM | POA: Diagnosis not present

## 2016-04-18 DIAGNOSIS — R05 Cough: Secondary | ICD-10-CM | POA: Diagnosis not present

## 2016-04-18 DIAGNOSIS — Z87891 Personal history of nicotine dependence: Secondary | ICD-10-CM | POA: Diagnosis not present

## 2016-04-18 DIAGNOSIS — R053 Chronic cough: Secondary | ICD-10-CM

## 2016-04-18 MED ORDER — AMOXICILLIN 500 MG PO CAPS: 500.0000 mg | ORAL_CAPSULE | Freq: Three times a day (TID) | ORAL | Status: DC

## 2016-04-18 MED ORDER — BENZONATATE 100 MG PO CAPS: 200.0000 mg | ORAL_CAPSULE | Freq: Three times a day (TID) | ORAL | Status: DC | PRN

## 2016-04-18 MED ORDER — ALBUTEROL SULFATE (2.5 MG/3ML) 0.083% IN NEBU
INHALATION_SOLUTION | RESPIRATORY_TRACT | Status: AC
  Filled 2016-04-18: qty 3

## 2016-04-18 MED ORDER — ALBUTEROL SULFATE (2.5 MG/3ML) 0.083% IN NEBU
2.5000 mg | INHALATION_SOLUTION | Freq: Once | RESPIRATORY_TRACT | Status: AC
  Administered 2016-04-18: 2.5 mg via RESPIRATORY_TRACT

## 2016-04-18 NOTE — ED Notes (Signed)
Pt has been suffering from a cough for 3 months.  Pt was seen and treated here at the end of April, but the medication he was sent home with did not help.  Pt states the cough is getting worse.  He has a headache all the time because of the coughing.  It is also causing some SOB.  Family member states that "they saw fluid on his lungs in April, and he needs to have another xray to see if it has gotten worse."

## 2016-04-18 NOTE — Telephone Encounter (Signed)
TC from pt's daughter, Maree Erie. She states her father has had increased cough and she has taken him to [facility]. She was asking if she should bring him here.  Advised daughter that since he was already @ Urgent Care-let him be seen there and then to call us back with the outcome of that visit.  Mia voiced understanding and will call back later today.

## 2016-04-18 NOTE — ED Provider Notes (Signed)
CSN: [id number]     Arrival date & time 04/18/16  1402 History   First MD Initiated Contact with Patient 04/18/16 1519     Chief Complaint  Patient presents with  . Cough   (Consider location/radiation/quality/duration/timing/severity/associated sxs/prior Treatment) HPI History obtained from patient:  Pt presents with the cc of:  Cough Duration of symptoms: Couple months ago Treatment prior to arrival: Has had antibiotics but has not follow-up with her primary care provider. Context: History of lung cancer has had a chronic cough. Last chest x-ray of April 2017 showed diffuse opacities in the lungs. These have not been followed up. Other symptoms include: Chronic cough Pain score: No pain FAMILY HISTORY: Breast cancer-Sister    Past Medical History  Diagnosis Date  . Esophagitis   . Mass of esophagus 05/28/2012    BX'D DISTAL ESOPHAGUS,PENDING  . Dysphasia     solid and liquid  . Neuropathy ([place])     right hand numbness 1 year 2012  . 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 Lung cancer ([place]) 12/27/13    right upper lung   Past Surgical History  Procedure Laterality Date  . 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 Eus  06/11/2012    Procedure: UPPER ENDOSCOPIC ULTRASOUND (EUS) RADIAL;  Surgeon: Milus Banister, MD;  [facility];  Service: Endoscopy;  Laterality: N/A;  . Complete esophagectomy  08/26/2012    Procedure: ESOPHAGECTOMY COMPLETE;  Surgeon: [name], MD;  Location: [place];  Service: Thoracic;  Laterality: N/A;  transhiatal   . Pyloroplasty  08/26/2012    Procedure: PYLOROPLASTY;  Surgeon: [name], MD;  Location: [place];  Service: Thoracic;  Laterality: N/A;  . Video bronchoscopy  08/26/2012    Procedure: VIDEO BRONCHOSCOPY;  Surgeon: [name], MD;  Location: [place];  Service: Thoracic;  Laterality: N/A;  . Jejunostomy  08/26/2012     Procedure: JEJUNOSTOMY;  Surgeon: [name], MD;  Location: [place];  Service: Thoracic;  Laterality: N/A;  placement of feeding jujunostomy tube  . Video bronchoscopy N/A 01/31/2014    Procedure: VIDEO BRONCHOSCOPY;  Surgeon: [name], MD;  Location: [facility] OR;  Service: Thoracic;  Laterality: N/A;  . Video assisted thoracoscopy (vats)/wedge resection Right 01/31/2014    Procedure: VIDEO ASSISTED THORACOSCOPY (VATS)/WEDGE RESECTION; with insertion of On Q pain pump;  Surgeon: [name], MD;  Location: [place];  Service: Thoracic;  Laterality: Right;  with insertion of On Q pain pump  . Lymph node dissection Right 01/31/2014    Procedure: LYMPH NODE DISSECTION;  Surgeon: [name], MD;  Location: [facility] OR;  Service: Thoracic;  Laterality: Right;   Family History  Problem Relation Age of Onset  . Breast cancer Sister    Social History  Substance Use Topics  . Smoking status: Former Smoker -- 0.40 packs/day for 25 years    Types: Cigarettes    Quit date: 06/19/2011  . Smokeless tobacco: Former Systems developer    Quit date: 06/03/2007  . Alcohol Use: No     Comment: not currently (01/28/14)    Review of Systems  Denies: HEADACHE, NAUSEA, ABDOMINAL PAIN, CHEST PAIN, CONGESTION, DYSURIA, SHORTNESS OF BREATH  Allergies  Review of patient's allergies indicates no known allergies.  Home Medications   Prior to Admission medications   Medication Sig Start Date End Date Taking? Authorizing Provider  pantoprazole (PROTONIX) 40 MG tablet Take 1 tablet (40 mg total) by mouth daily. 11/28/15  Yes Ladell Pier, MD  Phenylephrine-Pheniramine-DM [facility] COLD & COUGH PO) Take by mouth.   Yes Historical Provider, MD  albuterol (PROVENTIL HFA;VENTOLIN HFA) 108 (90 Base) MCG/ACT inhaler Inhale 2 puffs into the lungs every 4 (four) hours as needed for wheezing or shortness of breath. 03/13/16   Konrad Felix, PA  amoxicillin (AMOXIL) 500 MG capsule Take 1 capsule (500 mg total) by mouth  3 (three) times daily. 04/18/16   Konrad Felix, PA  azithromycin (ZITHROMAX Z-PAK) 250 MG tablet Take 2 tablets today then 1 tab days 2-5. 03/13/16   Konrad Felix, PA  benzonatate (TESSALON PERLES) 100 MG capsule Take 2 capsules (200 mg total) by mouth 3 (three) times daily as needed for cough. 04/18/16   Konrad Felix, PA  benzonatate (TESSALON) 100 MG capsule Take 1 capsule (100 mg total) by mouth every 8 (eight) hours. 03/13/16   Konrad Felix, PA   Meds Ordered and Administered this Visit   Medications  albuterol (PROVENTIL) (2.5 MG/3ML) 0.083% nebulizer solution 2.5 mg (2.5 mg Nebulization Given 04/18/16 1530)    BP 118/71 mmHg  Pulse 78  Temp(Src) 97.7 F (36.5 C) (Oral)  Resp 16  SpO2 98% No data found.   Physical Exam NURSES NOTES AND VITAL SIGNS REVIEWED. CONSTITUTIONAL: Well developed, well nourished, no acute distress HEENT: normocephalic, atraumatic EYES: Conjunctiva normal NECK:normal ROM, supple, no adenopathy PULMONARY:No respiratory distress, normal effort, diffuse bronchospasm and wheezing and cough. ABDOMINAL: Soft, ND, NT BS+, No CVAT MUSCULOSKELETAL: Normal ROM of all extremities,  SKIN: warm and dry without rash PSYCHIATRIC: Mood and affect, behavior are normal  ED Course  Procedures (including critical care time)  Labs Review Labs Reviewed - No data to display  Imaging Review Dg Chest 2 View  04/18/2016  CLINICAL DATA:  Patient with cough for 3 months. EXAM: CHEST  2 VIEW COMPARISON:  Chest radiograph 03/13/2016. FINDINGS: Stable cardiac and mediastinal contours. Slight interval increase in size of moderate right pleural effusion with increasing right basilar opacities. No pneumothorax. Hiatal hernia. Regional skeleton is unremarkable. IMPRESSION: Slight interval increase in size of loculated moderate right pleural effusion with mild interval increase in right basilar opacities. Electronically Signed   By: Lovey Newcomer M.D.   On: 04/18/2016 16:27    Reviewed and uses part of medical decision making  Visual Acuity Review  Right Eye Distance:   Left Eye Distance:   Bilateral Distance:    Right Eye Near:   Left Eye Near:    Bilateral Near:     Prescription for Tessalon Perles, amoxicillin  Patient's daughter is advised that he needs to find a primary care provider. Urgent care is not the appropriate place for him to have primary care provided to him. Happy to see him for urgent matters. Patient has long term pulmonary issues that need to be followed consistently by a primary care provider or a pulmonologist.  MDM   1. Cough, persistent     Patient is reassured that there are no issues that require transfer to higher level of care at this time or additional tests. Patient is advised to continue home symptomatic treatment. Patient is advised that if there are new or worsening symptoms to attend the emergency department, contact primary care provider, or return to UC. Instructions of care provided discharged home in stable condition.    THIS NOTE WAS GENERATED USING A VOICE  RECOGNITION SOFTWARE PROGRAM. ALL REASONABLE EFFORTS  WERE MADE TO PROOFREAD THIS DOCUMENT FOR ACCURACY.  I have verbally reviewed the discharge instructions with the patient. A printed AVS was given to the patient.  All questions were answered prior to discharge.      Konrad Felix, PA 04/18/16 2057

## 2016-04-18 NOTE — Discharge Instructions (Signed)
eff Cough, Adult A cough helps to clear your throat and lungs. A cough may last only 2-3 weeks (acute), or it may last longer than 8 weeks (chronic). Many different things can cause a cough. A cough may be a sign of an illness or another medical condition. HOME CARE  Pay attention to any changes in your cough.  Take medicines only as told by your doctor.  If you were prescribed an antibiotic medicine, take it as told by your doctor. Do not stop taking it even if you start to feel better.  Talk with your doctor before you try using a cough medicine.  Drink enough fluid to keep your pee (urine) clear or pale yellow.  If the air is dry, use a cold steam vaporizer or humidifier in your home.  Stay away from things that make you cough at work or at home.  If your cough is worse at night, try using extra pillows to raise your head up higher while you sleep.  Do not smoke, and try not to be around smoke. If you need help quitting, ask your doctor.  Do not have caffeine.  Do not drink alcohol.  Rest as needed. GET HELP IF:  You have new problems (symptoms).  You cough up yellow fluid (pus).  Your cough does not get better after 2-3 weeks, or your cough gets worse.  Medicine does not help your cough and you are not sleeping well.  You have pain that gets worse or pain that is not helped with medicine.  You have a fever.  You are losing weight and you do not know why.  You have night sweats. GET HELP RIGHT AWAY IF:  You cough up blood.  You have trouble breathing.  Your heartbeat is very fast.   This information is not intended to replace advice given to you by your health care provider. Make sure you discuss any questions you have with your health care provider.   Document Released: 07/18/2011 Document Revised: 07/26/2015 Document Reviewed: 01/11/2015 Elsevier Interactive Patient Education 2016 Elsevier Inc. Pleural Effusion A pleural effusion is an abnormal buildup  of fluid in the layers of tissue between your lungs and the inside of your chest (pleural space). These two layers of tissue that line both your lungs and the inside of your chest are called pleura. Usually, there is no air in the space between the pleura, only a thin layer of fluid. If left untreated, a large amount of fluid can build up and cause the lung to collapse. A pleural effusion is usually caused by another disease that requires treatment. The two main types of pleural effusion are:  Transudative pleural effusion. This happens when fluid leaks into the pleural space because of a low protein count in your blood or high blood pressure in your vessels. Heart failure often causes this.  Exudative infusion. This occurs when fluid collects in the pleural space from blocked blood vessels or lymph vessels. Some lung diseases, injuries, and cancers can cause this type of effusion. CAUSES Pleural effusion can be caused by:  Heart failure.  A blood clot in the lung (pulmonary embolism).  Pneumonia.  Cancer.  Liver failure (cirrhosis).  Kidney disease.  Complications from surgery, such as from open heart surgery. SIGNS AND SYMPTOMS In some cases, pleural effusion may cause no symptoms. Symptoms can include:  Shortness of breath, especially when lying down.  Chest pain, often worse when taking a deep breath.  Fever.  Dry cough that  is lasting (chronic).  Hiccups.  Rapid breathing. An underlying condition that is causing the pleural effusion (such as heart failure, pneumonia, blood clots, tuberculosis, or cancer) may also cause additional symptoms. DIAGNOSIS Your health care provider may suspect pleural effusion based on your symptoms and medical history. Your health care provider will also do a physical exam and a chest X-ray. If the X-ray shows there is fluid in your chest, you may need to have this fluid removed using a needle (thoracentesis) so it can be tested. You may also  have:  Imaging studies of the chest, such as:  Ultrasound.  CT scan.  Blood tests for kidney and liver function. TREATMENT Treatment depends on the cause of the pleural effusion. Treatment may include:  Taking antibiotic medicines to clear up an infection that is causing the pleural effusion.  Placing a tube in the chest to drain the effusion (tube thoracostomy). This procedure is often used when there is an infection in the fluid.  Surgery to remove the fibrous outer layer of tissue from the pleural space (decortication).  Thoracentesis, which can improve cough and shortness of breath.  A procedure to put medicine into the chest cavity to seal the pleural space to prevent fluid buildup (pleurodesis).  Chemotherapy and radiation therapy. These may be required in the case of cancerous (malignant) pleural effusion. HOME CARE INSTRUCTIONS  Take medicines only as directed by your health care provider.  Keep track of how long you can gently exercise before you get short of breath. Try simply walking at first.  Do not use any tobacco products, including cigarettes, chewing tobacco, or electronic cigarettes. If you need help quitting, ask your health care provider.  Keep all follow-up visits as directed by your health care provider. This is important. SEEK MEDICAL CARE IF:  The amount of time that you are able to exercise decreases or does not improve with time.  You have pain or signs of infection at the puncture site if you had thoracentesis. Watch for:  Drainage.  Redness.  Swelling.  You have a fever. SEEK IMMEDIATE MEDICAL CARE IF:  You are short of breath.  You develop chest pain.  You develop a new cough. MAKE SURE YOU:  Understand these instructions.  Will watch your condition.  Will get help right away if you are not doing well or get worse.   This information is not intended to replace advice given to you by your health care provider. Make sure you  discuss any questions you have with your health care provider.   Document Released: 11/04/2005 Document Revised: 11/25/2014 Document Reviewed: 03/30/2014 Elsevier Interactive Patient Education [employer].

## 2016-04-22 ENCOUNTER — Telehealth: Payer: Self-pay | Admitting: *Deleted

## 2016-04-22 DIAGNOSIS — C155 Malignant neoplasm of lower third of esophagus: Secondary | ICD-10-CM

## 2016-04-22 NOTE — Telephone Encounter (Signed)
Message left on patient's daughter, Mai's voicemail to call [facility] tomorrow to let us know when her Dad would be available to be seen this week and also for a CXR per Dr. Benay Spice.

## 2016-04-23 ENCOUNTER — Ambulatory Visit (HOSPITAL_BASED_OUTPATIENT_CLINIC_OR_DEPARTMENT_OTHER): Payer: Medicare Other

## 2016-04-23 ENCOUNTER — Telehealth: Payer: Self-pay | Admitting: Hematology

## 2016-04-23 ENCOUNTER — Ambulatory Visit (HOSPITAL_BASED_OUTPATIENT_CLINIC_OR_DEPARTMENT_OTHER): Payer: Medicare Other | Admitting: Nurse Practitioner

## 2016-04-23 ENCOUNTER — Telehealth: Payer: Self-pay | Admitting: Oncology

## 2016-04-23 ENCOUNTER — Ambulatory Visit (HOSPITAL_COMMUNITY)
Admission: RE | Admit: 2016-04-23 | Discharge: 2016-04-23 | Disposition: A | Discharge status: Home / Self Care | Payer: Medicare Other | Source: Ambulatory Visit | Attending: Oncology | Admitting: Oncology

## 2016-04-23 VITALS — BP 104/69 | HR 108 | Temp 97.7°F | Resp 18 | Wt 111.3 lb

## 2016-04-23 DIAGNOSIS — C155 Malignant neoplasm of lower third of esophagus: Secondary | ICD-10-CM | POA: Diagnosis not present

## 2016-04-23 DIAGNOSIS — C7801 Secondary malignant neoplasm of right lung: Secondary | ICD-10-CM

## 2016-04-23 DIAGNOSIS — C779 Secondary and unspecified malignant neoplasm of lymph node, unspecified: Secondary | ICD-10-CM

## 2016-04-23 DIAGNOSIS — J849 Interstitial pulmonary disease, unspecified: Secondary | ICD-10-CM | POA: Insufficient documentation

## 2016-04-23 DIAGNOSIS — J90 Pleural effusion, not elsewhere classified: Secondary | ICD-10-CM | POA: Insufficient documentation

## 2016-04-23 DIAGNOSIS — R062 Wheezing: Secondary | ICD-10-CM

## 2016-04-23 DIAGNOSIS — R05 Cough: Secondary | ICD-10-CM | POA: Diagnosis not present

## 2016-04-23 DIAGNOSIS — K458 Other specified abdominal hernia without obstruction or gangrene: Secondary | ICD-10-CM | POA: Diagnosis not present

## 2016-04-23 DIAGNOSIS — R0602 Shortness of breath: Secondary | ICD-10-CM

## 2016-04-23 DIAGNOSIS — C799 Secondary malignant neoplasm of unspecified site: Secondary | ICD-10-CM | POA: Diagnosis not present

## 2016-04-23 LAB — CBC WITH DIFFERENTIAL/PLATELET
BASO%: 0.3 % (ref 0.0–2.0)
BASOS ABS: 0 10*3/uL (ref 0.0–0.1)
EOS ABS: 0.2 10*3/uL (ref 0.0–0.5)
EOS%: 2 % (ref 0.0–7.0)
HEMATOCRIT: 40.1 % (ref 38.4–49.9)
HEMOGLOBIN: 13.4 g/dL (ref 13.0–17.1)
LYMPH#: 1.9 10*3/uL (ref 0.9–3.3)
LYMPH%: 23.4 % (ref 14.0–49.0)
MCH: 28.5 pg (ref 27.2–33.4)
MCHC: 33.4 g/dL (ref 32.0–36.0)
MCV: 85.1 fL (ref 79.3–98.0)
MONO#: 0.8 10*3/uL (ref 0.1–0.9)
MONO%: 10.1 % (ref 0.0–14.0)
NEUT%: 64.2 % (ref 39.0–75.0)
NEUTROS ABS: 5.1 10*3/uL (ref 1.5–6.5)
Platelets: 279 10*3/uL (ref 140–400)
RBC: 4.71 10*6/uL (ref 4.20–5.82)
RDW: 14.7 % — AB (ref 11.0–14.6)
WBC: 8 10*3/uL (ref 4.0–10.3)

## 2016-04-23 LAB — COMPREHENSIVE METABOLIC PANEL
ALT: 20 U/L (ref 0–55)
AST: 25 U/L (ref 5–34)
Albumin: 3.7 g/dL (ref 3.5–5.0)
Alkaline Phosphatase: 95 U/L (ref 40–150)
Anion Gap: 8 mEq/L (ref 3–11)
BILIRUBIN TOTAL: 0.46 mg/dL (ref 0.20–1.20)
BUN: 14.9 mg/dL (ref 7.0–26.0)
CALCIUM: 9.2 mg/dL (ref 8.4–10.4)
CO2: 25 mEq/L (ref 22–29)
Chloride: 106 mEq/L (ref 98–109)
Creatinine: 1 mg/dL (ref 0.7–1.3)
EGFR: 89 mL/min/{1.73_m2} — ABNORMAL LOW (ref >90)
Glucose: 88 mg/dl (ref 70–140)
POTASSIUM: 4.6 meq/L (ref 3.5–5.1)
Sodium: 139 mEq/L (ref 136–145)
TOTAL PROTEIN: 8 g/dL (ref 6.4–8.3)

## 2016-04-23 MED ORDER — ALBUTEROL SULFATE HFA 108 (90 BASE) MCG/ACT IN AERS: 2.0000 | INHALATION_SPRAY | RESPIRATORY_TRACT | Status: DC | PRN

## 2016-04-23 NOTE — Progress Notes (Addendum)
[facility] OFFICE PROGRESS NOTE   Diagnosis: Esophagus cancer   INTERVAL HISTORY:   Mr. Nylund returns prior to scheduled follow-up. He was seen by urgent care on 04/18/2016 with a cough. Chest x-ray showed increase in the loculated right pleural effusion. He is accompanied by his daughter. She reports a persistent cough both "day and night" for the past few months. He becomes short of breath with coughing. He is wheezing. No fever. Appetite has been decreased. He has lost some weight. He had an inhaler for the wheezing. The inhaler helped but he did not have any refills. He tried [employer] for the cough with no improvement.  Objective:  Vital signs in last 24 hours:  Blood pressure 104/69, pulse 108, temperature 97.7 F (36.5 C), temperature source Oral, resp. rate 18, weight 111 lb 4.8 oz (50.485 kg), SpO2 97 %. Repeat heart rate 88   He is in no acute distress. HEENT: No thrush or ulcers. Lymphatics: No palpable cervical or supra-clavicular lymph nodes. Resp: Breath sounds diminished throughout the right lung field, scattered wheezes. No respiratory distress. Cardio: Regular rate and rhythm. GI: No hepatomegaly. Vascular: No leg edema.  Lab Results:  Lab Results  Component Value Date   WBC 8.9 07/29/2014   HGB 12.8* 07/29/2014   HCT 39.9 07/29/2014   MCV 85.5 07/29/2014   PLT 321 07/29/2014   NEUTROABS 5.9 07/29/2014    Imaging:  Dg Chest 2 View  04/23/2016  CLINICAL DATA:  Cough, shortness of breath, history of esophageal carcinoma EXAM: CHEST  2 VIEW COMPARISON:  Chest x-ray of 04/18/2016, 11/28/2015, 08/28/2015, and CT chest of 12/23/2014 FINDINGS: There has been a slight interval increase in size of the right pleural effusion since the chest x-ray of 04/18/2016. This slightly larger effusion creates a concave appearance to the aerated right lung base. Changes of gastric pull-through are noted, and there does appear to be herniation of a loop of  bowel possibly mid transverse colon as noted on the prior CT. Chronic interstitial lung disease appears stable. Mediastinal and hilar contours are unremarkable. Mild cardiomegaly is stable. No bony abnormality is seen. IMPRESSION: 1. Slight increase in volume of the right pleural effusion. 2. Stable chronic interstitial lung disease. 3. Stable herniation of what appears to be the mid transverse colon in this patient has undergone gastric pull-through for esophageal carcinoma. Electronically Signed   By: Ivar Drape M.D.   On: 04/23/2016 13:42    Medications: I have reviewed the patient's current medications.  Assessment/Plan: 1. Squamous cell carcinoma. Staging CT scans of the chest, abdomen and pelvis on 06/02/2012 negative for evidence of metastatic disease. Endoscopic ultrasound 06/11/2012 confirmed a uT3 uN1 tumor at 40 cm from the incisors. He began radiation 06/15/2012. He began weekly Taxol/carboplatin chemotherapy 06/16/2012. He completed 6 weekly chemotherapy treatments. The carboplatin was held with week 5 due to thrombocytopenia. He completed the sixth weekly treatment with Taxol and carboplatin on 07/23/2012. He completed radiation on 07/29/2012 -he underwent an esophagectomy on 08/26/2012 with the pathology confirming a ypT3,ypN0 squamous cell carcinoma with perineural invasion identified.  2. History of solid/liquid dysphagia secondary to the obstructing esophagus mass.  3. Reflux. He is taking Protonix. 4. Hospitalization 07/19/2014 with abdominal pain and vomiting. CT scan showed a mid small bowel obstruction without discrete mass or evident etiology. Small bowel obstruction felt to be secondary to adhesions. 5. Renal failure during the 07/19/2014 hospitalization felt to be contrast-induced nephropathy. 6. Chest CT 06/15/2013 with postoperative changes from  esophagectomy and gastric pull-through. 7 mm posterior subpleural nodule in the right lower lobe. Followup CT scan 12/16/2013  revealed an increase in the size of a right hilar node and right upper lobe nodule.  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  Chest CT 12/23/2014 with enlarged right hilar lymph node measuring 1.2 cm, previously 0.8 cm. Slight enlargement of a periaortic lymph node, 8 mm as compared to 7 mm on the previous study. Increased thickening of the right pleural rind. Moderate sized right pleural effusion.  Chest x-ray 11/28/2015, no change in right pleural effusion  Chest x-ray 04/18/2016 with increase in the right pleural effusion. 7. Headaches. Negative brain CT 12/23/2014.   Disposition: Mr. Enyeart is seen today prior to scheduled follow-up. He has a right pleural effusion which has increased over the past few months. He is symptomatic with a cough and shortness of breath. We referred him for a diagnostic/therapeutic thoracentesis with fluid to be sent for cytology. We also referred him for a chest CT. I sent a prescription to his pharmacy for an albuterol inhaler.  He will return for a follow-up visit on 04/26/2016.   Patient seen with Dr. Benay Spice. 25 minutes were spent face-to-face at today's visit with the majority of that time involved in counseling/coordination of care.   Ned Card ANP/GNP-BC   04/23/2016  2:54 PM  This was a shared visit  with Ned Card. Mr. Fieldhouse was interviewed and examined. We reviewed the x-ray images with the patient and his daughter. He appears symptomatic from the right pleural effusion. We will arrange for a diagnostic and therapeutic right thoracentesis.  He will return for an office visit on 04/26/2016.  Julieanne Manson, M.D.

## 2016-04-23 NOTE — Telephone Encounter (Signed)
spoke w/ pt daughter confirmed 6/6 apt , pt will get x ray before  apt

## 2016-04-23 NOTE — Telephone Encounter (Signed)
per pof to schpt appt-sent back to lab-gave pt copy of avs °

## 2016-04-24 ENCOUNTER — Ambulatory Visit (HOSPITAL_COMMUNITY)
Admission: RE | Admit: 2016-04-24 | Discharge: 2016-04-24 | Disposition: A | Discharge status: Home / Self Care | Payer: Medicare Other | Source: Ambulatory Visit | Attending: Nurse Practitioner | Admitting: Nurse Practitioner

## 2016-04-24 ENCOUNTER — Telehealth: Payer: Self-pay | Admitting: Oncology

## 2016-04-24 ENCOUNTER — Telehealth: Payer: Self-pay

## 2016-04-24 ENCOUNTER — Ambulatory Visit (HOSPITAL_COMMUNITY)
Admission: RE | Admit: 2016-04-24 | Discharge: 2016-04-24 | Disposition: A | Discharge status: Home / Self Care | Payer: Medicare Other | Source: Ambulatory Visit | Attending: Radiology | Admitting: Radiology

## 2016-04-24 DIAGNOSIS — C155 Malignant neoplasm of lower third of esophagus: Secondary | ICD-10-CM | POA: Insufficient documentation

## 2016-04-24 DIAGNOSIS — C7801 Secondary malignant neoplasm of right lung: Secondary | ICD-10-CM | POA: Diagnosis not present

## 2016-04-24 DIAGNOSIS — J90 Pleural effusion, not elsewhere classified: Secondary | ICD-10-CM | POA: Insufficient documentation

## 2016-04-24 DIAGNOSIS — Z9889 Other specified postprocedural states: Secondary | ICD-10-CM | POA: Insufficient documentation

## 2016-04-24 DIAGNOSIS — J948 Other specified pleural conditions: Secondary | ICD-10-CM | POA: Diagnosis not present

## 2016-04-24 MED ORDER — LIDOCAINE HCL (PF) 1 % IJ SOLN
INTRAMUSCULAR | Status: AC
  Filled 2016-04-24: qty 10

## 2016-04-24 MED ORDER — IOPAMIDOL (ISOVUE-300) INJECTION 61%
75.0000 mL | Freq: Once | INTRAVENOUS | Status: AC | PRN
  Administered 2016-04-24: 75 mL via INTRAVENOUS

## 2016-04-24 NOTE — Telephone Encounter (Signed)
Called patients daughter and confirmed appointments for today. Patient to be at [facility] for thoracentesis at 145 and CT to follow.Patients daughter verbalized understanding and denies any questions or concerns.

## 2016-04-24 NOTE — Telephone Encounter (Signed)
-----   Message from Owens Shark, NP sent at 04/23/2016  4:45 PM EDT ----- Please call his daughter at [phone] to confirm appointment times for tomorrow. Thanks

## 2016-04-24 NOTE — Telephone Encounter (Signed)
cld pt daughter and spoke to pt daughter Fernande Boyden and adv appt moved to 8:30/daughter understood

## 2016-04-24 NOTE — Procedures (Signed)
   US guided Rt thoracentesis  VERY loculated effusion 25 cc bloody fluid obtained and sent for labs  Pt tolerated well  cxr pending

## 2016-04-24 NOTE — Procedures (Signed)
   US guided Rt thora  Very loculated pleural eff 25 cc bloody fluid obtained  cxr pending  Tolerated well

## 2016-04-26 ENCOUNTER — Ambulatory Visit: Payer: Medicare Other | Admitting: Oncology

## 2016-04-26 ENCOUNTER — Ambulatory Visit: Payer: Medicare Other | Admitting: Nurse Practitioner

## 2016-04-26 ENCOUNTER — Telehealth: Payer: Self-pay | Admitting: *Deleted

## 2016-04-26 ENCOUNTER — Telehealth: Payer: Self-pay | Admitting: Oncology

## 2016-04-26 ENCOUNTER — Ambulatory Visit (HOSPITAL_BASED_OUTPATIENT_CLINIC_OR_DEPARTMENT_OTHER): Payer: Medicare Other | Admitting: Oncology

## 2016-04-26 VITALS — BP 115/73 | HR 91 | Temp 98.0°F | Resp 18 | Wt 111.6 lb

## 2016-04-26 DIAGNOSIS — R0609 Other forms of dyspnea: Secondary | ICD-10-CM | POA: Diagnosis not present

## 2016-04-26 DIAGNOSIS — C7801 Secondary malignant neoplasm of right lung: Secondary | ICD-10-CM

## 2016-04-26 DIAGNOSIS — C155 Malignant neoplasm of lower third of esophagus: Secondary | ICD-10-CM

## 2016-04-26 DIAGNOSIS — R05 Cough: Secondary | ICD-10-CM | POA: Diagnosis not present

## 2016-04-26 DIAGNOSIS — J90 Pleural effusion, not elsewhere classified: Secondary | ICD-10-CM

## 2016-04-26 DIAGNOSIS — C779 Secondary and unspecified malignant neoplasm of lymph node, unspecified: Secondary | ICD-10-CM | POA: Diagnosis not present

## 2016-04-26 MED ORDER — ALBUTEROL SULFATE HFA 108 (90 BASE) MCG/ACT IN AERS: 2.0000 | INHALATION_SPRAY | RESPIRATORY_TRACT | Status: DC | PRN

## 2016-04-26 MED ORDER — BENZONATATE 100 MG PO CAPS: 200.0000 mg | ORAL_CAPSULE | Freq: Three times a day (TID) | ORAL | Status: DC | PRN

## 2016-04-26 NOTE — Telephone Encounter (Signed)
Per staff message and POF I have scheduled appts. Advised scheduler of appts. JMW  

## 2016-04-26 NOTE — Telephone Encounter (Signed)
gave and printed appt sched and avs for pt for June and july

## 2016-04-26 NOTE — Progress Notes (Signed)
[name] OFF[facility] PROGRESS NOTE   Diagnosis: Esophagus cancer  INTERVAL HISTORY:   [name] returns as scheduled. He underwent a thoracentesis 04/24/2016. Only 25 mL of fluid were obtained. He continues to have a cough and exertional dyspnea. No other complaint. The cytology from the pleural fluid revealed no malignant cells.  Objective:  Vital signs in last 24 hours:  Blood pressure 115/73, pulse 91, temperature 98 F (36.7 C), temperature source Oral, resp. rate 18, weight 111 lb 9.6 oz (50.621 kg), SpO2 98 %.   Resp: Decreased breath sounds throughout the right chest, no respiratory distress, mild wheezing Cardio: Regular rate and rhythm GI: No hepatomegaly Vascular: No leg edema   Lab Results:  Lab Results  Component Value Date   WBC 8.0 04/23/2016   HGB 13.4 04/23/2016   HCT 40.1 04/23/2016   MCV 85.1 04/23/2016   PLT 279 04/23/2016   NEUTROABS 5.1 04/23/2016      Imaging:  Dg Chest 1 View  04/24/2016  CLINICAL DATA:  Status post right thoracentesis today. Question pneumothorax. History of esophageal carcinoma. EXAM: CHEST 1 VIEW COMPARISON:  PA and lateral chest 04/23/2016. FINDINGS: Right pleural effusion persists but appears somewhat decreased since the thoracentesis. No pneumothorax is identified. Mediastinal lymphadenopathy is seen with thickening of the right paratracheal stripe identified. Mild prominence pulmonary interstitium is again seen. The patient is status post gastric pull-through. Heart size is normal. IMPRESSION: Some decrease in a right pleural effusion after thoracentesis. Negative for pneumothorax or other new abnormality. Electronically Signed   By: Inge Rise M.D.   On: 04/24/2016 15:58   Dg Chest 2 View  04/23/2016  CLINICAL DATA:  Cough, shortness of breath, history of esophageal carcinoma EXAM: CHEST  2 VIEW COMPARISON:  Chest x-ray of 04/18/2016, 11/28/2015, 08/28/2015, and CT chest of 12/23/2014 FINDINGS: There has  been a slight interval increase in size of the right pleural effusion since the chest x-ray of 04/18/2016. This slightly larger effusion creates a concave appearance to the aerated right lung base. Changes of gastric pull-through are noted, and there does appear to be herniation of a loop of bowel possibly mid transverse colon as noted on the prior CT. Chronic interstitial lung disease appears stable. Mediastinal and hilar contours are unremarkable. Mild cardiomegaly is stable. No bony abnormality is seen. IMPRESSION: 1. Slight increase in volume of the right pleural effusion. 2. Stable chronic interstitial lung disease. 3. Stable herniation of what appears to be the mid transverse colon in this patient has undergone gastric pull-through for esophageal carcinoma. Electronically Signed   By: Ivar Drape M.D.   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 EXAM: CT CHEST WITH CONTRAST TECHNIQUE: Multidetector CT imaging of the chest was performed during intravenous contrast administration. CONTRAST:  79m ISOVUE-300 IOPAMIDOL (ISOVUE-300) INJECTION 61% COMPARISON:  12/23/2014 chest CT. Chest radiograph from earlier today. FINDINGS: Mediastinum/Nodes: Normal heart size. Trace pericardial fluid/thickening. Great vessels are normal in course and caliber. No central pulmonary emboli. Normal visualized thyroid. Status post esophagectomy with gastric pull-through. Intact appearing esophagogastric anastomosis in the lower neck, without appreciable anastomotic wall thickening. No axillary adenopathy. There is bulky confluent right paratracheal adenopathy measuring up to 2.7 cm (series 2/ image 46), which is  new  since 12/23/2014. There is new bulky left lower paratracheal/AP window adenopathy measuring up to 2.6 cm (series 2/ image 59). There is new bulky left prevascular mediastinal adenopathy measuring up to 2.4 cm (series 2/ image 50). There is a partially necrotic new 2.3 cm enlarged subcarinal node (series 2/ image 74). There is infiltrative right hilar adenopathy measuring up to 1.5 cm (series 2/image 67), increased from 1.2 cm. No mild left hilar adenopathy measuring up to 1.3 cm (series 2/image 72). There is an enlarged 1.2 cm left posterior mediastinal node adjacent to the left posterior descending thoracic aorta (series 2/image 99), increased from 0.8 cm. There is a new 2.1 cm enlarged left level 4 neck node (series 2/image 19). There is right level 4 neck adenopathy measuring up to 1.1 cm (series 2/image 10). There is extrinsic narrowing of the distal right pulmonary artery by the confluent right hilar/right paratracheal adenopathy. Lungs/Pleura: No pneumothorax. Moderate right pleural effusion with diffuse irregular pleural thickening and enhancement in the right pleural space measuring up to 9 mm in thickness. The right pleural effusion is of mixed density suggesting clot, tumor and/or debris. No left pleural effusion. Mild paraseptal emphysema. There is moderate compressive atelectasis in the mid to lower right lung. There is increased peribronchovascular interstitial thickening in the central to lower right lung, suspicious for lymphangitic tumor. There is underlying patchy subpleural reticulation and mild traction bronchiolectasis throughout both lungs. There is patchy ground-glass attenuation throughout the mid to lower lungs bilaterally, increased. A subpleural 5 mm left upper lobe pulmonary nodule (series 3/image 53) appears new. Upper abdomen: Unremarkable. Musculoskeletal:  No aggressive appearing focal osseous lesions. IMPRESSION: 1. Significant interval progression of metastatic bilateral lower neck,  mediastinal (bilateral paratracheal, AP window, subcarinal and prevascular) and bilateral hilar lymphadenopathy. 2. New extrinsic narrowing of the distal right pulmonary artery by the infiltrative confluent right hilar/right paratracheal adenopathy. 3. Moderate right pleural effusion, probably malignant given the diffuse irregular pleural thickening and enhancement in the right pleural space. Mixed density throughout the right pleural effusion suggesting clot, tumor and/or debris. Moderate compressive atelectasis in the mid to lower right lung. 4. Increased peribronchovascular interstitial thickening in the central to lower right lung, suspicious for lymphangitic tumor superimposed on chronic interstitial lung disease characterized by patchy subpleural reticulation and mild traction bronchiolectasis. 5. Worsened patchy ground-glass attenuation throughout the mid to lower lungs bilaterally, which could represent pulmonary edema and/or alveolar hemorrhage. 6. New 5 mm left upper lobe pulmonary nodule, metastasis not excluded. Electronically Signed   By: Ilona Sorrel M.D.   On: 04/24/2016 16:08   US Thoracentesis Asp Pleural Space W/img Guide  04/24/2016  INDICATION: Symptomatic right sided loculated pleural effusion EXAM: US THORACENTESIS ASP PLEURAL SPACE W/IMG GUIDE COMPARISON:  None. MEDICATIONS: 10 cc 1% lidocaine COMPLICATIONS: None immediate. TECHNIQUE: Informed written consent was obtained from the patient after a discussion of the risks, benefits and alternatives to treatment. A timeout was performed prior to the initiation of the procedure. Initial ultrasound scanning demonstrates a This is default text pleural effusion. The lower chest was prepped and draped in the usual sterile fashion. 1% lidocaine was used for local anesthesia. Under direct ultrasound guidance, a 19 gauge, 7-cm, Yueh catheter was introduced. An ultrasound image was saved for documentation purposes. The thoracentesis was performed. The  catheter was removed and a dressing was applied. The patient tolerated the procedure well without immediate post procedural complication. The patient was escorted to have an upright chest radiograph. FINDINGS: A total  of approximately 25 cc of bloody fluid was removed. Requested samples were sent to the laboratory. IMPRESSION: Successful ultrasound-guided right sided thoracentesis yielding 25 cc of pleural fluid. Read by:  Lavonia Drafts [facility] Electronically Signed   By: Jerilynn Mages.  Shick M.D.   On: 04/24/2016 15:14    Medications: I have reviewed the patient's current medications.  Assessment/Plan: 1. Squamous cell carcinoma. Staging CT scans of the chest, abdomen and pelvis on 06/02/2012 negative for evidence of metastatic disease. Endoscopic ultrasound 06/11/2012 confirmed a uT3 uN1 tumor at 40 cm from the incisors. He began radiation 06/15/2012. He began weekly Taxol/carboplatin chemotherapy 06/16/2012. He completed 6 weekly chemotherapy treatments. The carboplatin was held with week 5 due to thrombocytopenia. He completed the sixth weekly treatment with Taxol and carboplatin on 07/23/2012. He completed radiation on 07/29/2012 -he underwent an esophagectomy on 08/26/2012 with the pathology confirming a ypT3,ypN0 squamous cell carcinoma with perineural invasion identified.  2. History of solid/liquid dysphagia secondary to the obstructing esophagus mass.  3. Reflux. He is taking Protonix. 4. Hospitalization 07/19/2014 with abdominal pain and vomiting. CT scan showed a mid small bowel obstruction without discrete mass or evident etiology. Small bowel obstruction felt to be secondary to adhesions. 5. Renal failure during the 07/19/2014 hospitalization felt to be contrast-induced nephropathy. 6. Chest CT 06/15/2013 with postoperative changes from esophagectomy and gastric pull-through. 7 mm posterior subpleural nodule in the right lower lobe. Followup CT scan 12/16/2013 revealed an increase in the size of a  right hilar node and right upper lobe nodule.  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  Chest CT 12/23/2014 with enlarged right hilar lymph node measuring 1.2 cm, previously 0.8 cm. Slight enlargement of a periaortic lymph node, 8 mm as compared to 7 mm on the previous study. Increased thickening of the right pleural rind. Moderate sized right pleural effusion.  Chest x-ray 11/28/2015, no change in right pleural effusion  Chest x-ray 04/18/2016 with increase in the right pleural effusion.  Thoracentesis 04/24/2016-only 25 mL of pleural fluid could be obtained  CT chest 04/24/2016-progressive mediastinal/hilar lymphadenopathy, large right pleural effusion with mixed density, interstitial thickening in the right lower lung concerning for lymphatic tumor spread 7.  Headaches. Negative brain CT 12/23/2014.   Disposition:  [name] has clinical and x-ray evidence of disease progression. I discussed the CT findings with him today with the aid of a friend interpreter. I reviewed the images with Dr. Servando Snare. Interventional radiology will review the images to see if he may be a candidate for an image guided chest tube placement. I suspect the fluid in the right pleural space is involved with hemorrhage and tumor. This may require a decortication procedure  to free the right lung. His symptoms are also related to tumor and the mediastinum and parenchymal lung changes.  The plan is to proceed with a trial of salvage systemic therapy. He will begin treatment with weekly Taxol/carboplatin. He tolerated  this well in the past. We reviewed potential toxicities associated with Taxol and carboplatin regimen including the chance for hematologic toxicity, alopecia, an allergic reaction, and neuropathy. He agrees to proceed. He will begin a first week of chemotherapy on 04/30/2016. He will return for an office visit on 05/07/2016. We refilled prescriptions for Tessalon and an inhaler today. He will contact us for increased dyspnea.  Betsy Coder, MD  04/26/2016  8:45 AM

## 2016-04-28 ENCOUNTER — Other Ambulatory Visit: Payer: Self-pay | Admitting: Oncology

## 2016-04-30 ENCOUNTER — Ambulatory Visit (HOSPITAL_BASED_OUTPATIENT_CLINIC_OR_DEPARTMENT_OTHER): Payer: Medicare Other

## 2016-04-30 VITALS — BP 118/82 | HR 100 | Temp 97.2°F | Resp 18

## 2016-04-30 DIAGNOSIS — C155 Malignant neoplasm of lower third of esophagus: Secondary | ICD-10-CM

## 2016-04-30 DIAGNOSIS — C7801 Secondary malignant neoplasm of right lung: Secondary | ICD-10-CM

## 2016-04-30 DIAGNOSIS — C779 Secondary and unspecified malignant neoplasm of lymph node, unspecified: Secondary | ICD-10-CM

## 2016-04-30 DIAGNOSIS — Z5111 Encounter for antineoplastic chemotherapy: Secondary | ICD-10-CM

## 2016-04-30 MED ORDER — PALONOSETRON HCL INJECTION 0.25 MG/5ML
0.2500 mg | Freq: Once | INTRAVENOUS | Status: AC
  Administered 2016-04-30: 0.25 mg via INTRAVENOUS

## 2016-04-30 MED ORDER — FAMOTIDINE IN NACL 20-0.9 MG/50ML-% IV SOLN
20.0000 mg | Freq: Once | INTRAVENOUS | Status: AC
Start: 2016-04-30 — End: 2016-04-30
  Administered 2016-04-30: 20 mg via INTRAVENOUS

## 2016-04-30 MED ORDER — DIPHENHYDRAMINE HCL 50 MG/ML IJ SOLN
INTRAMUSCULAR | Status: AC
  Filled 2016-04-30: qty 1

## 2016-04-30 MED ORDER — DEXAMETHASONE SODIUM PHOSPHATE 100 MG/10ML IJ SOLN
10.0000 mg | Freq: Once | INTRAMUSCULAR | Status: AC
  Administered 2016-04-30: 10 mg via INTRAVENOUS
  Filled 2016-04-30: qty 1

## 2016-04-30 MED ORDER — FAMOTIDINE IN NACL 20-0.9 MG/50ML-% IV SOLN
INTRAVENOUS | Status: AC
  Filled 2016-04-30: qty 50

## 2016-04-30 MED ORDER — PALONOSETRON HCL INJECTION 0.25 MG/5ML
INTRAVENOUS | Status: AC
  Filled 2016-04-30: qty 5

## 2016-04-30 MED ORDER — SODIUM CHLORIDE 0.9 % IV SOLN
Freq: Once | INTRAVENOUS | Status: AC
  Administered 2016-04-30: 09:00:00 via INTRAVENOUS

## 2016-04-30 MED ORDER — SODIUM CHLORIDE 0.9 % IV SOLN
172.2000 mg | Freq: Once | INTRAVENOUS | Status: AC
  Administered 2016-04-30: 170 mg via INTRAVENOUS
  Filled 2016-04-30: qty 17

## 2016-04-30 MED ORDER — PROCHLORPERAZINE MALEATE 10 MG PO TABS: 10.0000 mg | ORAL_TABLET | Freq: Four times a day (QID) | ORAL | Status: DC | PRN

## 2016-04-30 MED ORDER — DIPHENHYDRAMINE HCL 50 MG/ML IJ SOLN
25.0000 mg | Freq: Once | INTRAMUSCULAR | Status: AC
  Administered 2016-04-30: 25 mg via INTRAVENOUS

## 2016-04-30 MED ORDER — SODIUM CHLORIDE 0.9 % IV SOLN
80.0000 mg/m2 | Freq: Once | INTRAVENOUS | Status: AC
  Administered 2016-04-30: 120 mg via INTRAVENOUS
  Filled 2016-04-30: qty 20

## 2016-04-30 NOTE — Patient Instructions (Signed)
[facility] Discharge Instructions for Patients Receiving Chemotherapy  Today you received the following chemotherapy agents: Taxol and Carboplatin.  To help prevent nausea and vomiting after your treatment, we encourage you to take your nausea medication as directed.   If you develop nausea and vomiting that is not controlled by your nausea medication, call the clinic.   BELOW ARE SYMPTOMS THAT SHOULD BE REPORTED IMMEDIATELY:  *FEVER GREATER THAN 100.5 F  *CHILLS WITH OR WITHOUT FEVER  NAUSEA AND VOMITING THAT IS NOT CONTROLLED WITH YOUR NAUSEA MEDICATION  *UNUSUAL SHORTNESS OF BREATH  *UNUSUAL BRUISING OR BLEEDING  TENDERNESS IN MOUTH AND THROAT WITH OR WITHOUT PRESENCE OF ULCERS  *URINARY PROBLEMS  *BOWEL PROBLEMS  UNUSUAL RASH Items with * indicate a potential emergency and should be followed up as soon as possible.  Feel free to call the clinic you have any questions or concerns. The clinic phone number is [phone].  Please show the [facility] at check-in to the Emergency Department and triage nurse. Paclitaxel injection What is this medicine? PACLITAXEL (PAK li TAX el) is a chemotherapy drug. It targets fast dividing cells, like cancer cells, and causes these cells to die. This medicine is used to treat ovarian cancer, breast cancer, and other cancers. This medicine may be used for other purposes; ask your health care provider or pharmacist if you have questions. What should I tell my health care provider before I take this medicine? They need to know if you have any of these conditions: -blood disorders -irregular heartbeat -infection (especially a virus infection such as chickenpox, cold sores, or herpes) -liver disease -previous or ongoing radiation therapy -an unusual or allergic reaction to paclitaxel, alcohol, polyoxyethylated castor oil, other chemotherapy agents, other medicines, foods, dyes, or preservatives -pregnant or trying to  get pregnant -breast-feeding How should I use this medicine? This drug is given as an infusion into a vein. It is administered in a hospital or clinic by a specially trained health care professional. Talk to your pediatrician regarding the use of this medicine in children. Special care may be needed. Overdosage: If you think you have taken too much of this medicine contact a poison control center or emergency room at once. NOTE: This medicine is only for you. Do not share this medicine with others. What if I miss a dose? It is important not to miss your dose. Call your doctor or health care professional if you are unable to keep an appointment. What may interact with this medicine? Do not take this medicine with any of the following medications: -disulfiram -metronidazole This medicine may also interact with the following medications: -cyclosporine -diazepam -ketoconazole -medicines to increase blood counts like filgrastim, pegfilgrastim, sargramostim -other chemotherapy drugs like cisplatin, doxorubicin, epirubicin, etoposide, teniposide, vincristine -quinidine -testosterone -vaccines -verapamil Talk to your doctor or health care professional before taking any of these medicines: -acetaminophen -aspirin -ibuprofen -ketoprofen -naproxen This list may not describe all possible interactions. Give your health care provider a list of all the medicines, herbs, non-prescription drugs, or dietary supplements you use. Also tell them if you smoke, drink alcohol, or use illegal drugs. Some items may interact with your medicine. What should I watch for while using this medicine? Your condition will be monitored carefully while you are receiving this medicine. You will need important blood work done while you are taking this medicine. This drug may make you feel generally unwell. This is not uncommon, as chemotherapy can affect healthy cells as well as  cancer cells. Report any side effects.  Continue your course of treatment even though you feel ill unless your doctor tells you to stop. This medicine can cause serious allergic reactions. To reduce your risk you will need to take other medicine(s) before treatment with this medicine. In some cases, you may be given additional medicines to help with side effects. Follow all directions for their use. Call your doctor or health care professional for advice if you get a fever, chills or sore throat, or other symptoms of a cold or flu. Do not treat yourself. This drug decreases your body's ability to fight infections. Try to avoid being around people who are sick. This medicine may increase your risk to bruise or bleed. Call your doctor or health care professional if you notice any unusual bleeding. Be careful brushing and flossing your teeth or using a toothpick because you may get an infection or bleed more easily. If you have any dental work done, tell your dentist you are receiving this medicine. Avoid taking products that contain aspirin, acetaminophen, ibuprofen, naproxen, or ketoprofen unless instructed by your doctor. These medicines may hide a fever. Do not become pregnant while taking this medicine. Women should inform their doctor if they wish to become pregnant or think they might be pregnant. There is a potential for serious side effects to an unborn child. Talk to your health care professional or pharmacist for more information. Do not breast-feed an infant while taking this medicine. Men are advised not to father a child while receiving this medicine. This product may contain alcohol. Ask your pharmacist or healthcare provider if this medicine contains alcohol. Be sure to tell all healthcare providers you are taking this medicine. Certain medicines, like metronidazole and disulfiram, can cause an unpleasant reaction when taken with alcohol. The reaction includes flushing, headache, nausea, vomiting, sweating, and increased thirst. The  reaction can last from 30 minutes to several hours. What side effects may I notice from receiving this medicine? Side effects that you should report to your doctor or health care professional as soon as possible: -allergic reactions like skin rash, itching or hives, swelling of the face, lips, or tongue -low blood counts - This drug may decrease the number of white blood cells, red blood cells and platelets. You may be at increased risk for infections and bleeding. -signs of infection - fever or chills, cough, sore throat, pain or difficulty passing urine -signs of decreased platelets or bleeding - bruising, pinpoint red spots on the skin, black, tarry stools, nosebleeds -signs of decreased red blood cells - unusually weak or tired, fainting spells, lightheadedness -breathing problems -chest pain -high or low blood pressure -mouth sores -nausea and vomiting -pain, swelling, redness or irritation at the injection site -pain, tingling, numbness in the hands or feet -slow or irregular heartbeat -swelling of the ankle, feet, hands Side effects that usually do not require medical attention (report to your doctor or health care professional if they continue or are bothersome): -bone pain -complete hair loss including hair on your head, underarms, pubic hair, eyebrows, and eyelashes -changes in the color of fingernails -diarrhea -loosening of the fingernails -loss of appetite -muscle or joint pain -red flush to skin -sweating This list may not describe all possible side effects. Call your doctor for medical advice about side effects. You may report side effects to FDA at 1-800-FDA-1088. Where should I keep my medicine? This drug is given in a hospital or clinic and will not be stored at home.  NOTE: This sheet is a summary. It may not cover all possible information. If you have questions about this medicine, talk to your doctor, pharmacist, or health care provider.    2016, [employer]vier/Gold  Standard. (2015-06-22 13:02:56) Carboplatin injection What is this medicine? CARBOPLATIN (KAR boe pla tin) is a chemotherapy drug. It targets fast dividing cells, like cancer cells, and causes these cells to die. This medicine is used to treat ovarian cancer and many other cancers. This medicine may be used for other purposes; ask your health care provider or pharmacist if you have questions. What should I tell my health care provider before I take this medicine? They need to know if you have any of these conditions: -blood disorders -hearing problems -kidney disease -recent or ongoing radiation therapy -an unusual or allergic reaction to carboplatin, cisplatin, other chemotherapy, other medicines, foods, dyes, or preservatives -pregnant or trying to get pregnant -breast-feeding How should I use this medicine? This drug is usually given as an infusion into a vein. It is administered in a hospital or clinic by a specially trained health care professional. Talk to your pediatrician regarding the use of this medicine in children. Special care may be needed. Overdosage: If you think you have taken too much of this medicine contact a poison control center or emergency room at once. NOTE: This medicine is only for you. Do not share this medicine with others. What if I miss a dose? It is important not to miss a dose. Call your doctor or health care professional if you are unable to keep an appointment. What may interact with this medicine? -medicines for seizures -medicines to increase blood counts like filgrastim, pegfilgrastim, sargramostim -some antibiotics like amikacin, gentamicin, neomycin, streptomycin, tobramycin -vaccines Talk to your doctor or health care professional before taking any of these medicines: -acetaminophen -aspirin -ibuprofen -ketoprofen -naproxen This list may not describe all possible interactions. Give your health care provider a list of all the medicines, herbs,  non-prescription drugs, or dietary supplements you use. Also tell them if you smoke, drink alcohol, or use illegal drugs. Some items may interact with your medicine. What should I watch for while using this medicine? Your condition will be monitored carefully while you are receiving this medicine. You will need important blood work done while you are taking this medicine. This drug may make you feel generally unwell. This is not uncommon, as chemotherapy can affect healthy cells as well as cancer cells. Report any side effects. Continue your course of treatment even though you feel ill unless your doctor tells you to stop. In some cases, you may be given additional medicines to help with side effects. Follow all directions for their use. Call your doctor or health care professional for advice if you get a fever, chills or sore throat, or other symptoms of a cold or flu. Do not treat yourself. This drug decreases your body's ability to fight infections. Try to avoid being around people who are sick. This medicine may increase your risk to bruise or bleed. Call your doctor or health care professional if you notice any unusual bleeding. Be careful brushing and flossing your teeth or using a toothpick because you may get an infection or bleed more easily. If you have any dental work done, tell your dentist you are receiving this medicine. Avoid taking products that contain aspirin, acetaminophen, ibuprofen, naproxen, or ketoprofen unless instructed by your doctor. These medicines may hide a fever. Do not become pregnant while taking this medicine. Women should  inform their doctor if they wish to become pregnant or think they might be pregnant. There is a potential for serious side effects to an unborn child. Talk to your health care professional or pharmacist for more information. Do not breast-feed an infant while taking this medicine. What side effects may I notice from receiving this medicine? Side effects  that you should report to your doctor or health care professional as soon as possible: -allergic reactions like skin rash, itching or hives, swelling of the face, lips, or tongue -signs of infection - fever or chills, cough, sore throat, pain or difficulty passing urine -signs of decreased platelets or bleeding - bruising, pinpoint red spots on the skin, black, tarry stools, nosebleeds -signs of decreased red blood cells - unusually weak or tired, fainting spells, lightheadedness -breathing problems -changes in hearing -changes in vision -chest pain -high blood pressure -low blood counts - This drug may decrease the number of white blood cells, red blood cells and platelets. You may be at increased risk for infections and bleeding. -nausea and vomiting -pain, swelling, redness or irritation at the injection site -pain, tingling, numbness in the hands or feet -problems with balance, talking, walking -trouble passing urine or change in the amount of urine Side effects that usually do not require medical attention (report to your doctor or health care professional if they continue or are bothersome): -hair loss -loss of appetite -metallic taste in the mouth or changes in taste This list may not describe all possible side effects. Call your doctor for medical advice about side effects. You may report side effects to FDA at 1-800-FDA-1088. Where should I keep my medicine? This drug is given in a hospital or clinic and will not be stored at home. NOTE: This sheet is a summary. It may not cover all possible information. If you have questions about this medicine, talk to your doctor, pharmacist, or health care provider.    2016, [employer]vier/Gold Standard. (2008-02-09 14:38:05)

## 2016-05-05 ENCOUNTER — Other Ambulatory Visit: Payer: Self-pay | Admitting: Oncology

## 2016-05-07 ENCOUNTER — Other Ambulatory Visit (HOSPITAL_BASED_OUTPATIENT_CLINIC_OR_DEPARTMENT_OTHER): Payer: Medicare Other

## 2016-05-07 ENCOUNTER — Ambulatory Visit (HOSPITAL_BASED_OUTPATIENT_CLINIC_OR_DEPARTMENT_OTHER): Payer: Medicare Other | Admitting: Oncology

## 2016-05-07 ENCOUNTER — Telehealth: Payer: Self-pay | Admitting: Oncology

## 2016-05-07 ENCOUNTER — Ambulatory Visit (HOSPITAL_BASED_OUTPATIENT_CLINIC_OR_DEPARTMENT_OTHER): Payer: Medicare Other

## 2016-05-07 VITALS — BP 109/73 | HR 100 | Temp 98.3°F | Resp 20 | Ht 63.0 in | Wt 109.2 lb

## 2016-05-07 DIAGNOSIS — K5900 Constipation, unspecified: Secondary | ICD-10-CM

## 2016-05-07 DIAGNOSIS — J90 Pleural effusion, not elsewhere classified: Secondary | ICD-10-CM

## 2016-05-07 DIAGNOSIS — G4700 Insomnia, unspecified: Secondary | ICD-10-CM | POA: Diagnosis not present

## 2016-05-07 DIAGNOSIS — C7801 Secondary malignant neoplasm of right lung: Secondary | ICD-10-CM

## 2016-05-07 DIAGNOSIS — C779 Secondary and unspecified malignant neoplasm of lymph node, unspecified: Secondary | ICD-10-CM

## 2016-05-07 DIAGNOSIS — C155 Malignant neoplasm of lower third of esophagus: Secondary | ICD-10-CM

## 2016-05-07 DIAGNOSIS — R05 Cough: Secondary | ICD-10-CM

## 2016-05-07 DIAGNOSIS — Z5111 Encounter for antineoplastic chemotherapy: Secondary | ICD-10-CM | POA: Diagnosis present

## 2016-05-07 DIAGNOSIS — R0609 Other forms of dyspnea: Secondary | ICD-10-CM | POA: Diagnosis not present

## 2016-05-07 LAB — CBC WITH DIFFERENTIAL/PLATELET
BASO%: 0.3 % (ref 0.0–2.0)
BASOS ABS: 0 10*3/uL (ref 0.0–0.1)
EOS ABS: 0.1 10*3/uL (ref 0.0–0.5)
EOS%: 1.7 % (ref 0.0–7.0)
HEMATOCRIT: 40.4 % (ref 38.4–49.9)
HEMOGLOBIN: 13.4 g/dL (ref 13.0–17.1)
LYMPH#: 1 10*3/uL (ref 0.9–3.3)
LYMPH%: 17.3 % (ref 14.0–49.0)
MCH: 28 pg (ref 27.2–33.4)
MCHC: 33.2 g/dL (ref 32.0–36.0)
MCV: 84.5 fL (ref 79.3–98.0)
MONO#: 0.4 10*3/uL (ref 0.1–0.9)
MONO%: 7.1 % (ref 0.0–14.0)
NEUT%: 73.6 % (ref 39.0–75.0)
NEUTROS ABS: 4.4 10*3/uL (ref 1.5–6.5)
Platelets: 269 10*3/uL (ref 140–400)
RBC: 4.78 10*6/uL (ref 4.20–5.82)
RDW: 14.3 % (ref 11.0–14.6)
WBC: 5.9 10*3/uL (ref 4.0–10.3)

## 2016-05-07 MED ORDER — SODIUM CHLORIDE 0.9 % IV SOLN
80.0000 mg/m2 | Freq: Once | INTRAVENOUS | Status: AC
  Administered 2016-05-07: 120 mg via INTRAVENOUS
  Filled 2016-05-07: qty 20

## 2016-05-07 MED ORDER — FAMOTIDINE IN NACL 20-0.9 MG/50ML-% IV SOLN
INTRAVENOUS | Status: AC
  Filled 2016-05-07: qty 50

## 2016-05-07 MED ORDER — FAMOTIDINE IN NACL 20-0.9 MG/50ML-% IV SOLN
20.0000 mg | Freq: Once | INTRAVENOUS | Status: AC
  Administered 2016-05-07: 20 mg via INTRAVENOUS

## 2016-05-07 MED ORDER — PALONOSETRON HCL INJECTION 0.25 MG/5ML
INTRAVENOUS | Status: AC
  Filled 2016-05-07: qty 5

## 2016-05-07 MED ORDER — SODIUM CHLORIDE 0.9 % IV SOLN
10.0000 mg | Freq: Once | INTRAVENOUS | Status: AC
  Administered 2016-05-07: 10 mg via INTRAVENOUS
  Filled 2016-05-07: qty 1

## 2016-05-07 MED ORDER — SODIUM CHLORIDE 0.9 % IV SOLN
172.2000 mg | Freq: Once | INTRAVENOUS | Status: AC
  Administered 2016-05-07: 170 mg via INTRAVENOUS
  Filled 2016-05-07: qty 17

## 2016-05-07 MED ORDER — SODIUM CHLORIDE 0.9 % IV SOLN
Freq: Once | INTRAVENOUS | Status: AC
  Administered 2016-05-07: 11:00:00 via INTRAVENOUS

## 2016-05-07 MED ORDER — PALONOSETRON HCL INJECTION 0.25 MG/5ML
0.2500 mg | Freq: Once | INTRAVENOUS | Status: AC
  Administered 2016-05-07: 0.25 mg via INTRAVENOUS

## 2016-05-07 MED ORDER — POLYETHYLENE GLYCOL 3350 17 G PO PACK: 17.0000 g | PACK | Freq: Every day | ORAL | Status: AC

## 2016-05-07 MED ORDER — DIPHENHYDRAMINE HCL 50 MG/ML IJ SOLN
25.0000 mg | Freq: Once | INTRAMUSCULAR | Status: AC
  Administered 2016-05-07: 25 mg via INTRAVENOUS

## 2016-05-07 MED ORDER — DIPHENHYDRAMINE HCL 50 MG/ML IJ SOLN
INTRAMUSCULAR | Status: AC
  Filled 2016-05-07: qty 1

## 2016-05-07 MED ORDER — LORAZEPAM 0.5 MG PO TABS: 0.5000 mg | ORAL_TABLET | Freq: Every evening | ORAL | Status: DC | PRN

## 2016-05-07 NOTE — Telephone Encounter (Signed)
Gave pt cal & avs °

## 2016-05-07 NOTE — Progress Notes (Signed)
[facility] OFFICE PROGRESS NOTE   Diagnosis:  Esophagus cancer  INTERVAL HISTORY:   GB.[id number] completed a first cycle of salvage Taxol/carboplatin on 04/30/2016. He reports mild tingling in the toes. He has noted some improvement in the cough. He continues to have exertional dyspnea. An inhaler helps. He complains of constipation and insomnia.    Objective:  Vital signs in last 24 hours:  Blood pressure 109/73, pulse 100, temperature 98.3 F (36.8 C), temperature source Oral, resp. rate 20, height '5\' 3"'$  (1.6 m), weight 109 lb 3.2 oz (49.533 kg), SpO2 100 %.    HEENT:  no thrush Resp:  decreased breath sounds throughout the right chest, mild bilateral inspiratory/expiratory wheezes, no respiratory distress Cardio:  regular rate and rhythm GI:  no hepatomegaly Vascular:  no leg edema   Lab Results:  Lab Results  Component Value Date   WBC 5.9 05/07/2016   HGB 13.4 05/07/2016   HCT 40.4 05/07/2016   MCV 84.5 05/07/2016   PLT 269 05/07/2016   NEUTROABS 4.4 05/07/2016     Medications: I have reviewed the patient's current medications.  Assessment/Plan: 1. Squamous cell carcinoma. Staging CT scans of the chest, abdomen and pelvis on 06/02/2012 negative for evidence of metastatic disease. Endoscopic ultrasound 06/11/2012 confirmed a uT3 uN1 tumor at 40 cm from the incisors. He began radiation 06/15/2012. He began weekly Taxol/carboplatin chemotherapy 06/16/2012. He completed 6 weekly chemotherapy treatments. The carboplatin was held with week 5 due to thrombocytopenia. He completed the sixth weekly treatment with Taxol and carboplatin on 07/23/2012. He completed radiation on 07/29/2012 -he underwent an esophagectomy on 08/26/2012 with the pathology confirming a ypT3,ypN0 squamous cell carcinoma with perineural invasion identified.  2. History of solid/liquid dysphagia secondary to the obstructing esophagus mass.  3. Reflux. He is taking  Protonix. 4. Hospitalization 07/19/2014 with abdominal pain and vomiting. CT scan showed a mid small bowel obstruction without discrete mass or evident etiology. Small bowel obstruction felt to be secondary to adhesions. 5. Renal failure during the 07/19/2014 hospitalization felt to be contrast-induced nephropathy. 6. Chest CT 06/15/2013 with postoperative changes from esophagectomy and gastric pull-through. 7 mm posterior subpleural nodule in the right lower lobe. Followup CT scan 12/16/2013 revealed an increase in the size of a right hilar node and right upper lobe nodule.  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  Chest CT 12/23/2014 with enlarged right hilar lymph node measuring 1.2 cm, previously 0.8 cm. Slight enlargement of a periaortic lymph node, 8 mm as compared to 7 mm on the previous study. Increased thickening of the right pleural rind. Moderate sized right pleural effusion.  Chest x-ray 11/28/2015, no change in right pleural effusion  Chest x-ray 04/18/2016 with increase in the right pleural effusion.  Thoracentesis 04/24/2016-only 25 mL of pleural fluid could be obtained  CT chest 04/24/2016-progressive mediastinal/hilar lymphadenopathy, large right pleural effusion with mixed density, interstitial thickening in the right lower  lung  concerning for lymphatic tumor spread  Salvage chemotherapy with weekly Taxol/carboplatin initiated 04/30/2016  7. Headaches. Negative brain CT 12/23/2014.   Disposition:   Bruce [name] tolerated the Taxol/carboplatin well. He continues to have a cough and dyspnea. His overall status appears unchanged today. We refilled his inhaler. He will try MiraLAX for constipation. He will use Ativan as needed for insomnia.  He will return for chemotherapy on 05/14/2016 and 05/22/2016. He will be seen for an office visit on 05/22/2016.  He is scheduled for an office visit and chest x-ray on 06/04/2016.  I discussed the situation with his English-speaking daughter. She understands the seriousness of the diagnosis of metastatic esophagus cancer. She will contact us if he develops increased dyspnea.  Betsy Coder, MD  05/07/2016  10:36 AM

## 2016-05-07 NOTE — Patient Instructions (Signed)
[facility] Discharge Instructions for Patients Receiving Chemotherapy  Today you received the following chemotherapy agents Paclitaxel/Carboplatin.  To help prevent nausea and vomiting after your treatment, we encourage you to take your nausea medication as directed.    If you develop nausea and vomiting that is not controlled by your nausea medication, call the clinic.   BELOW ARE SYMPTOMS THAT SHOULD BE REPORTED IMMEDIATELY:  *FEVER GREATER THAN 100.5 F  *CHILLS WITH OR WITHOUT FEVER  NAUSEA AND VOMITING THAT IS NOT CONTROLLED WITH YOUR NAUSEA MEDICATION  *UNUSUAL SHORTNESS OF BREATH  *UNUSUAL BRUISING OR BLEEDING  TENDERNESS IN MOUTH AND THROAT WITH OR WITHOUT PRESENCE OF ULCERS  *URINARY PROBLEMS  *BOWEL PROBLEMS  UNUSUAL RASH Items with * indicate a potential emergency and should be followed up as soon as possible.  Feel free to call the clinic you have any questions or concerns. The clinic phone number is [phone].  Please show the [facility] at check-in to the Emergency Department and triage nurse.

## 2016-05-07 NOTE — Progress Notes (Signed)
Okay to treat today with CMP results from 2 weeks ago per Dr. Benay Spice.

## 2016-05-12 ENCOUNTER — Other Ambulatory Visit: Payer: Self-pay | Admitting: Oncology

## 2016-05-14 ENCOUNTER — Ambulatory Visit (HOSPITAL_BASED_OUTPATIENT_CLINIC_OR_DEPARTMENT_OTHER): Payer: Medicare Other

## 2016-05-14 ENCOUNTER — Other Ambulatory Visit (HOSPITAL_BASED_OUTPATIENT_CLINIC_OR_DEPARTMENT_OTHER): Payer: Medicare Other

## 2016-05-14 ENCOUNTER — Ambulatory Visit: Payer: Medicare Other | Admitting: Nutrition

## 2016-05-14 ENCOUNTER — Encounter: Payer: Self-pay | Admitting: *Deleted

## 2016-05-14 VITALS — BP 104/66 | HR 99 | Temp 98.7°F | Resp 18

## 2016-05-14 DIAGNOSIS — C779 Secondary and unspecified malignant neoplasm of lymph node, unspecified: Secondary | ICD-10-CM

## 2016-05-14 DIAGNOSIS — C7801 Secondary malignant neoplasm of right lung: Secondary | ICD-10-CM | POA: Diagnosis not present

## 2016-05-14 DIAGNOSIS — Z5111 Encounter for antineoplastic chemotherapy: Secondary | ICD-10-CM | POA: Diagnosis present

## 2016-05-14 DIAGNOSIS — C155 Malignant neoplasm of lower third of esophagus: Secondary | ICD-10-CM | POA: Diagnosis present

## 2016-05-14 LAB — CBC WITH DIFFERENTIAL/PLATELET
BASO%: 0.7 % (ref 0.0–2.0)
BASOS ABS: 0 10*3/uL (ref 0.0–0.1)
EOS%: 1.5 % (ref 0.0–7.0)
Eosinophils Absolute: 0.1 10*3/uL (ref 0.0–0.5)
HEMATOCRIT: 38.8 % (ref 38.4–49.9)
HGB: 12.5 g/dL — ABNORMAL LOW (ref 13.0–17.1)
LYMPH#: 1.2 10*3/uL (ref 0.9–3.3)
LYMPH%: 27.7 % (ref 14.0–49.0)
MCH: 27.5 pg (ref 27.2–33.4)
MCHC: 32.1 g/dL (ref 32.0–36.0)
MCV: 85.8 fL (ref 79.3–98.0)
MONO#: 0.4 10*3/uL (ref 0.1–0.9)
MONO%: 9.5 % (ref 0.0–14.0)
NEUT#: 2.6 10*3/uL (ref 1.5–6.5)
NEUT%: 60.6 % (ref 39.0–75.0)
PLATELETS: 266 10*3/uL (ref 140–400)
RBC: 4.53 10*6/uL (ref 4.20–5.82)
RDW: 14.6 % (ref 11.0–14.6)
WBC: 4.3 10*3/uL (ref 4.0–10.3)

## 2016-05-14 MED ORDER — FAMOTIDINE IN NACL 20-0.9 MG/50ML-% IV SOLN
20.0000 mg | Freq: Once | INTRAVENOUS | Status: AC
  Administered 2016-05-14: 20 mg via INTRAVENOUS

## 2016-05-14 MED ORDER — PALONOSETRON HCL INJECTION 0.25 MG/5ML
0.2500 mg | Freq: Once | INTRAVENOUS | Status: AC
  Administered 2016-05-14: 0.25 mg via INTRAVENOUS

## 2016-05-14 MED ORDER — FAMOTIDINE IN NACL 20-0.9 MG/50ML-% IV SOLN
INTRAVENOUS | Status: AC
  Filled 2016-05-14: qty 50

## 2016-05-14 MED ORDER — SODIUM CHLORIDE 0.9 % IV SOLN
80.0000 mg/m2 | Freq: Once | INTRAVENOUS | Status: AC
  Administered 2016-05-14: 120 mg via INTRAVENOUS
  Filled 2016-05-14: qty 20

## 2016-05-14 MED ORDER — DEXAMETHASONE SODIUM PHOSPHATE 100 MG/10ML IJ SOLN
10.0000 mg | Freq: Once | INTRAMUSCULAR | Status: AC
  Administered 2016-05-14: 10 mg via INTRAVENOUS
  Filled 2016-05-14: qty 1

## 2016-05-14 MED ORDER — DIPHENHYDRAMINE HCL 50 MG/ML IJ SOLN
25.0000 mg | Freq: Once | INTRAMUSCULAR | Status: AC
  Administered 2016-05-14: 25 mg via INTRAVENOUS

## 2016-05-14 MED ORDER — SODIUM CHLORIDE 0.9 % IV SOLN
Freq: Once | INTRAVENOUS | Status: AC
  Administered 2016-05-14: 12:00:00 via INTRAVENOUS

## 2016-05-14 MED ORDER — PALONOSETRON HCL INJECTION 0.25 MG/5ML
INTRAVENOUS | Status: AC
  Filled 2016-05-14: qty 5

## 2016-05-14 MED ORDER — SODIUM CHLORIDE 0.9 % IV SOLN
172.2000 mg | Freq: Once | INTRAVENOUS | Status: AC
  Administered 2016-05-14: 170 mg via INTRAVENOUS
  Filled 2016-05-14: qty 17

## 2016-05-14 MED ORDER — DIPHENHYDRAMINE HCL 50 MG/ML IJ SOLN
INTRAMUSCULAR | Status: AC
  Filled 2016-05-14: qty 1

## 2016-05-14 NOTE — Progress Notes (Unsigned)
OK to treat with CMET from 04/23/16 per Dr. Benay Spice.

## 2016-05-14 NOTE — Progress Notes (Signed)
I met with patient's daughter briefly.  Patient is a 55 year old male diagnosed with metastatic esophageal cancer with a history of dysphasia.  Current medications include Ativan, Protonix, MiraLAX, and Compazine.  Labs include albumin 3.7 on June 6.  Height: 63 inches. Weight: 109.2 pounds June 20. Usual body weight: 119 pounds. BMI: 19.35.  Patient's daughter reports patient is interested in resuming oral nutrition supplements. After some trial, we think patient was drinking resource breeze in the past. Encouraged patient's daughter to offer patient these supplements 2-3 times daily providing an additional 250 cal and 9 g protein, each. Provided purchasing information. Questions were answered and teach back method used. Daughter did not feel it necessary for me to speak with patient in detail. Contact information was given to patient's daughter and she was encouraged to call me with questions or concerns.

## 2016-05-14 NOTE — Patient Instructions (Signed)
[facility] Discharge Instructions for Patients Receiving Chemotherapy  Today you received the following chemotherapy agents Taxol and Carboplatin  To help prevent nausea and vomiting after your treatment, we encourage you to take your nausea medication     If you develop nausea and vomiting that is not controlled by your nausea medication, call the clinic.   BELOW ARE SYMPTOMS THAT SHOULD BE REPORTED IMMEDIATELY:  *FEVER GREATER THAN 100.5 F  *CHILLS WITH OR WITHOUT FEVER  NAUSEA AND VOMITING THAT IS NOT CONTROLLED WITH YOUR NAUSEA MEDICATION  *UNUSUAL SHORTNESS OF BREATH  *UNUSUAL BRUISING OR BLEEDING  TENDERNESS IN MOUTH AND THROAT WITH OR WITHOUT PRESENCE OF ULCERS  *URINARY PROBLEMS  *BOWEL PROBLEMS  UNUSUAL RASH Items with * indicate a potential emergency and should be followed up as soon as possible.  Feel free to call the clinic you have any questions or concerns. The clinic phone number is [phone].  Please show the CHEMO ALERT CARD at check-in to the Emergency Department and triage nurse.   

## 2016-05-19 ENCOUNTER — Telehealth: Payer: Self-pay | Admitting: Oncology

## 2016-05-19 NOTE — Telephone Encounter (Signed)
PAL - cxd 7/13 f/u with BS. Per BS patient has appointment 7/5. Spoke with patient's dtr re cancelling f/u for 7/13 and confirmed appointments for 7/5 and 7/18. Patient will get new schedule 7/5.

## 2016-05-20 ENCOUNTER — Other Ambulatory Visit: Payer: Self-pay | Admitting: *Deleted

## 2016-05-20 DIAGNOSIS — C7801 Secondary malignant neoplasm of right lung: Secondary | ICD-10-CM

## 2016-05-21 ENCOUNTER — Other Ambulatory Visit: Payer: Self-pay | Admitting: Oncology

## 2016-05-22 ENCOUNTER — Ambulatory Visit (HOSPITAL_BASED_OUTPATIENT_CLINIC_OR_DEPARTMENT_OTHER): Payer: Medicare Other

## 2016-05-22 ENCOUNTER — Telehealth: Payer: Self-pay | Admitting: *Deleted

## 2016-05-22 ENCOUNTER — Other Ambulatory Visit: Payer: Medicare Other

## 2016-05-22 ENCOUNTER — Ambulatory Visit (HOSPITAL_BASED_OUTPATIENT_CLINIC_OR_DEPARTMENT_OTHER): Payer: Medicare Other | Admitting: Nurse Practitioner

## 2016-05-22 ENCOUNTER — Other Ambulatory Visit (HOSPITAL_BASED_OUTPATIENT_CLINIC_OR_DEPARTMENT_OTHER): Payer: Medicare Other

## 2016-05-22 VITALS — BP 108/71 | HR 99 | Temp 97.9°F | Resp 18 | Ht 63.0 in | Wt 108.3 lb

## 2016-05-22 DIAGNOSIS — C155 Malignant neoplasm of lower third of esophagus: Secondary | ICD-10-CM

## 2016-05-22 DIAGNOSIS — G4700 Insomnia, unspecified: Secondary | ICD-10-CM | POA: Diagnosis not present

## 2016-05-22 DIAGNOSIS — C7800 Secondary malignant neoplasm of unspecified lung: Secondary | ICD-10-CM

## 2016-05-22 DIAGNOSIS — C7801 Secondary malignant neoplasm of right lung: Secondary | ICD-10-CM | POA: Diagnosis not present

## 2016-05-22 DIAGNOSIS — C779 Secondary and unspecified malignant neoplasm of lymph node, unspecified: Secondary | ICD-10-CM

## 2016-05-22 DIAGNOSIS — Z5111 Encounter for antineoplastic chemotherapy: Secondary | ICD-10-CM | POA: Diagnosis not present

## 2016-05-22 LAB — CBC WITH DIFFERENTIAL/PLATELET
BASO%: 0.6 % (ref 0.0–2.0)
BASOS ABS: 0 10*3/uL (ref 0.0–0.1)
EOS ABS: 0.1 10*3/uL (ref 0.0–0.5)
EOS%: 1.4 % (ref 0.0–7.0)
HCT: 36.8 % — ABNORMAL LOW (ref 38.4–49.9)
HEMOGLOBIN: 12.3 g/dL — AB (ref 13.0–17.1)
LYMPH#: 1.2 10*3/uL (ref 0.9–3.3)
LYMPH%: 33.4 % (ref 14.0–49.0)
MCH: 28 pg (ref 27.2–33.4)
MCHC: 33.4 g/dL (ref 32.0–36.0)
MCV: 83.6 fL (ref 79.3–98.0)
MONO#: 0.4 10*3/uL (ref 0.1–0.9)
MONO%: 12.5 % (ref 0.0–14.0)
NEUT%: 52.1 % (ref 39.0–75.0)
NEUTROS ABS: 1.8 10*3/uL (ref 1.5–6.5)
PLATELETS: 222 10*3/uL (ref 140–400)
RBC: 4.4 10*6/uL (ref 4.20–5.82)
RDW: 14.6 % (ref 11.0–14.6)
WBC: 3.5 10*3/uL — ABNORMAL LOW (ref 4.0–10.3)

## 2016-05-22 LAB — COMPREHENSIVE METABOLIC PANEL
ALT: 18 U/L (ref 0–55)
ANION GAP: 9 meq/L (ref 3–11)
AST: 20 U/L (ref 5–34)
Albumin: 3.4 g/dL — ABNORMAL LOW (ref 3.5–5.0)
Alkaline Phosphatase: 82 U/L (ref 40–150)
BUN: 9.9 mg/dL (ref 7.0–26.0)
CHLORIDE: 106 meq/L (ref 98–109)
CO2: 23 meq/L (ref 22–29)
Calcium: 9.1 mg/dL (ref 8.4–10.4)
Creatinine: 0.9 mg/dL (ref 0.7–1.3)
Glucose: 93 mg/dl (ref 70–140)
Potassium: 3.9 mEq/L (ref 3.5–5.1)
Sodium: 137 mEq/L (ref 136–145)
Total Bilirubin: 0.36 mg/dL (ref 0.20–1.20)
Total Protein: 7.1 g/dL (ref 6.4–8.3)

## 2016-05-22 MED ORDER — SODIUM CHLORIDE 0.9 % IV SOLN
Freq: Once | INTRAVENOUS | Status: AC
  Administered 2016-05-22: 13:00:00 via INTRAVENOUS

## 2016-05-22 MED ORDER — SODIUM CHLORIDE 0.9 % IV SOLN
170.0000 mg | Freq: Once | INTRAVENOUS | Status: AC
  Administered 2016-05-22: 170 mg via INTRAVENOUS
  Filled 2016-05-22: qty 17

## 2016-05-22 MED ORDER — SODIUM CHLORIDE 0.9 % IV SOLN
80.0000 mg/m2 | Freq: Once | INTRAVENOUS | Status: AC
  Administered 2016-05-22: 120 mg via INTRAVENOUS
  Filled 2016-05-22: qty 20

## 2016-05-22 MED ORDER — FAMOTIDINE IN NACL 20-0.9 MG/50ML-% IV SOLN
20.0000 mg | Freq: Once | INTRAVENOUS | Status: AC
  Administered 2016-05-22: 20 mg via INTRAVENOUS

## 2016-05-22 MED ORDER — TEMAZEPAM 15 MG PO CAPS: 15.0000 mg | ORAL_CAPSULE | Freq: Every evening | ORAL | Status: DC | PRN

## 2016-05-22 MED ORDER — FAMOTIDINE 20 MG PO TABS
ORAL_TABLET | ORAL | Status: AC
  Filled 2016-05-22: qty 1

## 2016-05-22 MED ORDER — DIPHENHYDRAMINE HCL 25 MG PO CAPS
ORAL_CAPSULE | ORAL | Status: AC
  Filled 2016-05-22: qty 1

## 2016-05-22 MED ORDER — FAMOTIDINE IN NACL 20-0.9 MG/50ML-% IV SOLN
INTRAVENOUS | Status: AC
  Filled 2016-05-22: qty 50

## 2016-05-22 MED ORDER — BENZONATATE 100 MG PO CAPS: 200.0000 mg | ORAL_CAPSULE | Freq: Three times a day (TID) | ORAL | Status: DC | PRN

## 2016-05-22 MED ORDER — DIPHENHYDRAMINE HCL 50 MG/ML IJ SOLN
INTRAMUSCULAR | Status: AC
  Filled 2016-05-22: qty 1

## 2016-05-22 MED ORDER — SODIUM CHLORIDE 0.9 % IV SOLN
10.0000 mg | Freq: Once | INTRAVENOUS | Status: AC
  Administered 2016-05-22: 10 mg via INTRAVENOUS
  Filled 2016-05-22: qty 1

## 2016-05-22 MED ORDER — PALONOSETRON HCL INJECTION 0.25 MG/5ML
0.2500 mg | Freq: Once | INTRAVENOUS | Status: AC
  Administered 2016-05-22: 0.25 mg via INTRAVENOUS

## 2016-05-22 MED ORDER — DIPHENHYDRAMINE HCL 50 MG/ML IJ SOLN
25.0000 mg | Freq: Once | INTRAMUSCULAR | Status: AC
  Administered 2016-05-22: 25 mg via INTRAVENOUS

## 2016-05-22 MED ORDER — PALONOSETRON HCL INJECTION 0.25 MG/5ML
INTRAVENOUS | Status: AC
  Filled 2016-05-22: qty 5

## 2016-05-22 NOTE — Progress Notes (Signed)
Ativan called into [facility] on [address]. Pt is aware medication has been refilled.

## 2016-05-22 NOTE — Patient Instructions (Addendum)
Paclitaxel injection ?y l thu?c g? PACLITAXEL l thu?c ha tr? li?u. N nh?m ??n cc t? bo phn chia nhanh, nh? cc t? bo ung th?, v lm cho cc t? bo ? ch?t. Thu?c ny ???c dng ?? ?i?u tr? ung th? bu?ng tr?ng, ung th? ph?i, v cc b?nh ung th? khc. Thu?c ny c th? ???c dng cho nh?ng m?c ?ch khc; hy h?i ng??i cung c?p d?ch v? y t? ho?c d??c s? c?a mnh, n?u qu v? c th?c m?c. Ti c?n ph?i bo cho ng??i cung c?p d?ch v? y t? c?a mnh ?i?u g tr??c khi dng thu?c ny? H? c?n bi?t li?u qu v? hi?n c b?t k? tnh tr?ng no sau ?y hay khng: -cc r?i lo?n v? mu -nh?p tim khng ??u -nhi?m trng (??c bi?t l nhi?m virus, ch?ng h?n nh? th?y ??u, l? mi?ng, ho?c herpes) -b?nh gan -x? tr? tr??c ?y ho?c ?ang x? tr? -pha?n ??ng b?t th???ng ho??c di? ??ng v??i paclitaxel, c?n, d?u castor ???c polyoxyethylate ha, ho?c cc thu?c ha tr? li?u khc -pha?n ??ng b?t th???ng ho??c di? ??ng v??i ca?c d??c ph?m kha?c, th?c ph?m, thu?c nhu?m, ho??c ch?t ba?o qua?n -?ang c thai ho??c ??nh co? thai -?ang cho con bu? Ti nn s? d?ng thu?c ny nh? th? no? Thu?c ny ?? truy?n vo t?nh m?ch. Thu?c ny ???c cho trong b?nh vi?n ho?c phng m?ch b?i chuyn vin y t? ???c hu?n luy?n ??c bi?t. Hy bn v?i bc s? nhi khoa c?a qu v? v? vi?c dng thu?c ny ? tr? em. C th? c?n ch?m [place] ??c bi?t. Qu li?u: N?u qu v? cho r?ng mnh ? dng qu nhi?u thu?c ny, th hy lin l?c v?i trung tm ki?m sot ch?t ??c ho?c phng c?p c?u ngay l?p t?c. L?U : Thu?c ny ch? dnh ring cho qu v?. Khng chia s? thu?c ny v?i nh?ng ng??i khc. N?u ti l? qun m?t li?u th sao? ?i?u quan tr?ng l khng nn b? l? li?u thu?c no. Hy lin l?c v?i bc s? ho?c [place] vin y t? c?a mnh, n?u qu v? khng th? gi? ?ng cu?c h?n khm. Nh?ng g c th? t??ng tc v?i thu?c ny? Khng ???c dng thu?c ny cng v?i b?t k? th? no sau ?y: -disulfiram -metronidazole Thu?c ny c?ng c th? t??ng tc v?i cc thu?c sau  ?y: -cyclosporine -diazepam -ketoconazole -m?t s? thu?c lm t?ng s? l??ng t? bo mu, ch?ng h?n nh? filgrastim, pegfilgrastim, sargramostim -cc thu?c ha tr? li?u khc, ch?ng h?n nh? cisplatin, doxorubicin, epirubicin, etoposide, teniposide, vincristine -quinidine -testosterone -cc thu?c ch?ng ng?a -verapamil Hy bo cho bc s? ho?c chuyn vin y t? c?a mnh tr??c khi dng b?t k? thu?c no trong s? nh?ng thu?c ny: -acetaminophen -aspirin -ibuprofen -ketoprofen -naproxen Danh sch ny c th? khng m t? ?? h?t cc t??ng tc c th? x?y ra. Hy ??a cho ng??i cung c?p d?ch v? y t? c?a mnh danh sch t?t c? cc thu?c, th?o d??c, cc thu?c khng c?n toa, ho?c cc ch? ph?m b? sung m qu v? dng. C?ng nn bo cho h? bi?t r?ng qu v? c ht thu?c, u?ng r??u, ho?c c s? d?ng ma ty tri php hay khng. Vi th? c th? t??ng tc v?i thu?c c?a qu v?. Ti c?n ph?i theo di ?i?u g trong khi dng thu?c ny? Qu v? s? ???c theo di ch?t ch? trong khi dng thu?c ny. Qu v? s? c?n ph?i ?i lm cc xt nghi?m mu quan tr?ng trong th?i  gian dng thu?c ny. Thu?c ny c th? lm cho qu v? c?m th?y khng ???c kh?e nh? th??ng l?. ?i?u ny khng ph?i khng ph? bi?n, b?i v thu?c ha tr? li?u c th? ?nh h??ng ??n c? t? bo lnh l?n t? bo ung th?. Hy t??ng trnh m?i tc d?ng ph?. Hy ti?p t?c ??t ?i?u tr? c?a mnh ngay c? khi qu v? c?m th?y m?t, tr? khi bc s? yu c?u qu v? ng?ng ?i?u tr?. Thu?c ny c th? gy ra nh?ng ph?n ?ng d? ?ng nghim tr?ng. ?? gi?m nguy c?, qu v? s? c?n ph?i dng (cc) thu?c khc tr??c khi ?i?u tr? b?ng thu?c ny. Trong m?t s? tr??ng h?p, qu v? c th? ???c cho dng cc thu?c ph? thm ?? gip gi?m tc d?ng ph?. Hy lm theo t?t c? cc h??ng d?n v? vi?c s? d?ng chng. Hy h?i  ki?n bc s? ho?c chuyn vin y t?, n?u qu v? b? s?t, ?n l?nh ho?c ?au h?ng, ho?c c cc tri?u ch?ng khc c?a c?m l?nh ho?c cm. Khng ???c t? ?i?u tr? cho mnh. Thu?c ny c th? lm gi?m kh? n?ng ch?ng l?i cc  b?nh nhi?m trng c?a c? th?. Hy c? trnh ? g?n nh?ng ng??i b? b?nh. Thu?c ny c th? lm t?ng nguy c? b? b?m tm ho?c ch?y mu. Hy lin l?c v?i bc s? ho?c chuyn vin y t?, n?u qu v? th?y ch?y mu b?t th??ng. Hy c?n th?n khi ?nh r?ng ho?c x?a r?ng b?ng ch? nha khoa ho?c b?ng t?m, b?i v qu v? c th? d? b? nhi?m trng ho?c d? b? ch?y mu h?n. N?u qu v? c ?i lm r?ng, th hy bo v?i nha s? r?ng qu v? ?ang dng thu?c ny. Trnh dng cc thu?c c ch?a aspirin, acetaminophen, ibuprofen, naproxen, ho?c ketoprofen, tr? khi ? ???c bc s? ch? d?n. Cc thu?c ny c th? che l?p tri?u ch?ng s?t. Khng ???c ?? c thai trong khi dng thu?c ny. Ph? n? c?n ph?i thng bo cho bc s? c?a mnh, n?u mu?n c thai ho?c ngh? r?ng c th? mnh ? c [place]. C nguy c? v? cc tc d?ng ph? nghim tr?ng ??i v?i [place] nhi. Hy th?o lu?n v?i bc s? ho?c chuyn vin y t? ho?c d??c s? ?? bi?t thm thng tin. Khng ???c nui con b?ng s?a m? trong khi dng thu?c ny. Nam gi?i ???c khuy?n co khng nn gy th? [place] trong khi dng thu?c ny. Ti c th? nh?n th?y nh?ng tc d?ng ph? no khi dng thu?c ny? Nh?ng tc d?ng ph? qu v? c?n ph?i bo cho bc s? ho?c chuyn vin y t? cng s?m cng t?t: -cc ph?n ?ng d? ?ng, ch?ng h?n nh? da b? m?n ??, ng?a, n?i my ?ay, s?ng ? m?t, mi, ho?c l??i -gi?m s? l??ng t? bo mu - thu?c ny c th? lm gi?m s? l??ng t? bo b?ch c?u, t? bo h?ng c?u v ti?u c?u. Qu v? c th? c nguy c? cao b? nhi?m trng v xu?t huy?t. -cc d?u hi?u nhi?m trng - s?t ho?c ?n l?nh, ho, ?au h?ng, kh ?i ti?u ho?c ?i ti?u ?au -cc d?u hi?u gi?m ti?u c?u ho?c xu?t huy?t - b?m tm, cc n?t l?m t?m ?? trn da, phn c mu ?en, mu h?c n, ch?y mu cam -cc d?u hi?u gi?m s? l??ng t? bo h?ng c?u - c?m th?y y?u ?t ho?c m?t m?i m?t cch b?t th??ng, cc c?n ng?t x?u, chong vng -kh th? -?  au ng?c -huy?t p cao ho?c th?p -lot mi?ng -bu?n i ho?c i m?a -?au, s?ng, ??, ho?c kch ?ng ? ch? tim -?au, t ho?c c?m gic  nh? b? ki?n b ? bn tay ho?c bn chn -tim ??p ch?m ho?c khng ??u -s?ng ? m?t c chn, bn chn, ho?c bn tay Cc tc d?ng ph? khng c?n ph?i ch?m [place] y t? (hy bo cho bc s? ho?c chuyn vin y t?, n?u cc tc d?ng ph? ny ti?p di?n ho?c gy phi?n toi): -?au x??ng -r?ng h?t lng/tc, k? c? tc trn ??u, lng nch, lng mu, lng my, v lng mi -cc thay ??i mu mng tay -tiu ch?y -mng tay b? bong -m?t c?m gic ngon mi?ng -?au ? kh?p ho?c c? b?p -?? b?ng ho?c m?n ?? da -v m? hi Danh sch ny c th? khng m t? ?? h?t cc tc d?ng ph? c th? x?y ra. Xin g?i t?i bc s? c?a mnh ?? ???c c? v?n chuyn mn v? cc tc d?ng ph?Sander Nephew v? c th? t??ng trnh cc tc d?ng ph? cho FDA theo s? [phone]. Ti nn c?t gi? thu?c c?a mnh ? ?u? Thu?c ny ???c s? d?ng b?i chuyn vin y t? ? b?nh vi?n ho?c ? phng m?ch. Qu v? s? khng ???c c?p thu?c ny ?? c?t gi? t?i nh. L?U : ?y l b?n tm t?t. N c th? khng bao hm t?t c? thng tin c th? c. N?u qu v? th?c m?c v? thu?c ny, xin trao ??i v?i bc s?, d??c s?, ho?c ng??i cung c?p d?ch v? y t? c?a mnh.    2016, Elsevier/Gold Standard. (2015-03-17 00:00:00)  Carboplatin injection ?y l thu?c g? CARBOPLATIN l thu?c ha tr? li?u. N nh?m ??n cc t? bo phn chia nhanh, nh? cc t? bo ung th?, v lm cho cc t? bo ? ch?t. Thu?c ny c?ng ???c dng ?? ?i?u tr? ung th? bu?ng tr?ng v nhi?u b?nh ung th? khc. Thu?c ny c th? ???c dng cho nh?ng m?c ?ch khc; hy h?i ng??i cung c?p d?ch v? y t? ho?c d??c s? c?a mnh, n?u qu v? c th?c m?c. Ti c?n ph?i bo cho ng??i cung c?p d?ch v? y t? c?a mnh ?i?u g tr??c khi dng thu?c ny? H? c?n bi?t li?u qu v? hi?n c b?t k? tnh tr?ng no sau ?y hay khng: -cc r?i lo?n v? mu -cc v?n ?? v? thnh l?c -b?nh th?n -m?i x? tr? g?n ?y ho?c ?ang x? tr? -pha?n ??ng b?t th???ng ho??c di? ??ng v??i carboplatin, cisplatin, ho?c cc thu?c ha tr? li?u khc -pha?n ??ng b?t th???ng ho??c di? ??ng  v??i ca?c d??c ph?m kha?c, th?c ph?m, thu?c nhu?m, ho??c ch?t ba?o qua?n -?ang c thai ho??c ??nh co? thai -?ang cho con bu? Ti nn s? d?ng thu?c ny nh? th? no? Thu?c ny th??ng ???c dng nh? thu?c ?? truy?n vo t?nh m?ch. Thu?c ny ???c cho trong b?nh vi?n ho?c phng m?ch b?i chuyn vin y t? ???c hu?n luy?n ??c bi?t. Hy bn v?i bc s? nhi khoa c?a qu v? v? vi?c dng thu?c ny ? tr? em. C th? c?n ch?m [place] ??c bi?t. Qu li?u: N?u qu v? cho r?ng mnh ? dng qu nhi?u thu?c ny, th hy lin l?c v?i trung tm ki?m sot ch?t ??c ho?c phng c?p c?u ngay l?p t?c. L?U : Thu?c ny ch? dnh ring cho qu v?. Khng chia s? thu?c ny v?i nh?ng ng??i khc. N?u ti l? qun m?t li?u  th sao? ?i?u quan tr?ng l khng nn b? l? li?u thu?c no. Hy lin l?c v?i bc s? ho?c [place] vin y t? c?a mnh, n?u qu v? khng th? gi? ?ng cu?c h?n khm. Nh?ng g c th? t??ng tc v?i thu?c ny? -cc thu?c dng cho cc ch?ng co gi?t (kinh phong) -m?t s? thu?c lm t?ng s? l??ng t? bo mu, ch?ng h?n nh? filgrastim, pegfilgrastim, sargramostim -m?t s? thu?c khng sinh, ch?ng h?n nh? amikacin, gentamicin, neomycin, streptomycin, tobramycin -cc thu?c ch?ng ng?a Hy bo cho bc s? ho?c chuyn vin y t? c?a mnh tr??c khi dng b?t k? thu?c no trong s? nh?ng thu?c ny: -acetaminophen -aspirin -ibuprofen -ketoprofen -naproxen Danh sch ny c th? khng m t? ?? h?t cc t??ng tc c th? x?y ra. Hy ??a cho ng??i cung c?p d?ch v? y t? c?a mnh danh sch t?t c? cc thu?c, th?o d??c, cc thu?c khng c?n toa, ho?c cc ch? ph?m b? sung m qu v? dng. C?ng nn bo cho h? bi?t r?ng qu v? c ht thu?c, u?ng r??u, ho?c c s? d?ng ma ty tri php hay khng. Vi th? c th? t??ng tc v?i thu?c c?a qu v?. Ti c?n ph?i theo di ?i?u g trong khi dng thu?c ny? Qu v? s? ???c theo di ch?t ch? trong khi dng thu?c ny. Qu v? s? c?n ph?i ?i lm cc xt nghi?m mu quan tr?ng trong th?i gian dng thu?c ny. Thu?c ny c th? lm  cho qu v? c?m th?y khng ???c kh?e nh? th??ng l?. ?i?u ny khng ph?i khng ph? bi?n, b?i v thu?c ha tr? li?u c th? ?nh h??ng ??n c? t? bo lnh l?n t? bo ung th?. Hy t??ng trnh m?i tc d?ng ph?. Hy ti?p t?c ??t ?i?u tr? c?a mnh ngay c? khi qu v? c?m th?y m?t, tr? khi bc s? yu c?u qu v? ng?ng ?i?u tr?[place] m?t s? tr??ng h?p, qu v? c th? ???c cho dng cc thu?c ph? thm ?? gip gi?m tc d?ng ph?. Hy lm theo t?t c? cc h??ng d?n v? vi?c s? d?ng chng. Hy h?i  ki?n bc s? ho?c chuyn vin y t?, n?u qu v? b? s?t, ?n l?nh ho?c ?au h?ng, ho?c c cc tri?u ch?ng khc c?a c?m l?nh ho?c cm. Khng ???c t? ?i?u tr? cho mnh. Thu?c ny lm gi?m kh? n?ng ch?ng l?i cc b?nh nhi?m trng c?a c? th?. Hy c? trnh ? g?n nh?ng ng??i b? b?nh. Thu?c ny c th? lm t?ng nguy c? b? b?m tm ho?c ch?y mu. Hy lin l?c v?i bc s? ho?c chuyn vin y t?, n?u qu v? th?y ch?y mu b?t th??ng. Hy c?n th?n khi ?nh r?ng ho?c x?a r?ng b?ng ch? nha khoa ho?c b?ng t?m, b?i v qu v? c th? d? b? nhi?m trng ho?c d? b? ch?y mu h?n. N?u qu v? c ?i lm r?ng, th hy bo v?i nha s? r?ng qu v? ?ang dng thu?c ny. Trnh dng cc thu?c c ch?a aspirin, acetaminophen, ibuprofen, naproxen, ho?c ketoprofen, tr? khi ? ???c bc s? ch? d?n. Cc thu?c ny c th? che l?p tri?u ch?ng s?t. Khng ???c ?? c thai trong khi dng thu?c ny. Ph? n? c?n ph?i thng bo cho bc s? c?a mnh, n?u mu?n c thai ho?c ngh? r?ng c th? mnh ? c [place]. C nguy c? v? cc tc d?ng ph? nghim tr?ng ??i v?i [place] nhi. Hy th?o lu?n v?i bc s? ho?c chuyn vin y t? ho?c d??c  s? ?? bi?t thm thng tin. Khng ???c nui con b?ng s?a m? trong khi dng thu?c ny. Ti c th? nh?n th?y nh?ng tc d?ng ph? no khi dng thu?c ny? Nh?ng tc d?ng ph? qu v? c?n ph?i bo cho bc s? ho?c chuyn vin y t? cng s?m cng t?t: -cc ph?n ?ng d? ?ng, ch?ng h?n nh? da b? m?n ??, ng?a, n?i my ?ay, s?ng ? m?t, mi, ho?c l??i -cc d?u hi?u nhi?m trng - s?t ho?c ?n  l?nh, ho, ?au h?ng, kh ?i ti?u ho?c ?i ti?u ?au -cc d?u hi?u gi?m ti?u c?u ho?c xu?t huy?t - b?m tm, cc n?t l?m t?m ?? trn da, phn c mu ?en, mu h?c n, ch?y mu cam -cc d?u hi?u gi?m s? l??ng t? bo h?ng c?u - c?m th?y y?u ?t ho?c m?t m?i m?t cch b?t th??ng, cc c?n ng?t x?u, chong vng -cc v?n ?? v? h h?p -thay ??i thnh l?c -thay ??i th? l?c -?au ng?c -huy?t a?p cao -gi?m s? l??ng t? bo mu - thu?c ny c th? lm gi?m s? l??ng t? bo b?ch c?u, t? bo h?ng c?u v ti?u c?u. Qu v? c th? c nguy c? cao b? nhi?m trng v xu?t huy?t. -bu?n i ho?c i m?a -?au, s?ng, ??, ho?c kch ?ng ? ch? tim -?au, t ho?c c?m gic nh? b? ki?n b ? bn tay ho?c bn chn -cc v?n ?? v? gi? th?ng b?ng, ni n?ng, ?i l?i -kh ?i ti?u ho?c thay ??i l??ng n??c ti?u ???c bi ti?t Cc tc d?ng ph? khng c?n ph?i ch?m [place] y t? (hy bo cho bc s? ho?c chuyn vin y t?, n?u cc tc d?ng ph? ny ti?p di?n ho?c gy phi?n toi): -r?ng tc -m?t c?m gic ngon mi?ng -thay ??i v? gic, ho?c c v? kim lo?i trong mi?ng Dan[facility] sch ny c th? khng m t? ?? h?t cc tc d?ng ph? c th? x?y ra. Xin g?i t?i bc s? c?a mnh ?? ???c c? v?n chuyn mn v? cc tc d?ng ph?Sander Nephew v? c th? t??ng trnh cc tc d?ng ph? cho FDA theo s? [phone]. Ti nn c?t gi? thu?c c?a mnh ? ?u? Thu?c ny ???c s? d?ng b?i chuyn vin y t? ? b?nh vi?n ho?c ? phng m?ch. Qu v? s? khng ???c c?p thu?c ny ?? c?t gi? t?i nh. L?U : ?y l b?n tm t?t. N c th? khng bao hm t?t c? thng tin c th? c. N?u qu v? th?c m?c v? thu?c ny, xin trao ??i v?i bc s?, d??c s?, ho?c ng??i cung c?p d?ch v? y t? c?a mnh.    2016, Elsevier/Gold Standard. (2009-10-06 00:00:00)    Mirtazapine tablets ?y l thu?c g? MIRTAZAPINE ???c dng ?? ?i?u tr? ch?ng tr?m c?m. Thu?c ny c th? ???c dng cho nh?ng m?c ?ch khc; hy h?i ng??i cung c?p d?ch v? y t? ho?c d??c s? c?a mnh, n?u qu v? c th?c m?c. Ti c?n ph?i bo cho ng??i cung c?p d?ch v?  y t? c?a mnh ?i?u g tr??c khi dng thu?c ny? H? c?n bi?t li?u qu v? c b?t k? tnh tr?ng no sau ?y khng: -r?i lo?n l??ng c?c -b?nh t?ng nhn p -b?nh th?n -b?nh gan -c  ngh? ho?c  ??nh t? v?n -pha?n ??ng b?t th???ng ho??c di? ??ng v??i mirtazapine -pha?n ??ng b?t th???ng ho??c di? ??ng v??i ca?c d??c ph?m kha?c -pha?n ??ng b?t th???ng ho??c di? ??ng v??i th??c ph?m, thu?c nhu?m, ho??c ch?t  ba?o qua?n -?ang c thai ho??c ??nh co? thai -?ang cho con bu? Ti nn s? d?ng thu?c ny nh? th? no? U?ng thu?c ny v?i m?t ly n??c. Hy lm theo cc h??ng d?n trn h?p thu?c ho?c nhn thu?c. Dng thu?c ny vo nh?ng kho?ng th?i gian ??u nhau. Khng ???c dng thu?c ny nhi?u l?n h?n ? ???c ch? d?n. Khng ???c ng?ng s? d?ng thu?c ny m?t cch ??t ng?t, tr? khi bc s? c?a qu v? khuyn lm nh? v?y. Ng?ng s? d?ng thu?c ny qu nhanh c th? gy ra cc tc d?ng ph? nghim tr?ng ho?c tnh tr?ng c?a qu v? c th? tr? nn n?ng h?n. D??c s? s? ??a cho qu v? m?t B?n H??ng D?n v? D??c Ph?m (MedGuide) ??c bi?t cho m?i toa thu?c v cho m?i l?n mua thm thu?c ?. Hy b?o ??m ??c k? thng tin ny m?i l?n. Hy bn v?i bc s? nhi khoa c?a qu v? v? vi?c dng thu?c ny ? tr? em. C th? c?n ch?m [place] ??c bi?t. Qu li?u: N?u qu v? cho r?ng mnh ? dng qu nhi?u thu?c ny, th hy lin l?c v?i trung tm ki?m sot ch?t ??c ho?c phng c?p c?u ngay l?p t?c. L?U : Thu?c ny ch? dnh ring cho qu v?. Khng chia s? thu?c ny v?i nh?ng ng??i khc. N?u ti l? qun m?t li?u th sao? N?u qu v? l? qun m?t li?u thu?c, hy dng li?u thu?c ? ngay khi c th?. N?u h?u nh? ? ??n gi? dng li?u thu?c k? ti?p, th ch? dng li?u thu?c k? ti?p ?[employer]land [employer] Khng ???c dng li?u g?p ?i ho?c dng thm li?u. Nh?ng g c th? t??ng tc v?i thu?c ny? Khng ???c dng thu?c ny cng v?i b?t k? th? no sau ?y: -linezolid -cc d??c ph?m g?i l ch?t ?c ch? men monoamine oxidase (MAO), ch?ng h?n nh? [place], [place], Marplan, Eldepryl,  Carbex -xanh methylene (???c tim vo ti?nh ma?ch) Thu?c ny c?ng c th? t??ng tc v?i cc thu?c sau ?y: -r??u -cc thu?c dng ?? tr? nhi?m HIV ho?c AIDS -m?t s? thu?c ?i?u tr? ho?c phng ng?a c?c mu ?ng, ch?ng h?n nh? warfarin -m?t s? thu?c dng cho ch?ng tr?m c?m, tnh tr?ng lo u, ho?c cc r?i nhi?u tm th?n -m?t s? thu?c dng ?? tr? cc b?nh nhi?m n?m, ch?ng h?n nh? itraconazole, ketoconazole -m?t s? thu?c dng ?? tr? ch?ng ?au n?a ??u, ch?ng h?n nh? almotriptan, eletriptan, frovatriptan, naratriptan, rizatriptan, sumatriptan, zolmitriptan -m?t s? thu?c dng ?? tr? cc ch?ng co gi?t, ch?ng h?n nh? carbamazepine v phenytoin -m?t s? thu?c ng? -cimetidine -erythromycin -fentanyl -lithium -cc thu?c dng ?? tr? huy?t p cao -nefazodone -rasagiline -rifampicin -ca?c ch? ph?m b? sung, ch??ng ha?n nh? cy [facility] (c? [facility]/cy n?c s?i/cy l?nh), cy kava kava, cy n? lang (valerian) -tramadol -tryptophan Danh sch ny c th? khng m t? ?? h?t cc t??ng tc c th? x?y ra. Hy ??a cho ng??i cung c?p d?ch v? y t? c?a mnh danh sch t?t c? cc thu?c, th?o d??c, cc thu?c khng c?n toa, ho?c cc ch? ph?m b? sung m qu v? dng. C?ng nn bo cho h? bi?t r?ng qu v? c ht thu?c, u?ng r??u, ho?c c s? d?ng ma ty tri php hay khng. Vi th? c th? t??ng tc v?i thu?c c?a qu v?. Ti c?n ph?i theo di ?i?u g trong khi dng thu?c ny? Hy bo cho bc s? ho?c chuyn vin y t?, n?u cc tri?u ch?ng c?a  qu v? khng kh h?n, ho?c tr? nn n?ng h?n. Hy ?i g?p bc s? ho?c chuyn vin y t? ?? theo di ??nh k? s? c?i thi?n c?a qu v?. ?i?u quan tr?ng l ph?i ti?p t?c vi?c ?i?u tr? c?a qu v? nh? ? ???c bc s? k trong toa, v c th? m?t vi tu?n ?? th?y ???c tc d?ng ??y ?? c?a thu?c ny. B?nh nhn v gia ?nh c?a b?nh nhn nn coi ch?ng ch?ng tr?m c?m ho?c  ngh? t? v?n m?i xu?t hi?n ho?c tr? nn n?ng h?n. Ngoi ra, hy theo di m?i thay ??i ??t ng?t ho?c mnh li?t v? c?m xc, ch?ng h?n  nh? c?m th?y lo l?ng, b?i r?i, ho?ng s?, cu k?nh, th h?n, gy h?n, b?c ??ng, b?t r?t d? d?i, kch ??ng qu m?c v hi?u ??ng thi qu, ho?c khng ng? ???c. N?u ?i?u ny x?y ra, ??c bi?t l vo th?i k? ??u c?a ??t ?i?u tr? ho?c sau khi thay ??i li?u dng, th hy lin l?c v?i bc s? ho?c chuyn vin y t?. Qu v? c th? b? bu?n ng? ho?c chng m?t. Khng ???c li xe, s? d?ng my mc, ho?c lm nh?ng vi?c c?n ph?i t?nh to cho t?i khi qu v? bi?t ???c thu?c ny ?nh h??ng ln qu v? nh? th? no. Khng ???c ng?i d?y ho?c ??ng d?y nhanh, ??c bi?t l khi qu v? l b?nh nhn l?n tu?i. ?i?u ny lm gi?m nguy c? b? chng m?t ho?c ng?t x?u. R??u c th? c?n tr? tc d?ng c?a thu?c ny. Hy trnh cc ?? u?ng c r??u. Thu?c ny c th? gy kh m?t v nhn m?. N?u qu v? mang knh p trng, th qu v? c th? c?m th?y kh ch?u ?i cht. Thu?c bi tr?n d?ng nh? gi?t c th? c ch. Hy ??n g?p bc s?, n?u v?n ?? nghim tr?ng ho?c khng ch?m d?t. Qu v? c th? b? kh mi?ng. Nhai k?o cao su (chewing gum) khng c ???ng ho?c ng?m k?o c?ng, v u?ng nhi?u n??c c th? c ch. Hy lin l?c v?i bc s?, n?u v?n ?? nghim tr?ng ho?c khng ch?m d?t. Ti c th? nh?n th?y nh?ng tc d?ng ph? no khi dng thu?c ny? Nh?ng tc d?ng ph? qu v? c?n ph?i bo cho bc s? ho?c chuyn vin y t? cng s?m cng t?t: -cc ph?n ?ng d? ?ng, ch?ng h?n nh? da b? m?n ??, ng?a, n?i my ?ay, s?ng ? m?t, mi, ho?c l??i -cc v?n ?? v? h h?p -l l?n -s?t, ?au h?ng, ho?c lot ho?c ph?ng r?p trong mi?ng -cc tri?u ch?ng gi?ng cm bao g?m s?t, ?n l?nh, ho, ?au v nh?c c? b?p ho?c kh?p -?au b?ng km bu?n i ho?c i m?a -cc  ngh? t? v?n ho?c nh?ng thay ??i v? tm tr?ng khc -s?ng bn tay ho?c bn chn -b? b?m tm ho?c xu?t huy?t b?t th??ng -m?t m?i ho?c y?u ?t b?t th??ng -i m?a Cc tc d?ng ph? khng c?n ph?i ch?m Cidra y t? (hy bo cho bc s? ho?c chuyn vin y t?, n?u cc tc d?ng ph? ny ti?p di?n ho?c gy phi?n toi): -to bn -t?ng c?m gic ngon  mi?ng -t?ng cn Danh sch ny c th? khng m t? ?? h?t cc tc d?ng ph? c th? x?y ra. Xin g?i t?i bc s? c?a mnh ?? ???c c? v?n chuyn mn v? cc tc d?ng ph?Sander Nephew v? c th? t??ng trnh cc tc d?ng ph? cho FDA theo s? [phone].  Ti nn c?t gi? thu?c c?a mnh ? ?u? ?? ngoi t?m tay tr? em. C?t gi? ? nhi?t ?? phng t? 16 ??n 31 ?? C (60 ??n 87 ?? F). Trnh ?m ??t v nh sng. V?t b? t?t c? thu?c ch?a dng sau ngy h?t h?n in trn nhn thu?c ho?c bao thu?c. L?U : ?y l b?n tm t?t. N c th? khng bao hm t?t c? thng tin c th? c. N?u qu v? th?c m?c v? thu?c ny, xin trao ??i v?i bc s?, d??c s?, ho?c ng??i cung c?p d?ch v? y t? c?a mnh.    2016, [employer]. (2013-07-14 00:00:00)

## 2016-05-22 NOTE — Telephone Encounter (Signed)
[facility] called to cancel refill authorization for Ativan. Per [facility] prescription has been cancelled.

## 2016-05-22 NOTE — Progress Notes (Signed)
[place] OFFICE PROGRESS NOTE   Diagnosis:  Esophagus cancer  INTERVAL HISTORY:   Bruce [name] returns as scheduled. He completed week 3 Taxol/carboplatin 05/14/2016. He has occasional mild nausea. No mouth sores. No diarrhea or constipation. He occasionally notes numbness and tingling in the hands and feet. Cough and shortness of breath are better. Ativan has not helped with the insomnia. He would like to try something different.  Objective:  Vital signs in last 24 hours:  Blood pressure 108/71, pulse 99, temperature 97.9 F (36.6 C), temperature source Oral, resp. rate 18, height '5\' 3"'$  (1.6 m), weight 108 lb 4.8 oz (49.125 kg), SpO2 100 %.    HEENT: No thrush or ulcers. Resp: Breath sounds diminished right lower one half. No respiratory distress. Cardio: Regular rate and rhythm. GI: Abdomen soft and nontender. No hepatomegaly. Vascular: No leg edema. Neuro: Vibratory sense intact over the fingertips per tuning fork exam.     Lab Results:  Lab Results  Component Value Date   WBC 3.5* 05/22/2016   HGB 12.3* 05/22/2016   HCT 36.8* 05/22/2016   MCV 83.6 05/22/2016   PLT 222 05/22/2016   NEUTROABS 1.8 05/22/2016    Imaging:  No results found.  Medications: I have reviewed the patient's current medications.  Assessment/Plan: 1. Squamous cell carcinoma. Staging CT scans of the chest, abdomen and pelvis on 06/02/2012 negative for evidence of metastatic disease. Endoscopic ultrasound 06/11/2012 confirmed a uT3 uN1 tumor at 40 cm from the incisors. He began radiation 06/15/2012. He began weekly Taxol/carboplatin chemotherapy 06/16/2012. He completed 6 weekly chemotherapy treatments. The carboplatin was held with week 5 due to thrombocytopenia. He completed the sixth weekly treatment with Taxol and carboplatin on 07/23/2012. He completed radiation on 07/29/2012 -he underwent an esophagectomy on 08/26/2012 with the pathology confirming a ypT3,ypN0 squamous cell  carcinoma with perineural invasion identified.  2. History of solid/liquid dysphagia secondary to the obstructing esophagus mass.  3. Reflux. He is taking Protonix. 4. Hospitalization 07/19/2014 with abdominal pain and vomiting. CT scan showed a mid small bowel obstruction without discrete mass or evident etiology. Small bowel obstruction felt to be secondary to adhesions. 5. Renal failure during the 07/19/2014 hospitalization felt to be contrast-induced nephropathy. 6. Chest CT 06/15/2013 with postoperative changes from esophagectomy and gastric pull-through. 7 mm posterior subpleural nodule in the right lower lobe. Followup CT scan 12/16/2013 revealed an increase in the size of a right hilar node and right upper lobe nodule.  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  Chest CT 12/23/2014 with enlarged right hilar lymph node measuring 1.2 cm, previously 0.8 cm. Slight enlargement of a periaortic lymph node, 8 mm as compared to 7 mm on the previous study. Increased thickening of the right pleural rind. Moderate sized right pleural effusion.  Chest x-ray 11/28/2015, no change in right pleural effusion  Chest x-ray 04/18/2016 with increase in the right pleural effusion.  Thoracentesis  04/24/2016-only 25 mL of pleural fluid could be obtained  CT chest  04/24/2016-progressive mediastinal/hilar lymphadenopathy, large right pleural effusion with mixed density, interstitial thickening in the right lower lung concerning for lymphatic tumor spread  Salvage chemotherapy with weekly Taxol/carboplatin initiated 04/30/2016 7. Headaches. Negative brain CT 12/23/2014.    Disposition: Bruce [name] appears stable. He has completed 3 weekly treatments with Taxol/carboplatin. Plan to proceed with week 4 today as scheduled.  For the insomnia I sent a prescription to his pharmacy for Restoril 15 mg at bedtime as needed. He will discontinue Ativan.  He will return for a chest x-ray and follow-up visit on 06/04/2016. He will contact the office in the interim with any problems.    Ned Card ANP/GNP-BC   05/22/2016  11:30 AM

## 2016-05-30 ENCOUNTER — Ambulatory Visit: Payer: Medicare Other | Admitting: Oncology

## 2016-06-01 ENCOUNTER — Other Ambulatory Visit: Payer: Self-pay | Admitting: Oncology

## 2016-06-04 ENCOUNTER — Ambulatory Visit (HOSPITAL_COMMUNITY)
Admission: RE | Admit: 2016-06-04 | Discharge: 2016-06-04 | Disposition: A | Discharge status: Home / Self Care | Payer: Medicare Other | Source: Ambulatory Visit | Attending: Oncology | Admitting: Oncology

## 2016-06-04 ENCOUNTER — Other Ambulatory Visit (HOSPITAL_BASED_OUTPATIENT_CLINIC_OR_DEPARTMENT_OTHER): Payer: Medicare Other

## 2016-06-04 ENCOUNTER — Ambulatory Visit (HOSPITAL_BASED_OUTPATIENT_CLINIC_OR_DEPARTMENT_OTHER): Payer: Medicare Other | Admitting: Nurse Practitioner

## 2016-06-04 ENCOUNTER — Telehealth: Payer: Self-pay | Admitting: Nurse Practitioner

## 2016-06-04 ENCOUNTER — Ambulatory Visit: Payer: Medicare Other

## 2016-06-04 VITALS — BP 102/63 | HR 86 | Temp 98.1°F | Resp 18 | Ht 63.0 in | Wt 107.9 lb

## 2016-06-04 DIAGNOSIS — C7801 Secondary malignant neoplasm of right lung: Secondary | ICD-10-CM

## 2016-06-04 DIAGNOSIS — C155 Malignant neoplasm of lower third of esophagus: Secondary | ICD-10-CM

## 2016-06-04 DIAGNOSIS — J90 Pleural effusion, not elsewhere classified: Secondary | ICD-10-CM | POA: Insufficient documentation

## 2016-06-04 DIAGNOSIS — J984 Other disorders of lung: Secondary | ICD-10-CM | POA: Insufficient documentation

## 2016-06-04 DIAGNOSIS — K1230 Oral mucositis (ulcerative), unspecified: Secondary | ICD-10-CM

## 2016-06-04 DIAGNOSIS — C7800 Secondary malignant neoplasm of unspecified lung: Secondary | ICD-10-CM

## 2016-06-04 DIAGNOSIS — C779 Secondary and unspecified malignant neoplasm of lymph node, unspecified: Secondary | ICD-10-CM

## 2016-06-04 DIAGNOSIS — D701 Agranulocytosis secondary to cancer chemotherapy: Secondary | ICD-10-CM | POA: Diagnosis not present

## 2016-06-04 LAB — CBC WITH DIFFERENTIAL/PLATELET
BASO%: 1.5 % (ref 0.0–2.0)
BASOS ABS: 0.1 10*3/uL (ref 0.0–0.1)
EOS ABS: 0 10*3/uL (ref 0.0–0.5)
EOS%: 1.2 % (ref 0.0–7.0)
HEMATOCRIT: 38.1 % — AB (ref 38.4–49.9)
HGB: 12.8 g/dL — ABNORMAL LOW (ref 13.0–17.1)
LYMPH#: 1.4 10*3/uL (ref 0.9–3.3)
LYMPH%: 41.2 % (ref 14.0–49.0)
MCH: 27.9 pg (ref 27.2–33.4)
MCHC: 33.6 g/dL (ref 32.0–36.0)
MCV: 83 fL (ref 79.3–98.0)
MONO#: 0.8 10*3/uL (ref 0.1–0.9)
MONO%: 22.3 % — ABNORMAL HIGH (ref 0.0–14.0)
NEUT%: 33.8 % — ABNORMAL LOW (ref 39.0–75.0)
NEUTROS ABS: 1.1 10*3/uL — AB (ref 1.5–6.5)
PLATELETS: 138 10*3/uL — AB (ref 140–400)
RBC: 4.59 10*6/uL (ref 4.20–5.82)
RDW: 15.4 % — AB (ref 11.0–14.6)
WBC: 3.4 10*3/uL — AB (ref 4.0–10.3)

## 2016-06-04 LAB — COMPREHENSIVE METABOLIC PANEL
ALT: 27 U/L (ref 0–55)
ANION GAP: 9 meq/L (ref 3–11)
AST: 34 U/L (ref 5–34)
Albumin: 3.3 g/dL — ABNORMAL LOW (ref 3.5–5.0)
Alkaline Phosphatase: 87 U/L (ref 40–150)
BUN: 10.3 mg/dL (ref 7.0–26.0)
CALCIUM: 8.8 mg/dL (ref 8.4–10.4)
CHLORIDE: 104 meq/L (ref 98–109)
CO2: 23 meq/L (ref 22–29)
Creatinine: 0.9 mg/dL (ref 0.7–1.3)
EGFR: >90 mL/min/{1.73_m2} (ref >90)
Glucose: 116 mg/dl (ref 70–140)
POTASSIUM: 3.7 meq/L (ref 3.5–5.1)
Sodium: 136 mEq/L (ref 136–145)
Total Bilirubin: 0.38 mg/dL (ref 0.20–1.20)
Total Protein: 7 g/dL (ref 6.4–8.3)

## 2016-06-04 NOTE — Telephone Encounter (Signed)
Gave pt cal & avs °

## 2016-06-04 NOTE — Progress Notes (Addendum)
[facility] OFFICE PROGRESS NOTE   Diagnosis:  Esophagus cancer  INTERVAL HISTORY:   Bruce [name] returns as scheduled. He completed cycle 1 week 4 Taxol/carboplatin 05/22/2016. He has rare nausea/vomiting. He notes soreness along the right buccal region. He is not sure if there are any ulcers. No diarrhea. Bowels moving regularly. No numbness or tingling in his hands or feet. He is short of breath at times. He notes improvement in the cough.  Objective:  Vital signs in last 24 hours:  Blood pressure 102/63, pulse 86, temperature 98.1 F (36.7 C), temperature source Oral, resp. rate 18, height '5\' 3"'$  (1.6 m), weight 107 lb 14.4 oz (48.943 kg), SpO2 100 %.    HEENT: Erythema with a small area of ulceration right buccal mucosa. Resp: Breath sounds diminished right lower lung field. Inspiratory rales left lung base. No respiratory distress. Cardio: Regular rate and rhythm. GI: No hepatomegaly. Vascular: No leg edema. Neuro: Vibratory sense mildly decreased over the fingertips per tuning fork exam.     Lab Results:  Lab Results  Component Value Date   WBC 3.4* 06/04/2016   HGB 12.8* 06/04/2016   HCT 38.1* 06/04/2016   MCV 83.0 06/04/2016   PLT 138* 06/04/2016   NEUTROABS 1.1* 06/04/2016    Imaging:  Dg Chest 2 View  06/04/2016  CLINICAL DATA:  History is esophageal cancer, pleural effusion. EXAM: CHEST  2 VIEW COMPARISON:  Radiograph of April 24, 2016. FINDINGS: Stable cardiomediastinal silhouette. No pneumothorax is noted. Moderate loculated right pleural effusion is noted which is decreased compared to prior exam. There is associated atelectasis or scarring seen in the right lung. Stable interstitial densities are noted throughout the left lung base consistent with chronic interstitial lung disease or scarring. Bony thorax is unremarkable. IMPRESSION: Moderate loculated right pleural effusion is noted which is slightly decreased compared to prior exam with associated  atelectasis or scarring in the right lung. Electronically Signed   By: Marijo Conception, M.D.   On: 06/04/2016 09:15    Medications: I have reviewed the patient's current medications.  Assessment/Plan: 1. Squamous cell carcinoma. Staging CT scans of the chest, abdomen and pelvis on 06/02/2012 negative for evidence of metastatic disease. Endoscopic ultrasound 06/11/2012 confirmed a uT3 uN1 tumor at 40 cm from the incisors. He began radiation 06/15/2012. He began weekly Taxol/carboplatin chemotherapy 06/16/2012. He completed 6 weekly chemotherapy treatments. The carboplatin was held with week 5 due to thrombocytopenia. He completed the sixth weekly treatment with Taxol and carboplatin on 07/23/2012. He completed radiation on 07/29/2012 -he underwent an esophagectomy on 08/26/2012 with the pathology confirming a ypT3,ypN0 squamous cell carcinoma with perineural invasion identified.  2. History of solid/liquid dysphagia secondary to the obstructing esophagus mass.  3. Reflux. He is taking Protonix. 4. Hospitalization 07/19/2014 with abdominal pain and vomiting. CT scan showed a mid small bowel obstruction without discrete mass or evident etiology. Small bowel obstruction felt to be secondary to adhesions. 5. Renal failure during the 07/19/2014 hospitalization felt to be contrast-induced nephropathy. 6. Chest CT 06/15/2013 with postoperative changes from esophagectomy and gastric pull-through. 7 mm posterior subpleural nodule in the right lower lobe. Followup CT scan 12/16/2013 revealed an increase in the size of a right hilar node and right upper lobe nodule.  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  Chest CT 12/23/2014 with enlarged right hilar lymph node measuring 1.2 cm, previously 0.8 cm. Slight enlargement of a periaortic lymph node, 8 mm as compared to 7 mm on the previous study. Increased thickening of the right pleural rind. Moderate sized right pleural effusion.  Chest x-ray 11/28/2015, no change in right pleural effusion  Chest x-ray 04/18/2016 with increase in the right pleural effusion.  Thoracentesis 04/24/2016-only 25 mL of pleural fluid could be obtained  CT chest 04/24/2016-progressive mediastinal/hilar lymphadenopathy, large right pleural effusion with mixed density, interstitial thickening in the right lower lung concerning for lymphatic tumor spread  Salvage chemotherapy with weekly Taxol/carboplatin initiated 04/30/2016 7. Headaches. Negative brain CT 12/23/2014.   Disposition: Bruce [name] appears stable. The cough has improved. He has completed 1 cycle of weekly Taxol/carboplatin 4. We decided to hold cycle 2 week 1 today and reschedule for one week due to mild neutropenia and mucositis. Chemotherapy will be dose reduced.  He will return for a follow-up visit and treatment in one week. He will contact the office in the interim with any problems.  Patient seen with Dr. Benay Spice.    Ned Card ANP/GNP-BC   06/04/2016  10:07 AM  This was a shared visit with Ned Card. Mr. Halbert has experienced clinical improvement with the Taxol/carboplatin. Chemotherapy will be held today secondary to mucositis and neutropenia. The chemotherapy will be dose reduced. He will return for an office visit and chemotherapy in one  week.  Julieanne Manson, M.D.

## 2016-06-11 ENCOUNTER — Other Ambulatory Visit (HOSPITAL_BASED_OUTPATIENT_CLINIC_OR_DEPARTMENT_OTHER): Payer: Medicare Other

## 2016-06-11 ENCOUNTER — Ambulatory Visit (HOSPITAL_BASED_OUTPATIENT_CLINIC_OR_DEPARTMENT_OTHER): Payer: Medicare Other

## 2016-06-11 ENCOUNTER — Telehealth: Payer: Self-pay | Admitting: Oncology

## 2016-06-11 ENCOUNTER — Ambulatory Visit (HOSPITAL_BASED_OUTPATIENT_CLINIC_OR_DEPARTMENT_OTHER): Payer: Medicare Other | Admitting: Nurse Practitioner

## 2016-06-11 ENCOUNTER — Telehealth: Payer: Self-pay | Admitting: *Deleted

## 2016-06-11 DIAGNOSIS — R05 Cough: Secondary | ICD-10-CM | POA: Diagnosis not present

## 2016-06-11 DIAGNOSIS — Z5111 Encounter for antineoplastic chemotherapy: Secondary | ICD-10-CM | POA: Diagnosis present

## 2016-06-11 DIAGNOSIS — C779 Secondary and unspecified malignant neoplasm of lymph node, unspecified: Secondary | ICD-10-CM | POA: Diagnosis not present

## 2016-06-11 DIAGNOSIS — C155 Malignant neoplasm of lower third of esophagus: Secondary | ICD-10-CM

## 2016-06-11 DIAGNOSIS — C7801 Secondary malignant neoplasm of right lung: Secondary | ICD-10-CM | POA: Diagnosis not present

## 2016-06-11 DIAGNOSIS — R200 Anesthesia of skin: Secondary | ICD-10-CM | POA: Diagnosis not present

## 2016-06-11 DIAGNOSIS — C7800 Secondary malignant neoplasm of unspecified lung: Secondary | ICD-10-CM

## 2016-06-11 DIAGNOSIS — J90 Pleural effusion, not elsewhere classified: Secondary | ICD-10-CM

## 2016-06-11 LAB — CBC WITH DIFFERENTIAL/PLATELET
BASO%: 0.7 % (ref 0.0–2.0)
Basophils Absolute: 0 10*3/uL (ref 0.0–0.1)
EOS%: 1.3 % (ref 0.0–7.0)
Eosinophils Absolute: 0.1 10*3/uL (ref 0.0–0.5)
HCT: 40 % (ref 38.4–49.9)
HEMOGLOBIN: 12.9 g/dL — AB (ref 13.0–17.1)
LYMPH%: 38.7 % (ref 14.0–49.0)
MCH: 27.3 pg (ref 27.2–33.4)
MCHC: 32.1 g/dL (ref 32.0–36.0)
MCV: 84.8 fL (ref 79.3–98.0)
MONO#: 0.8 10*3/uL (ref 0.1–0.9)
MONO%: 13.8 % (ref 0.0–14.0)
NEUT%: 45.5 % (ref 39.0–75.0)
NEUTROS ABS: 2.6 10*3/uL (ref 1.5–6.5)
Platelets: 219 10*3/uL (ref 140–400)
RBC: 4.72 10*6/uL (ref 4.20–5.82)
RDW: 16.7 % — AB (ref 11.0–14.6)
WBC: 5.7 10*3/uL (ref 4.0–10.3)
lymph#: 2.2 10*3/uL (ref 0.9–3.3)

## 2016-06-11 LAB — COMPREHENSIVE METABOLIC PANEL
ALT: 27 U/L (ref 0–55)
AST: 28 U/L (ref 5–34)
Albumin: 3.3 g/dL — ABNORMAL LOW (ref 3.5–5.0)
Alkaline Phosphatase: 89 U/L (ref 40–150)
Anion Gap: 9 mEq/L (ref 3–11)
BILIRUBIN TOTAL: 0.57 mg/dL (ref 0.20–1.20)
BUN: 9.3 mg/dL (ref 7.0–26.0)
CO2: 22 meq/L (ref 22–29)
CREATININE: 0.9 mg/dL (ref 0.7–1.3)
Calcium: 9 mg/dL (ref 8.4–10.4)
Chloride: 107 mEq/L (ref 98–109)
EGFR: >90 mL/min/{1.73_m2} (ref >90)
GLUCOSE: 105 mg/dL (ref 70–140)
Potassium: 3.8 mEq/L (ref 3.5–5.1)
SODIUM: 138 meq/L (ref 136–145)
TOTAL PROTEIN: 7.4 g/dL (ref 6.4–8.3)

## 2016-06-11 MED ORDER — SODIUM CHLORIDE 0.9 % IV SOLN
Freq: Once | INTRAVENOUS | Status: AC
  Administered 2016-06-11: 13:00:00 via INTRAVENOUS

## 2016-06-11 MED ORDER — SODIUM CHLORIDE 0.9 % IV SOLN
135.0000 mg | Freq: Once | INTRAVENOUS | Status: AC
  Administered 2016-06-11: 140 mg via INTRAVENOUS
  Filled 2016-06-11: qty 14

## 2016-06-11 MED ORDER — DIPHENHYDRAMINE HCL 50 MG/ML IJ SOLN
25.0000 mg | Freq: Once | INTRAMUSCULAR | Status: AC
  Administered 2016-06-11: 25 mg via INTRAVENOUS

## 2016-06-11 MED ORDER — SODIUM CHLORIDE 0.9 % IV SOLN
10.0000 mg | Freq: Once | INTRAVENOUS | Status: AC
  Administered 2016-06-11: 10 mg via INTRAVENOUS
  Filled 2016-06-11: qty 1

## 2016-06-11 MED ORDER — PALONOSETRON HCL INJECTION 0.25 MG/5ML
INTRAVENOUS | Status: AC
  Filled 2016-06-11: qty 5

## 2016-06-11 MED ORDER — BENZONATATE 100 MG PO CAPS: 200.0000 mg | ORAL_CAPSULE | Freq: Three times a day (TID) | ORAL | 0 refills | Status: DC | PRN

## 2016-06-11 MED ORDER — FAMOTIDINE IN NACL 20-0.9 MG/50ML-% IV SOLN
20.0000 mg | Freq: Once | INTRAVENOUS | Status: AC
  Administered 2016-06-11: 20 mg via INTRAVENOUS

## 2016-06-11 MED ORDER — DIPHENHYDRAMINE HCL 50 MG/ML IJ SOLN
INTRAMUSCULAR | Status: AC
  Filled 2016-06-11: qty 1

## 2016-06-11 MED ORDER — SODIUM CHLORIDE 0.9 % IV SOLN
65.0000 mg/m2 | Freq: Once | INTRAVENOUS | Status: AC
  Administered 2016-06-11: 96 mg via INTRAVENOUS
  Filled 2016-06-11: qty 16

## 2016-06-11 MED ORDER — SODIUM CHLORIDE 0.9 % IV SOLN: 135.0000 mg | Freq: Once | INTRAVENOUS | Status: DC

## 2016-06-11 MED ORDER — FAMOTIDINE IN NACL 20-0.9 MG/50ML-% IV SOLN
INTRAVENOUS | Status: AC
  Filled 2016-06-11: qty 50

## 2016-06-11 MED ORDER — PALONOSETRON HCL INJECTION 0.25 MG/5ML
0.2500 mg | Freq: Once | INTRAVENOUS | Status: AC
Start: 2016-06-11 — End: 2016-06-11
  Administered 2016-06-11: 0.25 mg via INTRAVENOUS

## 2016-06-11 NOTE — Patient Instructions (Signed)
[facility] Discharge Instructions for Patients Receiving Chemotherapy  Today you received the following chemotherapy agents Taxol/Carboplatin  To help prevent nausea and vomiting after your treatment, we encourage you to take your nausea medication    If you develop nausea and vomiting that is not controlled by your nausea medication, call the clinic.   BELOW ARE SYMPTOMS THAT SHOULD BE REPORTED IMMEDIATELY:  *FEVER GREATER THAN 100.5 F  *CHILLS WITH OR WITHOUT FEVER  NAUSEA AND VOMITING THAT IS NOT CONTROLLED WITH YOUR NAUSEA MEDICATION  *UNUSUAL SHORTNESS OF BREATH  *UNUSUAL BRUISING OR BLEEDING  TENDERNESS IN MOUTH AND THROAT WITH OR WITHOUT PRESENCE OF ULCERS  *URINARY PROBLEMS  *BOWEL PROBLEMS  UNUSUAL RASH Items with * indicate a potential emergency and should be followed up as soon as possible.  Feel free to call the clinic you have any questions or concerns. The clinic phone number is [phone].  Please show the CHEMO ALERT CARD at check-in to the Emergency Department and triage nurse.   

## 2016-06-11 NOTE — Telephone Encounter (Signed)
Gave pt cal & avs °

## 2016-06-11 NOTE — Telephone Encounter (Signed)
Per staff phone call and POF I have schedueld appts. Scheduler advised of appts.  JMW  

## 2016-06-11 NOTE — Progress Notes (Signed)
[facility] OFFICE PROGRESS NOTE   Diagnosis:  Esophagus cancer  INTERVAL HISTORY:   Mr. Colasanti returns as scheduled. He has completed 1 cycle of weekly Taxol/carboplatin 4. Cycle 2 week one was held on 06/04/2016 due to mild neutropenia and mucositis. He has mild intermittent nausea. No mouth sores. No diarrhea. No dysphagia. He reports a good appetite. He has occasional numbness/tingling in the fingertips and toes. Cough continues to be improved.  Objective:  Vital signs in last 24 hours:  Blood pressure (!) 101/59, pulse 98, temperature 98.2 F (36.8 C), temperature source Oral, resp. rate 18, height '5\' 3"'$  (1.6 m), weight 107 lb 6.4 oz (48.7 kg).    HEENT: No thrush or ulcers. Resp: Breath sounds diminished right lower lung field. No respiratory distress Cardio: Regular rate and rhythm. GI: Abdomen soft and nontender. No hepatomegaly. Vascular: No leg edema.    Lab Results:  Lab Results  Component Value Date   WBC 5.7 06/11/2016   HGB 12.9 (L) 06/11/2016   HCT 40.0 06/11/2016   MCV 84.8 06/11/2016   PLT 219 06/11/2016   NEUTROABS 2.6 06/11/2016    Imaging:  No results found.  Medications: I have reviewed the patient's current medications.  Assessment/Plan: 1. Squamous cell carcinoma. Staging CT scans of the chest, abdomen and pelvis on 06/02/2012 negative for evidence of metastatic disease. Endoscopic ultrasound 06/11/2012 confirmed a uT3 uN1 tumor at 40 cm from the incisors. He began radiation 06/15/2012. He began weekly Taxol/carboplatin chemotherapy 06/16/2012. He completed 6 weekly chemotherapy treatments. The carboplatin was held with week 5 due to thrombocytopenia. He completed the sixth weekly treatment with Taxol and carboplatin on 07/23/2012. He completed radiation on 07/29/2012 -he underwent an esophagectomy on 08/26/2012 with the pathology confirming a ypT3,ypN0 squamous cell carcinoma with perineural invasion identified.  2. History of  solid/liquid dysphagia secondary to the obstructing esophagus mass.  3. Reflux. He is taking Protonix. 4. Hospitalization 07/19/2014 with abdominal pain and vomiting. CT scan showed a mid small bowel obstruction without discrete mass or evident etiology. Small bowel obstruction felt to be secondary to adhesions. 5. Renal failure during the 07/19/2014 hospitalization felt to be contrast-induced nephropathy. 6. Chest CT 06/15/2013 with postoperative changes from esophagectomy and gastric pull-through. 7 mm posterior subpleural nodule in the right lower lobe. Followup CT scan 12/16/2013 revealed an increase in the size of a right hilar node and right upper lobe nodule.  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  Chest CT 12/23/2014 with enlarged right hilar lymph node measuring 1.2 cm, previously 0.8 cm. Slight enlargement of a periaortic lymph node, 8 mm as compared to 7 mm on the previous study. Increased thickening of the right pleural rind. Moderate sized right pleural effusion.  Chest x-ray 11/28/2015, no change in right pleural effusion  Chest x-ray 04/18/2016 with increase in the right pleural effusion.  Thoracentesis 04/24/2016-only 25 mL of pleural fluid could be obtained  CT chest 04/24/2016-progressive mediastinal/hilar lymphadenopathy, large  right pleural effusion with mixed density, interstitial thickening in the right lower lung concerning for lymphatic tumor spread  Salvage chemotherapy with weekly Taxol/carboplatin initiated 04/30/2016  Cycle 2 weekly Taxol/carboplatin 06/11/2016 7. Headaches. Negative brain CT 12/23/2014.   Disposition: Mr. Daughtridge appears stable. Plan to proceed with cycle 2 weekly Taxol/carboplatin today as scheduled. We will see him in follow-up in 2 weeks. He will contact the office in the interim with any problems.    Ned Card ANP/GNP-BC   06/11/2016  11:49 AM

## 2016-06-16 ENCOUNTER — Other Ambulatory Visit: Payer: Self-pay | Admitting: Oncology

## 2016-06-18 ENCOUNTER — Ambulatory Visit (HOSPITAL_BASED_OUTPATIENT_CLINIC_OR_DEPARTMENT_OTHER): Payer: Medicare Other

## 2016-06-18 ENCOUNTER — Other Ambulatory Visit (HOSPITAL_BASED_OUTPATIENT_CLINIC_OR_DEPARTMENT_OTHER): Payer: Medicare Other

## 2016-06-18 VITALS — BP 92/63 | HR 114 | Temp 98.3°F | Resp 18

## 2016-06-18 DIAGNOSIS — C155 Malignant neoplasm of lower third of esophagus: Secondary | ICD-10-CM

## 2016-06-18 DIAGNOSIS — C7801 Secondary malignant neoplasm of right lung: Secondary | ICD-10-CM

## 2016-06-18 DIAGNOSIS — C779 Secondary and unspecified malignant neoplasm of lymph node, unspecified: Secondary | ICD-10-CM

## 2016-06-18 DIAGNOSIS — Z5111 Encounter for antineoplastic chemotherapy: Secondary | ICD-10-CM | POA: Diagnosis present

## 2016-06-18 DIAGNOSIS — C7800 Secondary malignant neoplasm of unspecified lung: Secondary | ICD-10-CM

## 2016-06-18 LAB — CBC WITH DIFFERENTIAL/PLATELET
BASO%: 0.7 % (ref 0.0–2.0)
BASOS ABS: 0 10*3/uL (ref 0.0–0.1)
EOS ABS: 0.1 10*3/uL (ref 0.0–0.5)
EOS%: 1.5 % (ref 0.0–7.0)
HCT: 42.5 % (ref 38.4–49.9)
HGB: 13.5 g/dL (ref 13.0–17.1)
LYMPH%: 43.5 % (ref 14.0–49.0)
MCH: 27.2 pg (ref 27.2–33.4)
MCHC: 31.8 g/dL — ABNORMAL LOW (ref 32.0–36.0)
MCV: 85.7 fL (ref 79.3–98.0)
MONO#: 0.4 10*3/uL (ref 0.1–0.9)
MONO%: 11.3 % (ref 0.0–14.0)
NEUT#: 1.6 10*3/uL (ref 1.5–6.5)
NEUT%: 43 % (ref 39.0–75.0)
PLATELETS: 167 10*3/uL (ref 140–400)
RBC: 4.96 10*6/uL (ref 4.20–5.82)
RDW: 16.8 % — ABNORMAL HIGH (ref 11.0–14.6)
WBC: 3.8 10*3/uL — ABNORMAL LOW (ref 4.0–10.3)
lymph#: 1.7 10*3/uL (ref 0.9–3.3)

## 2016-06-18 LAB — COMPREHENSIVE METABOLIC PANEL
ALK PHOS: 106 U/L (ref 40–150)
ALT: 37 U/L (ref 0–55)
ANION GAP: 8 meq/L (ref 3–11)
AST: 36 U/L — ABNORMAL HIGH (ref 5–34)
Albumin: 3.7 g/dL (ref 3.5–5.0)
BILIRUBIN TOTAL: 0.57 mg/dL (ref 0.20–1.20)
BUN: 9.9 mg/dL (ref 7.0–26.0)
CO2: 24 meq/L (ref 22–29)
Calcium: 9.6 mg/dL (ref 8.4–10.4)
Chloride: 104 mEq/L (ref 98–109)
Creatinine: 1 mg/dL (ref 0.7–1.3)
EGFR: 86 mL/min/{1.73_m2} — AB (ref >90)
GLUCOSE: 174 mg/dL — AB (ref 70–140)
POTASSIUM: 3.9 meq/L (ref 3.5–5.1)
SODIUM: 136 meq/L (ref 136–145)
Total Protein: 8.3 g/dL (ref 6.4–8.3)

## 2016-06-18 MED ORDER — SODIUM CHLORIDE 0.9 % IV SOLN
10.0000 mg | Freq: Once | INTRAVENOUS | Status: AC
  Administered 2016-06-18: 10 mg via INTRAVENOUS
  Filled 2016-06-18: qty 1

## 2016-06-18 MED ORDER — PALONOSETRON HCL INJECTION 0.25 MG/5ML
INTRAVENOUS | Status: AC
  Filled 2016-06-18: qty 5

## 2016-06-18 MED ORDER — DIPHENHYDRAMINE HCL 50 MG/ML IJ SOLN
INTRAMUSCULAR | Status: AC
  Filled 2016-06-18: qty 1

## 2016-06-18 MED ORDER — SODIUM CHLORIDE 0.9 % IV SOLN
Freq: Once | INTRAVENOUS | Status: AC
  Administered 2016-06-18: 14:00:00 via INTRAVENOUS

## 2016-06-18 MED ORDER — FAMOTIDINE IN NACL 20-0.9 MG/50ML-% IV SOLN
20.0000 mg | Freq: Once | INTRAVENOUS | Status: AC
Start: 2016-06-18 — End: 2016-06-18
  Administered 2016-06-18: 20 mg via INTRAVENOUS

## 2016-06-18 MED ORDER — PALONOSETRON HCL INJECTION 0.25 MG/5ML
0.2500 mg | Freq: Once | INTRAVENOUS | Status: AC
  Administered 2016-06-18: 0.25 mg via INTRAVENOUS

## 2016-06-18 MED ORDER — FAMOTIDINE IN NACL 20-0.9 MG/50ML-% IV SOLN
INTRAVENOUS | Status: AC
  Filled 2016-06-18: qty 50

## 2016-06-18 MED ORDER — SODIUM CHLORIDE 0.9 % IV SOLN
135.0000 mg | Freq: Once | INTRAVENOUS | Status: AC
  Administered 2016-06-18: 140 mg via INTRAVENOUS
  Filled 2016-06-18: qty 14

## 2016-06-18 MED ORDER — SODIUM CHLORIDE 0.9 % IV SOLN
65.0000 mg/m2 | Freq: Once | INTRAVENOUS | Status: AC
  Administered 2016-06-18: 96 mg via INTRAVENOUS
  Filled 2016-06-18: qty 16

## 2016-06-18 MED ORDER — DIPHENHYDRAMINE HCL 50 MG/ML IJ SOLN
25.0000 mg | Freq: Once | INTRAMUSCULAR | Status: AC
Start: 2016-06-18 — End: 2016-06-18
  Administered 2016-06-18: 25 mg via INTRAVENOUS

## 2016-06-18 NOTE — Progress Notes (Signed)
Ok to treat with HR of 114 per Dr. Benay Spice.   Ma Hillock, RN

## 2016-06-18 NOTE — Patient Instructions (Signed)
[facility] Discharge Instructions for Patients Receiving Chemotherapy  Today you received the following chemotherapy agents Taxol/Carboplatin  To help prevent nausea and vomiting after your treatment, we encourage you to take your nausea medication    If you develop nausea and vomiting that is not controlled by your nausea medication, call the clinic.   BELOW ARE SYMPTOMS THAT SHOULD BE REPORTED IMMEDIATELY:  *FEVER GREATER THAN 100.5 F  *CHILLS WITH OR WITHOUT FEVER  NAUSEA AND VOMITING THAT IS NOT CONTROLLED WITH YOUR NAUSEA MEDICATION  *UNUSUAL SHORTNESS OF BREATH  *UNUSUAL BRUISING OR BLEEDING  TENDERNESS IN MOUTH AND THROAT WITH OR WITHOUT PRESENCE OF ULCERS  *URINARY PROBLEMS  *BOWEL PROBLEMS  UNUSUAL RASH Items with * indicate a potential emergency and should be followed up as soon as possible.  Feel free to call the clinic you have any questions or concerns. The clinic phone number is [phone].  Please show the CHEMO ALERT CARD at check-in to the Emergency Department and triage nurse.   

## 2016-06-23 ENCOUNTER — Other Ambulatory Visit: Payer: Self-pay | Admitting: Oncology

## 2016-06-25 ENCOUNTER — Ambulatory Visit (HOSPITAL_BASED_OUTPATIENT_CLINIC_OR_DEPARTMENT_OTHER): Payer: Medicare Other | Admitting: Nurse Practitioner

## 2016-06-25 ENCOUNTER — Ambulatory Visit: Payer: Medicare Other

## 2016-06-25 ENCOUNTER — Other Ambulatory Visit (HOSPITAL_BASED_OUTPATIENT_CLINIC_OR_DEPARTMENT_OTHER): Payer: Medicare Other

## 2016-06-25 ENCOUNTER — Telehealth: Payer: Self-pay | Admitting: Nurse Practitioner

## 2016-06-25 VITALS — BP 93/64 | HR 108 | Temp 97.9°F | Resp 16 | Ht 63.0 in | Wt 106.0 lb

## 2016-06-25 DIAGNOSIS — C7801 Secondary malignant neoplasm of right lung: Secondary | ICD-10-CM | POA: Diagnosis not present

## 2016-06-25 DIAGNOSIS — R200 Anesthesia of skin: Secondary | ICD-10-CM | POA: Diagnosis not present

## 2016-06-25 DIAGNOSIS — C155 Malignant neoplasm of lower third of esophagus: Secondary | ICD-10-CM

## 2016-06-25 DIAGNOSIS — D709 Neutropenia, unspecified: Secondary | ICD-10-CM

## 2016-06-25 DIAGNOSIS — C7800 Secondary malignant neoplasm of unspecified lung: Secondary | ICD-10-CM

## 2016-06-25 DIAGNOSIS — R05 Cough: Secondary | ICD-10-CM | POA: Diagnosis not present

## 2016-06-25 LAB — CBC WITH DIFFERENTIAL/PLATELET
BASO%: 0.6 % (ref 0.0–2.0)
BASOS ABS: 0 10*3/uL (ref 0.0–0.1)
EOS%: 1.1 % (ref 0.0–7.0)
Eosinophils Absolute: 0 10*3/uL (ref 0.0–0.5)
HCT: 39.6 % (ref 38.4–49.9)
HGB: 12.7 g/dL — ABNORMAL LOW (ref 13.0–17.1)
LYMPH#: 1.7 10*3/uL (ref 0.9–3.3)
LYMPH%: 57.8 % — AB (ref 14.0–49.0)
MCH: 27.4 pg (ref 27.2–33.4)
MCHC: 32.1 g/dL (ref 32.0–36.0)
MCV: 85.3 fL (ref 79.3–98.0)
MONO#: 0.3 10*3/uL (ref 0.1–0.9)
MONO%: 9.7 % (ref 0.0–14.0)
NEUT#: 0.9 10*3/uL — ABNORMAL LOW (ref 1.5–6.5)
NEUT%: 30.8 % — AB (ref 39.0–75.0)
Platelets: 172 10*3/uL (ref 140–400)
RBC: 4.64 10*6/uL (ref 4.20–5.82)
RDW: 17.1 % — ABNORMAL HIGH (ref 11.0–14.6)
WBC: 3 10*3/uL — ABNORMAL LOW (ref 4.0–10.3)

## 2016-06-25 LAB — COMPREHENSIVE METABOLIC PANEL
ALT: 27 U/L (ref 0–55)
AST: 28 U/L (ref 5–34)
Albumin: 3.3 g/dL — ABNORMAL LOW (ref 3.5–5.0)
Alkaline Phosphatase: 92 U/L (ref 40–150)
Anion Gap: 9 mEq/L (ref 3–11)
BUN: 9.1 mg/dL (ref 7.0–26.0)
CHLORIDE: 105 meq/L (ref 98–109)
CO2: 21 meq/L — AB (ref 22–29)
CREATININE: 1 mg/dL (ref 0.7–1.3)
Calcium: 9 mg/dL (ref 8.4–10.4)
EGFR: >90 mL/min/{1.73_m2} (ref >90)
Glucose: 247 mg/dl — ABNORMAL HIGH (ref 70–140)
POTASSIUM: 3.9 meq/L (ref 3.5–5.1)
Sodium: 135 mEq/L — ABNORMAL LOW (ref 136–145)
Total Bilirubin: 0.49 mg/dL (ref 0.20–1.20)
Total Protein: 7.3 g/dL (ref 6.4–8.3)

## 2016-06-25 NOTE — Telephone Encounter (Signed)
Gave pt cal & avs °

## 2016-06-25 NOTE — Progress Notes (Signed)
[place] OFFICE PROGRESS NOTE   Diagnosis:  Esophagus cancer  INTERVAL HISTORY:   Bruce [name] returns as scheduled. He completed cycle 2 week 2 Taxol/carboplatin 06/18/2016. He denies nausea/vomiting. No mouth sores. No diarrhea. He has mild intermittent numbness in the big toes bilaterally. No numbness or tailing in the hands. Cough continues to be improved. He has occasional shortness of breath. For the past 2-3 weeks he has felt "cold" in the evening. He has not checked his temperature.  Objective:  Vital signs in last 24 hours:  Blood pressure 93/64, pulse (!) 108, temperature 97.9 F (36.6 C), temperature source Oral, resp. rate 16, height '5\' 3"'$  (1.6 m), weight 106 lb (48.1 kg), SpO2 99 %.    HEENT: No thrush or ulcers. Resp: Breath sounds diminished right lower lung field. No respiratory distress. Cardio: Regular rate and rhythm. No JVD. GI: Abdomen soft and nontender. No hepatomegaly. Vascular: No leg edema. Neuro: Vibratory sense moderately decreased over the fingertips per tuning fork exam.  Skin: No rash.    Lab Results:  Lab Results  Component Value Date   WBC 3.0 (L) 06/25/2016   HGB 12.7 (L) 06/25/2016   HCT 39.6 06/25/2016   MCV 85.3 06/25/2016   PLT 172 06/25/2016   NEUTROABS 0.9 (L) 06/25/2016    Imaging:  No results found.  Medications: I have reviewed the patient's current medications.  Assessment/Plan: 1. Squamous cell carcinoma. Staging CT scans of the chest, abdomen and pelvis on 06/02/2012 negative for evidence of metastatic disease. Endoscopic ultrasound 06/11/2012 confirmed a uT3 uN1 tumor at 40 cm from the incisors. He began radiation 06/15/2012. He began weekly Taxol/carboplatin chemotherapy 06/16/2012. He completed 6 weekly chemotherapy treatments. The carboplatin was held with week 5 due to thrombocytopenia. He completed the sixth weekly treatment with Taxol and carboplatin on 07/23/2012. He completed radiation on 07/29/2012  -he underwent an esophagectomy on 08/26/2012 with the pathology confirming a ypT3,ypN0 squamous cell carcinoma with perineural invasion identified.  2. History of solid/liquid dysphagia secondary to the obstructing esophagus mass.  3. Reflux. He is taking Protonix. 4. Hospitalization 07/19/2014 with abdominal pain and vomiting. CT scan showed a mid small bowel obstruction without discrete mass or evident etiology. Small bowel obstruction felt to be secondary to adhesions. 5. Renal failure during the 07/19/2014 hospitalization felt to be contrast-induced nephropathy. 6. Chest CT 06/15/2013 with postoperative changes from esophagectomy and gastric pull-through. 7 mm posterior subpleural nodule in the right lower lobe. Followup CT scan 12/16/2013 revealed an increase in the size of a right hilar node and right upper lobe nodule.  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  Chest CT 12/23/2014 with enlarged right hilar lymph node measuring 1.2 cm, previously 0.8 cm. Slight enlargement of a periaortic lymph node, 8 mm as compared to 7 mm on the previous study. Increased thickening of the right pleural rind. Moderate sized right pleural effusion.  Chest x-ray  11/28/2015, no change in right pleural effusion  Chest x-ray 04/18/2016 with increase in the right  pleural effusion.  Thoracentesis 04/24/2016-only 25 mL of pleural fluid could be obtained  CT chest 04/24/2016-progressive mediastinal/hilar lymphadenopathy, large right pleural effusion with mixed density, interstitial thickening in the right lower lung concerning for lymphatic tumor spread  Salvage chemotherapy with weekly Taxol/carboplatin initiated 04/30/2016  Cycle 2 weekly Taxol/carboplatin 06/11/2016 (chemotherapy dose reduced due to neutropenia); week 3 held due to neutropenia. 7. Headaches. Negative brain CT 12/23/2014.   Disposition: [name] appears stable. He completed cycle 2 week 2 Taxol/carboplatin last week. He is neutropenic on labs today. We will hold today's treatment. I discussed neutropenic precautions with [name] and his daughter. They will obtain a thermometer. They understand to contact the office with fever greater than or equal to 100.5, shaking chills, other signs of infection.  He will return for a follow-up visit and chemotherapy in one week. He will contact the office in the interim as outlined above or with any other problems.  Plan reviewed with Dr. Benay Spice.   Ned Card ANP/GNP-BC   06/25/2016  12:24 PM

## 2016-06-27 ENCOUNTER — Telehealth: Payer: Self-pay

## 2016-06-27 ENCOUNTER — Ambulatory Visit (INDEPENDENT_AMBULATORY_CARE_PROVIDER_SITE_OTHER): Payer: BLUE CROSS/BLUE SHIELD | Admitting: Cardiothoracic Surgery

## 2016-06-27 ENCOUNTER — Encounter: Payer: Self-pay | Admitting: Cardiothoracic Surgery

## 2016-06-27 VITALS — BP 88/60 | HR 99 | Resp 16 | Ht 63.0 in | Wt 107.4 lb

## 2016-06-27 DIAGNOSIS — Z9049 Acquired absence of other specified parts of digestive tract: Secondary | ICD-10-CM

## 2016-06-27 DIAGNOSIS — C155 Malignant neoplasm of lower third of esophagus: Secondary | ICD-10-CM

## 2016-06-27 DIAGNOSIS — C3411 Malignant neoplasm of upper lobe, right bronchus or lung: Secondary | ICD-10-CM | POA: Diagnosis not present

## 2016-06-27 DIAGNOSIS — Z9889 Other specified postprocedural states: Secondary | ICD-10-CM | POA: Diagnosis not present

## 2016-06-27 NOTE — Progress Notes (Signed)
[facility] Record #[id number] Date of Birth: Apr 01, 1961  Referring: Ladell Pier, MD Primary Care: No PCP Per Patient  Chief Complaint:    Chief Complaint  Patient presents with  . Routine Post Op    6 month f/.Marland KitchenMarland KitchenCT CHEST 04/24/16    History of Present Illness:    Bruce [name] 55 y.o. 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 On followup scans the patient was noted to have an increasing right lung lesion.  The patient underwent  wedge resection of right lung lesion and node dissection, final pathology is suggestive of squamous cell from known metastatic from esophageal primary in march 2015 .  Metastatic squamous cell carcinoma to lung 2 months ago the patient noted significant increase in persistent cough that was not relieved with increasing opacity in the right chest and effusion, chemotherapy was started He completed cycle 2 week 2 Taxol/carboplatin last week. He is neutropenic on labs 06/25/2016 . Third cycle held     Primary site: Esophagus - Squamous Cell Carcinoma   Staging method: AJCC 7th Edition   Clinical: (M1)   Pathologic free text: Stage IV  pM1   Summary: (M1) Malignant neoplasm of lower third of esophagus   Primary site: Esophagus - Adenocarcinoma   Staging method: AJCC 7th Edition   Clinical: Stage IIIA (T3, N1, M0)   Pathologic: Stage IIB (T3, N0, cM0) signed by [name], MD on 08/31/2012  1:05 PM   Summary: Stage IIB (T3, N0, cM0)  Current Activity/ Functional Status:  Patient is independent with mobility/ambulation, transfers, ADL's, IADL's.   Zubrod Score: At the time of surgery this patient's most appropriate activity status/level should be described as: '[]'$     0    Normal activity, no symptoms '[x]'$     1    Restricted in physical strenuous  activity but ambulatory, able to do out light work '[]'$     2    Ambulatory and capable of self care, unable to do work activities, up and about               >50 % of waking hours                              '[]'$     3    Only limited self care, in bed greater than 50% of waking hours '[]'$     4    Completely disabled, no self care, confined to bed or chair '[]'$     5    Moribund   Past Medical History:  Diagnosis Date  . Cancer ([place]) 05/28/12   bx=esophagus=squamous cell carcinoma  . Dysphasia    solid and liquid  . Esophagitis   . Lung cancer ([place]) 12/27/13   right upper lung  . Mass of esophagus 05/28/2012   BX'D DISTAL ESOPHAGUS,PENDING  . Neuropathy ([place])    right hand numbness 1 year 2012    Past Surgical History:  Procedure Laterality Date  . COMPLETE ESOPHAGECTOMY  08/26/2012   Procedure: ESOPHAGECTOMY COMPLETE;  Surgeon: [name], MD;  Location: [facility];  Service: Thoracic;  Laterality: N/A;  transhiatal   . 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 EUS  06/11/2012   Procedure: UPPER ENDOSCOPIC ULTRASOUND (EUS) RADIAL;  Surgeon: Milus Banister, MD;  Location: [facility] ENDOSCOPY;  Service: Endoscopy;  Laterality: N/A;  . JEJUNOSTOMY  08/26/2012   Procedure: JEJUNOSTOMY;  Surgeon: [name], MD;  Location: [facility];  Service: Thoracic;  Laterality: N/A;  placement of feeding jujunostomy tube  . LYMPH NODE DISSECTION Right 01/31/2014   Procedure: LYMPH NODE DISSECTION;  Surgeon: [name], MD;  Location: [place];  Service: Thoracic;  Laterality: Right;  . PYLOROPLASTY  08/26/2012   Procedure: PYLOROPLASTY;  Surgeon: [name], MD;  Location: [facility];  Service: Thoracic;  Laterality: N/A;  . VIDEO ASSISTED THORACOSCOPY (VATS)/WEDGE RESECTION Right 01/31/2014   Procedure: VIDEO ASSISTED THORACOSCOPY (VATS)/WEDGE RESECTION; with insertion of On Q pain pump;  Surgeon: [name], MD;   Location: [place];  Service: Thoracic;  Laterality: Right;  with insertion of On Q pain pump  . VIDEO BRONCHOSCOPY  08/26/2012   Procedure: VIDEO BRONCHOSCOPY;  Surgeon: [name], MD;  Location: [facility];  Service: Thoracic;  Laterality: N/A;  . VIDEO BRONCHOSCOPY N/A 01/31/2014   Procedure: VIDEO BRONCHOSCOPY;  Surgeon: [name], MD;  Location: [facility] OR;  Service: Thoracic;  Laterality: N/A;    Family History  Problem Relation Age of Onset  . Breast cancer Sister     History   Social History  . Marital Status: Married    Spouse Name: N/A    Number of Children: N/A  . Years of Education: N/A   Occupational History  . welder    Social History Main Topics  . Smoking status: Former Smoker -- 0.40 packs/day for 25 years    Types: Cigarettes    Quit date: 06/19/2011  . Smokeless tobacco: Former Systems developer    Quit date: 06/03/2007  . Alcohol Use: No     Comment: not currently (01/28/14)  . Drug Use: No  . Sexual Activity: Not on file      History  Smoking Status  . Former Smoker  . Packs/day: 0.40  . Years: 25.00  . Types: Cigarettes  . Quit date: 06/19/2011  Smokeless Tobacco  . Former Systems developer  . Quit date: 06/03/2007    History  Alcohol Use No    Comment: not currently (01/28/14)     No Known Allergies  Current Outpatient Prescriptions  Medication Sig Dispense Refill  . albuterol (PROVENTIL HFA;VENTOLIN HFA) 108 (90 Base) MCG/ACT inhaler Inhale 2 puffs into the lungs every 4 (four) hours as needed for wheezing or shortness of breath. 1 Inhaler 1  . benzonatate (TESSALON PERLES) 100 MG capsule Take 2 capsules (200 mg total) by mouth 3 (three) times daily as needed for cough. 90 capsule 0  . LORazepam (ATIVAN) 0.5 MG tablet Take 1 tablet (0.5 mg total) by mouth at bedtime as needed for anxiety. 30 tablet 0  . pantoprazole (PROTONIX) 40 MG tablet Take 1 tablet (40 mg total) by mouth daily. 30 tablet 5  . Phenylephrine-Pheniramine-DM (THERAFLU COLD & COUGH PO) Take by  mouth.    . polyethylene glycol (MIRALAX) packet Take 17 g by mouth daily. 30 each 0  . prochlorperazine (COMPAZINE) 10 MG tablet Take 1 tablet (10 mg total) by mouth every 6 (six) hours as needed for nausea or vomiting. 30 tablet 0  . temazepam (RESTORIL) 15 MG capsule Take 1 capsule (15 mg total) by mouth at bedtime as needed for sleep. 30 capsule 0   No current facility-administered medications for this  visit.      Review of Systems:     Cardiac Review of Systems: Y or N  Chest Pain [ n   ]  Resting SOB [ n  ] Exertional SOB  [n  ]  Orthopnea [ n ]   Pedal Edema [ n  ]    Palpitations [ n ] Syncope  [ n ]   Presyncope [  n ]  General Review of Systems: [Y] = yes [  ]=no Constitional: recent weight change [n  ];  Wt loss over the last 3 months [   ] anorexia [  ]; fatigue Blue.Reese  ]; nausea [ n ]; night sweats [ n ]; fever [ n ]; or chills [n  ];          Dental: poor dentition[n  ]; Last Dentist visit:   Eye : blurred vision [  ]; diplopia [   ]; vision changes [  ];  Amaurosis fugax[  ]; Resp: cough [  ];  wheezing[n  ];  hemoptysis[ n ]; shortness of breath[ n ]; paroxysmal nocturnal dyspnea[ n ]; dyspnea on exertion[ n ]; or orthopnea[  ];  GI:  gallstones[  ], vomiting[ n ];  dysphagia[  ]; melena[ n ];  hematochezia [n  ]; heartburn[ y ];   Hx of  Colonoscopy[  ]; GU: kidney stones [  ]; hematuria[  ];   dysuria [n  ];  nocturia[  ];  history of     obstruction [ n ]; urinary frequency [ n ]             Skin: rash, swelling[  ];, hair loss[  ];  peripheral edema[  ];  or itching[  ]; Musculosketetal: myalgias[  ];  joint swelling[  ];  joint erythema[  ];  joint pain[  ];  back pain[  ];  Heme/Lymph: bruising[  ];  bleeding[n  ];  anemia[  ];  Neuro: TIA[  ];  headaches[  ];  stroke[ n ];  vertigo[  ];  seizures[  ];   paresthesias[  ];  difficulty walking[n  ];  Psych:depression[  ]; anxiety[  ];  Endocrine: diabetes[ n ];  thyroid dysfunction[n  ];  Immunizations: Flu up to date Blue.Reese   ]; Pneumococcal up to date [  y];  Other:  Physical Exam: BP (!) 88/60   Pulse 99   Resp 16   Ht '5\' 3"'$  (1.6 m)   Wt 107 lb 6.4 oz (48.7 kg)   SpO2 98% Comment: ON RA  BMI 19.03 kg/m    PHYSICAL EXAMINATION:  Wt Readings from Last 3 Encounters:  06/27/16 107 lb 6.4 oz (48.7 kg)  06/25/16 106 lb (48.1 kg)  06/11/16 107 lb 6.4 oz (48.7 kg)   General appearance: alert, cooperative, appears stated age and no distress Neurologic: intact Heart: regular rate and rhythm, S1, S2 normal, no murmur, click, rub or gallop Lungs: clear to auscultation bilaterally Abdomen: soft, non-tender; bowel sounds normal; no masses,  no organomegaly abdominal incisions well-healed without incisional hernia Extremities: extremities normal, atraumatic, no cyanosis or edema and Homans sign is negative, no sign of DVT Wound: Patient's incisions in the left neck and mid abdomen are well-healed, The right chest incision is well-healed without evidence of infection Patient has no cervical or supraclavicular or axillary adenopathy.  Diagnostic Studies & Laboratory data:     Recent Radiology Findings:  CLINICAL DATA:  History is esophageal cancer, pleural effusion.  EXAM: CHEST  2 VIEW  COMPARISON:  Radiograph of April 24, 2016.  FINDINGS: Stable cardiomediastinal silhouette. No pneumothorax is noted. Moderate loculated right pleural effusion is noted which is decreased compared to prior exam. There is associated atelectasis or scarring seen in the right lung. Stable interstitial densities are noted throughout the left lung base consistent with chronic interstitial lung disease or scarring. Bony thorax is unremarkable.  IMPRESSION: Moderate loculated right pleural effusion is noted which is slightly decreased compared to prior exam with associated atelectasis or scarring in the right lung.   Electronically Signed   By: Marijo Conception, M.D.   On: 06/04/2016 09:15  CLINICAL DATA: Cough and  congestion for 1 week, worsening. History of esophagectomy for carcinoma.     Nm Pet Image Restag (ps) Skull Base To Thigh  01/11/2014   CLINICAL DATA:  Subsequent treatment strategy for esophageal cancer. Recent biopsy of a cavitary right upper lobe lung lesion revealed invasive squamous cell carcinoma.  EXAM: NUCLEAR MEDICINE PET SKULL BASE TO THIGH  FASTING BLOOD GLUCOSE:  Value: 92 mg/dl  TECHNIQUE: 6.3 mCi F-18 FDG was injected intravenously. Full-ring PET imaging was performed from the skull base to thigh after the radiotracer. CT data was obtained and used for attenuation correction and anatomic localization.  COMPARISON:  CT BIOPSY dated 12/27/2013  FINDINGS: NECK  No hypermetabolic lymph nodes in the neck.  CHEST  Gastric pull-through noted. Cavitary posterior right upper lobe mass, 2.1 x 1.5 cm, maximum standard uptake value 7.7. Posterior left infrahilar lymph node, 2.3 x 2.0 cm, maximum standard uptake value 9.8. Adjacent indistinct infrahilar node has maximum standard uptake value of 5.9.  A vascular structure or small lymph node posterior to the descending thoracic aorta has a maximum standard uptake value of 3.4 and is probably incidental.  ABDOMEN/PELVIS  Mid transverse colon has some faintly increased signal but is also small in caliber, unless this increased signal (standard uptake value 6.1) is probably physiologic. Mildly prominent but gas-filled appendix does not appear actively inflamed. It is  SKELETON  No focal hypermetabolic activity to suggest skeletal metastasis.  IMPRESSION: 1. Hypermetabolic cavitary right upper lobe mass corresponding to the known invasive squamous cell carcinoma. Pathologic right infrahilar adenopathy. 2. Faintly increased activity in the mid transverse colon is probably physiologic.   Electronically Signed   By: Sherryl Barters M.D.   On: 01/11/2014 11:46   I have independently reviewed the above radiology studies  and reviewed the findings with the  patient.   Recent Lab Findings: Lab Results  Component Value Date   WBC 3.0 (L) 06/25/2016   HGB 12.7 (L) 06/25/2016   HCT 39.6 06/25/2016   PLT 172 06/25/2016   GLUCOSE 247 (H) 06/25/2016   ALT 27 06/25/2016   AST 28 06/25/2016   NA 135 (L) 06/25/2016   K 3.9 06/25/2016   CL 100 07/26/2014   CREATININE 1.0 06/25/2016   BUN 9.1 06/25/2016   CO2 21 (L) 06/25/2016   TSH 0.853 07/20/2014   INR 1.13 07/21/2014   HGBA1C 6.2 (H) 07/20/2014      Assessment / Plan:   Advanced stage esophageal cancer, currently on chemo therapy with neutropenia.  Patient is maintained his nutritional status adequately   Patient notes that after 2 cycles of chemotherapy the cough that had been bothering him and actually improved he scheduled to have a follow-up chest x-ray in 2 weeks I plan to see him back in 6 months or as needed or requested  by Dr. Benay Spice.     [name] MD      [address] [place] [address] Office [phone]   Beeper [phone]  06/27/2016 9:42 AM

## 2016-06-27 NOTE — Telephone Encounter (Signed)
Left message for patient/ patients daughter to call back regarding lab results.

## 2016-06-27 NOTE — Telephone Encounter (Signed)
-----   Message from Owens Shark, NP sent at 06/27/2016  2:38 PM EDT ----- Please let his daughter know blood sugar was elevated. He needs to avoid concentrated sweets.

## 2016-06-30 ENCOUNTER — Other Ambulatory Visit: Payer: Self-pay | Admitting: Oncology

## 2016-07-02 ENCOUNTER — Other Ambulatory Visit (HOSPITAL_BASED_OUTPATIENT_CLINIC_OR_DEPARTMENT_OTHER): Payer: BLUE CROSS/BLUE SHIELD

## 2016-07-02 ENCOUNTER — Other Ambulatory Visit: Payer: Medicare Other

## 2016-07-02 ENCOUNTER — Other Ambulatory Visit: Payer: Self-pay | Admitting: Oncology

## 2016-07-02 ENCOUNTER — Other Ambulatory Visit: Payer: Self-pay | Admitting: *Deleted

## 2016-07-02 ENCOUNTER — Ambulatory Visit: Payer: BLUE CROSS/BLUE SHIELD | Admitting: Nurse Practitioner

## 2016-07-02 ENCOUNTER — Encounter: Payer: Self-pay | Admitting: Nurse Practitioner

## 2016-07-02 ENCOUNTER — Ambulatory Visit (HOSPITAL_BASED_OUTPATIENT_CLINIC_OR_DEPARTMENT_OTHER): Payer: BLUE CROSS/BLUE SHIELD

## 2016-07-02 ENCOUNTER — Ambulatory Visit (HOSPITAL_BASED_OUTPATIENT_CLINIC_OR_DEPARTMENT_OTHER): Payer: BLUE CROSS/BLUE SHIELD | Admitting: Nurse Practitioner

## 2016-07-02 VITALS — BP 97/59 | HR 100 | Temp 97.7°F | Resp 16 | Ht 63.0 in | Wt 107.3 lb

## 2016-07-02 VITALS — BP 99/74 | HR 105 | Temp 97.5°F | Resp 18

## 2016-07-02 DIAGNOSIS — C155 Malignant neoplasm of lower third of esophagus: Secondary | ICD-10-CM

## 2016-07-02 DIAGNOSIS — Z5111 Encounter for antineoplastic chemotherapy: Secondary | ICD-10-CM

## 2016-07-02 DIAGNOSIS — D709 Neutropenia, unspecified: Secondary | ICD-10-CM | POA: Diagnosis not present

## 2016-07-02 DIAGNOSIS — C7801 Secondary malignant neoplasm of right lung: Secondary | ICD-10-CM

## 2016-07-02 DIAGNOSIS — C7800 Secondary malignant neoplasm of unspecified lung: Secondary | ICD-10-CM

## 2016-07-02 DIAGNOSIS — R05 Cough: Secondary | ICD-10-CM | POA: Diagnosis not present

## 2016-07-02 DIAGNOSIS — T7840XA Allergy, unspecified, initial encounter: Secondary | ICD-10-CM

## 2016-07-02 LAB — COMPREHENSIVE METABOLIC PANEL
ALT: 29 U/L (ref 0–55)
ANION GAP: 6 meq/L (ref 3–11)
AST: 37 U/L — AB (ref 5–34)
Albumin: 3.1 g/dL — ABNORMAL LOW (ref 3.5–5.0)
Alkaline Phosphatase: 83 U/L (ref 40–150)
BILIRUBIN TOTAL: 0.59 mg/dL (ref 0.20–1.20)
BUN: 8.2 mg/dL (ref 7.0–26.0)
CALCIUM: 8.9 mg/dL (ref 8.4–10.4)
CO2: 24 meq/L (ref 22–29)
CREATININE: 0.9 mg/dL (ref 0.7–1.3)
Chloride: 104 mEq/L (ref 98–109)
EGFR: >90 mL/min/{1.73_m2} (ref >90)
Glucose: 176 mg/dl — ABNORMAL HIGH (ref 70–140)
Potassium: 3.7 mEq/L (ref 3.5–5.1)
SODIUM: 135 meq/L — AB (ref 136–145)
TOTAL PROTEIN: 7.5 g/dL (ref 6.4–8.3)

## 2016-07-02 LAB — CBC WITH DIFFERENTIAL/PLATELET
BASO%: 0.8 % (ref 0.0–2.0)
Basophils Absolute: 0 10*3/uL (ref 0.0–0.1)
EOS ABS: 0 10*3/uL (ref 0.0–0.5)
EOS%: 0.6 % (ref 0.0–7.0)
HCT: 39.2 % (ref 38.4–49.9)
HEMOGLOBIN: 12.7 g/dL — AB (ref 13.0–17.1)
LYMPH%: 52.5 % — AB (ref 14.0–49.0)
MCH: 27.3 pg (ref 27.2–33.4)
MCHC: 32.3 g/dL (ref 32.0–36.0)
MCV: 84.6 fL (ref 79.3–98.0)
MONO#: 0.7 10*3/uL (ref 0.1–0.9)
MONO%: 16.9 % — AB (ref 0.0–14.0)
NEUT%: 29.2 % — ABNORMAL LOW (ref 39.0–75.0)
NEUTROS ABS: 1.3 10*3/uL — AB (ref 1.5–6.5)
Platelets: 203 10*3/uL (ref 140–400)
RBC: 4.64 10*6/uL (ref 4.20–5.82)
RDW: 18.2 % — AB (ref 11.0–14.6)
WBC: 4.3 10*3/uL (ref 4.0–10.3)
lymph#: 2.3 10*3/uL (ref 0.9–3.3)

## 2016-07-02 LAB — TECHNOLOGIST REVIEW

## 2016-07-02 MED ORDER — FAMOTIDINE IN NACL 20-0.9 MG/50ML-% IV SOLN
INTRAVENOUS | Status: AC
  Filled 2016-07-02: qty 50

## 2016-07-02 MED ORDER — DIPHENHYDRAMINE HCL 50 MG/ML IJ SOLN
INTRAMUSCULAR | Status: AC
  Filled 2016-07-02: qty 1

## 2016-07-02 MED ORDER — LORAZEPAM 2 MG/ML IJ SOLN
INTRAMUSCULAR | Status: AC
  Filled 2016-07-02: qty 1

## 2016-07-02 MED ORDER — LORAZEPAM 2 MG/ML IJ SOLN
0.5000 mg | Freq: Once | INTRAMUSCULAR | Status: AC
  Administered 2016-07-02: 0.5 mg via INTRAVENOUS

## 2016-07-02 MED ORDER — SODIUM CHLORIDE 0.9 % IV SOLN
Freq: Once | INTRAVENOUS | Status: AC
  Administered 2016-07-02: 17:00:00 via INTRAVENOUS

## 2016-07-02 MED ORDER — SODIUM CHLORIDE 0.9 % IV SOLN
Freq: Once | INTRAVENOUS | Status: AC
  Administered 2016-07-02: 14:00:00 via INTRAVENOUS

## 2016-07-02 MED ORDER — SODIUM CHLORIDE 0.9 % IV SOLN
135.0000 mg | Freq: Once | INTRAVENOUS | Status: AC
  Administered 2016-07-02: 140 mg via INTRAVENOUS
  Filled 2016-07-02: qty 14

## 2016-07-02 MED ORDER — TEMAZEPAM 15 MG PO CAPS: 15.0000 mg | ORAL_CAPSULE | Freq: Every evening | ORAL | 0 refills | Status: DC | PRN

## 2016-07-02 MED ORDER — DIPHENHYDRAMINE HCL 50 MG/ML IJ SOLN
25.0000 mg | Freq: Once | INTRAMUSCULAR | Status: AC
  Administered 2016-07-02: 25 mg via INTRAVENOUS

## 2016-07-02 MED ORDER — FAMOTIDINE IN NACL 20-0.9 MG/50ML-% IV SOLN
20.0000 mg | Freq: Once | INTRAVENOUS | Status: AC
  Administered 2016-07-02: 20 mg via INTRAVENOUS

## 2016-07-02 MED ORDER — PALONOSETRON HCL INJECTION 0.25 MG/5ML
INTRAVENOUS | Status: AC
  Filled 2016-07-02: qty 5

## 2016-07-02 MED ORDER — PALONOSETRON HCL INJECTION 0.25 MG/5ML
0.2500 mg | Freq: Once | INTRAVENOUS | Status: AC
  Administered 2016-07-02: 0.25 mg via INTRAVENOUS

## 2016-07-02 MED ORDER — SODIUM CHLORIDE 0.9 % IV SOLN
10.0000 mg | Freq: Once | INTRAVENOUS | Status: AC
  Administered 2016-07-02: 10 mg via INTRAVENOUS
  Filled 2016-07-02: qty 1

## 2016-07-02 MED ORDER — SODIUM CHLORIDE 0.9 % IV SOLN
65.0000 mg/m2 | Freq: Once | INTRAVENOUS | Status: AC
  Administered 2016-07-02: 96 mg via INTRAVENOUS
  Filled 2016-07-02: qty 16

## 2016-07-02 NOTE — Progress Notes (Signed)
Per Ned Card NP pt ok to treat with ANC of 1.3   1618: Pt started to have vomiting as carboplatin finished via patent PIV. Ned Card NP aware and order received for Ativan. Pt medicated per order; refer to [facility]. Pt and family educated on medicine to take for nausea at home and to call clinic if symptoms persist and prescriptions at home do not help. Understanding verbalized and no further questions noted.   1650: Selena Lesser NP at bedside to evaluate pt. Benadryl given per orders; refer to [facility]. Pt started to have itching noted to his back and ears. Pt denies any nausea but c/o headache at this time. Pt has flushing noted to head and chest.   1700: Ned Card, NP at bedside to evaluate pt. Pt continues to have flushing to head and neck; no hives noted.

## 2016-07-02 NOTE — Addendum Note (Signed)
Addended by: Owens Shark on: 07/02/2016 04:24 PM   Modules accepted: Orders

## 2016-07-02 NOTE — Progress Notes (Signed)
[place] OFFICE PROGRESS NOTE   Diagnosis: Esophagus cancer   INTERVAL HISTORY:   Bruce [name] returns as scheduled. He completed cycle 2 week 2 Taxol/carboplatin 06/18/2016. Week 3 was held on 06/25/2016 due to neutropenia. Cough continues to be improved. He has noted a slight cough over the past week. He feels short of breath when he coughs. At times he feels cold in the afternoons. No nausea or vomiting. No numbness or tingling in his hands or feet.  Objective:  Vital signs in last 24 hours:  Blood pressure (!) 97/59, pulse 100, temperature 97.7 F (36.5 C), temperature source Oral, resp. rate 16, height '5\' 3"'$  (1.6 m), weight 107 lb 4.8 oz (48.7 kg), SpO2 97 %.    HEENT: No thrush or ulcers. Resp: Breath sounds diminished at the right lower lung field. No respiratory distress. Cardio: Regular rate and rhythm. GI: Abdomen soft and nontender. No hepatomegaly. Vascular: No leg edema. Skin: No rash.    Lab Results:  Lab Results  Component Value Date   WBC 4.3 07/02/2016   HGB 12.7 (L) 07/02/2016   HCT 39.2 07/02/2016   MCV 84.6 07/02/2016   PLT 203 07/02/2016   NEUTROABS 1.3 (L) 07/02/2016    Imaging:  No results found.  Medications: I have reviewed the patient's current medications.  Assessment/Plan: 1. Squamous cell carcinoma. Staging CT scans of the chest, abdomen and pelvis on 06/02/2012 negative for evidence of metastatic disease. Endoscopic ultrasound 06/11/2012 confirmed a uT3 uN1 tumor at 40 cm from the incisors. He began radiation 06/15/2012. He began weekly Taxol/carboplatin chemotherapy 06/16/2012. He completed 6 weekly chemotherapy treatments. The carboplatin was held with week 5 due to thrombocytopenia. He completed the sixth weekly treatment with Taxol and carboplatin on 07/23/2012. He completed radiation on 07/29/2012 -he underwent an esophagectomy on 08/26/2012 with the pathology confirming a ypT3,ypN0 squamous cell carcinoma with  perineural invasion identified.  2. History of solid/liquid dysphagia secondary to the obstructing esophagus mass.  3. Reflux. He is taking Protonix. 4. Hospitalization 07/19/2014 with abdominal pain and vomiting. CT scan showed a mid small bowel obstruction without discrete mass or evident etiology. Small bowel obstruction felt to be secondary to adhesions. 5. Renal failure during the 07/19/2014 hospitalization felt to be contrast-induced nephropathy. 6. Chest CT 06/15/2013 with postoperative changes from esophagectomy and gastric pull-through. 7 mm posterior subpleural nodule in the right lower lobe. Followup CT scan 12/16/2013 revealed an increase in the size of a right hilar node and right upper lobe nodule.  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  Chest CT 12/23/2014 with enlarged right hilar lymph node measuring 1.2 cm, previously 0.8 cm. Slight enlargement of a periaortic lymph node, 8 mm as compared to 7 mm on the previous study. Increased thickening of the right pleural rind. Moderate sized right pleural effusion.  Chest x-ray 11/28/2015, no change in right pleural effusion  Chest x-ray 04/18/2016 with increase  in the right pleural effusion.  Thoracentesis 04/24/2016-only 25 mL of pleural fluid could be obtained  CT chest  04/24/2016-progressive mediastinal/hilar lymphadenopathy, large right pleural effusion with mixed density, interstitial thickening in the right lower lung concerning for lymphatic tumor spread  [place] chemotherapy with weekly Taxol/carboplatin initiated 04/30/2016  Cycle 2 weekly Taxol/carboplatin 06/11/2016 (chemotherapy dose reduced due to neutropenia); week 3 held on 06/25/2016 due to neutropenia.   Schedule adjusted to every other week beginning 07/02/2016  7. Headaches. Negative brain CT 12/23/2014.   Disposition: Bruce [name] appears stable. He has completed 6 treatments with weekly Taxol/carboplatin. Treatment was held last week due to neutropenia. He continues to have mild neutropenia on labs today. We decided to proceed with treatment today and adjust the treatment schedule to every other week. The plan is to obtain a restaging CT evaluation after he has completed a total of 8 treatments.  He will return for a follow-up visit and treatment 8 Taxol/carboplatin in 2 weeks. He will contact the office in the interim with any problems.  Plan reviewed with Dr. Benay Spice.    Ned Card ANP/GNP-BC   07/02/2016  1:30 PM

## 2016-07-02 NOTE — Patient Instructions (Signed)
[facility] Discharge Instructions for Patients Receiving Chemotherapy  Today you received the following chemotherapy agents Taxol/Carboplatin  To help prevent nausea and vomiting after your treatment, we encourage you to take your nausea medication    If you develop nausea and vomiting that is not controlled by your nausea medication, call the clinic.   BELOW ARE SYMPTOMS THAT SHOULD BE REPORTED IMMEDIATELY:  *FEVER GREATER THAN 100.5 F  *CHILLS WITH OR WITHOUT FEVER  NAUSEA AND VOMITING THAT IS NOT CONTROLLED WITH YOUR NAUSEA MEDICATION  *UNUSUAL SHORTNESS OF BREATH  *UNUSUAL BRUISING OR BLEEDING  TENDERNESS IN MOUTH AND THROAT WITH OR WITHOUT PRESENCE OF ULCERS  *URINARY PROBLEMS  *BOWEL PROBLEMS  UNUSUAL RASH Items with * indicate a potential emergency and should be followed up as soon as possible.  Feel free to call the clinic you have any questions or concerns. The clinic phone number is [phone].  Please show the CHEMO ALERT CARD at check-in to the Emergency Department and triage nurse.   

## 2016-07-03 ENCOUNTER — Encounter: Payer: Self-pay | Admitting: Nurse Practitioner

## 2016-07-03 DIAGNOSIS — T7840XA Allergy, unspecified, initial encounter: Secondary | ICD-10-CM | POA: Insufficient documentation

## 2016-07-03 NOTE — Assessment & Plan Note (Signed)
Patient presented to the [facility] today to receive cycle 7, day 1 of his docetaxel/carboplatin chemotherapy regimen.  He was found to be mildly neutropenic; and decision was made to switch his chemotherapy to an every other week basis.  See previous note from today's visit.  For further details.  Patient is scheduled to return on 07/16/2016 for labs and a follow-up visit.  Note: Patient's daughter translated all today.

## 2016-07-03 NOTE — Progress Notes (Signed)
SYMPTOM MANAGEMENT CLINIC    Chief Complaint: Hypersensitivity reaction  HPI:  Bruce [name] 55 y.o. male diagnosed with esophageal cancer.  Currently undergoing Taxol/carboplatin chemotherapy therapy regimen.  Patient developed a hypersensitivity reaction during his chemotherapy today which consisted of nausea, vomiting, headache, and flushing of his face.    No history exists.    Review of Systems  Constitutional: Positive for malaise/fatigue.  Gastrointestinal: Positive for nausea and vomiting.  Neurological: Positive for headaches.  All other systems reviewed and are negative.   Past Medical History:  Diagnosis Date  . Cancer ([place]) 05/28/12   bx=esophagus=squamous cell carcinoma  . Dysphasia    solid and liquid  . Esophagitis   . Lung cancer ([place]) 12/27/13   right upper lung  . Mass of esophagus 05/28/2012   BX'D DISTAL ESOPHAGUS,PENDING  . Neuropathy ([place])    right hand numbness 1 year 2012    Past Surgical History:  Procedure Laterality Date  . COMPLETE ESOPHAGECTOMY  08/26/2012   Procedure: ESOPHAGECTOMY COMPLETE;  Surgeon: [name], MD;  Location: [facility] OR;  Service: Thoracic;  Laterality: N/A;  transhiatal   . 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 EUS  06/11/2012   Procedure: UPPER ENDOSCOPIC ULTRASOUND (EUS) RADIAL;  Surgeon: Milus Banister, MD;  Location: [facility];  Service: Endoscopy;  Laterality: N/A;  . JEJUNOSTOMY  08/26/2012   Procedure: JEJUNOSTOMY;  Surgeon: [name], MD;  Location: [place];  Service: Thoracic;  Laterality: N/A;  placement of feeding jujunostomy tube  . LYMPH NODE DISSECTION Right 01/31/2014   Procedure: LYMPH NODE DISSECTION;  Surgeon: [name], MD;  Location: [place];  Service: Thoracic;  Laterality: Right;  . PYLOROPLASTY  08/26/2012   Procedure: PYLOROPLASTY;  Surgeon: [name], MD;  Location: [place];  Service:  Thoracic;  Laterality: N/A;  . VIDEO ASSISTED THORACOSCOPY (VATS)/WEDGE RESECTION Right 01/31/2014   Procedure: VIDEO ASSISTED THORACOSCOPY (VATS)/WEDGE RESECTION; with insertion of On Q pain pump;  Surgeon: [name], MD;  Location: [place];  Service: Thoracic;  Laterality: Right;  with insertion of On Q pain pump  . VIDEO BRONCHOSCOPY  08/26/2012   Procedure: VIDEO BRONCHOSCOPY;  Surgeon: [name], MD;  Location: [place];  Service: Thoracic;  Laterality: N/A;  . 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    has No Known Allergies.    Medication List       Accurate as of 07/02/16 11:59 PM. Always use your most recent med list.          albuterol 108 (90 Base) MCG/ACT inhaler Commonly known as:  PROVENTIL HFA;VENTOLIN HFA Inhale 2 puffs into the lungs every 4 (four) hours as needed for wheezing or shortness of breath.   benzonatate 100 MG capsule Commonly known as:  TESSALON PERLES Take 2 capsules (200 mg total) by mouth 3 (three) times daily as needed for cough.   LORazepam 0.5 MG tablet Commonly known as:  ATIVAN Take 1 tablet (0.5 mg total) by mouth at bedtime as needed for anxiety.   pantoprazole 40 MG tablet Commonly known as:  PROTONIX Take 1  tablet (40 mg total) by mouth daily.   polyethylene glycol packet Commonly known as:  MIRALAX Take 17 g by mouth daily.   prochlorperazine 10 MG tablet Commonly known as:  COMPAZINE Take 1 tablet (10 mg total) by mouth every 6 (six) hours as needed for nausea or vomiting.   temazepam 15 MG capsule Commonly known as:  RESTORIL Take 1 capsule (15 mg total) by mouth at bedtime as needed for sleep.   THERAFLU  COLD & COUGH PO Take by mouth.        PHYSICAL EXAMINATION  Oncology Vitals 07/02/2016 07/02/2016  Height - -  Weight - -  Weight (lbs) - -  BMI (kg/m2) - -  Temp - 97.5  Pulse 105 112  Resp 18 18  Resp (Historical as of 06/18/12) - -  SpO2 - 97  BSA (m2) - -   BP Readings from Last 2 Encounters:  07/02/16 99/74  07/02/16 (!) 97/59    Physical Exam  Constitutional: He is oriented to person, place, and time. Vital signs are normal. He appears unhealthy.  HENT:  Head: Normocephalic and atraumatic.  Eyes: Conjunctivae and EOM are normal. Pupils are equal, round, and reactive to light. Right eye exhibits no discharge. Left eye exhibits no discharge. No scleral icterus.  Neck: Normal range of motion. Neck supple.  Pulmonary/Chest: Effort normal. No stridor. No respiratory distress.  Musculoskeletal: Normal range of motion.  Neurological: He is alert and oriented to person, place, and time.  Skin: Skin is warm and dry. No rash noted. No erythema. No pallor.  Patient had some moderate facial flushing on initial exam; but this completely resolved prior to patient's discharge today.  Psychiatric: Affect normal.  Nursing note and vitals reviewed.   LABORATORY DATA:. Appointment on 07/02/2016  Component Date Value Ref Range Status  . WBC 07/02/2016 4.3  4.0 - 10.3 10e3/uL Final  . NEUT# 07/02/2016 1.3* 1.5 - 6.5 10e3/uL Final  . HGB 07/02/2016 12.7* 13.0 - 17.1 g/dL Final  . HCT 07/02/2016 39.2  38.4 - 49.9 % Final  . Platelets 07/02/2016 203  140 - 400 10e3/uL Final  . MCV 07/02/2016 84.6  79.3 - 98.0 fL Final  . MCH 07/02/2016 27.3  27.2 - 33.4 pg Final  . MCHC 07/02/2016 32.3  32.0 - 36.0 g/dL Final  . RBC 07/02/2016 4.64  4.20 - 5.82 10e6/uL Final  . RDW 07/02/2016 18.2* 11.0 - 14.6 % Final  . lymph# 07/02/2016 2.3  0.9 - 3.3 10e3/uL Final  . MONO# 07/02/2016 0.7  0.1 - 0.9 10e3/uL Final  . Eosinophils Absolute 07/02/2016 0.0  0.0 - 0.5 10e3/uL Final  . Basophils Absolute  07/02/2016 0.0  0.0 - 0.1 10e3/uL Final  . NEUT% 07/02/2016 29.2* 39.0 - 75.0 % Final  . LYMPH% 07/02/2016 52.5* 14.0 - 49.0 % Final  . MONO% 07/02/2016 16.9* 0.0 - 14.0 % Final  . EOS% 07/02/2016 0.6  0.0 - 7.0 % Final  . BASO% 07/02/2016 0.8  0.0 - 2.0 % Final  . Sodium 07/02/2016 135* 136 - 145 mEq/L Final  . Potassium 07/02/2016 3.7  3.5 - 5.1 mEq/L Final  . Chloride 07/02/2016 104  98 - 109 mEq/L Final  . CO2 07/02/2016 24  22 - 29 mEq/L Final  . Glucose 07/02/2016 176* 70 - 140 mg/dl Final  . BUN 07/02/2016 8.2  7.0 - 26.0 mg/dL Final  . Creatinine 07/02/2016 0.9  0.7 - 1.3 mg/dL Final  . Total Bilirubin 07/02/2016 0.59  0.20 - 1.20 mg/dL Final  . Alkaline Phosphatase 07/02/2016 83  40 - 150 U/L Final  . AST 07/02/2016 37* 5 - 34 U/L Final  . ALT 07/02/2016 29  0 - 55 U/L Final  . Total Protein 07/02/2016 7.5  6.4 - 8.3 g/dL Final  . Albumin 07/02/2016 3.1* 3.5 - 5.0 g/dL Final  . Calcium 07/02/2016 8.9  8.4 - 10.4 mg/dL Final  . Anion Gap 07/02/2016 6  3 - 11 mEq/L Final  . EGFR 07/02/2016 >90  >90 ml/min/1.73 m2 Final  . Technologist Review 07/02/2016 Variant lymphs present, occ meta and myelocyte   Final    RADIOGRAPHIC STUDIES: No results found.  ASSESSMENT/PLAN:    Malignant neoplasm of lower third of esophagus [facility]) Patient presented to the [facility] today to receive cycle 7, day 1 of his docetaxel/carboplatin chemotherapy regimen.  He was found to be mildly neutropenic; and decision was made to switch his chemotherapy to an every other week basis.  See previous note from today's visit.  For further details.  Patient is scheduled to return on 07/16/2016 for labs and a follow-up visit.  Note: Patient's daughter translated all today.  Hypersensitivity reaction Patient had just completed the carboplatin portion of his chemotherapy; and developed some facial flushing, nausea, one episode of vomiting, and headache.  Patient was given Benadryl 25 mg IV per protocol.   Patient was also given Ativan 0.5 mg IV for his nausea.  Also, confirmed the patient entered he had Benadryl 25 mg, Pepcid, and Decadron as premedications.  Patient was given approximate 400 ML's normal saline IV fluid rehydration as well; and patient had returned to baseline with no flushing, no headache, and no further nausea prior to discharge.  Patient was advised to take Benadryl and Pepcid as directed for any mild allergic reaction symptoms.  He was advised to go directly to the emergency department for any worsening symptoms whatsoever.  Also, printed Educational materials were given to the patient as well.  In regards to both Benadryl and Pepcid usage.  The entire visit was in the presence of patient's daughter who acted as Optometrist.  Also, this provider reviewed all with Dr. Benay Spice as well.  The plan is to remove the carboplatin from patient's future chemotherapy regimen.     Patient stated understanding of all instructions; and was in agreement with this plan of care. The patient knows to call the clinic with any problems, questions or concerns.   Total time spent with patient was 25 minutes;  with greater than 75 percent of that time spent in face to face counseling regarding patient's symptoms,  and coordination of care and follow up.  Disclaimer:This dictation was prepared with Dragon/digital dictation along with [employer]. Any transcriptional errors that result from this process are unintentional.  Drue Second, NP 07/03/2016

## 2016-07-03 NOTE — Assessment & Plan Note (Signed)
Patient had just completed the carboplatin portion of his chemotherapy; and developed some facial flushing, nausea, one episode of vomiting, and headache.  Patient was given Benadryl 25 mg IV per protocol.  Patient was also given Ativan 0.5 mg IV for his nausea.  Also, confirmed the patient entered he had Benadryl 25 mg, Pepcid, and Decadron as premedications.  Patient was given approximate 400 ML's normal saline IV fluid rehydration as well; and patient had returned to baseline with no flushing, no headache, and no further nausea prior to discharge.  Patient was advised to take Benadryl and Pepcid as directed for any mild allergic reaction symptoms.  He was advised to go directly to the emergency department for any worsening symptoms whatsoever.  Also, printed Educational materials were given to the patient as well.  In regards to both Benadryl and Pepcid usage.  The entire visit was in the presence of patient's daughter who acted as Optometrist.  Also, this provider reviewed all with Dr. Benay Spice as well.  The plan is to remove the carboplatin from patient's future chemotherapy regimen.

## 2016-07-05 ENCOUNTER — Telehealth: Payer: Self-pay | Admitting: Oncology

## 2016-07-05 NOTE — Telephone Encounter (Signed)
Appointment conf with dtr Fernande Boyden. 07/05/16

## 2016-07-16 ENCOUNTER — Ambulatory Visit (HOSPITAL_BASED_OUTPATIENT_CLINIC_OR_DEPARTMENT_OTHER): Payer: BLUE CROSS/BLUE SHIELD

## 2016-07-16 ENCOUNTER — Other Ambulatory Visit: Payer: Self-pay

## 2016-07-16 ENCOUNTER — Telehealth: Payer: Self-pay | Admitting: Oncology

## 2016-07-16 ENCOUNTER — Ambulatory Visit (HOSPITAL_BASED_OUTPATIENT_CLINIC_OR_DEPARTMENT_OTHER): Payer: BLUE CROSS/BLUE SHIELD | Admitting: Nurse Practitioner

## 2016-07-16 ENCOUNTER — Ambulatory Visit: Payer: Medicare Other | Admitting: Oncology

## 2016-07-16 ENCOUNTER — Other Ambulatory Visit (HOSPITAL_BASED_OUTPATIENT_CLINIC_OR_DEPARTMENT_OTHER): Payer: BLUE CROSS/BLUE SHIELD

## 2016-07-16 VITALS — BP 101/65 | HR 107 | Temp 98.3°F | Resp 18 | Ht 63.0 in | Wt 106.4 lb

## 2016-07-16 VITALS — BP 97/70 | HR 109 | Temp 97.7°F | Resp 18

## 2016-07-16 DIAGNOSIS — C155 Malignant neoplasm of lower third of esophagus: Secondary | ICD-10-CM

## 2016-07-16 DIAGNOSIS — C7801 Secondary malignant neoplasm of right lung: Secondary | ICD-10-CM | POA: Diagnosis not present

## 2016-07-16 DIAGNOSIS — Z5111 Encounter for antineoplastic chemotherapy: Secondary | ICD-10-CM

## 2016-07-16 DIAGNOSIS — R05 Cough: Secondary | ICD-10-CM

## 2016-07-16 DIAGNOSIS — C7800 Secondary malignant neoplasm of unspecified lung: Secondary | ICD-10-CM

## 2016-07-16 LAB — CBC WITH DIFFERENTIAL/PLATELET
BASO%: 0.9 % (ref 0.0–2.0)
BASOS ABS: 0 10*3/uL (ref 0.0–0.1)
EOS ABS: 0.1 10*3/uL (ref 0.0–0.5)
EOS%: 1.3 % (ref 0.0–7.0)
HCT: 37.9 % — ABNORMAL LOW (ref 38.4–49.9)
HEMOGLOBIN: 12.7 g/dL — AB (ref 13.0–17.1)
LYMPH%: 45.2 % (ref 14.0–49.0)
MCH: 28.4 pg (ref 27.2–33.4)
MCHC: 33.5 g/dL (ref 32.0–36.0)
MCV: 84.8 fL (ref 79.3–98.0)
MONO#: 0.9 10*3/uL (ref 0.1–0.9)
MONO%: 19.6 % — AB (ref 0.0–14.0)
NEUT%: 33 % — ABNORMAL LOW (ref 39.0–75.0)
NEUTROS ABS: 1.6 10*3/uL (ref 1.5–6.5)
PLATELETS: 127 10*3/uL — AB (ref 140–400)
RBC: 4.47 10*6/uL (ref 4.20–5.82)
RDW: 17.6 % — ABNORMAL HIGH (ref 11.0–14.6)
WBC: 4.7 10*3/uL (ref 4.0–10.3)
lymph#: 2.1 10*3/uL (ref 0.9–3.3)

## 2016-07-16 LAB — COMPREHENSIVE METABOLIC PANEL
ALBUMIN: 3.1 g/dL — AB (ref 3.5–5.0)
ALK PHOS: 87 U/L (ref 40–150)
ALT: 24 U/L (ref 0–55)
ANION GAP: 8 meq/L (ref 3–11)
AST: 30 U/L (ref 5–34)
BILIRUBIN TOTAL: 0.57 mg/dL (ref 0.20–1.20)
BUN: 8 mg/dL (ref 7.0–26.0)
CO2: 23 mEq/L (ref 22–29)
Calcium: 9.2 mg/dL (ref 8.4–10.4)
Chloride: 106 mEq/L (ref 98–109)
Creatinine: 0.8 mg/dL (ref 0.7–1.3)
GLUCOSE: 110 mg/dL (ref 70–140)
POTASSIUM: 3.9 meq/L (ref 3.5–5.1)
SODIUM: 137 meq/L (ref 136–145)
TOTAL PROTEIN: 8 g/dL (ref 6.4–8.3)

## 2016-07-16 MED ORDER — SODIUM CHLORIDE 0.9 % IV SOLN
Freq: Once | INTRAVENOUS | Status: AC
  Administered 2016-07-16: 13:00:00 via INTRAVENOUS

## 2016-07-16 MED ORDER — SODIUM CHLORIDE 0.9 % IV SOLN
65.0000 mg/m2 | Freq: Once | INTRAVENOUS | Status: AC
  Administered 2016-07-16: 96 mg via INTRAVENOUS
  Filled 2016-07-16: qty 16

## 2016-07-16 MED ORDER — FAMOTIDINE IN NACL 20-0.9 MG/50ML-% IV SOLN
INTRAVENOUS | Status: AC
  Filled 2016-07-16: qty 50

## 2016-07-16 MED ORDER — PALONOSETRON HCL INJECTION 0.25 MG/5ML
INTRAVENOUS | Status: AC
  Filled 2016-07-16: qty 5

## 2016-07-16 MED ORDER — SODIUM CHLORIDE 0.9 % IV SOLN
10.0000 mg | Freq: Once | INTRAVENOUS | Status: AC
  Administered 2016-07-16: 10 mg via INTRAVENOUS
  Filled 2016-07-16: qty 1

## 2016-07-16 MED ORDER — PALONOSETRON HCL INJECTION 0.25 MG/5ML
0.2500 mg | Freq: Once | INTRAVENOUS | Status: AC
  Administered 2016-07-16: 0.25 mg via INTRAVENOUS

## 2016-07-16 MED ORDER — BENZONATATE 100 MG PO CAPS: 200.0000 mg | ORAL_CAPSULE | Freq: Three times a day (TID) | ORAL | 0 refills | Status: DC | PRN

## 2016-07-16 MED ORDER — FAMOTIDINE IN NACL 20-0.9 MG/50ML-% IV SOLN
20.0000 mg | Freq: Once | INTRAVENOUS | Status: AC
  Administered 2016-07-16: 20 mg via INTRAVENOUS

## 2016-07-16 MED ORDER — DIPHENHYDRAMINE HCL 50 MG/ML IJ SOLN
25.0000 mg | Freq: Once | INTRAMUSCULAR | Status: AC
  Administered 2016-07-16: 25 mg via INTRAVENOUS

## 2016-07-16 MED ORDER — DIPHENHYDRAMINE HCL 50 MG/ML IJ SOLN
INTRAMUSCULAR | Status: AC
  Filled 2016-07-16: qty 1

## 2016-07-16 NOTE — Progress Notes (Signed)
[place] OFFICE PROGRESS NOTE   Diagnosis:  Esophagus cancer  INTERVAL HISTORY:   Bruce [name] returns as scheduled. He completed the seventh treatment with weekly Taxol/carboplatin 07/02/2016. Toward the end of the carboplatin infusion he developed nausea/vomiting, flushing, headache. The decision was made to eliminate the carboplatin from future treatments.  He had mild nausea at home the day he received treatment. No mouth sores. No further headaches. He has noted a mild increase in his cough over the past 2 weeks. The cough continues to be significantly improved as compared to prechemotherapy. He denies shortness of breath. He has mild numbness in the fingertips. This does not interfere with activity.  Objective:  Vital signs in last 24 hours:  Blood pressure 101/65, pulse (!) 107, temperature 98.3 F (36.8 C), temperature source Oral, resp. rate 18, height '5\' 3"'$  (1.6 m), weight 106 lb 6.4 oz (48.3 kg), SpO2 100 %.    HEENT: No thrush or ulcers. Lymphatics: No palpable cervical or supra-clavicular lymph nodes. Resp: Breath sounds diminished right lower lung field. No respiratory distress. Cardio: Regular rate and rhythm. GI: Abdomen soft and nontender. No hepatomegaly. Vascular: No leg edema. Skin: No rash.    Lab Results:  Lab Results  Component Value Date   WBC 4.7 07/16/2016   HGB 12.7 (L) 07/16/2016   HCT 37.9 (L) 07/16/2016   MCV 84.8 07/16/2016   PLT 127 (L) 07/16/2016   NEUTROABS 1.6 07/16/2016    Imaging:  No results found.  Medications: I have reviewed the patient's current medications.  Assessment/Plan: 1. Squamous cell carcinoma. Staging CT scans of the chest, abdomen and pelvis on 06/02/2012 negative for evidence of metastatic disease. Endoscopic ultrasound 06/11/2012 confirmed a uT3 uN1 tumor at 40 cm from the incisors. He began radiation 06/15/2012. He began weekly Taxol/carboplatin chemotherapy 06/16/2012. He completed 6 weekly  chemotherapy treatments. The carboplatin was held with week 5 due to thrombocytopenia. He completed the sixth weekly treatment with Taxol and carboplatin on 07/23/2012. He completed radiation on 07/29/2012 -he underwent an esophagectomy on 08/26/2012 with the pathology confirming a ypT3,ypN0 squamous cell carcinoma with perineural invasion identified.  2. History of solid/liquid dysphagia secondary to the obstructing esophagus mass.  3. Reflux. He is taking Protonix. 4. Hospitalization 07/19/2014 with abdominal pain and vomiting. CT scan showed a mid small bowel obstruction without discrete mass or evident etiology. Small bowel obstruction felt to be secondary to adhesions. 5. Renal failure during the 07/19/2014 hospitalization felt to be contrast-induced nephropathy. 6. Chest CT 06/15/2013 with postoperative changes from esophagectomy and gastric pull-through. 7 mm posterior subpleural nodule in the right lower lobe. Followup CT scan 12/16/2013 revealed an increase in the size of a right hilar node and right upper lobe nodule.  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  Chest CT 12/23/2014 with enlarged right hilar lymph node measuring 1.2 cm, previously 0.8 cm. Slight enlargement of a periaortic lymph node,  8 mm as compared to 7 mm on the previous study.  Increased thickening of the right pleural rind. Moderate sized right pleural effusion.  Chest x-ray 11/28/2015, no change in right pleural effusion  Chest x-ray 04/18/2016 with increase in the right pleural effusion.  Thoracentesis 04/24/2016-only 25 mL of pleural fluid could be obtained  CT chest 04/24/2016-progressive mediastinal/hilar lymphadenopathy, large right pleural effusion with mixed density, interstitial thickening in the right lower lung concerning for lymphatic tumor spread  [place] chemotherapy with weekly Taxol/carboplatin initiated 04/30/2016  Cycle 2 weekly Taxol/carboplatin 06/11/2016(chemotherapy dose reduced due to neutropenia); week 3 held on 06/25/2016 due to neutropenia.   Schedule adjusted to every other week beginning 07/02/2016    07/02/2016 he developed nausea/vomiting, headache and facial flushing during the carboplatin infusion    Carboplatin eliminated from the regimen beginning 07/16/2016           7. Headaches. Negative brain CT 12/23/2014.   Disposition: Bruce [name] appears stable. He has completed 7 treatments with Taxol/carboplatin. Toward the end of the carboplatin infusion with treatment 7 he had a reaction with nausea/vomiting, flushing, headache. Carboplatin has been eliminated from the regimen. Plan to proceed with Taxol alone today as scheduled.  He will return for a follow-up visit in 2 weeks. He will have a restaging CT scan of the chest a few days prior to that visit. He will contact the office in the interim with any problems.    Ned Card ANP/GNP-BC   07/16/2016  11:54 AM

## 2016-07-16 NOTE — Patient Instructions (Signed)
[facility] Discharge Instructions for Patients Receiving Chemotherapy  Today you received the following chemotherapy agents Taxol  To help prevent nausea and vomiting after your treatment, we encourage you to take your nausea medication   If you develop nausea and vomiting that is not controlled by your nausea medication, call the clinic.   BELOW ARE SYMPTOMS THAT SHOULD BE REPORTED IMMEDIATELY:  *FEVER GREATER THAN 100.5 F  *CHILLS WITH OR WITHOUT FEVER  NAUSEA AND VOMITING THAT IS NOT CONTROLLED WITH YOUR NAUSEA MEDICATION  *UNUSUAL SHORTNESS OF BREATH  *UNUSUAL BRUISING OR BLEEDING  TENDERNESS IN MOUTH AND THROAT WITH OR WITHOUT PRESENCE OF ULCERS  *URINARY PROBLEMS  *BOWEL PROBLEMS  UNUSUAL RASH Items with * indicate a potential emergency and should be followed up as soon as possible.  Feel free to call the clinic you have any questions or concerns. The clinic phone number is [phone].  Please show the CHEMO ALERT CARD at check-in to the Emergency Department and triage nurse.   

## 2016-07-16 NOTE — Telephone Encounter (Signed)
GAVE PATIENT DTR AVS REPORT AND APPOINTMENTS FOR September. [facility] WILL CALL RE SCAN - DTR AWARE.

## 2016-07-25 ENCOUNTER — Other Ambulatory Visit: Payer: Self-pay | Admitting: *Deleted

## 2016-07-26 ENCOUNTER — Telehealth: Payer: Self-pay | Admitting: *Deleted

## 2016-07-26 NOTE — Telephone Encounter (Signed)
Call received from patient's daughter stating that CT Scan has not been scheduled at this time.  Gaspar Bidding notified, PA complete and central scheduling notified to call pt.'s daughter to set up appt for CT.

## 2016-07-28 ENCOUNTER — Other Ambulatory Visit: Payer: Self-pay | Admitting: Oncology

## 2016-07-29 ENCOUNTER — Ambulatory Visit (HOSPITAL_COMMUNITY)
Admission: RE | Admit: 2016-07-29 | Discharge: 2016-07-29 | Disposition: A | Discharge status: Home / Self Care | Payer: BLUE CROSS/BLUE SHIELD | Source: Ambulatory Visit | Attending: Nurse Practitioner | Admitting: Nurse Practitioner

## 2016-07-29 ENCOUNTER — Encounter (HOSPITAL_COMMUNITY): Payer: Self-pay

## 2016-07-29 DIAGNOSIS — Z9049 Acquired absence of other specified parts of digestive tract: Secondary | ICD-10-CM | POA: Insufficient documentation

## 2016-07-29 DIAGNOSIS — C7800 Secondary malignant neoplasm of unspecified lung: Secondary | ICD-10-CM | POA: Diagnosis not present

## 2016-07-29 DIAGNOSIS — J90 Pleural effusion, not elsewhere classified: Secondary | ICD-10-CM | POA: Insufficient documentation

## 2016-07-29 DIAGNOSIS — C155 Malignant neoplasm of lower third of esophagus: Secondary | ICD-10-CM | POA: Insufficient documentation

## 2016-07-29 DIAGNOSIS — R918 Other nonspecific abnormal finding of lung field: Secondary | ICD-10-CM | POA: Insufficient documentation

## 2016-07-29 DIAGNOSIS — R590 Localized enlarged lymph nodes: Secondary | ICD-10-CM | POA: Insufficient documentation

## 2016-07-29 MED ORDER — IOPAMIDOL (ISOVUE-300) INJECTION 61%
75.0000 mL | Freq: Once | INTRAVENOUS | Status: AC | PRN
  Administered 2016-07-29: 75 mL via INTRAVENOUS

## 2016-07-30 ENCOUNTER — Ambulatory Visit: Payer: BLUE CROSS/BLUE SHIELD

## 2016-07-30 ENCOUNTER — Telehealth: Payer: Self-pay | Admitting: Oncology

## 2016-07-30 ENCOUNTER — Other Ambulatory Visit (HOSPITAL_COMMUNITY)
Admission: RE | Admit: 2016-07-30 | Discharge: 2016-07-30 | Disposition: A | Discharge status: Home / Self Care | Payer: BLUE CROSS/BLUE SHIELD | Source: Ambulatory Visit | Attending: Oncology | Admitting: Oncology

## 2016-07-30 ENCOUNTER — Ambulatory Visit (HOSPITAL_BASED_OUTPATIENT_CLINIC_OR_DEPARTMENT_OTHER): Payer: BLUE CROSS/BLUE SHIELD | Admitting: Oncology

## 2016-07-30 ENCOUNTER — Other Ambulatory Visit (HOSPITAL_BASED_OUTPATIENT_CLINIC_OR_DEPARTMENT_OTHER): Payer: BLUE CROSS/BLUE SHIELD

## 2016-07-30 DIAGNOSIS — C7800 Secondary malignant neoplasm of unspecified lung: Secondary | ICD-10-CM

## 2016-07-30 DIAGNOSIS — C155 Malignant neoplasm of lower third of esophagus: Secondary | ICD-10-CM | POA: Diagnosis not present

## 2016-07-30 DIAGNOSIS — C7801 Secondary malignant neoplasm of right lung: Secondary | ICD-10-CM

## 2016-07-30 LAB — COMPREHENSIVE METABOLIC PANEL
ALT: 24 U/L (ref 0–55)
AST: 26 U/L (ref 5–34)
Albumin: 2.9 g/dL — ABNORMAL LOW (ref 3.5–5.0)
Alkaline Phosphatase: 85 U/L (ref 40–150)
Anion Gap: 9 mEq/L (ref 3–11)
BUN: 8.5 mg/dL (ref 7.0–26.0)
CHLORIDE: 105 meq/L (ref 98–109)
CO2: 20 meq/L — AB (ref 22–29)
CREATININE: 0.9 mg/dL (ref 0.7–1.3)
Calcium: 8.8 mg/dL (ref 8.4–10.4)
EGFR: >90 mL/min/{1.73_m2} (ref >90)
GLUCOSE: 143 mg/dL — AB (ref 70–140)
POTASSIUM: 4 meq/L (ref 3.5–5.1)
SODIUM: 133 meq/L — AB (ref 136–145)
Total Bilirubin: 0.49 mg/dL (ref 0.20–1.20)
Total Protein: 8 g/dL (ref 6.4–8.3)

## 2016-07-30 LAB — CBC WITH DIFFERENTIAL/PLATELET
BASO%: 0.9 % (ref 0.0–2.0)
Basophils Absolute: 0 10*3/uL (ref 0.0–0.1)
EOS%: 2.1 % (ref 0.0–7.0)
Eosinophils Absolute: 0.1 10*3/uL (ref 0.0–0.5)
HEMATOCRIT: 36 % — AB (ref 38.4–49.9)
HGB: 11.7 g/dL — ABNORMAL LOW (ref 13.0–17.1)
LYMPH#: 2.1 10*3/uL (ref 0.9–3.3)
LYMPH%: 48.7 % (ref 14.0–49.0)
MCH: 28 pg (ref 27.2–33.4)
MCHC: 32.5 g/dL (ref 32.0–36.0)
MCV: 86.1 fL (ref 79.3–98.0)
MONO#: 0.8 10*3/uL (ref 0.1–0.9)
MONO%: 17.8 % — AB (ref 0.0–14.0)
NEUT#: 1.3 10*3/uL — ABNORMAL LOW (ref 1.5–6.5)
NEUT%: 30.5 % — AB (ref 39.0–75.0)
Platelets: 201 10*3/uL (ref 140–400)
RBC: 4.17 10*6/uL — AB (ref 4.20–5.82)
RDW: 19.6 % — ABNORMAL HIGH (ref 11.0–14.6)
WBC: 4.2 10*3/uL (ref 4.0–10.3)

## 2016-07-30 MED ORDER — PROCHLORPERAZINE MALEATE 10 MG PO TABS: 10.0000 mg | ORAL_TABLET | Freq: Four times a day (QID) | ORAL | 1 refills | Status: AC | PRN

## 2016-07-30 MED ORDER — PANTOPRAZOLE SODIUM 40 MG PO TBEC: 40.0000 mg | DELAYED_RELEASE_TABLET | Freq: Every day | ORAL | 5 refills | Status: DC

## 2016-07-30 MED ORDER — BENZONATATE 100 MG PO CAPS: 200.0000 mg | ORAL_CAPSULE | Freq: Three times a day (TID) | ORAL | 0 refills | Status: DC | PRN

## 2016-07-30 MED ORDER — TEMAZEPAM 15 MG PO CAPS: 15.0000 mg | ORAL_CAPSULE | Freq: Every evening | ORAL | 0 refills | Status: DC | PRN

## 2016-07-30 NOTE — Progress Notes (Signed)
Called [facility]: Requested PDL-1 testing on lung resection, per Dr. Benay Spice. Accession # [id number].

## 2016-07-30 NOTE — Telephone Encounter (Signed)
AVS report and appointment schedule given per 07/30/16 los. °

## 2016-07-30 NOTE — Progress Notes (Signed)
[facility] OFFICE PROGRESS NOTE   Diagnosis: Esophagus cancer  INTERVAL HISTORY:   Bruce [name] returns with his daughter. He was last treated with Taxol 07/16/2016. No neuropathy symptoms. No pain. He continues to have an intermittent cough. No dysphagia. His daughter notes he is losing weight.  Objective:  Vital signs in last 24 hours:  Blood pressure 102/63, pulse (!) 104, temperature 98.2 F (36.8 C), temperature source Oral, resp. rate 16, height '5\' 3"'$  (1.6 m), weight 104 lb 8 oz (47.4 kg), SpO2 99 %.    HEENT:  no thrush Lymphatics:  "shotty "bilateral axillary nodes. Resp:  lungs clear bilaterally with decreased breath sounds at the right lower chest Cardio:  regular rate and rhythm GI:  no hepatosplenomegaly, nontender Vascular:  no leg edema   Lab Results:  Lab Results  Component Value Date   WBC 4.2 07/30/2016   HGB 11.7 (L) 07/30/2016   HCT 36.0 (L) 07/30/2016   MCV 86.1 07/30/2016   PLT 201 07/30/2016   NEUTROABS 1.3 (L) 07/30/2016     Imaging:  Ct Chest W Contrast  Result Date: 07/29/2016 CLINICAL DATA:  Metastatic esophageal cancer, ongoing chemotherapy EXAM: CT CHEST WITH CONTRAST TECHNIQUE: Multidetector CT imaging of the chest was performed during intravenous contrast administration. CONTRAST:  18m ISOVUE-300 IOPAMIDOL (ISOVUE-300) INJECTION 61% COMPARISON:  Chest radiographs dated 06/04/2016. CT chest dated 04/24/2016. FINDINGS: Cardiovascular: Heart is normal in size.  No pericardial effusion. Mediastinum/Nodes: Extensive thoracic lymphadenopathy, including: --1.6 cm short axis left supraclavicular node (series 2/image 13), previously 1.4 cm --1.5 cm short axis high left paratracheal node (series 2/ image 38), previously 1.6 cm --2.0 cm short axis prevascular node (series 2/ image 49), previously 2.3 cm --2.5 cm short axis AP window node (series 2/ image 56), previously 2.6 cm --1.4 cm short axis subcarinal node (series 2/ image 67),  previously 2.3 cm --1.5 cm short axis left hilar node (series 2/ image 70), previously 1.3 cm Status post esophagectomy with gastric pull-through. Visualized thyroid is unremarkable. Lungs/Pleura: Evaluation of the lung parenchyma is constrained by respiratory motion. Status post right upper lobe wedge resection with associated volume loss. Increased interstitial opacity in the medial right upper lobe (series 5/image 57), extending to the right perihilar region, similar to the prior and suspicious for recurrent tumor. Additional patchy opacity posteriorly in the right lower lobe measuring up to 3.1 cm (series 5/image 94), favored to reflect rounded atelectasis. Additional subpleural nodular opacity laterally in the right middle lobe (series 5/image 90), also unchanged. Two left lower lobe pulmonary nodules measuring up to 5 mm (series 5/ images 69 and 71), possibly new, metastasis not excluded. Moderate right pleural effusion with associated pleural thickening and internal complexity, possibly malignant. Underlying mild paraseptal emphysematous changes with is suspected subpleural reticulation/ fibrosis, left lower lobe predominant, favoring chronic interstitial lung disease. No pneumothorax. Upper Abdomen: Visualize upper abdomen is unremarkable. Musculoskeletal: Mild degenerative changes of the visualized thoracolumbar spine. IMPRESSION: Status post esophagectomy with gastric pull-through. Status post right upper lobe wedge resection. Extensive thoracic lymphadenopathy, compatible with nodal metastases, mildly improved. Right upper lobe interstitial opacity, favoring pulmonary metastasis, grossly unchanged. Additional subpleural nodule in the right middle lobe and subpleural mass in the right lower lobe favor rounded atelectasis. Moderate right pleural effusion, complex, possibly malignant. Two new left lower lobe nodules measuring up to 5 mm, metastases not excluded. Electronically Signed   By: SJulian Hy M.D.   On: 07/29/2016 16:59  CT images were reviewed with Bruce [name]  and his daughter   Medications: I have reviewed the patient's current medications.  Assessment/Plan: 1. Squamous cell carcinoma. Staging CT scans of the chest, abdomen and pelvis on 06/02/2012 negative for evidence of metastatic disease. Endoscopic ultrasound 06/11/2012 confirmed a uT3 uN1 tumor at 40 cm from the incisors. He began radiation 06/15/2012. He began weekly Taxol/carboplatin chemotherapy 06/16/2012. He completed 6 weekly chemotherapy treatments. The carboplatin was held with week 5 due to thrombocytopenia. He completed the sixth weekly treatment with Taxol and carboplatin on 07/23/2012. He completed radiation on 07/29/2012 -he underwent an esophagectomy on 08/26/2012 with the pathology confirming a ypT3,ypN0 squamous cell carcinoma with perineural invasion identified.  2. History of solid/liquid dysphagia secondary to the obstructing esophagus mass.  3. Reflux. He is taking Protonix. 4. Hospitalization 07/19/2014 with abdominal pain and vomiting. CT scan showed a mid small bowel obstruction without discrete mass or evident etiology. Small bowel obstruction felt to be secondary to adhesions. 5. Renal failure during the 07/19/2014 hospitalization felt to be contrast-induced nephropathy. 6. Chest CT 06/15/2013 with postoperative changes from esophagectomy and gastric pull-through. 7 mm posterior subpleural nodule in the right lower lobe. Followup CT scan 12/16/2013 revealed an increase in the size of a right hilar node and right upper lobe nodule.  CT biopsy of the right upper lobe nodule on 12/27/2013 confirmed invasive squamous cell carcinoma, TTF-1 negative Staging PET scan 01/11/2014 confirmed a hypermetabolic cavitary right upper lobe mass, hypermetabolic right hilar node   Status post wedge resection of a right upper lung mass and lymph node sampling 01/31/2014, the pathology confirmed a 2.1 cm squamous cell  carcinoma in the right upper lobe with lymphovascular invasion with metastatic squamous cell carcinoma in a right paraesophageal node-favored to represent metastatic esophagus cancer   CT 07/04/2014 with a persistent right hilar lymph node, right pleural effusion, stable 6 mm lymph node at the descending thoracic aorta.  Chest CT 12/23/2014 with enlarged right hilar lymph node measuring 1.2 cm, previously 0.8 cm. Slight enlargement of a periaortic lymph node, 8 mm as compared to 7 mm on the previous study. Increased thickening of the right pleural rind. Moderate sized right pleural effusion.  Chest x-ray 11/28/2015, no change in right pleural effusion  Chest x-ray 04/18/2016 with increase in the right pleural effusion.  Thoracentesis 04/24/2016-only 25 mL of pleural fluid could be obtained  CT chest 04/24/2016-progressive mediastinal/hilar lymphadenopathy, large right pleural effusion with mixed density, interstitial thickening in the right lower lung concerning for lymphatic tumor spread  Salvage chemotherapy with weekly Taxol/carboplatin initiated 04/30/2016  Cycle 2 weekly Taxol/carboplatin 06/11/2016(chemotherapy dose reduced due to neutropenia); week 3 held on 06/25/2016 due to neutropenia.   Schedule adjusted to every other week beginning 07/02/2016  07/02/2016 he developed nausea/vomiting, headache and facial flushing during the carboplatin infusion   Carboplatin eliminated from the regimen beginning 07/16/2016  CT chest   07/29/2016-mildly improved mediastinal lymphadenopathy,persistent right pleural effusion, possible new left lower lobe nodules 7. Headaches. Negative brain CT 12/23/2014.     Disposition:  He has been maintained on salvage chemotherapy for the past 3 months. The restaging CT reveals overall stable disease. His performance status appears to be declining. We decided to hold further Taxol chemotherapy. We will submit the right lung resection  specimen for PD1 testing. He will return for an office visit next week to discuss treatment options.  Betsy Coder, MD  07/30/2016  8:53 AM

## 2016-08-05 ENCOUNTER — Encounter (HOSPITAL_COMMUNITY): Payer: Self-pay

## 2016-08-06 ENCOUNTER — Telehealth: Payer: Self-pay | Admitting: Oncology

## 2016-08-06 ENCOUNTER — Ambulatory Visit (HOSPITAL_BASED_OUTPATIENT_CLINIC_OR_DEPARTMENT_OTHER): Payer: BLUE CROSS/BLUE SHIELD | Admitting: Nurse Practitioner

## 2016-08-06 VITALS — BP 101/65 | HR 112 | Temp 97.9°F | Resp 16 | Ht 63.0 in | Wt 104.2 lb

## 2016-08-06 DIAGNOSIS — C155 Malignant neoplasm of lower third of esophagus: Secondary | ICD-10-CM

## 2016-08-06 DIAGNOSIS — R05 Cough: Secondary | ICD-10-CM

## 2016-08-06 DIAGNOSIS — C7801 Secondary malignant neoplasm of right lung: Secondary | ICD-10-CM | POA: Diagnosis not present

## 2016-08-06 DIAGNOSIS — C7800 Secondary malignant neoplasm of unspecified lung: Secondary | ICD-10-CM

## 2016-08-06 NOTE — Telephone Encounter (Signed)
Gave patient dtr avs report and appointments for for September and October

## 2016-08-06 NOTE — Progress Notes (Addendum)
[facility] OFFICE PROGRESS NOTE   Diagnosis:  Esophagus cancer  INTERVAL HISTORY:   Bruce [name] returns as scheduled. He notes an increased cough. Periodic shortness of breath. No dysphagia. Appetite varies.  Objective:  Vital signs in last 24 hours:  Blood pressure 101/65, pulse (!) 112, temperature 97.9 F (36.6 C), temperature source Oral, resp. rate 16, height 5' 3"  (1.6 m), weight 104 lb 3.2 oz (47.3 kg), SpO2 98 %.    HEENT: No thrush or ulcers. Resp: Lungs are clear bilaterally. Breath sounds are diminished at the right lower chest. Cardio: Regular rate and rhythm. GI: Abdomen soft and nontender. No organomegaly. Vascular: No leg edema.     Lab Results:  Lab Results  Component Value Date   WBC 4.2 07/30/2016   HGB 11.7 (L) 07/30/2016   HCT 36.0 (L) 07/30/2016   MCV 86.1 07/30/2016   PLT 201 07/30/2016   NEUTROABS 1.3 (L) 07/30/2016    Imaging:  No results found.  Medications: I have reviewed the patient's current medications.  Assessment/Plan: 1. Squamous cell carcinoma. Staging CT scans of the chest, abdomen and pelvis on 06/02/2012 negative for evidence of metastatic disease. Endoscopic ultrasound 06/11/2012 confirmed a uT3 uN1 tumor at 40 cm from the incisors. He began radiation 06/15/2012. He began weekly Taxol/carboplatin chemotherapy 06/16/2012. He completed 6 weekly chemotherapy treatments. The carboplatin was held with week 5 due to thrombocytopenia. He completed the sixth weekly treatment with Taxol and carboplatin on 07/23/2012. He completed radiation on 07/29/2012 -he underwent an esophagectomy on 08/26/2012 with the pathology confirming a ypT3,ypN0 squamous cell carcinoma with perineural invasion identified.  2. History of solid/liquid dysphagia secondary to the obstructing esophagus mass.  3. Reflux. He is taking Protonix. 4. Hospitalization 07/19/2014 with abdominal pain and vomiting. CT scan showed a mid small bowel obstruction  without discrete mass or evident etiology. Small bowel obstruction felt to be secondary to adhesions. 5. Renal failure during the 07/19/2014 hospitalization felt to be contrast-induced nephropathy. 6. Chest CT 06/15/2013 with postoperative changes from esophagectomy and gastric pull-through. 7 mm posterior subpleural nodule in the right lower lobe. Followup CT scan 12/16/2013 revealed an increase in the size of a right hilar node and right upper lobe nodule.  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  Chest CT 12/23/2014 with enlarged right hilar lymph node measuring 1.2 cm, previously 0.8 cm. Slight enlargement of a periaortic lymph node, 8 mm as compared to 7 mm on the previous study. Increased thickening of the right pleural rind. Moderate sized right pleural effusion.  Chest x-ray 11/28/2015, no change in right pleural effusion  Chest x-ray 04/18/2016 with increase in the right pleural effusion.  Thoracentesis 04/24/2016-only 25 mL of pleural fluid could be obtained  CT chest 04/24/2016-progressive mediastinal/hilar lymphadenopathy, large right pleural effusion with mixed density, interstitial thickening in the right lower lung concerning for lymphatic tumor spread  Salvage chemotherapy with weekly Taxol/carboplatin initiated  04/30/2016  Cycle 2  weekly Taxol/carboplatin 06/11/2016(chemotherapy dose reduced due to neutropenia); week 3 held on 06/25/2016 due to neutropenia.   Schedule adjusted to every other week beginning 07/02/2016  07/02/2016 he developed nausea/vomiting, headache and facial flushing during the carboplatin infusion   Carboplatin eliminated from the regimen beginning 07/16/2016  CT chest   07/29/2016-mildly improved mediastinal lymphadenopathy,persistent right pleural effusion, possible new left lower lobe nodules  PD-L1 10% 7. Headaches. Negative brain CT 12/23/2014.   Disposition: Bruce [name] appears unchanged. PD-L1 testing showed 10% expression. Dr. Benay Spice recommends treatment with Pembrolizumab every 3 weeks. We reviewed potential toxicities including diarrhea, rash, pneumonitis, thyroid dysfunction with Bruce [name] and his daughter. He is agreeable to proceed. He will return in one week for the first cycle. We will see him in follow-up in 4 weeks prior to cycle 2. He will contact the office in the interim with any problems.  Patient seen with Dr. Benay Spice.    Ned Card ANP/GNP-BC   08/06/2016  3:02 PM This was a shared visit with Ned Card. He discussed the PD L1 testing and treatment options with Bruce [name] and his daughter. He appears to be a candidate for PD1 inhibitor therapy. We discussed the expected response rate and potential toxicities with him. He agrees to proceed. Hopefully his cough will improve with treatment. Julieanne Manson, M.D.

## 2016-08-11 ENCOUNTER — Other Ambulatory Visit: Payer: Self-pay | Admitting: Oncology

## 2016-08-13 ENCOUNTER — Other Ambulatory Visit (HOSPITAL_COMMUNITY)
Admission: RE | Admit: 2016-08-13 | Discharge: 2016-08-13 | Disposition: A | Discharge status: Home / Self Care | Payer: BLUE CROSS/BLUE SHIELD | Source: Ambulatory Visit | Attending: Oncology | Admitting: Oncology

## 2016-08-13 ENCOUNTER — Telehealth: Payer: Self-pay | Admitting: Oncology

## 2016-08-13 ENCOUNTER — Ambulatory Visit: Payer: BLUE CROSS/BLUE SHIELD

## 2016-08-13 ENCOUNTER — Other Ambulatory Visit (HOSPITAL_BASED_OUTPATIENT_CLINIC_OR_DEPARTMENT_OTHER): Payer: BLUE CROSS/BLUE SHIELD

## 2016-08-13 DIAGNOSIS — C155 Malignant neoplasm of lower third of esophagus: Secondary | ICD-10-CM

## 2016-08-13 DIAGNOSIS — C7800 Secondary malignant neoplasm of unspecified lung: Secondary | ICD-10-CM

## 2016-08-13 DIAGNOSIS — C7801 Secondary malignant neoplasm of right lung: Secondary | ICD-10-CM | POA: Diagnosis not present

## 2016-08-13 DIAGNOSIS — Z79899 Other long term (current) drug therapy: Secondary | ICD-10-CM

## 2016-08-13 LAB — COMPREHENSIVE METABOLIC PANEL
ALBUMIN: 3 g/dL — AB (ref 3.5–5.0)
ALK PHOS: 89 U/L (ref 40–150)
ALT: 26 U/L (ref 0–55)
AST: 30 U/L (ref 5–34)
Anion Gap: 7 mEq/L (ref 3–11)
BILIRUBIN TOTAL: 0.52 mg/dL (ref 0.20–1.20)
BUN: 7.6 mg/dL (ref 7.0–26.0)
CO2: 22 meq/L (ref 22–29)
CREATININE: 0.8 mg/dL (ref 0.7–1.3)
Calcium: 9 mg/dL (ref 8.4–10.4)
Chloride: 106 mEq/L (ref 98–109)
GLUCOSE: 109 mg/dL (ref 70–140)
Potassium: 3.6 mEq/L (ref 3.5–5.1)
SODIUM: 134 meq/L — AB (ref 136–145)
TOTAL PROTEIN: 8.8 g/dL — AB (ref 6.4–8.3)

## 2016-08-13 LAB — CBC WITH DIFFERENTIAL/PLATELET
BASO%: 0.6 % (ref 0.0–2.0)
Basophils Absolute: 0 10*3/uL (ref 0.0–0.1)
EOS ABS: 0.1 10*3/uL (ref 0.0–0.5)
EOS%: 2 % (ref 0.0–7.0)
HCT: 35.1 % — ABNORMAL LOW (ref 38.4–49.9)
HEMOGLOBIN: 11.5 g/dL — AB (ref 13.0–17.1)
LYMPH%: 36.3 % (ref 14.0–49.0)
MCH: 28.5 pg (ref 27.2–33.4)
MCHC: 32.6 g/dL (ref 32.0–36.0)
MCV: 87.4 fL (ref 79.3–98.0)
MONO#: 0.8 10*3/uL (ref 0.1–0.9)
MONO%: 14.1 % — AB (ref 0.0–14.0)
NEUT%: 47 % (ref 39.0–75.0)
NEUTROS ABS: 2.8 10*3/uL (ref 1.5–6.5)
PLATELETS: 219 10*3/uL (ref 140–400)
RBC: 4.02 10*6/uL — AB (ref 4.20–5.82)
RDW: 19.3 % — ABNORMAL HIGH (ref 11.0–14.6)
WBC: 5.9 10*3/uL (ref 4.0–10.3)
lymph#: 2.1 10*3/uL (ref 0.9–3.3)

## 2016-08-13 LAB — TSH: TSH: 1.289 m(IU)/L (ref 0.320–4.118)

## 2016-08-13 MED ORDER — BENZONATATE 100 MG PO CAPS: 200.0000 mg | ORAL_CAPSULE | Freq: Three times a day (TID) | ORAL | 1 refills | Status: DC | PRN

## 2016-08-13 NOTE — Progress Notes (Signed)
Spoke with Tammi at [facility] pathology. Requested MSI testing on [id number].

## 2016-08-13 NOTE — Progress Notes (Signed)
Patient not to receive chemotherapy treatment today per Dr. Benay Spice. Patient to be rescheduled for next week. Patient and patient's daughter aware and verbalized understanding. To report to scheduling on their way out. Patient stable upon discharge.

## 2016-08-13 NOTE — Telephone Encounter (Signed)
PATIENT SENT FROM INFUSION AREA TO RESCHEDULE TODAY'S CHEMO APPOINTMENT. CONFIRMED W/BS CHEMO TODAY DELAYED UNTIL NEXT WEEK AND 10/17 APPOINTMENTS TO BE PUSHED TO 10/23, HOWEVER PATIENT NEEDS Tuesday APPOINTMENTS AND NEW APPOINTMENTS SCHEDULED FOR Tuesday 10/3 AND 10/24. DTR GIVEN NEW SCHEDULE.

## 2016-08-14 ENCOUNTER — Encounter: Payer: Self-pay | Admitting: Pharmacist

## 2016-08-14 ENCOUNTER — Telehealth: Payer: Self-pay | Admitting: *Deleted

## 2016-08-14 NOTE — Telephone Encounter (Signed)
Pt came to office accompanied by daughter, signed [employer] access forms. Forms placed on Dr. Gearldine Shown desk for signature.

## 2016-08-14 NOTE — Progress Notes (Signed)
Pts insurance denied Keytruda.   I left enrollment forms with Dr. Gearldine Shown desk RN to get signed ASAP to attempt to get Keytruda from [employer] pt assistance program.  Pt also has to sign forms.  As soon as documents are all signed, the RN can fax to [employer].  This process may take 4-7 days before we have an answer.  He's been rescheduled to begin Keytruda Tues 08/20/16.  Kennith Center, Pharm.D., CPP 08/14/2016'@3'$ :05 PM

## 2016-08-14 NOTE — Telephone Encounter (Signed)
Left message on voicemail for pt's daughter to call office. Pharmacist has completed enrollment form for [employer]. Will need pt to come in to sign forms and provide additional information.

## 2016-08-18 ENCOUNTER — Other Ambulatory Visit: Payer: Self-pay | Admitting: Oncology

## 2016-08-19 ENCOUNTER — Telehealth: Payer: Self-pay | Admitting: *Deleted

## 2016-08-19 ENCOUNTER — Telehealth: Payer: Self-pay | Admitting: Oncology

## 2016-08-19 NOTE — Telephone Encounter (Signed)
vm to inform pt of 10/10 appt per LOS

## 2016-08-19 NOTE — Telephone Encounter (Signed)
Call placed to patient's daughter Mia to inform her that assistance for Beryle Flock is still not available and appt will be moved out until Friday.  Mia states that 08/27/16 would work better for transportation for patient.  Per Dr. Benay Spice, Jack to move pt.'s appts to 08/27/16.  Message sent to scheduling.

## 2016-08-20 ENCOUNTER — Encounter: Payer: Self-pay | Admitting: Pharmacist

## 2016-08-20 ENCOUNTER — Ambulatory Visit: Payer: BLUE CROSS/BLUE SHIELD

## 2016-08-20 ENCOUNTER — Other Ambulatory Visit: Payer: BLUE CROSS/BLUE SHIELD

## 2016-08-20 ENCOUNTER — Telehealth: Payer: Self-pay | Admitting: *Deleted

## 2016-08-20 NOTE — Telephone Encounter (Signed)
Received call from pt's daughter, Fernande Boyden, wanting to r/s pt's appt to this fri b/c pt is coughing & not feeling well.  Message routed to Dr Sherrill/Pod RN

## 2016-08-20 NOTE — Telephone Encounter (Signed)
Patient's appointment scheduled for Friday.  Spoke with patient's daughter Bruce [name] to confirm times.  Mai appreciative of call back.

## 2016-08-20 NOTE — Progress Notes (Signed)
AddendumBeryle Flock order copied and MD signed. I faxed to [facility] and received confirmation (08/20/16)  Kennith Center, Pharm.D., CPP 08/20/2016'@5'$ :46 PM

## 2016-08-20 NOTE — Progress Notes (Signed)
Received fax this afternoon from [facility] stating "we are evaluating your pt for PAP eligibility.  A case manager will be contacting you or your pt within 1 business day to discuss any additional documentation needed to complete the referral to the [employer].  In addition, please send the prescription for Beryle Flock to the [employer] vendor [facility] at fax # [phone].   If any questions, call [phone]."  I will relay this info to Dr. Benay Spice & staff to get a copy of the [place] orders to fax to [employer] as requested. I will f/u w/ [employer] Access program tomorrow after lunch if any of our staff has not heard from them by that time.  Kennith Center, Pharm.D., CPP 08/20/2016'@5'$ :20 PM

## 2016-08-21 ENCOUNTER — Encounter: Payer: Self-pay | Admitting: Pharmacist

## 2016-08-21 ENCOUNTER — Encounter: Payer: Self-pay | Admitting: Oncology

## 2016-08-21 ENCOUNTER — Telehealth: Payer: Self-pay | Admitting: Pharmacist

## 2016-08-21 NOTE — Telephone Encounter (Signed)
I contacted pts dtr Mai to try to get annual household income info in order to complete the request for [employer] for [place]. Fernande Boyden is at work currently and gets off at 7 pm so she may not be able to give [place] this information today.  I explained the sooner we have it, the better our chances are for having the drug for her father on Friday. She was going to call her father and see if she could have him look for the tax return to determine gross annual income. There are 4 members in the household. Kennith Center, Pharm.D., CPP 08/21/2016'@1'$ :57 PM

## 2016-08-21 NOTE — Progress Notes (Signed)
Received fax and notification that pt approved for free Keytruda until 08/21/17.  The 1st supply of Keytruda will be shipped out tomorrow (10/5) for delivery on Fri (10/6) by 10 am via [employer].  Pt infusion appt on Fri is at 1345. Pts dtr aware of above. She had to provide annual household income [phone]) & size (4 members). All pts paperwork is in pharmacy in pts chart if needed. Kennith Center, Pharm.D., CPP 08/21/2016'@4'$ :09 PM

## 2016-08-21 NOTE — Progress Notes (Signed)
Thanks, We will give the [facility] 10/6

## 2016-08-21 NOTE — Progress Notes (Signed)
Rcvd approval letter from [employer] for pt to receive free Keytruda from 08/21/16 to 08/21/17.  I emailed Kennith Center a copy of the approval letter.

## 2016-08-23 ENCOUNTER — Ambulatory Visit (HOSPITAL_BASED_OUTPATIENT_CLINIC_OR_DEPARTMENT_OTHER): Payer: BLUE CROSS/BLUE SHIELD

## 2016-08-23 ENCOUNTER — Other Ambulatory Visit (HOSPITAL_BASED_OUTPATIENT_CLINIC_OR_DEPARTMENT_OTHER): Payer: BLUE CROSS/BLUE SHIELD

## 2016-08-23 ENCOUNTER — Ambulatory Visit (HOSPITAL_COMMUNITY)
Admission: RE | Admit: 2016-08-23 | Discharge: 2016-08-23 | Disposition: A | Discharge status: Home / Self Care | Payer: BLUE CROSS/BLUE SHIELD | Source: Ambulatory Visit | Attending: Nurse Practitioner | Admitting: Nurse Practitioner

## 2016-08-23 ENCOUNTER — Ambulatory Visit: Payer: BLUE CROSS/BLUE SHIELD | Admitting: Nurse Practitioner

## 2016-08-23 VITALS — HR 108

## 2016-08-23 VITALS — BP 113/75 | HR 118 | Temp 97.7°F | Resp 17 | Ht 63.0 in | Wt 105.1 lb

## 2016-08-23 DIAGNOSIS — Z79899 Other long term (current) drug therapy: Secondary | ICD-10-CM

## 2016-08-23 DIAGNOSIS — J910 Malignant pleural effusion: Secondary | ICD-10-CM | POA: Diagnosis not present

## 2016-08-23 DIAGNOSIS — C7801 Secondary malignant neoplasm of right lung: Secondary | ICD-10-CM

## 2016-08-23 DIAGNOSIS — C7800 Secondary malignant neoplasm of unspecified lung: Secondary | ICD-10-CM

## 2016-08-23 DIAGNOSIS — C155 Malignant neoplasm of lower third of esophagus: Secondary | ICD-10-CM

## 2016-08-23 DIAGNOSIS — R1310 Dysphagia, unspecified: Secondary | ICD-10-CM

## 2016-08-23 DIAGNOSIS — Z5112 Encounter for antineoplastic immunotherapy: Secondary | ICD-10-CM

## 2016-08-23 DIAGNOSIS — Z9889 Other specified postprocedural states: Secondary | ICD-10-CM | POA: Insufficient documentation

## 2016-08-23 DIAGNOSIS — R0609 Other forms of dyspnea: Secondary | ICD-10-CM

## 2016-08-23 DIAGNOSIS — J90 Pleural effusion, not elsewhere classified: Secondary | ICD-10-CM | POA: Insufficient documentation

## 2016-08-23 DIAGNOSIS — R05 Cough: Secondary | ICD-10-CM

## 2016-08-23 LAB — CBC WITH DIFFERENTIAL/PLATELET
BASO%: 0.8 % (ref 0.0–2.0)
BASOS ABS: 0 10*3/uL (ref 0.0–0.1)
EOS ABS: 0.2 10*3/uL (ref 0.0–0.5)
EOS%: 3.1 % (ref 0.0–7.0)
HEMATOCRIT: 35.4 % — AB (ref 38.4–49.9)
HEMOGLOBIN: 11.7 g/dL — AB (ref 13.0–17.1)
LYMPH#: 1.8 10*3/uL (ref 0.9–3.3)
LYMPH%: 29.3 % (ref 14.0–49.0)
MCH: 28.9 pg (ref 27.2–33.4)
MCHC: 33.1 g/dL (ref 32.0–36.0)
MCV: 87.2 fL (ref 79.3–98.0)
MONO#: 0.8 10*3/uL (ref 0.1–0.9)
MONO%: 13 % (ref 0.0–14.0)
NEUT#: 3.4 10*3/uL (ref 1.5–6.5)
NEUT%: 53.8 % (ref 39.0–75.0)
Platelets: 240 10*3/uL (ref 140–400)
RBC: 4.06 10*6/uL — ABNORMAL LOW (ref 4.20–5.82)
RDW: 18.4 % — AB (ref 11.0–14.6)
WBC: 6.3 10*3/uL (ref 4.0–10.3)

## 2016-08-23 LAB — COMPREHENSIVE METABOLIC PANEL
ALBUMIN: 3 g/dL — AB (ref 3.5–5.0)
ALK PHOS: 86 U/L (ref 40–150)
ALT: 19 U/L (ref 0–55)
AST: 24 U/L (ref 5–34)
Anion Gap: 7 mEq/L (ref 3–11)
BUN: 11.5 mg/dL (ref 7.0–26.0)
CALCIUM: 8.8 mg/dL (ref 8.4–10.4)
CO2: 21 mEq/L — ABNORMAL LOW (ref 22–29)
Chloride: 103 mEq/L (ref 98–109)
Creatinine: 0.9 mg/dL (ref 0.7–1.3)
Glucose: 196 mg/dl — ABNORMAL HIGH (ref 70–140)
POTASSIUM: 3.8 meq/L (ref 3.5–5.1)
Sodium: 131 mEq/L — ABNORMAL LOW (ref 136–145)
Total Bilirubin: 0.53 mg/dL (ref 0.20–1.20)
Total Protein: 8.9 g/dL — ABNORMAL HIGH (ref 6.4–8.3)

## 2016-08-23 LAB — TSH: TSH: 0.812 m(IU)/L (ref 0.320–4.118)

## 2016-08-23 MED ORDER — SODIUM CHLORIDE 0.9 % IV SOLN
Freq: Once | INTRAVENOUS | Status: AC
  Administered 2016-08-23: 15:00:00 via INTRAVENOUS

## 2016-08-23 MED ORDER — LORAZEPAM 0.5 MG PO TABS: 0.5000 mg | ORAL_TABLET | Freq: Every evening | ORAL | 0 refills | Status: DC | PRN

## 2016-08-23 MED ORDER — SODIUM CHLORIDE 0.9 % IV SOLN
200.0000 mg | Freq: Once | INTRAVENOUS | Status: AC
  Administered 2016-08-23: 200 mg via INTRAVENOUS
  Filled 2016-08-23: qty 8

## 2016-08-23 NOTE — Progress Notes (Signed)
Use of [place] interpreter TAI [id number] via [employer] video interpreter.

## 2016-08-23 NOTE — Progress Notes (Signed)
Lattie Haw NP aware of HR prior to chemo, OK to treat, no new orders.

## 2016-08-23 NOTE — Progress Notes (Addendum)
**Bruce [name]** Bruce Bruce [name]   Diagnosis:  Esophagus cancer  INTERVAL HISTORY:   Bruce Bruce [name] returns as scheduled. He reports an increased cough and shortness of breath. Appetite is poor. He is experiencing dysphagia with certain pills, specifically Bruce Bruce [name] and Bruce Bruce [name]. No dysphagia with food/liquids.  Objective:  Vital signs in last 24 hours:  Blood pressure 113/75, pulse (!) 118, temperature 97.7 F (36.5 C), temperature source Axillary, resp. rate 17, height _0  (1.6 m), weight 105 lb 1.6 oz (47.7 kg), SpO2 99 %.    HEENT: No thrush or ulcers. Lymphatics: Bilateral cervical/low neck adenopathy, Resp: Diminished breath sounds right lower lung field. No respiratory distress. Cardio: Regular, tachycardic.  GI: No hepatomegaly. Vascular: No leg edema.   Lab Results:  Lab Results  Component Value Date   WBC 6.3 08/23/2016   HGB 11.7 (L) 08/23/2016   HCT 35.4 (L) 08/23/2016   MCV 87.2 08/23/2016   PLT 240 08/23/2016   NEUTROABS 3.4 08/23/2016    Imaging:  No results found.  Medications: I have reviewed the patient's current medications.  Assessment/Plan: 1. Squamous cell carcinoma. Staging CT scans of the chest, abdomen and pelvis on 06/02/2012 negative for evidence of metastatic disease. Endoscopic ultrasound 06/11/2012 confirmed a uT3 uN1 tumor at 40 cm from the incisors. He began radiation 06/15/2012. He began weekly Taxol/carboplatin chemotherapy 06/16/2012. He completed 6 weekly chemotherapy treatments. The carboplatin was held with week 5 due to thrombocytopenia. He completed the sixth weekly treatment with Taxol and carboplatin on 07/23/2012. He completed radiation on 07/29/2012 -he underwent an esophagectomy on 08/26/2012 with the pathology confirming a ypT3,ypN0 squamous cell carcinoma with perineural invasion identified.  2. History of solid/liquid dysphagia secondary to the obstructing esophagus mass.  3. Reflux. He is taking  Protonix. 4. Hospitalization 07/19/2014 with abdominal pain and vomiting. CT scan showed a mid small bowel obstruction without discrete mass or evident etiology. Small bowel obstruction felt to be secondary to adhesions. 5. Renal failure during the 07/19/2014 hospitalization felt to be contrast-induced nephropathy. 6. Chest CT 06/15/2013 with postoperative changes from esophagectomy and gastric pull-through. 7 mm posterior subpleural nodule in the right lower lobe. Followup CT scan 12/16/2013 revealed an increase in the size of a right hilar node and right upper lobe nodule.  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  Chest CT 12/23/2014 with enlarged right hilar lymph node measuring 1.2 cm, previously 0.8 cm. Slight enlargement of a periaortic lymph node, 8 mm as compared to 7 mm on the previous study. Increased thickening of the right pleural rind. Moderate sized right pleural effusion.  Chest x-ray 11/28/2015, no change in right pleural effusion  Chest x-ray 04/18/2016 with increase in the right pleural effusion.  Thoracentesis 04/24/2016-only 25 mL of pleural fluid could be obtained  CT chest 04/24/2016-progressive mediastinal/hilar lymphadenopathy, large right pleural effusion with mixed density, interstitial thickening in the right lower lung  concerning for  lymphatic tumor spread  Salvage chemotherapy with weekly Taxol/carboplatin initiated 04/30/2016  Cycle 2 weekly Taxol/carboplatin 06/11/2016(chemotherapy dose reduced due to neutropenia); week 3 held on 06/25/2016 due to neutropenia.   Schedule adjusted to every other week beginning 07/02/2016  07/02/2016 he developed nausea/vomiting, headache and facial flushing during the carboplatin infusion   Carboplatin eliminated from the regimen beginning 07/16/2016  CT chest 07/29/2016-mildly improved mediastinal lymphadenopathy,persistent right pleural effusion, possible new left lower lobe nodules  PD-L1 10%  Cycle 1 Pembrolizumab 08/23/2016 7. Headaches. Negative brain CT 12/23/2014.   Disposition: Bruce Bruce [name] has a worsening cough and shortness of breath. We referred him for a chest x-ray today. The right pleural effusion is stable. Plan to proceed with cycle 1 Pembrolizumab today as scheduled. We again reviewed potential toxicities associate with Pembrolizumab. He is agreeable to proceed.  He is experiencing dysphagia with Bruce Bruce [name] and Bruce Bruce [name]. He will discontinue both. He will try Ativan 0.5 mg at bedtime as needed for sleep.  He will return for a follow-up visit and cycle 2 Pembrolizumab in 3 weeks. He will contact the office in the interim with any problems.  Patient seen with Dr. Benay Spice. 25 minutes were spent face-to-face at today's visit with the majority of that time involved in counseling/coordination of care.  Ned Card ANP/GNP-BC   08/23/2016  12:42 PM  This was a shared visit with Ned Card. His overall status appears unchanged. He has been approved for pembrolizumab via the pharmaceutical company. We discussed immunotherapy versus salvage chemotherapy with Bruce [name] and his daughter. We reviewed the potential toxicities associated with immunotherapy. We decided to begin treatment with pembrolizumab today.   He will return for an  office visit and chemotherapy in 3 weeks.  Julieanne Manson, M.D.

## 2016-08-23 NOTE — Patient Instructions (Addendum)
[facility] Discharge Instructions for Patients Receiving Chemotherapy  Today you received the following chemotherapy agents Keytruda. To help prevent nausea and vomiting after your treatment, we encourage you to take your nausea medication as directed.   If you develop nausea and vomiting that is not controlled by your nausea medication, call the clinic.   BELOW ARE SYMPTOMS THAT SHOULD BE REPORTED IMMEDIATELY:  *FEVER GREATER THAN 100.5 F  *CHILLS WITH OR WITHOUT FEVER  NAUSEA AND VOMITING THAT IS NOT CONTROLLED WITH YOUR NAUSEA MEDICATION  *UNUSUAL SHORTNESS OF BREATH  *UNUSUAL BRUISING OR BLEEDING  TENDERNESS IN MOUTH AND THROAT WITH OR WITHOUT PRESENCE OF ULCERS  *URINARY PROBLEMS  *BOWEL PROBLEMS  UNUSUAL RASH Items with * indicate a potential emergency and should be followed up as soon as possible.  Feel free to call the clinic you have any questions or concerns. The clinic phone number is [phone].  Please show the [facility] at check-in to the Emergency Department and triage nurse.   Pembrolizumab injection What is this medicine? PEMBROLIZUMAB (pem broe liz ue mab) is a monoclonal antibody. It is used to treat melanoma and non-small cell lung cancer. This medicine may be used for other purposes; ask your health care provider or pharmacist if you have questions. What should I tell my health care provider before I take this medicine? They need to know if you have any of these conditions: -diabetes -immune system problems -inflammatory bowel disease -liver disease -lung or breathing disease -lupus -an unusual or allergic reaction to pembrolizumab, other medicines, foods, dyes, or preservatives -pregnant or trying to get pregnant -breast-feeding How should I use this medicine? This medicine is for infusion into a vein. It is given by a health care professional in a hospital or clinic setting. A special MedGuide will be given to you  before each treatment. Be sure to read this information carefully each time. Talk to your pediatrician regarding the use of this medicine in children. Special care may be needed. Overdosage: If you think you have taken too much of this medicine contact a poison control center or emergency room at once. NOTE: This medicine is only for you. Do not share this medicine with others. What if I miss a dose? It is important not to miss your dose. Call your doctor or health care professional if you are unable to keep an appointment. What may interact with this medicine? Interactions have not been studied. Give your health care provider a list of all the medicines, herbs, non-prescription drugs, or dietary supplements you use. Also tell them if you smoke, drink alcohol, or use illegal drugs. Some items may interact with your medicine. This list may not describe all possible interactions. Give your health care provider a list of all the medicines, herbs, non-prescription drugs, or dietary supplements you use. Also tell them if you smoke, drink alcohol, or use illegal drugs. Some items may interact with your medicine. What should I watch for while using this medicine? Your condition will be monitored carefully while you are receiving this medicine. You may need blood work done while you are taking this medicine. Do not become pregnant while taking this medicine or for 4 months after stopping it. Women should inform their doctor if they wish to become pregnant or think they might be pregnant. There is a potential for serious side effects to an unborn child. Talk to your health care professional or pharmacist for more information. Do not breast-feed an infant while  taking this medicine or for 4 months after the last dose. What side effects may I notice from receiving this medicine? Side effects that you should report to your doctor or health care professional as soon as possible: -allergic reactions like skin  rash, itching or hives, swelling of the face, lips, or tongue -bloody or black, tarry stools -breathing problems -change in the amount of urine -changes in vision -chest pain -chills -dark urine -dizziness or feeling faint or lightheaded -fast or irregular heartbeat -fever -flushing -hair loss -muscle pain -muscle weakness -persistent headache -signs and symptoms of high blood sugar such as dizziness; dry mouth; dry skin; fruity breath; nausea; stomach pain; increased hunger or thirst; increased urination -signs and symptoms of liver injury like dark urine, light-colored stools, loss of appetite, nausea, right upper belly pain, yellowing of the eyes or skin -stomach pain -weight loss Side effects that usually do not require medical attention (Report these to your doctor or health care professional if they continue or are bothersome.):constipation -cough -diarrhea -joint pain -tiredness This list may not describe all possible side effects. Call your doctor for medical advice about side effects. You may report side effects to FDA at 1-800-FDA-1088. Where should I keep my medicine? This drug is given in a hospital or clinic and will not be stored at home. NOTE: This sheet is a summary. It may not cover all possible information. If you have questions about this medicine, talk to your doctor, pharmacist, or health care provider.    2016, Elsevier/Gold Standard. (2015-01-03 17:24:19)

## 2016-08-27 ENCOUNTER — Other Ambulatory Visit: Payer: BLUE CROSS/BLUE SHIELD

## 2016-08-27 ENCOUNTER — Ambulatory Visit: Payer: BLUE CROSS/BLUE SHIELD

## 2016-08-29 ENCOUNTER — Telehealth: Payer: Self-pay | Admitting: *Deleted

## 2016-08-29 MED ORDER — TRAMADOL HCL 50 MG PO TABS: 50.0000 mg | ORAL_TABLET | Freq: Four times a day (QID) | ORAL | 0 refills | Status: DC | PRN

## 2016-08-29 NOTE — Telephone Encounter (Signed)
Call from pt's daughter reporting shoulder pain. Patient is requesting medication. Sitting up and ambulation exacerbate pain.  He reports bilateral posterior shoulder pain but left is worse. Pt also reports increased shortness of breath. Discussed with Ned Card, NP: Order received for Tramadol 50 mg Q 6 hours as needed. Rx called to pharmacy. Called pt's daughter with instructions. Instructed her to call office with any changes.  Daughter stated pt is requesting to change insomnia med. Restoril isn't helping him sleep. Recommended he wait to make any other medication adjustments until after he has taken Tramadol to see if he tolerates this OK. She voiced understanding.

## 2016-09-03 ENCOUNTER — Other Ambulatory Visit: Payer: BLUE CROSS/BLUE SHIELD

## 2016-09-03 ENCOUNTER — Ambulatory Visit: Payer: BLUE CROSS/BLUE SHIELD

## 2016-09-03 ENCOUNTER — Ambulatory Visit: Payer: BLUE CROSS/BLUE SHIELD | Admitting: Oncology

## 2016-09-04 ENCOUNTER — Encounter: Payer: Self-pay | Admitting: Oncology

## 2016-09-04 NOTE — Progress Notes (Signed)
Called [employer] [phone] option 3) for Keytruda refill for pt's 09/10/16 dos.  Notified Arbie Cookey in the pharmacy.

## 2016-09-05 ENCOUNTER — Encounter: Payer: Self-pay | Admitting: Oncology

## 2016-09-05 ENCOUNTER — Ambulatory Visit (HOSPITAL_BASED_OUTPATIENT_CLINIC_OR_DEPARTMENT_OTHER): Payer: BLUE CROSS/BLUE SHIELD | Admitting: Oncology

## 2016-09-05 ENCOUNTER — Ambulatory Visit (HOSPITAL_COMMUNITY)
Admission: RE | Admit: 2016-09-05 | Discharge: 2016-09-05 | Disposition: A | Discharge status: Home / Self Care | Payer: BLUE CROSS/BLUE SHIELD | Source: Ambulatory Visit | Attending: Oncology | Admitting: Oncology

## 2016-09-05 ENCOUNTER — Telehealth: Payer: Self-pay | Admitting: *Deleted

## 2016-09-05 ENCOUNTER — Other Ambulatory Visit: Payer: Self-pay | Admitting: *Deleted

## 2016-09-05 DIAGNOSIS — R627 Adult failure to thrive: Secondary | ICD-10-CM

## 2016-09-05 DIAGNOSIS — R05 Cough: Secondary | ICD-10-CM | POA: Diagnosis not present

## 2016-09-05 DIAGNOSIS — C155 Malignant neoplasm of lower third of esophagus: Secondary | ICD-10-CM | POA: Diagnosis not present

## 2016-09-05 DIAGNOSIS — R0602 Shortness of breath: Secondary | ICD-10-CM

## 2016-09-05 DIAGNOSIS — C7801 Secondary malignant neoplasm of right lung: Secondary | ICD-10-CM

## 2016-09-05 DIAGNOSIS — J90 Pleural effusion, not elsewhere classified: Secondary | ICD-10-CM | POA: Diagnosis not present

## 2016-09-05 DIAGNOSIS — J910 Malignant pleural effusion: Secondary | ICD-10-CM

## 2016-09-05 MED ORDER — PREDNISONE 10 MG PO TABS: 10.0000 mg | ORAL_TABLET | Freq: Every day | ORAL | 0 refills | Status: DC

## 2016-09-05 MED ORDER — ALBUTEROL SULFATE HFA 108 (90 BASE) MCG/ACT IN AERS: 2.0000 | INHALATION_SPRAY | RESPIRATORY_TRACT | 1 refills | Status: DC | PRN

## 2016-09-05 MED ORDER — MORPHINE SULFATE (CONCENTRATE) 10 MG /0.5 ML PO SOLN: 10.0000 mg | ORAL | 0 refills | Status: DC | PRN

## 2016-09-05 NOTE — Telephone Encounter (Signed)
Call received from patient's daughter stating that patient has been having SOB and heavy breathing for 2 days, eating once a day only, having nausea unrelieved with Compazine, dizziness and right shoulder pain that is slightly relieved with Tramadol.  Dr. Benay Spice notified of above symptoms and order received for patient to come in now to [facility] and to have CXR prior to arrival to [facility]  Patient's daughter, Fernande Boyden notified of Dr. Gearldine Shown orders and will bring patient in now.

## 2016-09-05 NOTE — Progress Notes (Signed)
SYMPTOM MANAGEMENT CLINIC    Chief Complaint: shortness of breath  HPI:  Bruce [name] 55 y.o. male diagnosed with esophageal cancer seen today for increasing shortness of breath. Received his first does of Keytruda approximately 2 weeks ago. Daughter reports that his cough has improved, but he has been more short of breath over the past 3-4 days. Eating causes him to be more short of breath, but he is able to drink without much difficulty. Trying to drink Ensure. Denies fevers. Has pain to his left shoulder. Has a general discomfort to his chest. Has nausea without vomiting. Denies diarrhea and rash.    No history exists.    Review of Systems  Constitutional: Positive for malaise/fatigue and weight loss. Negative for chills and fever.  HENT: Negative.   Eyes: Negative.   Respiratory: Positive for cough and shortness of breath. Negative for hemoptysis, sputum production and wheezing.        Cough improved.  Cardiovascular: Negative for palpitations and leg swelling.       Generalized chest discomfort.  Gastrointestinal: Positive for nausea. Negative for abdominal pain, blood in stool, constipation, diarrhea and vomiting.  Genitourinary: Negative.   Musculoskeletal: Positive for myalgias.  Skin: Negative.   Neurological: Positive for dizziness. Negative for tingling, tremors, sensory change, speech change, focal weakness, seizures and loss of consciousness.  Endo/Heme/Allergies: Negative.   Psychiatric/Behavioral: Negative.     Past Medical History:  Diagnosis Date  . Cancer ([place]) 05/28/12   bx=esophagus=squamous cell carcinoma  . Dysphasia    solid and liquid  . Esophagitis   . Lung cancer ([place]) 12/27/13   right upper lung  . Mass of esophagus 05/28/2012   BX'D DISTAL ESOPHAGUS,PENDING  . Neuropathy ([place])    right hand numbness 1 year 2012    Past Surgical History:  Procedure Laterality Date  . COMPLETE ESOPHAGECTOMY  08/26/2012   Procedure: ESOPHAGECTOMY COMPLETE;   Surgeon: [name], MD;  Location: [facility] OR;  Service: Thoracic;  Laterality: N/A;  transhiatal   . 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 EUS  06/11/2012   Procedure: UPPER ENDOSCOPIC ULTRASOUND (EUS) RADIAL;  Surgeon: Milus Banister, MD;  Location: [facility] ENDOSCOPY;  Service: Endoscopy;  Laterality: N/A;  . JEJUNOSTOMY  08/26/2012   Procedure: JEJUNOSTOMY;  Surgeon: [name], MD;  Location: [facility];  Service: Thoracic;  Laterality: N/A;  placement of feeding jujunostomy tube  . LYMPH NODE DISSECTION Right 01/31/2014   Procedure: LYMPH NODE DISSECTION;  Surgeon: [name], MD;  Location: [facility];  Service: Thoracic;  Laterality: Right;  . PYLOROPLASTY  08/26/2012   Procedure: PYLOROPLASTY;  Surgeon: [name], MD;  Location: [facility];  Service: Thoracic;  Laterality: N/A;  . VIDEO ASSISTED THORACOSCOPY (VATS)/WEDGE RESECTION Right 01/31/2014   Procedure: VIDEO ASSISTED THORACOSCOPY (VATS)/WEDGE RESECTION; with insertion of On Q pain pump;  Surgeon: [name], MD;  Location: [facility];  Service: Thoracic;  Laterality: Right;  with insertion of On Q pain pump  . VIDEO BRONCHOSCOPY  08/26/2012   Procedure: VIDEO BRONCHOSCOPY;  Surgeon: [name], MD;  Location: [facility];  Service: Thoracic;  Laterality: N/A;  . 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    is allergic to carboplatin.    Medication List       Accurate as of 09/05/16  9:07 PM. Always use your most recent med list.          albuterol 108 (90 Base) MCG/ACT inhaler Commonly known as:  PROVENTIL HFA;VENTOLIN HFA Inhale  2 puffs into the lungs every 4 (four) hours as needed for wheezing or shortness of breath.   benzonatate 100 MG capsule Commonly known as:  TESSALON PERLES Take 2 capsules (200 mg total) by mouth 3 (three) times daily as needed for cough.   LORazepam 0.5 MG tablet Commonly known as:  ATIVAN Take 1 tablet (0.5 mg total) by mouth at bedtime as needed for sleep.   morphine CONCENTRATE 10 mg / 0.5 ml concentrated solution Place 0.5-1 mLs (10-20 mg total) under the tongue every 4 (four) hours as needed for severe pain or shortness of breath.   pantoprazole 40 MG tablet Commonly known as:  PROTONIX Take 1 tablet (40 mg total) by mouth daily.   polyethylene glycol packet Commonly known as:  MIRALAX Take 17 g by mouth daily.   predniSONE 10 MG tablet Commonly known as:  DELTASONE Take 1 tablet (10 mg total) by mouth daily with breakfast.   prochlorperazine 10 MG tablet Commonly known as:  COMPAZINE Take 1 tablet (10 mg total) by mouth every 6 (six) hours as needed for nausea or vomiting.   temazepam 15 MG capsule Commonly known as:  RESTORIL Take 1 capsule (15 mg total) by mouth at bedtime as needed for sleep.   THERAFLU COLD & COUGH PO Take by mouth.   traMADol 50 MG tablet Commonly known as:  ULTRAM Take 1 tablet (50 mg total) by mouth every 6 (six) hours as needed.        PHYSICAL EXAMINATION  Oncology Vitals 09/05/2016 08/23/2016  Height 160 cm -  Weight 45.541 kg -  Weight (lbs) 100 lbs 6 oz -  BMI (kg/m2) 17.79 kg/m2 -  Temp 98.1 -  Pulse 122 108  Resp 19 -  Resp (Historical as of 06/18/12) - -  SpO2 97 -  BSA (m2) 1.42 m2 -   BP Readings from Last 2 Encounters:  09/05/16 94/71  08/23/16 113/75    Physical Exam  Constitutional: He appears unhealthy. He appears cachectic.  HENT:  Head: Normocephalic and atraumatic.  Mouth/Throat: Oropharynx is clear and moist.  Neck: Normal range of motion. Neck supple. No tracheal deviation present.  Cardiovascular:    Tachycardic  Pulmonary/Chest: He has wheezes.  Diminished breath sounds R lung.  Abdominal: Soft. Bowel sounds are normal.  Musculoskeletal: He exhibits no edema.  Lymphadenopathy:    He has no cervical adenopathy.  Neurological: He is alert.  Skin: Skin is warm and dry.  Psychiatric: Affect normal.  Vitals reviewed.   LABORATORY DATA:. No visits with results within 3 Day(s) from this visit.  Latest known visit with results is:  Appointment on 08/23/2016  Component Date Value Ref Range Status  . WBC 08/23/2016 6.3  4.0 - 10.3 10e3/uL Final  . NEUT# 08/23/2016 3.4  1.5 - 6.5 10e3/uL Final  . HGB 08/23/2016 11.7* 13.0 - 17.1 g/dL Final  . HCT 08/23/2016 35.4* 38.4 - 49.9 % Final  . Platelets 08/23/2016 240  140 - 400 10e3/uL Final  . MCV 08/23/2016 87.2  79.3 - 98.0 fL Final  . MCH 08/23/2016 28.9  27.2 - 33.4 pg Final  . MCHC 08/23/2016 33.1  32.0 - 36.0 g/dL Final  . RBC 08/23/2016 4.06* 4.20 - 5.82 10e6/uL Final  . RDW 08/23/2016 18.4* 11.0 - 14.6 % Final  . lymph# 08/23/2016 1.8  0.9 - 3.3 10e3/uL Final  . MONO# 08/23/2016 0.8  0.1 - 0.9 10e3/uL Final  . Eosinophils Absolute 08/23/2016 0.2  0.0 - 0.5 10e3/uL Final  . Basophils Absolute 08/23/2016 0.0  0.0 - 0.1 10e3/uL Final  . NEUT% 08/23/2016 53.8  39.0 - 75.0 % Final  . LYMPH% 08/23/2016 29.3  14.0 - 49.0 % Final  . MONO% 08/23/2016 13.0  0.0 - 14.0 % Final  . EOS% 08/23/2016 3.1  0.0 - 7.0 % Final  . BASO% 08/23/2016 0.8  0.0 - 2.0 % Final  . Sodium 08/23/2016 131* 136 - 145 mEq/L Final  . Potassium 08/23/2016 3.8  3.5 - 5.1 mEq/L Final  . Chloride 08/23/2016 103  98 - 109 mEq/L Final  . CO2 08/23/2016 21* 22 - 29 mEq/L Final  . Glucose 08/23/2016 196* 70 - 140 mg/dl Final  . BUN 08/23/2016 11.5  7.0 - 26.0 mg/dL Final  . Creatinine 08/23/2016 0.9  0.7 - 1.3 mg/dL Final  . Total Bilirubin 08/23/2016 0.53  0.20 - 1.20 mg/dL Final  . Alkaline Phosphatase 08/23/2016 86  40 - 150 U/L Final  . AST 08/23/2016 24  5 -  34 U/L Final  . ALT 08/23/2016 19  0 - 55 U/L Final  . Total Protein 08/23/2016 8.9* 6.4 - 8.3 g/dL Final  . Albumin 08/23/2016 3.0* 3.5 - 5.0 g/dL Final  . Calcium 08/23/2016 8.8  8.4 - 10.4 mg/dL Final  . Anion Gap 08/23/2016 7  3 - 11 mEq/L Final  . EGFR 08/23/2016 >90  >90 ml/min/1.73 m2 Final  . TSH 08/23/2016 0.812  0.320 - 4.118 m(IU)/L Final    RADIOGRAPHIC STUDIES: Dg Chest 2 View  Result Date: 09/05/2016 CLINICAL DATA:  Shortness of breath.  Lung and esophageal carcinoma EXAM: CHEST  2 VIEW COMPARISON:  August 23, 2016 FINDINGS: Loculated pleural effusion remains on the right. There is widespread reticulonodular interstitial disease which appears stable. There is scarring in the right perihilar/medial right upper lobe region, stable. There is no evident new opacity. The heart size and pulmonary vascularity are normal. No adenopathy is evident. No bone lesions. Patient has had gastric swingthrough procedure with ill-defined opacity in the lower chest region. IMPRESSION: Loculated effusion the right. Scarring with retraction medial right upper lobe/right perihilar region, stable. Diffuse reticulonodular interstitial disease. Suspect fibrosis as most likely etiology. Should be noted that a degree of lymphangitic spread of tumor could present in this manner. Air and ill-defined opacity in the lower thoracic region is consistent with previous gastric swingthrough procedure. Overall appearance is stable compared to most recent study. Electronically Signed   By: Lowella Grip III M.D.   On: 09/05/2016 14:35    ASSESSMENT/PLAN:    No problem-specific Assessment & Plan notes found for this encounter. Patient with esophageal cancer now with increasing shortness of breath. CXR from today shows right loculated pleural effusion which is relatively stable compared with prior CXR. CXR concerning for possible lymphangitic spread of tumor which may be causing his shortness of breath. Patient  ambulated in our office and O2 sats declined to 92% which does not qualify him for home O2.   Prescription given today for Roxanol 0.5cc-1cc every 4 hours as needed to be used for severe pain  or dyspnea. Will also begin Prednisone 10 mg daily. He was given a prescription for an albuterol inhaler to have at home if needed.  Patient seen and examined with Dr. Benay Spice. Spoke with daughter and explained that shortness of breath may be due to the spread of the cancer. May need to consider referral to Hospice if not improving. Daughter would like to try another cycle of Keytruda. Plan is to re-evaluate the patient next week and proceed with Keytruda if improving and Hospice referral if he is not better.  Told patient to contact us if not improving. If after hours or over the weekend, he may need to be see in the ER.  Patient's daughter stated understanding of all instructions; and was in agreement with this plan of care. The patient knows to call the clinic with any problems, questions or concerns.   Mikey Bussing, NP 09/05/2016  This was a shared visit with Mikey Bussing. Bruce [name] was interviewed and examined. I reviewed the chest x-ray and discussed the situation with his daughter. I am concerned the dyspnea and failure to thrive are related to disease progression, likely lymphangitic tumor spread in the chest. His cough has improved and he is only completed 1 treatment with immunotherapy. I doubt he is experiencing acute toxicity from the immunotherapy.  He will begin a trial of low-dose prednisone and Roxanol. He will return for an office visit 09/09/2016.  Julieanne Manson, M.D.

## 2016-09-06 ENCOUNTER — Telehealth: Payer: Self-pay | Admitting: *Deleted

## 2016-09-06 ENCOUNTER — Other Ambulatory Visit: Payer: Self-pay | Admitting: *Deleted

## 2016-09-06 MED ORDER — MORPHINE SULFATE (CONCENTRATE) 10 MG /0.5 ML PO SOLN: 5.0000 mg | ORAL | 0 refills | Status: DC | PRN

## 2016-09-06 NOTE — Telephone Encounter (Signed)
[facility] pharmacist "Kito"  Called concerned about patient's new liquid morphine script.  He is asking if the patient has had narcotics recently.  Reviewed patient's history and office visit yesterday with pharmacist.  Pharmacist states he does not routinely do oncology and he is still not comfortable filling this script.  He would prefer for Dr. Benay Spice to call him and discuss this.  Pharmacist call back is [phone].

## 2016-09-06 NOTE — Telephone Encounter (Signed)
Call placed to patient's daughter Fernande Boyden to inform her per Dr. Benay Spice to continue with 10 mg dose of Morphine, to hold for sedation and to decrease dose to 5 mg if needed.  Mai appreciative of call and has no questions at this time.

## 2016-09-09 ENCOUNTER — Telehealth: Payer: Self-pay | Admitting: Nurse Practitioner

## 2016-09-09 ENCOUNTER — Other Ambulatory Visit (HOSPITAL_BASED_OUTPATIENT_CLINIC_OR_DEPARTMENT_OTHER): Payer: BLUE CROSS/BLUE SHIELD

## 2016-09-09 ENCOUNTER — Ambulatory Visit (HOSPITAL_BASED_OUTPATIENT_CLINIC_OR_DEPARTMENT_OTHER): Payer: BLUE CROSS/BLUE SHIELD

## 2016-09-09 ENCOUNTER — Ambulatory Visit (HOSPITAL_BASED_OUTPATIENT_CLINIC_OR_DEPARTMENT_OTHER): Payer: BLUE CROSS/BLUE SHIELD | Admitting: Nurse Practitioner

## 2016-09-09 VITALS — HR 116

## 2016-09-09 DIAGNOSIS — C7801 Secondary malignant neoplasm of right lung: Secondary | ICD-10-CM

## 2016-09-09 DIAGNOSIS — Z79899 Other long term (current) drug therapy: Secondary | ICD-10-CM

## 2016-09-09 DIAGNOSIS — R05 Cough: Secondary | ICD-10-CM

## 2016-09-09 DIAGNOSIS — C7800 Secondary malignant neoplasm of unspecified lung: Secondary | ICD-10-CM

## 2016-09-09 DIAGNOSIS — R0609 Other forms of dyspnea: Secondary | ICD-10-CM

## 2016-09-09 DIAGNOSIS — C155 Malignant neoplasm of lower third of esophagus: Secondary | ICD-10-CM

## 2016-09-09 DIAGNOSIS — J910 Malignant pleural effusion: Secondary | ICD-10-CM

## 2016-09-09 DIAGNOSIS — Z5112 Encounter for antineoplastic immunotherapy: Secondary | ICD-10-CM

## 2016-09-09 DIAGNOSIS — R490 Dysphonia: Secondary | ICD-10-CM

## 2016-09-09 LAB — COMPREHENSIVE METABOLIC PANEL
ALT: 28 U/L (ref 0–55)
AST: 30 U/L (ref 5–34)
Albumin: 3 g/dL — ABNORMAL LOW (ref 3.5–5.0)
Alkaline Phosphatase: 82 U/L (ref 40–150)
Anion Gap: 9 mEq/L (ref 3–11)
BUN: 11.2 mg/dL (ref 7.0–26.0)
CHLORIDE: 102 meq/L (ref 98–109)
CO2: 21 meq/L — AB (ref 22–29)
CREATININE: 1 mg/dL (ref 0.7–1.3)
Calcium: 9.2 mg/dL (ref 8.4–10.4)
EGFR: 88 mL/min/{1.73_m2} — ABNORMAL LOW (ref >90)
Glucose: 215 mg/dl — ABNORMAL HIGH (ref 70–140)
POTASSIUM: 3.7 meq/L (ref 3.5–5.1)
SODIUM: 132 meq/L — AB (ref 136–145)
Total Bilirubin: 0.52 mg/dL (ref 0.20–1.20)
Total Protein: 9.2 g/dL — ABNORMAL HIGH (ref 6.4–8.3)

## 2016-09-09 LAB — CBC WITH DIFFERENTIAL/PLATELET
BASO%: 0.7 % (ref 0.0–2.0)
Basophils Absolute: 0 10*3/uL (ref 0.0–0.1)
EOS%: 1 % (ref 0.0–7.0)
Eosinophils Absolute: 0.1 10*3/uL (ref 0.0–0.5)
HCT: 38 % — ABNORMAL LOW (ref 38.4–49.9)
HGB: 12.5 g/dL — ABNORMAL LOW (ref 13.0–17.1)
LYMPH#: 1.9 10*3/uL (ref 0.9–3.3)
LYMPH%: 27.6 % (ref 14.0–49.0)
MCH: 28.9 pg (ref 27.2–33.4)
MCHC: 32.9 g/dL (ref 32.0–36.0)
MCV: 87.8 fL (ref 79.3–98.0)
MONO#: 0.5 10*3/uL (ref 0.1–0.9)
MONO%: 7.3 % (ref 0.0–14.0)
NEUT#: 4.4 10*3/uL (ref 1.5–6.5)
NEUT%: 63.4 % (ref 39.0–75.0)
Platelets: 255 10*3/uL (ref 140–400)
RBC: 4.33 10*6/uL (ref 4.20–5.82)
RDW: 16.7 % — ABNORMAL HIGH (ref 11.0–14.6)
WBC: 6.9 10*3/uL (ref 4.0–10.3)

## 2016-09-09 LAB — TSH: TSH: 1.009 m(IU)/L (ref 0.320–4.118)

## 2016-09-09 MED ORDER — SODIUM CHLORIDE 0.9 % IV SOLN
200.0000 mg | Freq: Once | INTRAVENOUS | Status: AC
  Administered 2016-09-09: 200 mg via INTRAVENOUS
  Filled 2016-09-09: qty 8

## 2016-09-09 MED ORDER — SODIUM CHLORIDE 0.9 % IV SOLN
Freq: Once | INTRAVENOUS | Status: AC
  Administered 2016-09-09: 15:00:00 via INTRAVENOUS

## 2016-09-09 NOTE — Patient Instructions (Signed)
[facility] Discharge Instructions for Patients Receiving Chemotherapy  Today you received the following chemotherapy agents: Keytruda   To help prevent nausea and vomiting after your treatment, we encourage you to take your nausea medication as directed    If you develop nausea and vomiting that is not controlled by your nausea medication, call the clinic.   BELOW ARE SYMPTOMS THAT SHOULD BE REPORTED IMMEDIATELY:  *FEVER GREATER THAN 100.5 F  *CHILLS WITH OR WITHOUT FEVER  NAUSEA AND VOMITING THAT IS NOT CONTROLLED WITH YOUR NAUSEA MEDICATION  *UNUSUAL SHORTNESS OF BREATH  *UNUSUAL BRUISING OR BLEEDING  TENDERNESS IN MOUTH AND THROAT WITH OR WITHOUT PRESENCE OF ULCERS  *URINARY PROBLEMS  *BOWEL PROBLEMS  UNUSUAL RASH Items with * indicate a potential emergency and should be followed up as soon as possible.  Feel free to call the clinic you have any questions or concerns. The clinic phone number is [phone].  Please show the CHEMO ALERT CARD at check-in to the Emergency Department and triage nurse.   

## 2016-09-09 NOTE — Progress Notes (Signed)
Ok to treat with elevated heart rate per MD Benay Spice

## 2016-09-09 NOTE — Telephone Encounter (Signed)
Follow up appointment with Lattie Haw, scheduled for 09/23/16 as requested per 09/09/16 los. AVS report and appointment schedule given to patient, per 09/09/16 los.

## 2016-09-09 NOTE — Progress Notes (Addendum)
[facility] OFFICE PROGRESS NOTE   Diagnosis:  Esophagus cancer   INTERVAL HISTORY:   Mr. Malina returns as scheduled. He completed cycle 1 Pembrolizumab 08/23/2016. He denies nausea/vomiting. No mouth sores. No diarrhea. No rash. Appetite has improved over the past few days. Shortness of breath and cough also improved. He continues the inhaler and prednisone. He continues to have hoarseness. Shoulder pain is better.  Objective:  Vital signs in last 24 hours:  Blood pressure 120/72, pulse (!) 125, temperature 98 F (36.7 C), temperature source Oral, resp. rate 17, height _0  (1.6 m), weight 101 lb 8 oz (46 kg), SpO2 98 %.    HEENT: No thrush or ulcers. Resp: Breath sounds diminished right lung field. Inspiratory rales left lung base. Cardio: Regular, tachycardic. GI: Abdomen soft and nontender. No hepatomegaly. Vascular: No leg edema.  Skin: No rash.    Lab Results:  Lab Results  Component Value Date   WBC 6.9 09/09/2016   HGB 12.5 (L) 09/09/2016   HCT 38.0 (L) 09/09/2016   MCV 87.8 09/09/2016   PLT 255 09/09/2016   NEUTROABS 4.4 09/09/2016    Imaging:  No results found.  Medications: I have reviewed the patient's current medications.  Assessment/Plan: 1. Squamous cell carcinoma. Staging CT scans of the chest, abdomen and pelvis on 06/02/2012 negative for evidence of metastatic disease. Endoscopic ultrasound 06/11/2012 confirmed a uT3 uN1 tumor at 40 cm from the incisors. He began radiation 06/15/2012. He began weekly Taxol/carboplatin chemotherapy 06/16/2012. He completed 6 weekly chemotherapy treatments. The carboplatin was held with week 5 due to thrombocytopenia. He completed the sixth weekly treatment with Taxol and carboplatin on 07/23/2012. He completed radiation on 07/29/2012 -he underwent an esophagectomy on 08/26/2012 with the pathology confirming a ypT3,ypN0 squamous cell carcinoma with perineural invasion identified.  2. History of  solid/liquid dysphagia secondary to the obstructing esophagus mass.  3. Reflux. He is taking Protonix. 4. Hospitalization 07/19/2014 with abdominal pain and vomiting. CT scan showed a mid small bowel obstruction without discrete mass or evident etiology. Small bowel obstruction felt to be secondary to adhesions. 5. Renal failure during the 07/19/2014 hospitalization felt to be contrast-induced nephropathy. 6. Chest CT 06/15/2013 with postoperative changes from esophagectomy and gastric pull-through. 7 mm posterior subpleural nodule in the right lower lobe. Followup CT scan 12/16/2013 revealed an increase in the size of a right hilar node and right upper lobe nodule.  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  Chest CT 12/23/2014 with enlarged right hilar lymph node measuring 1.2 cm, previously 0.8 cm. Slight enlargement of a periaortic lymph node, 8 mm as compared to 7 mm on the previous study. Increased thickening of the right pleural rind. Moderate sized right pleural effusion.  Chest x-ray 11/28/2015, no change in right pleural effusion  Chest x-ray 04/18/2016 with increase in the right pleural effusion.  Thoracentesis 04/24/2016-only 25 mL of pleural fluid could be obtained  CT chest 04/24/2016-progressive mediastinal/hilar lymphadenopathy, large  right pleural effusion with mixed density, interstitial thickening in the right lower lung concerning for lymphatic tumor spread  Salvage chemotherapy with weekly Taxol/carboplatin initiated 04/30/2016  Cycle 2 weekly Taxol/carboplatin 06/11/2016(chemotherapy dose reduced due to neutropenia); week 3 held on 06/25/2016 due to neutropenia.   Schedule adjusted to every other week beginning 07/02/2016  07/02/2016 he developed nausea/vomiting, headache and facial flushing during the carboplatin infusion   Carboplatin eliminated from the regimen beginning 07/16/2016  CT chest 07/29/2016-mildly improved mediastinal lymphadenopathy,persistent right pleural effusion, possible new left lower lobe nodules  PD-L1 10%  Cycle 1 Pembrolizumab 08/23/2016  Cycle 2 Pembrolizumab 09/09/2016 7. Headaches. Negative brain CT 12/23/2014.   Disposition: Mr. Amon appears improved. The dyspnea and cough are better. Plan to proceed with cycle 2 Pembrolizumab today as scheduled. He will decrease prednisone from 10 mg daily to 5 mg daily.  The hoarseness may be due to vocal cord paralysis. We will make a referral to ENT if he still has hoarseness in 2 weeks.  He will return for a follow-up visit in 2 weeks. He will contact the office in the interim with any problems.  Patient seen with Dr. Benay Spice.    Ned Card ANP/GNP-BC   09/09/2016  2:00 PM  This was a shared visit with Ned Card. His performance status appears improved today. We tapered the prednisone and he will receive a second treatment with pembrolizumab today.  Julieanne Manson, M.D.

## 2016-09-10 ENCOUNTER — Ambulatory Visit: Payer: BLUE CROSS/BLUE SHIELD | Admitting: Nurse Practitioner

## 2016-09-10 ENCOUNTER — Ambulatory Visit: Payer: BLUE CROSS/BLUE SHIELD

## 2016-09-10 ENCOUNTER — Other Ambulatory Visit: Payer: BLUE CROSS/BLUE SHIELD

## 2016-09-16 ENCOUNTER — Other Ambulatory Visit: Payer: Self-pay | Admitting: *Deleted

## 2016-09-16 ENCOUNTER — Other Ambulatory Visit: Payer: Self-pay | Admitting: Nurse Practitioner

## 2016-09-16 DIAGNOSIS — C155 Malignant neoplasm of lower third of esophagus: Secondary | ICD-10-CM

## 2016-09-16 MED ORDER — TRAMADOL HCL 50 MG PO TABS: 50.0000 mg | ORAL_TABLET | Freq: Four times a day (QID) | ORAL | 0 refills | Status: DC | PRN

## 2016-09-17 ENCOUNTER — Other Ambulatory Visit: Payer: Self-pay | Admitting: Oncology

## 2016-09-17 DIAGNOSIS — C7800 Secondary malignant neoplasm of unspecified lung: Secondary | ICD-10-CM

## 2016-09-23 ENCOUNTER — Ambulatory Visit (HOSPITAL_BASED_OUTPATIENT_CLINIC_OR_DEPARTMENT_OTHER): Payer: BLUE CROSS/BLUE SHIELD | Admitting: Nurse Practitioner

## 2016-09-23 DIAGNOSIS — J910 Malignant pleural effusion: Secondary | ICD-10-CM | POA: Diagnosis not present

## 2016-09-23 DIAGNOSIS — C155 Malignant neoplasm of lower third of esophagus: Secondary | ICD-10-CM

## 2016-09-23 DIAGNOSIS — C7801 Secondary malignant neoplasm of right lung: Secondary | ICD-10-CM

## 2016-09-23 DIAGNOSIS — R51 Headache: Secondary | ICD-10-CM

## 2016-09-23 DIAGNOSIS — C7800 Secondary malignant neoplasm of unspecified lung: Secondary | ICD-10-CM

## 2016-09-23 MED ORDER — TEMAZEPAM 15 MG PO CAPS: 15.0000 mg | ORAL_CAPSULE | Freq: Every evening | ORAL | 0 refills | Status: DC | PRN

## 2016-09-23 NOTE — Progress Notes (Addendum)
[facility] OFFICE PROGRESS NOTE   Diagnosis:  Esophagus cancer  INTERVAL HISTORY:   Bruce [name] returns as scheduled. He completed cycle 2 Pembrolizumab 09/09/2016. He denies nausea/vomiting. No diarrhea. No rash. Shortness of breath and cough continue to be improved. He discontinued prednisone 5 mg daily about 1 week ago. Hoarseness is better. Main complaint today is a 1 week history of intermittent frontal headaches. No diplopia. No nausea or vomiting. No focal extremity weakness. He does note dizziness with the headaches. Headaches tend to last 5-30 minutes. He has tried Tylenol, ibuprofen and tramadol with no relief.  Objective:  Vital signs in last 24 hours:  Blood pressure 120/80, pulse (!) 126, temperature 97.5 F (36.4 C), temperature source Oral, resp. rate 17, height _0  (1.6 m), weight 99 lb (44.9 kg), SpO2 98 %.    HEENT: PERRLA. No thrush or ulcers. Lymph: Bilateral low neck/scalene adenopathy. Resp: Lungs are clear. Breath sounds diminished throughout the right lung field. No respiratory distress. Cardio: Regular, tachycardic. GI: No hepatomegaly. Vascular: No leg edema. Neuro: Motor strength 5 over 5. Finger to nose intact. Gait normal. Skin: No rash.    Lab Results:  Lab Results  Component Value Date   WBC 6.9 09/09/2016   HGB 12.5 (L) 09/09/2016   HCT 38.0 (L) 09/09/2016   MCV 87.8 09/09/2016   PLT 255 09/09/2016   NEUTROABS 4.4 09/09/2016    Imaging:  No results found.  Medications: I have reviewed the patient's current medications.  Assessment/Plan: 1. Squamous cell carcinoma. Staging CT scans of the chest, abdomen and pelvis on 06/02/2012 negative for evidence of metastatic disease. Endoscopic ultrasound 06/11/2012 confirmed a uT3 uN1 tumor at 40 cm from the incisors. He began radiation 06/15/2012. He began weekly Taxol/carboplatin chemotherapy 06/16/2012. He completed 6 weekly chemotherapy treatments. The carboplatin was held with  week 5 due to thrombocytopenia. He completed the sixth weekly treatment with Taxol and carboplatin on 07/23/2012. He completed radiation on 07/29/2012 -he underwent an esophagectomy on 08/26/2012 with the pathology confirming a ypT3,ypN0 squamous cell carcinoma with perineural invasion identified.  2. History of solid/liquid dysphagia secondary to the obstructing esophagus mass.  3. Reflux. He is taking Protonix. 4. Hospitalization 07/19/2014 with abdominal pain and vomiting. CT scan showed a mid small bowel obstruction without discrete mass or evident etiology. Small bowel obstruction felt to be secondary to adhesions. 5. Renal failure during the 07/19/2014 hospitalization felt to be contrast-induced nephropathy. 6. Chest CT 06/15/2013 with postoperative changes from esophagectomy and gastric pull-through. 7 mm posterior subpleural nodule in the right lower lobe. Followup CT scan 12/16/2013 revealed an increase in the size of a right hilar node and right upper lobe nodule.  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  Chest CT 12/23/2014 with enlarged right hilar lymph node measuring 1.2 cm, previously 0.8 cm. Slight enlargement of a periaortic lymph node, 8 mm as compared to 7 mm on  the previous study. Increased thickening of the right pleural rind. Moderate  sized right pleural effusion.  Chest x-ray 11/28/2015, no change in right pleural effusion  Chest x-ray 04/18/2016 with increase in the right pleural effusion.  Thoracentesis 04/24/2016-only 25 mL of pleural fluid could be obtained  CT chest 04/24/2016-progressive mediastinal/hilar lymphadenopathy, large right pleural effusion with mixed density, interstitial thickening in the right lower lung concerning for lymphatic tumor spread  Salvage chemotherapy with weekly Taxol/carboplatin initiated 04/30/2016  Cycle 2 weekly Taxol/carboplatin 06/11/2016(chemotherapy dose reduced due to neutropenia); week 3 held on 06/25/2016 due to neutropenia.   Schedule adjusted to every other week beginning 07/02/2016  07/02/2016 he developed nausea/vomiting, headache and facial flushing during the carboplatin infusion   Carboplatin eliminated from the regimen beginning 07/16/2016  CT chest 07/29/2016-mildly improved mediastinal lymphadenopathy,persistent right pleural effusion, possible new left lower lobe nodules  PD-L1 10%  Cycle 1 Pembrolizumab 08/23/2016  Cycle 2 Pembrolizumab 09/09/2016 7. Headaches. Negative brain CT 12/23/2014.   Disposition: [name] appears stable. He has completed 2 cycles of Pembrolizumab. Dyspnea and cough are better. Hoarseness has also improved.  Etiology of the headaches is unclear. He will try liquid morphine. He and his daughter understand to contact the office if the headaches persist or he develops any new concerning symptoms.  He will return for a follow-up visit and cycle 3 Pembrolizumab on 10/01/2016.  Patient seen with Dr. Benay Spice.    Ned Card ANP/GNP-BC   09/23/2016  2:31 PM  This was a shared visit with Ned Card. The etiology of the headache is unclear. He has experienced headaches in the past. I have a low clinical suspicion for brain or skull-based metastases. He will try low-dose morphine. He will return for an office visit  prior to the next cycle of pembrolizumab.  Julieanne Manson, M.D.

## 2016-09-25 ENCOUNTER — Other Ambulatory Visit: Payer: Self-pay | Admitting: *Deleted

## 2016-09-25 ENCOUNTER — Telehealth: Payer: Self-pay | Admitting: *Deleted

## 2016-09-25 DIAGNOSIS — C155 Malignant neoplasm of lower third of esophagus: Secondary | ICD-10-CM

## 2016-09-25 DIAGNOSIS — C7800 Secondary malignant neoplasm of unspecified lung: Secondary | ICD-10-CM

## 2016-09-25 MED ORDER — LORAZEPAM 0.5 MG PO TABS: 0.5000 mg | ORAL_TABLET | Freq: Every evening | ORAL | 0 refills | Status: DC | PRN

## 2016-09-25 NOTE — Telephone Encounter (Signed)
Call received from patient's daughter Lonna Duval requesting a refill of Lorazepam for patient for sleep. Daughter notified that refill called in to [facility] on [address].

## 2016-09-29 ENCOUNTER — Other Ambulatory Visit: Payer: Self-pay | Admitting: Oncology

## 2016-10-01 ENCOUNTER — Ambulatory Visit (HOSPITAL_BASED_OUTPATIENT_CLINIC_OR_DEPARTMENT_OTHER): Payer: BLUE CROSS/BLUE SHIELD | Admitting: Oncology

## 2016-10-01 ENCOUNTER — Ambulatory Visit (HOSPITAL_BASED_OUTPATIENT_CLINIC_OR_DEPARTMENT_OTHER): Payer: BLUE CROSS/BLUE SHIELD

## 2016-10-01 ENCOUNTER — Telehealth: Payer: Self-pay | Admitting: *Deleted

## 2016-10-01 ENCOUNTER — Other Ambulatory Visit (HOSPITAL_BASED_OUTPATIENT_CLINIC_OR_DEPARTMENT_OTHER): Payer: BLUE CROSS/BLUE SHIELD

## 2016-10-01 ENCOUNTER — Telehealth: Payer: Self-pay | Admitting: Oncology

## 2016-10-01 VITALS — HR 118

## 2016-10-01 VITALS — BP 107/78 | HR 123 | Temp 98.0°F | Resp 16 | Ht 63.0 in | Wt 97.5 lb

## 2016-10-01 DIAGNOSIS — M542 Cervicalgia: Secondary | ICD-10-CM

## 2016-10-01 DIAGNOSIS — Z5112 Encounter for antineoplastic immunotherapy: Secondary | ICD-10-CM | POA: Diagnosis not present

## 2016-10-01 DIAGNOSIS — M545 Low back pain: Secondary | ICD-10-CM

## 2016-10-01 DIAGNOSIS — J910 Malignant pleural effusion: Secondary | ICD-10-CM | POA: Diagnosis not present

## 2016-10-01 DIAGNOSIS — C7801 Secondary malignant neoplasm of right lung: Secondary | ICD-10-CM

## 2016-10-01 DIAGNOSIS — C7800 Secondary malignant neoplasm of unspecified lung: Secondary | ICD-10-CM

## 2016-10-01 DIAGNOSIS — C155 Malignant neoplasm of lower third of esophagus: Secondary | ICD-10-CM

## 2016-10-01 DIAGNOSIS — G4700 Insomnia, unspecified: Secondary | ICD-10-CM

## 2016-10-01 DIAGNOSIS — Z79899 Other long term (current) drug therapy: Secondary | ICD-10-CM | POA: Diagnosis not present

## 2016-10-01 LAB — COMPREHENSIVE METABOLIC PANEL WITH GFR
ALT: 25 U/L (ref 0–55)
AST: 26 U/L (ref 5–34)
Albumin: 3.1 g/dL — ABNORMAL LOW (ref 3.5–5.0)
Alkaline Phosphatase: 96 U/L (ref 40–150)
Anion Gap: 10 meq/L (ref 3–11)
BUN: 13.4 mg/dL (ref 7.0–26.0)
CO2: 20 meq/L — ABNORMAL LOW (ref 22–29)
Calcium: 9.7 mg/dL (ref 8.4–10.4)
Chloride: 99 meq/L (ref 98–109)
Creatinine: 0.8 mg/dL (ref 0.7–1.3)
EGFR: >90 ml/min/1.73 m2
Glucose: 142 mg/dL — ABNORMAL HIGH (ref 70–140)
Potassium: 3.5 meq/L (ref 3.5–5.1)
Sodium: 129 meq/L — ABNORMAL LOW (ref 136–145)
Total Bilirubin: 0.53 mg/dL (ref 0.20–1.20)
Total Protein: 9.1 g/dL — ABNORMAL HIGH (ref 6.4–8.3)

## 2016-10-01 LAB — CBC WITH DIFFERENTIAL/PLATELET
BASO%: 0.6 % (ref 0.0–2.0)
Basophils Absolute: 0 10*3/uL (ref 0.0–0.1)
EOS%: 1.5 % (ref 0.0–7.0)
Eosinophils Absolute: 0.1 10*3/uL (ref 0.0–0.5)
HCT: 37.1 % — ABNORMAL LOW (ref 38.4–49.9)
HGB: 12.4 g/dL — ABNORMAL LOW (ref 13.0–17.1)
LYMPH%: 26.8 % (ref 14.0–49.0)
MCH: 28.1 pg (ref 27.2–33.4)
MCHC: 33.5 g/dL (ref 32.0–36.0)
MCV: 84 fL (ref 79.3–98.0)
MONO#: 0.7 10*3/uL (ref 0.1–0.9)
MONO%: 10.4 % (ref 0.0–14.0)
NEUT#: 3.9 10*3/uL (ref 1.5–6.5)
NEUT%: 60.7 % (ref 39.0–75.0)
Platelets: 227 10*3/uL (ref 140–400)
RBC: 4.42 10*6/uL (ref 4.20–5.82)
RDW: 14.8 % — ABNORMAL HIGH (ref 11.0–14.6)
WBC: 6.4 10*3/uL (ref 4.0–10.3)
lymph#: 1.7 10*3/uL (ref 0.9–3.3)

## 2016-10-01 LAB — TSH: TSH: 1.576 m(IU)/L (ref 0.320–4.118)

## 2016-10-01 MED ORDER — SODIUM CHLORIDE 0.9 % IV SOLN
200.0000 mg | Freq: Once | INTRAVENOUS | Status: AC
  Administered 2016-10-01: 200 mg via INTRAVENOUS
  Filled 2016-10-01: qty 8

## 2016-10-01 MED ORDER — SODIUM CHLORIDE 0.9 % IV SOLN
Freq: Once | INTRAVENOUS | Status: AC
  Administered 2016-10-01: 11:00:00 via INTRAVENOUS

## 2016-10-01 NOTE — Progress Notes (Signed)
Hr 118, Sodium 129 Dr. Benay Spice aware and okay to treat per Dr. Benay Spice.

## 2016-10-01 NOTE — Patient Instructions (Signed)
[facility] Discharge Instructions for Patients Receiving Chemotherapy  Today you received the following chemotherapy agents: Keytruda   To help prevent nausea and vomiting after your treatment, we encourage you to take your nausea medication as directed    If you develop nausea and vomiting that is not controlled by your nausea medication, call the clinic.   BELOW ARE SYMPTOMS THAT SHOULD BE REPORTED IMMEDIATELY:  *FEVER GREATER THAN 100.5 F  *CHILLS WITH OR WITHOUT FEVER  NAUSEA AND VOMITING THAT IS NOT CONTROLLED WITH YOUR NAUSEA MEDICATION  *UNUSUAL SHORTNESS OF BREATH  *UNUSUAL BRUISING OR BLEEDING  TENDERNESS IN MOUTH AND THROAT WITH OR WITHOUT PRESENCE OF ULCERS  *URINARY PROBLEMS  *BOWEL PROBLEMS  UNUSUAL RASH Items with * indicate a potential emergency and should be followed up as soon as possible.  Feel free to call the clinic you have any questions or concerns. The clinic phone number is [phone].  Please show the CHEMO ALERT CARD at check-in to the Emergency Department and triage nurse.   

## 2016-10-01 NOTE — Telephone Encounter (Signed)
Per LOS I have scheduled appt and notified the scheduler

## 2016-10-01 NOTE — Telephone Encounter (Signed)
Message sent to chemo scheduler to be added.  Appointments scheduled per 10/01/16 los.  Copy of  AVS report and appointment schedule was given to patient, per 10/01/16 los.

## 2016-10-01 NOTE — Progress Notes (Signed)
[place] OFFICE PROGRESS NOTE   Diagnosis: Esophagus cancer  INTERVAL HISTORY:   Bruce [name] returns as scheduled. He is with his daughter. She reports his headache and cough have improved. He continues to have pain at the bilateral upper back and low neck. He has insomnia.  Objective:  Vital signs in last 24 hours:  Blood pressure 107/78, pulse (!) 123, temperature 98 F (36.7 C), temperature source Oral, resp. rate 16, height 5' 3"  (1.6 m), weight 97 lb 8 oz (44.2 kg), SpO2 99 %.    HEENT: Fullness in the left supraclavicular fossa. No neck tenderness. Lymphatics: Firm lymph nodes in the right low neck Resp: Decreased breath sounds at the right posterior chest, no respiratory distress Cardio: Regular rate and rhythm, tachycardia GI: No hepatomegaly, nontender Vascular: No leg edema   Lab Results:  Lab Results  Component Value Date   WBC 6.4 10/01/2016   HGB 12.4 (L) 10/01/2016   HCT 37.1 (L) 10/01/2016   MCV 84.0 10/01/2016   PLT 227 10/01/2016   NEUTROABS 3.9 10/01/2016     Medications: I have reviewed the patient's current medications.  Assessment/Plan: 1. Squamous cell carcinoma. Staging CT scans of the chest, abdomen and pelvis on 06/02/2012 negative for evidence of metastatic disease. Endoscopic ultrasound 06/11/2012 confirmed a uT3 uN1 tumor at 40 cm from the incisors. He began radiation 06/15/2012. He began weekly Taxol/carboplatin chemotherapy 06/16/2012. He completed 6 weekly chemotherapy treatments. The carboplatin was held with week 5 due to thrombocytopenia. He completed the sixth weekly treatment with Taxol and carboplatin on 07/23/2012. He completed radiation on 07/29/2012 -he underwent an esophagectomy on 08/26/2012 with the pathology confirming a ypT3,ypN0 squamous cell carcinoma with perineural invasion identified.  2. History of solid/liquid dysphagia secondary to the obstructing esophagus mass.  3. Reflux. He is taking  Protonix. 4. Hospitalization 07/19/2014 with abdominal pain and vomiting. CT scan showed a mid small bowel obstruction without discrete mass or evident etiology. Small bowel obstruction felt to be secondary to adhesions. 5. Renal failure during the 07/19/2014 hospitalization felt to be contrast-induced nephropathy. 6. Chest CT 06/15/2013 with postoperative changes from esophagectomy and gastric pull-through. 7 mm posterior subpleural nodule in the right lower lobe. Followup CT scan 12/16/2013 revealed an increase in the size of a right hilar node and right upper lobe nodule.  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  Chest CT 12/23/2014 with enlarged right hilar lymph node measuring 1.2 cm, previously 0.8 cm. Slight enlargement of a periaortic lymph node, 8 mm as compared to 7 mm on the previous study. Increased thickening of the right pleural rind. Moderate sized right pleural effusion.  Chest x-ray 11/28/2015, no change in right pleural effusion  Chest x-ray 04/18/2016 with increase in the right pleural effusion.  Thoracentesis 04/24/2016-only 25 mL of pleural fluid could be obtained  CT chest 04/24/2016-progressive mediastinal/hilar lymphadenopathy, large right pleural effusion with mixed density, interstitial thickening  in the right lower lung  concerning for lymphatic tumor spread  Salvage chemotherapy with weekly Taxol/carboplatin initiated 04/30/2016  Cycle 2 weekly Taxol/carboplatin 06/11/2016(chemotherapy dose reduced due to neutropenia); week 3 held on 06/25/2016 due to neutropenia.   Schedule adjusted to every other week beginning 07/02/2016  07/02/2016 he developed nausea/vomiting, headache and facial flushing during the carboplatin infusion   Carboplatin eliminated from the regimen beginning 07/16/2016  CT chest 07/29/2016-mildly improved mediastinal lymphadenopathy,persistent right pleural effusion, possible new left lower lobe nodules  PD-L1 10%  Cycle 1 Pembrolizumab 08/23/2016  Cycle 2 Pembrolizumab 09/09/2016  Cycle 3 Pembrolizumab 10/01/2016 7. Headaches. Negative brain CT 12/23/2014.     Disposition: His overall status appears unchanged. The plan is to proceed with cycle 3. Pembrolizumab today. He will receive an influenza vaccine today. Bruce [name] will return for an office visit in 3 weeks. He will try a 1 mg dose of Ativan for insomnia.    Betsy Coder, MD  10/01/2016  9:33 AM

## 2016-10-15 ENCOUNTER — Telehealth: Payer: Self-pay | Admitting: Pharmacist

## 2016-10-15 NOTE — Telephone Encounter (Signed)
Received call from [place] at [facility]. Another supply of Keytruda x 2 vials will be shipped tomorrow (10/16/16). Kennith Center, Pharm.D., CPP 10/15/2016'@2'$ :34 PM

## 2016-10-20 ENCOUNTER — Other Ambulatory Visit: Payer: Self-pay | Admitting: Oncology

## 2016-10-22 ENCOUNTER — Telehealth: Payer: Self-pay | Admitting: *Deleted

## 2016-10-22 ENCOUNTER — Telehealth: Payer: Self-pay | Admitting: Nurse Practitioner

## 2016-10-22 ENCOUNTER — Ambulatory Visit (HOSPITAL_BASED_OUTPATIENT_CLINIC_OR_DEPARTMENT_OTHER): Payer: BLUE CROSS/BLUE SHIELD

## 2016-10-22 ENCOUNTER — Other Ambulatory Visit (HOSPITAL_BASED_OUTPATIENT_CLINIC_OR_DEPARTMENT_OTHER): Payer: BLUE CROSS/BLUE SHIELD

## 2016-10-22 ENCOUNTER — Telehealth: Payer: Self-pay

## 2016-10-22 ENCOUNTER — Other Ambulatory Visit: Payer: Self-pay

## 2016-10-22 ENCOUNTER — Ambulatory Visit (HOSPITAL_BASED_OUTPATIENT_CLINIC_OR_DEPARTMENT_OTHER): Payer: BLUE CROSS/BLUE SHIELD | Admitting: Nurse Practitioner

## 2016-10-22 DIAGNOSIS — C155 Malignant neoplasm of lower third of esophagus: Secondary | ICD-10-CM

## 2016-10-22 DIAGNOSIS — C7800 Secondary malignant neoplasm of unspecified lung: Secondary | ICD-10-CM

## 2016-10-22 DIAGNOSIS — J910 Malignant pleural effusion: Secondary | ICD-10-CM

## 2016-10-22 DIAGNOSIS — C7801 Secondary malignant neoplasm of right lung: Secondary | ICD-10-CM

## 2016-10-22 DIAGNOSIS — M25519 Pain in unspecified shoulder: Secondary | ICD-10-CM

## 2016-10-22 DIAGNOSIS — R51 Headache: Secondary | ICD-10-CM

## 2016-10-22 DIAGNOSIS — Z5112 Encounter for antineoplastic immunotherapy: Secondary | ICD-10-CM

## 2016-10-22 DIAGNOSIS — R490 Dysphonia: Secondary | ICD-10-CM

## 2016-10-22 LAB — COMPREHENSIVE METABOLIC PANEL
ALT: 28 U/L (ref 0–55)
AST: 26 U/L (ref 5–34)
Albumin: 2.9 g/dL — ABNORMAL LOW (ref 3.5–5.0)
Alkaline Phosphatase: 90 U/L (ref 40–150)
Anion Gap: 8 mEq/L (ref 3–11)
BUN: 11.4 mg/dL (ref 7.0–26.0)
CHLORIDE: 97 meq/L — AB (ref 98–109)
CO2: 23 meq/L (ref 22–29)
CREATININE: 0.8 mg/dL (ref 0.7–1.3)
Calcium: 9.4 mg/dL (ref 8.4–10.4)
EGFR: >90 mL/min/{1.73_m2} (ref >90)
GLUCOSE: 141 mg/dL — AB (ref 70–140)
Potassium: 3.6 mEq/L (ref 3.5–5.1)
Sodium: 129 mEq/L — ABNORMAL LOW (ref 136–145)
TOTAL PROTEIN: 8.6 g/dL — AB (ref 6.4–8.3)
Total Bilirubin: 0.6 mg/dL (ref 0.20–1.20)

## 2016-10-22 LAB — CBC WITH DIFFERENTIAL/PLATELET
BASO%: 0.6 % (ref 0.0–2.0)
Basophils Absolute: 0 10*3/uL (ref 0.0–0.1)
EOS%: 0.5 % (ref 0.0–7.0)
Eosinophils Absolute: 0 10*3/uL (ref 0.0–0.5)
HCT: 37.5 % — ABNORMAL LOW (ref 38.4–49.9)
HGB: 12.3 g/dL — ABNORMAL LOW (ref 13.0–17.1)
LYMPH#: 1.5 10*3/uL (ref 0.9–3.3)
LYMPH%: 21.6 % (ref 14.0–49.0)
MCH: 27.6 pg (ref 27.2–33.4)
MCHC: 32.9 g/dL (ref 32.0–36.0)
MCV: 84 fL (ref 79.3–98.0)
MONO#: 0.6 10*3/uL (ref 0.1–0.9)
MONO%: 8.3 % (ref 0.0–14.0)
NEUT%: 69 % (ref 39.0–75.0)
NEUTROS ABS: 4.8 10*3/uL (ref 1.5–6.5)
Platelets: 245 10*3/uL (ref 140–400)
RBC: 4.46 10*6/uL (ref 4.20–5.82)
RDW: 14.2 % (ref 11.0–14.6)
WBC: 7 10*3/uL (ref 4.0–10.3)

## 2016-10-22 MED ORDER — SODIUM CHLORIDE 0.9 % IV SOLN
Freq: Once | INTRAVENOUS | Status: AC
  Administered 2016-10-22: 11:00:00 via INTRAVENOUS

## 2016-10-22 MED ORDER — SODIUM CHLORIDE 0.9 % IV SOLN
200.0000 mg | Freq: Once | INTRAVENOUS | Status: AC
  Administered 2016-10-22: 200 mg via INTRAVENOUS
  Filled 2016-10-22: qty 8

## 2016-10-22 MED ORDER — LORAZEPAM 0.5 MG PO TABS: 0.5000 mg | ORAL_TABLET | Freq: Every evening | ORAL | 0 refills | Status: DC | PRN

## 2016-10-22 MED ORDER — ALBUTEROL SULFATE HFA 108 (90 BASE) MCG/ACT IN AERS: 2.0000 | INHALATION_SPRAY | Freq: Four times a day (QID) | RESPIRATORY_TRACT | 1 refills | Status: AC | PRN

## 2016-10-22 MED ORDER — TRAMADOL HCL 50 MG PO TABS: 50.0000 mg | ORAL_TABLET | Freq: Four times a day (QID) | ORAL | 0 refills | Status: DC | PRN

## 2016-10-22 NOTE — Telephone Encounter (Signed)
Per LOS I have scheduled appts and notified the scheduler 

## 2016-10-22 NOTE — Telephone Encounter (Signed)
Appointments scheduled per 12/5 LOS. Patient given AVS report and calendars with future scheduled appointments. Two bottles of contrast and instructions given to patient.

## 2016-10-22 NOTE — Patient Instructions (Signed)
Pembrolizumab injection ?y l thu?c g? PEMBROLIZUMAB l m?t khng th? ??n dng. Thu?c ???c dng ?? ?i?u tr? b??u h?c t?, ung th? ??u v c?, v ung th? ph?i khng ph?i t? bo nh?. (CC) NHN HI?U PH? BI?N: [employer] Ti c?n ph?i bo cho ng??i cung c?p d?ch v? y t? c?a mnh ?i?u g tr??c khi dng thu?c ny? H? c?n bi?t li?u qu v? c b?t k? tnh tr?ng no sau ?y khng: -b?nh ti?u ???ng -cc v?n ?? v? h? mi?n d?ch -b?nh vim ru?t -b?nh gan -b?nh ph?i ho?c h h?p -b?nh lupus -pha?n ??ng b?t th???ng ho??c di? ??ng v??i pembrolizumab -pha?n ??ng b?t th???ng ho??c di? ??ng v??i ca?c d??c ph?m kha?c -pha?n ??ng b?t th???ng ho??c di? ??ng v??i th??c ph?m, thu?c nhu?m, ho??c ch?t ba?o qua?n -?ang c thai ho??c ??nh co? thai -?ang cho con bu? Ti nn s? d?ng thu?c ny nh? th? no? Thu?c ny ?? truy?n vo t?nh m?ch. Thu?c ny ???c s? d?ng b?i chuyn vin y t? ? b?nh vi?n ho?c ? phng m?ch. Qu v? s? ???c nh?n m?t B?n H??ng D?n v? D??c Ph?m (MedGuide) tr??c m?i l?n ?i?u tr?. Hy b?o ??m ??c k? thng tin ny m?i l?n. Hy bn v?i bc s? nhi khoa c?a qu v? v? vi?c dng thu?c ny ? tr? em. C th? c?n ch?m [place] ??c bi?t. N?u ti l? qun m?t li?u th sao? ?i?u quan tr?ng l khng nn b? l? li?u thu?c no. Hy lin l?c v?i bc s? ho?c [place] vin y t? c?a mnh, n?u qu v? khng th? gi? ?ng cu?c h?n khm. Nh?ng g c th? t??ng tc v?i thu?c ny? Ch?a co? nghin c??u v? t??ng ta?c thu?c. Hy ??a cho ba?c si? ho??c chuyn vin y t? c?a mnh danh sch t?t c? cc thu?c, th?o d??c, cc thu?c khng c?n toa, ho?c cc ch? ph?m b? sung ti?t th?c m qu v? dng. C?ng nn bo cho h? bi?t r?ng qu v? c ht thu?c, u?ng r??u, ho?c c s? d?ng ma ty tri php hay khng. Vi th? c th? t??ng tc v?i thu?c na?y. Ti c?n ph?i theo di ?i?u g trong khi dng thu?c ny? Qu v? s? ???c theo di ch?t ch? trong khi dng thu?c ny. Qu v? s? c?n ?i lm cc xt nghi?m mu ??nh k? trong khi qu v? dng thu?c ny. Khng  ???c c thai trong th?i gian dng thu?c ny ho?c trong vng 4 thng sau khi ng?ng dng thu?c. Ph? n? c?n ph?i thng bo cho bc s? c?a mnh, n?u mu?n c thai ho?c ngh? r?ng c th? mnh ? c [place]. C nguy c? v? cc tc d?ng ph? nghim tr?ng ??i v?i [place] nhi. Hy th?o lu?n v?i bc s? ho?c chuyn vin y t? ho?c d??c s? ?? bi?t thm thng tin. Khng ???c nui con b?ng s?a m? trong khi dng thu?c ny ho?c trong 4 thng sau li?u thu?c cng. Ti c th? nh?n th?y nh?ng tc d?ng ph? no khi dng thu?c ny? Nh?ng tc d?ng ph? qu v? c?n ph?i bo cho bc s? ho?c chuyn vin y t? cng s?m cng t?t: -cc ph?n ?ng d? ?ng, ch?ng h?n nh? da b? m?n ??, ng?a, n?i my ?ay, s?ng ? m?t, mi, ho?c l??i -phn c mu ho?c c mu ?en, mu ?c n -kh th? -thay ??i l??ng n??c ti?u ???c bi ti?t -thay ??i th? l?c -?au ng?c -?n l?nh -n??c ti?u s?m mu -chng m?t ho?c c?m th?y chong  vng ho?c ng?t x?u -tim ??p nhanh ho?c khng ??u -s?t -?? b?ng da -r?ng tc -?au ho?c nh?c c? b?p -y?u c? b?p -?au ??u dai d?ng -cc d?u hi?u v tri?u ch?ng ???ng huy?t cao nh? chng m?t; kh mi?ng; kh da; h?i th? c mi tri cy; bu?n i; ?au bao t?; ?i ho?t kht h?n; ?i ti?u nhi?u h?n -cc d?u hi?u v tri?u ch?ng t?n th??ng gan, ch?ng h?n nh? n??c ti?u s?m mu; phn b?c mu; m?t c?m gic ngon mi?ng; bu?n i; ?au vng b?ng trn; vng da ho?c m?t -?au b?ng -gi?m cn Cc tc d?ng ph? khng c?n ph?i ch?m [place] y t? (hy bo cho bc s? ho?c chuyn vin y t?, n?u cc tc d?ng ph? ny ti?p di?n ho?c gy phi?n toi): to bn -ho -tiu ch?y -?au ho?c nh?c kh?p -c?m th?y m?t m?i Ti nn c?t gi? thu?c c?a mnh ? ?u? Thu?c ny ???c s? d?ng b?i chuyn vin y t? ? b?nh vi?n ho?c ? phng m?ch. Qu v? s? khng ???c c?p thu?c ny ?? c?t gi? t?i nh.  2017 Elsevier/Gold Standard (2015-10-31 00:00:00)

## 2016-10-22 NOTE — Telephone Encounter (Signed)
Called Dr. Everrett Coombe office and informed them patient appears to have retained suture in the midline abdominal scar per NP. Informed her pt is having discomfort with and and is requested pt be seen. Denice Paradise states she will call patient to get an appt made.

## 2016-10-22 NOTE — Progress Notes (Signed)
[facility] OFFICE PROGRESS NOTE   Diagnosis:  Esophagus cancer  INTERVAL HISTORY:   Bruce [name] returns as scheduled. He completed cycle 3 Pembrolizumab 10/01/2016. He denies nausea/vomiting. No mouth sores. No diarrhea. No rash. Cough continues to be improved. He has persistent hoarseness. He continues to have intermittent headaches and shoulder pain. He takes tramadol as needed. He is losing weight.  Objective:  Vital signs in last 24 hours:  Blood pressure (!) 105/52, pulse (!) 124, temperature 97.7 F (36.5 C), temperature source Oral, resp. rate 18, height 5' 3"  (1.6 m), weight 92 lb 11.2 oz (42 kg), SpO2 98 %.    HEENT: No thrush or ulcers. Lymphatics: Firm fullness left supra clavicular fossa. Firm lymph nodes right low neck. Resp: Decreased breath sounds right lung field. No respiratory distress. Cardio: Regular, tachycardic. GI: No hepatomegaly. Nontender. Abdominal incision with a retained suture. Vascular: No leg edema. Skin: No rash.    Lab Results:  Lab Results  Component Value Date   WBC 7.0 10/22/2016   HGB 12.3 (L) 10/22/2016   HCT 37.5 (L) 10/22/2016   MCV 84.0 10/22/2016   PLT 245 10/22/2016   NEUTROABS 4.8 10/22/2016    Imaging:  No results found.  Medications: I have reviewed the patient's current medications.  Assessment/Plan: 1. Squamous cell carcinoma. Staging CT scans of the chest, abdomen and pelvis on 06/02/2012 negative for evidence of metastatic disease. Endoscopic ultrasound 06/11/2012 confirmed a uT3 uN1 tumor at 40 cm from the incisors. He began radiation 06/15/2012. He began weekly Taxol/carboplatin chemotherapy 06/16/2012. He completed 6 weekly chemotherapy treatments. The carboplatin was held with week 5 due to thrombocytopenia. He completed the sixth weekly treatment with Taxol and carboplatin on 07/23/2012. He completed radiation on 07/29/2012 -he underwent an esophagectomy on 08/26/2012 with the pathology confirming a  ypT3,ypN0 squamous cell carcinoma with perineural invasion identified.  2. History of solid/liquid dysphagia secondary to the obstructing esophagus mass.  3. Reflux. He is taking Protonix. 4. Hospitalization 07/19/2014 with abdominal pain and vomiting. CT scan showed a mid small bowel obstruction without discrete mass or evident etiology. Small bowel obstruction felt to be secondary to adhesions. 5. Renal failure during the 07/19/2014 hospitalization felt to be contrast-induced nephropathy. 6. Chest CT 06/15/2013 with postoperative changes from esophagectomy and gastric pull-through. 7 mm posterior subpleural nodule in the right lower lobe. Followup CT scan 12/16/2013 revealed an increase in the size of a right hilar node and right upper lobe nodule.  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  Chest CT 12/23/2014 with enlarged right hilar lymph node measuring 1.2 cm, previously 0.8 cm. Slight enlargement of a periaortic lymph node, 8 mm as compared to 7 mm on the previous study. Increased thickening of the right pleural rind. Moderate sized right pleural effusion.  Chest x-ray 11/28/2015, no change in right pleural effusion  Chest x-ray 04/18/2016 with increase in the right pleural effusion.  Thoracentesis 04/24/2016-only 25 mL of pleural fluid could  be obtained  CT chest 04/24/2016-progressive mediastinal/hilar lymphadenopathy, large right pleural effusion with mixed density, interstitial thickening in the right lower lung concerning for lymphatic tumor spread  Salvage chemotherapy with weekly Taxol/carboplatin initiated 04/30/2016  Cycle 2 weekly Taxol/carboplatin 06/11/2016(chemotherapy dose reduced due to neutropenia); week 3 held on 06/25/2016 due to neutropenia.   Schedule adjusted to every other week beginning 07/02/2016  07/02/2016 he developed nausea/vomiting, headache and facial flushing during the carboplatin infusion   Carboplatin eliminated from the regimen beginning 07/16/2016  CT chest 07/29/2016-mildly improved mediastinal lymphadenopathy,persistent right pleural effusion, possible new left lower lobe nodules  PD-L1 10%  Cycle 1 Pembrolizumab 08/23/2016  Cycle 2 Pembrolizumab 09/09/2016  Cycle 3 Pembrolizumab 10/01/2016  Cycle 4 Pembrolizumab 10/22/2016 7. Headaches. Negative brain CT 12/23/2014.   Disposition: Bruce [name] appears unchanged. He has completed 3 cycles of Pembrolizumab. Plan to proceed with cycle 4 today as scheduled. We are referring him for a restaging chest CT 11/08/2016. He will return for a follow-up visit on 11/12/2016. He will contact the office in the interim with any problems.  He appears to have a retained suture in the midline abdominal scar which is causing discomfort. We will contact Dr. Everrett Coombe office.  Plan reviewed with Dr. Benay Spice.   Ned Card ANP/GNP-BC   10/22/2016  9:56 AM

## 2016-10-23 ENCOUNTER — Ambulatory Visit: Payer: BLUE CROSS/BLUE SHIELD

## 2016-10-24 ENCOUNTER — Other Ambulatory Visit: Payer: Self-pay | Admitting: *Deleted

## 2016-10-24 ENCOUNTER — Ambulatory Visit (INDEPENDENT_AMBULATORY_CARE_PROVIDER_SITE_OTHER): Payer: BLUE CROSS/BLUE SHIELD | Admitting: *Deleted

## 2016-10-24 DIAGNOSIS — Z4802 Encounter for removal of sutures: Secondary | ICD-10-CM | POA: Diagnosis not present

## 2016-10-24 DIAGNOSIS — Z9889 Other specified postprocedural states: Secondary | ICD-10-CM

## 2016-10-24 DIAGNOSIS — Z9049 Acquired absence of other specified parts of digestive tract: Secondary | ICD-10-CM

## 2016-10-24 DIAGNOSIS — C155 Malignant neoplasm of lower third of esophagus: Secondary | ICD-10-CM

## 2016-10-24 DIAGNOSIS — C7800 Secondary malignant neoplasm of unspecified lung: Secondary | ICD-10-CM

## 2016-10-24 MED ORDER — TEMAZEPAM 15 MG PO CAPS: 15.0000 mg | ORAL_CAPSULE | Freq: Every evening | ORAL | 1 refills | Status: DC | PRN

## 2016-10-24 NOTE — Progress Notes (Signed)
[name] returns to the office with c/o a suture that has eroded to the outside of his well healed abdominal incision s/p esophagectomy 08/26/12. He is undergoing chemotherapy and has lost significant weight and now only weighs 93 lbs. The exposed suture is causing discomfort for him. Dr. Servando Snare explained that he can remove the suture to [name] through his daughter who interpreted for him. The area was prepped with a Betadine swab. Then plain 1% xylocaine was injected around the site. A # 11 blade was used to make a small cut at the site to expose more of the suture. Then it was cut away as much as possible. There was very minimal bleeding and [name] tolerated the procedure well. The area was cleansed with peroxide and flushed with saline. A dry sterile dressing was applied. His daughter was instructed to keep the area clean and dry. A bandaide may be applied tomorrow. He will f/u as scheduled.

## 2016-10-30 ENCOUNTER — Telehealth: Payer: Self-pay | Admitting: *Deleted

## 2016-10-30 MED ORDER — HYDROCODONE-ACETAMINOPHEN 5-325 MG PO TABS: 1.0000 | ORAL_TABLET | Freq: Four times a day (QID) | ORAL | 0 refills | Status: DC | PRN

## 2016-10-30 NOTE — Telephone Encounter (Signed)
Message from pt's daughter reporting Tramadol is not helping back pain. Requesting stronger pain med. Reviewed with Ned Card, NP: Order received for Hydrocodone. Notified daughter that prescription is ready for pick up.

## 2016-11-04 ENCOUNTER — Ambulatory Visit (HOSPITAL_BASED_OUTPATIENT_CLINIC_OR_DEPARTMENT_OTHER): Payer: BLUE CROSS/BLUE SHIELD | Admitting: Nurse Practitioner

## 2016-11-04 ENCOUNTER — Encounter: Payer: Self-pay | Admitting: *Deleted

## 2016-11-04 ENCOUNTER — Ambulatory Visit (HOSPITAL_BASED_OUTPATIENT_CLINIC_OR_DEPARTMENT_OTHER): Payer: BLUE CROSS/BLUE SHIELD

## 2016-11-04 ENCOUNTER — Other Ambulatory Visit: Payer: Self-pay | Admitting: Nurse Practitioner

## 2016-11-04 ENCOUNTER — Ambulatory Visit (HOSPITAL_COMMUNITY)
Admission: RE | Admit: 2016-11-04 | Discharge: 2016-11-04 | Disposition: A | Discharge status: Home / Self Care | Payer: BLUE CROSS/BLUE SHIELD | Source: Ambulatory Visit | Attending: Nurse Practitioner | Admitting: Nurse Practitioner

## 2016-11-04 VITALS — BP 113/71 | HR 124 | Temp 96.4°F | Resp 16 | Ht 63.0 in | Wt 92.3 lb

## 2016-11-04 DIAGNOSIS — C7801 Secondary malignant neoplasm of right lung: Secondary | ICD-10-CM | POA: Diagnosis not present

## 2016-11-04 DIAGNOSIS — K5900 Constipation, unspecified: Secondary | ICD-10-CM

## 2016-11-04 DIAGNOSIS — C155 Malignant neoplasm of lower third of esophagus: Secondary | ICD-10-CM | POA: Diagnosis not present

## 2016-11-04 DIAGNOSIS — E860 Dehydration: Secondary | ICD-10-CM | POA: Diagnosis not present

## 2016-11-04 DIAGNOSIS — R109 Unspecified abdominal pain: Secondary | ICD-10-CM | POA: Diagnosis present

## 2016-11-04 LAB — COMPREHENSIVE METABOLIC PANEL
ALBUMIN: 2.8 g/dL — AB (ref 3.5–5.0)
ALK PHOS: 99 U/L (ref 40–150)
ALT: 23 U/L (ref 0–55)
AST: 25 U/L (ref 5–34)
Anion Gap: 6 mEq/L (ref 3–11)
BILIRUBIN TOTAL: 0.55 mg/dL (ref 0.20–1.20)
BUN: 12 mg/dL (ref 7.0–26.0)
CO2: 27 mEq/L (ref 22–29)
CREATININE: 0.8 mg/dL (ref 0.7–1.3)
Calcium: 9.7 mg/dL (ref 8.4–10.4)
Chloride: 99 mEq/L (ref 98–109)
GLUCOSE: 147 mg/dL — AB (ref 70–140)
Potassium: 4 mEq/L (ref 3.5–5.1)
SODIUM: 132 meq/L — AB (ref 136–145)
TOTAL PROTEIN: 8.6 g/dL — AB (ref 6.4–8.3)

## 2016-11-04 LAB — CBC WITH DIFFERENTIAL/PLATELET
BASO%: 0.3 % (ref 0.0–2.0)
Basophils Absolute: 0 10*3/uL (ref 0.0–0.1)
EOS ABS: 0.1 10*3/uL (ref 0.0–0.5)
EOS%: 1 % (ref 0.0–7.0)
HCT: 34.8 % — ABNORMAL LOW (ref 38.4–49.9)
HEMOGLOBIN: 11.8 g/dL — AB (ref 13.0–17.1)
LYMPH%: 27.6 % (ref 14.0–49.0)
MCH: 27.8 pg (ref 27.2–33.4)
MCHC: 33.9 g/dL (ref 32.0–36.0)
MCV: 81.9 fL (ref 79.3–98.0)
MONO#: 0.7 10*3/uL (ref 0.1–0.9)
MONO%: 9.5 % (ref 0.0–14.0)
NEUT%: 61.6 % (ref 39.0–75.0)
NEUTROS ABS: 4.4 10*3/uL (ref 1.5–6.5)
Platelets: 212 10*3/uL (ref 140–400)
RBC: 4.25 10*6/uL (ref 4.20–5.82)
RDW: 14.1 % (ref 11.0–14.6)
WBC: 7.1 10*3/uL (ref 4.0–10.3)
lymph#: 2 10*3/uL (ref 0.9–3.3)

## 2016-11-04 MED ORDER — SODIUM CHLORIDE 0.9 % IV SOLN
INTRAVENOUS | Status: AC
  Administered 2016-11-04: 17:00:00 via INTRAVENOUS

## 2016-11-04 NOTE — Progress Notes (Signed)
Urgent LOS sent to schedulers for patient to be seen in [facility] today by C. Berniece Salines NP d/t call to call center on 11/03/16 regarding patient coughing up blood.

## 2016-11-04 NOTE — Progress Notes (Signed)
Transported to Radiology for KUB and then is to return to Infusion area for IVF's  Transported via w/c per Ander Purpura, NT with pt's daughter in attendance.

## 2016-11-04 NOTE — Patient Instructions (Signed)
Dehydration, Adult Dehydration is a condition in which there is not enough fluid or water in the body. This happens when you lose more fluids than you take in. Important organs, such as the kidneys, brain, and heart, cannot function without a proper amount of fluids. Any loss of fluids from the body can lead to dehydration. Dehydration can range from mild to severe. This condition should be treated right away to prevent it from becoming severe. What are the causes? This condition may be caused by:  Vomiting.  Diarrhea.  Excessive sweating, such as from heat exposure or exercise.  Not drinking enough fluid, especially:  When ill.  While doing activity that requires a lot of energy.  Excessive urination.  Fever.  Infection.  Certain medicines, such as medicines that cause the body to lose excess fluid (diuretics).  Inability to access safe drinking water.  Reduced physical ability to get adequate water and food. What increases the risk? This condition is more likely to develop in people:  Who have a poorly controlled long-term (chronic) illness, such as diabetes, heart disease, or kidney disease.  Who are age 65 or older.  Who are disabled.  Who live in a place with high altitude.  Who play endurance sports. What are the signs or symptoms? Symptoms of mild dehydration may include:   Thirst.  Dry lips.  Slightly dry mouth.  Dry, warm skin.  Dizziness. Symptoms of moderate dehydration may include:   Very dry mouth.  Muscle cramps.  Dark urine. Urine may be the color of tea.  Decreased urine production.  Decreased tear production.  Heartbeat that is irregular or faster than normal (palpitations).  Headache.  Light-headedness, especially when you stand up from a sitting position.  Fainting (syncope). Symptoms of severe dehydration may include:   Changes in skin, such as:  Cold and clammy skin.  Blotchy (mottled) or pale skin.  Skin that does  not quickly return to normal after being lightly pinched and released (poor skin turgor).  Changes in body fluids, such as:  Extreme thirst.  No tear production.  Inability to sweat when body temperature is high, such as in hot weather.  Very little urine production.  Changes in vital signs, such as:  Weak pulse.  Pulse that is more than 100 beats a minute when sitting still.  Rapid breathing.  Low blood pressure.  Other changes, such as:  Sunken eyes.  Cold hands and feet.  Confusion.  Lack of energy (lethargy).  Difficulty waking up from sleep.  Short-term weight loss.  Unconsciousness. How is this diagnosed? This condition is diagnosed based on your symptoms and a physical exam. Blood and urine tests may be done to help confirm the diagnosis. How is this treated? Treatment for this condition depends on the severity. Mild or moderate dehydration can often be treated at home. Treatment should be started right away. Do not wait until dehydration becomes severe. Severe dehydration is an emergency and it needs to be treated in a hospital. Treatment for mild dehydration may include:   Drinking more fluids.  Replacing salts and minerals in your blood (electrolytes) that you may have lost. Treatment for moderate dehydration may include:   Drinking an oral rehydration solution (ORS). This is a drink that helps you replace fluids and electrolytes (rehydrate). It can be found at pharmacies and retail stores. Treatment for severe dehydration may include:   Receiving fluids through an IV tube.  Receiving an electrolyte solution through a feeding tube that is   passed through your nose and into your stomach (nasogastric tube, or NG tube).  Correcting any abnormalities in electrolytes.  Treating the underlying cause of dehydration. Follow these instructions at home:  If directed by your health care provider, drink an ORS:  Make an ORS by following instructions on the  package.  Start by drinking small amounts, about  cup (120 mL) every 5-10 minutes.  Slowly increase how much you drink until you have taken the amount recommended by your health care provider.  Drink enough clear fluid to keep your urine clear or pale yellow. If you were told to drink an ORS, finish the ORS first, then start slowly drinking other clear fluids. Drink fluids such as:  Water. Do not drink only water. Doing that can lead to having too little salt (sodium) in the body (hyponatremia).  Ice chips.  Fruit juice that you have added water to (diluted fruit juice).  Low-calorie sports drinks.  Avoid:  Alcohol.  Drinks that contain a lot of sugar. These include high-calorie sports drinks, fruit juice that is not diluted, and soda.  Caffeine.  Foods that are greasy or contain a lot of fat or sugar.  Take over-the-counter and prescription medicines only as told by your health care provider.  Do not take sodium tablets. This can lead to having too much sodium in the body (hypernatremia).  Eat foods that contain a healthy balance of electrolytes, such as bananas, oranges, potatoes, tomatoes, and spinach.  Keep all follow-up visits as told by your health care provider. This is important. Contact a health care provider if:  You have abdominal pain that:  Gets worse.  Stays in one area (localizes).  You have a rash.  You have a stiff neck.  You are more irritable than usual.  You are sleepier or more difficult to wake up than usual.  You feel weak or dizzy.  You feel very thirsty.  You have urinated only a small amount of very dark urine over 6-8 hours. Get help right away if:  You have symptoms of severe dehydration.  You cannot drink fluids without vomiting.  Your symptoms get worse with treatment.  You have a fever.  You have a severe headache.  You have vomiting or diarrhea that:  Gets worse.  Does not go away.  You have blood or green matter  (bile) in your vomit.  You have blood in your stool. This may cause stool to look black and tarry.  You have not urinated in 6-8 hours.  You faint.  Your heart rate while sitting still is over 100 beats a minute.  You have trouble breathing. This information is not intended to replace advice given to you by your health care provider. Make sure you discuss any questions you have with your health care provider. Document Released: 11/04/2005 Document Revised: 05/31/2016 Document Reviewed: 12/29/2015 Elsevier Interactive Patient Education  2017 Elsevier Inc.  

## 2016-11-05 ENCOUNTER — Telehealth: Payer: Self-pay | Admitting: Nurse Practitioner

## 2016-11-05 ENCOUNTER — Encounter: Payer: Self-pay | Admitting: Nurse Practitioner

## 2016-11-05 DIAGNOSIS — E860 Dehydration: Secondary | ICD-10-CM | POA: Insufficient documentation

## 2016-11-05 DIAGNOSIS — K5900 Constipation, unspecified: Secondary | ICD-10-CM | POA: Insufficient documentation

## 2016-11-05 NOTE — Telephone Encounter (Signed)
Called and spoke with patient's daughter  t to check.  Patient's daughter stated the patient was feeling somewhat better today.  Patient has had no vomiting whatsoever.  He has started using the over-the-counter laxatives as directed yesterday; and is alert.  He had one small bowel movement.  Patient will return to the hospital tomorrow for restaging CT of the chest.  Advised patient would hold on obtaining a CT of the abdomen/pelvis for the time being-unless patient develops any worsening abdominal symptoms.  Also, recommended that patient consider additional IV fluid rehydration later this week if patient does not increase his oral intake.

## 2016-11-05 NOTE — Assessment & Plan Note (Signed)
Patient states that he has had decreased oral intake secondary to minimal appetite.  He feels dehydrated today.  Sodium was down to 132.  Patient received IV fluid rehydration while at the cancer Center today.  Advised both patient and his daughter who acted as the translator-that patient would want to consider returning for additional IV fluid rehydration.  If patient does not increase his oral intake this week.

## 2016-11-05 NOTE — Assessment & Plan Note (Signed)
Patient has had no bowel movement for the last 3 days.  Patient's daughter states that patient has had no appetite and has had decreased oral intake as well.  Patient appears dehydrated as well.  Patient denies any specific abdominal discomfort; but does feel that his abdomen is bloated.  He denies any nausea or vomiting.  Patient states that he suffers with chronic shortness of breath as his baseline secondary to his disease.  Bowel sounds are positive in all 4 quadrants.  There was no specific tenderness to the abdomen with palpation.  Was no rebound tenderness.  KUB plain film obtained today reveal constipation but no other acute findings.  There was no obstruction noted per the x-ray results.  Patient was advised to try over-the-counter senna-S; Miralax for other types of laxatives  to treat constipation.  Also, both patient and his daughter were advised to call/return or go directly to the emergency department for any worsening symptoms whatsoever.

## 2016-11-05 NOTE — Progress Notes (Signed)
SYMPTOM MANAGEMENT CLINIC    Chief Complaint: Hemoptysis, dehydration  HPI:  Bruce [name] 55 y.o. male diagnosed with esophageal cancer.  Currently undergoing Keytruda   therapy.     No history exists.    Review of Systems  Constitutional: Positive for malaise/fatigue and weight loss.  Respiratory: Positive for hemoptysis.   Gastrointestinal: Positive for constipation.  Neurological: Positive for weakness.  All other systems reviewed and are negative.   Past Medical History:  Diagnosis Date  . Cancer ([place]) 05/28/12   bx=esophagus=squamous cell carcinoma  . Dysphasia    solid and liquid  . Esophagitis   . Lung cancer ([place]) 12/27/13   right upper lung  . Mass of esophagus 05/28/2012   BX'D DISTAL ESOPHAGUS,PENDING  . Neuropathy ([place])    right hand numbness 1 year 2012    Past Surgical History:  Procedure Laterality Date  . COMPLETE ESOPHAGECTOMY  08/26/2012   Procedure: ESOPHAGECTOMY COMPLETE;  Surgeon: [name], MD;  Location: [facility] OR;  Service: Thoracic;  Laterality: N/A;  transhiatal   . 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 EUS  06/11/2012   Procedure: UPPER ENDOSCOPIC ULTRASOUND (EUS) RADIAL;  Surgeon: Milus Banister, MD;  Location: [facility];  Service: Endoscopy;  Laterality: N/A;  . JEJUNOSTOMY  08/26/2012   Procedure: JEJUNOSTOMY;  Surgeon: [name], MD;  Location: [place];  Service: Thoracic;  Laterality: N/A;  placement of feeding jujunostomy tube  . LYMPH NODE DISSECTION Right 01/31/2014   Procedure: LYMPH NODE DISSECTION;  Surgeon: [name], MD;  Location: [place];  Service: Thoracic;  Laterality: Right;  . PYLOROPLASTY  08/26/2012   Procedure: PYLOROPLASTY;  Surgeon: [name], MD;  Location: [place];  Service: Thoracic;  Laterality: N/A;  . VIDEO ASSISTED THORACOSCOPY (VATS)/WEDGE RESECTION Right 01/31/2014   Procedure: VIDEO ASSISTED  THORACOSCOPY (VATS)/WEDGE RESECTION; with insertion of On Q pain pump;  Surgeon: [name], MD;  Location: [place];  Service: Thoracic;  Laterality: Right;  with insertion of On Q pain pump  . VIDEO BRONCHOSCOPY  08/26/2012   Procedure: VIDEO BRONCHOSCOPY;  Surgeon: [name], MD;  Location: [place];  Service: Thoracic;  Laterality: N/A;  . 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    is allergic to carboplatin.  Allergies as of 11/04/2016      Reactions   Carboplatin Nausea Only, Other (See Comments)   Facial flushing; Headache      Medication List       Accurate as of 11/04/16 11:59 PM. Always use your most recent med list.          albuterol 108 (90 Base) MCG/ACT inhaler Commonly known as:  PROVENTIL HFA;VENTOLIN HFA Inhale 2 puffs into the lungs every 6 (six) hours as needed for wheezing or shortness of breath.   benzonatate 100 MG capsule Commonly known as:  TESSALON TAKE 2 CAPSULES( 200 MG TOTAL) BY MOUTH THREE TIMES DAILY AS NEEDED FOR COUGH   HYDROcodone-acetaminophen 5-325 MG tablet Commonly known as:  NORCO/VICODIN Take 1-2 tablets by mouth every 6 (six) hours as needed for moderate pain.  LORazepam 0.5 MG tablet Commonly known as:  ATIVAN Take 1 tablet (0.5 mg total) by mouth at bedtime as needed for sleep.   morphine CONCENTRATE 10 mg / 0.5 ml concentrated solution Place 0.25-0.5 mLs (5-10 mg total) under the tongue every 4 (four) hours as needed for severe pain or shortness of breath.   pantoprazole 40 MG tablet Commonly known as:  PROTONIX Take 1 tablet (40 mg total) by mouth daily.   polyethylene glycol  packet Commonly known as:  MIRALAX Take 17 g by mouth daily.   prochlorperazine 10 MG tablet Commonly known as:  COMPAZINE Take 1 tablet (10 mg total) by mouth every 6 (six) hours as needed for nausea or vomiting.   temazepam 15 MG capsule Commonly known as:  RESTORIL Take 1 capsule (15 mg total) by mouth at bedtime as needed for sleep.   THERAFLU COLD & COUGH PO Take by mouth.   traMADol 50 MG tablet Commonly known as:  ULTRAM Take 1 tablet (50 mg total) by mouth every 6 (six) hours as needed.        PHYSICAL EXAMINATION  Oncology Vitals 11/04/2016 11/04/2016  Height - 160 cm  Weight - 41.867 kg  Weight (lbs) - 92 lbs 5 oz  BMI (kg/m2) - 16.35 kg/m2  Temp - 96.4  Pulse 110 124  Resp 16 16  Resp (Historical as of 06/18/12) - -  SpO2 99 97  BSA (m2) - 1.36 m2   BP Readings from Last 2 Encounters:  11/04/16 106/75  11/04/16 113/71    Physical Exam  Constitutional: He is oriented to person, place, and time. Vital signs are normal. He appears malnourished and dehydrated. He appears unhealthy. He appears cachectic.  Patient appears very thin, frail, and weak.  HENT:  Head: Normocephalic and atraumatic.  Mouth/Throat: Oropharynx is clear and moist.  Eyes: Conjunctivae and EOM are normal. Pupils are equal, round, and reactive to light. Right eye exhibits no discharge. Left eye exhibits no discharge. No scleral icterus.  Neck: Normal range of motion. Neck supple. No JVD present. No tracheal deviation present. No thyromegaly present.  Cardiovascular: Normal rate, regular rhythm, normal heart sounds and intact distal pulses.   Pulmonary/Chest: Effort normal and breath sounds normal. No respiratory distress. He has no wheezes. He has no rales. He exhibits no tenderness.  Abdominal: Soft. Bowel sounds are normal. He exhibits no distension and no mass. There is no tenderness. There is no rebound and no guarding.  Musculoskeletal: Normal range of motion. He exhibits no edema,  tenderness or deformity.  Lymphadenopathy:    He has no cervical adenopathy.  Neurological: He is alert and oriented to person, place, and time. Gait normal.  Skin: Skin is warm and dry. No rash noted. No erythema. There is pallor.  Psychiatric: Affect normal.  Nursing note and vitals reviewed.   LABORATORY DATA:. Appointment on 11/04/2016  Component Date Value Ref Range Status  . WBC 11/04/2016 7.1  4.0 - 10.3 10e3/uL Final  . NEUT# 11/04/2016 4.4  1.5 - 6.5 10e3/uL Final  . HGB 11/04/2016 11.8* 13.0 - 17.1 g/dL Final  . HCT 11/04/2016 34.8* 38.4 - 49.9 % Final  . Platelets 11/04/2016 212  140 - 400 10e3/uL Final  . MCV 11/04/2016 81.9  79.3 - 98.0 fL Final  . MCH 11/04/2016 27.8  27.2 - 33.4 pg Final  . MCHC 11/04/2016 33.9  32.0 - 36.0 g/dL Final  . RBC 11/04/2016 4.25  4.20 - 5.82 10e6/uL Final  .  RDW 11/04/2016 14.1  11.0 - 14.6 % Final  . lymph# 11/04/2016 2.0  0.9 - 3.3 10e3/uL Final  . MONO# 11/04/2016 0.7  0.1 - 0.9 10e3/uL Final  . Eosinophils Absolute 11/04/2016 0.1  0.0 - 0.5 10e3/uL Final  . Basophils Absolute 11/04/2016 0.0  0.0 - 0.1 10e3/uL Final  . NEUT% 11/04/2016 61.6  39.0 - 75.0 % Final  . LYMPH% 11/04/2016 27.6  14.0 - 49.0 % Final  . MONO% 11/04/2016 9.5  0.0 - 14.0 % Final  . EOS% 11/04/2016 1.0  0.0 - 7.0 % Final  . BASO% 11/04/2016 0.3  0.0 - 2.0 % Final  . Sodium 11/04/2016 132* 136 - 145 mEq/L Final  . Potassium 11/04/2016 4.0  3.5 - 5.1 mEq/L Final  . Chloride 11/04/2016 99  98 - 109 mEq/L Final  . CO2 11/04/2016 27  22 - 29 mEq/L Final  . Glucose 11/04/2016 147* 70 - 140 mg/dl Final  . BUN 11/04/2016 12.0  7.0 - 26.0 mg/dL Final  . Creatinine 11/04/2016 0.8  0.7 - 1.3 mg/dL Final  . Total Bilirubin 11/04/2016 0.55  0.20 - 1.20 mg/dL Final  . Alkaline Phosphatase 11/04/2016 99  40 - 150 U/L Final  . AST 11/04/2016 25  5 - 34 U/L Final  . ALT 11/04/2016 23  0 - 55 U/L Final  . Total Protein 11/04/2016 8.6* 6.4 - 8.3 g/dL Final  . Albumin  11/04/2016 2.8* 3.5 - 5.0 g/dL Final  . Calcium 11/04/2016 9.7  8.4 - 10.4 mg/dL Final  . Anion Gap 11/04/2016 6  3 - 11 mEq/L Final  . EGFR 11/04/2016 >90  >90 ml/min/1.73 m2 Final    RADIOGRAPHIC STUDIES: Dg Abd 1 View  Result Date: 11/04/2016 CLINICAL DATA:  Abdominal pain. EXAM: ABDOMEN - 1 VIEW COMPARISON:  No recent prior . FINDINGS: Soft tissue structures are unremarkable. No bowel distention. Stool noted throughout the colon. A button noted projected over the right abdomen, this may be extrinsic to the patient. No acute bony abnormality . IMPRESSION: No acute or focal abnormality. Electronically Signed   By: [place]   On: 11/04/2016 16:28    ASSESSMENT/PLAN:    Malignant neoplasm of lower third of esophagus [facility]) Patient received cycle 4 of his Keytruda therapy on 10/22/2016.    Patient's daughter states that patient had a few episodes of hemoptysis within the past few days.  Blood counts obtained today were essentially stable.  Patient had no obvious hemoptysis.  On exam.  Patient was advised to call/return or go directly to the emergency department for any worsening symptoms whatsoever.  Note: Patient's daughter acted as Optometrist today.  He is scheduled for restaging CT of the chest on 11/06/2016.  He is scheduled for labs, visit, and his next cycle of therapy on 11/12/2016.  Dehydration Patient states that he has had decreased oral intake secondary to minimal appetite.  He feels dehydrated today.  Sodium was down to 132.  Patient received IV fluid rehydration while at the cancer Center today.  Advised both patient and his daughter who acted as the translator-that patient would want to consider returning for additional IV fluid rehydration.  If patient does not increase his oral intake this week.  Constipation Patient has had no bowel movement for the last 3 days.  Patient's daughter states that patient has had no appetite and has had decreased oral intake as well.   Patient appears dehydrated as well.  Patient denies any specific abdominal discomfort; but  does feel that his abdomen is bloated.  He denies any nausea or vomiting.  Patient states that he suffers with chronic shortness of breath as his baseline secondary to his disease.  Bowel sounds are positive in all 4 quadrants.  There was no specific tenderness to the abdomen with palpation.  Was no rebound tenderness.  KUB plain film obtained today reveal constipation but no other acute findings.  There was no obstruction noted per the x-ray results.  Patient was advised to try over-the-counter senna-S; Miralax for other types of laxatives  to treat constipation.  Also, both patient and his daughter were advised to call/return or go directly to the emergency department for any worsening symptoms whatsoever.   Patient stated understanding of all instructions; and was in agreement with this plan of care. The patient knows to call the clinic with any problems, questions or concerns.   Total time spent with patient was 40 minutes;  with greater than 75 percent of that time spent in face to face counseling regarding patient's symptoms,  and coordination of care and follow up.  Disclaimer:This dictation was prepared with Dragon/digital dictation along with [employer]. Any transcriptional errors that result from this process are unintentional.  Drue Second, NP 11/05/2016

## 2016-11-05 NOTE — Assessment & Plan Note (Addendum)
Patient received cycle 4 of his Keytruda therapy on 10/22/2016.    Patient's daughter states that patient had a few episodes of hemoptysis within the past few days.  Blood counts obtained today were essentially stable.  Patient had no obvious hemoptysis.  On exam.  Patient was advised to call/return or go directly to the emergency department for any worsening symptoms whatsoever.  Note: Patient's daughter acted as Optometrist today.  He is scheduled for restaging CT of the chest on 11/06/2016.  He is scheduled for labs, visit, and his next cycle of therapy on 11/12/2016.

## 2016-11-06 ENCOUNTER — Ambulatory Visit (HOSPITAL_COMMUNITY)
Admission: RE | Admit: 2016-11-06 | Discharge: 2016-11-06 | Disposition: A | Discharge status: Home / Self Care | Payer: BLUE CROSS/BLUE SHIELD | Source: Ambulatory Visit | Attending: Nurse Practitioner | Admitting: Nurse Practitioner

## 2016-11-06 ENCOUNTER — Encounter: Payer: Self-pay | Admitting: Radiation Oncology

## 2016-11-06 ENCOUNTER — Other Ambulatory Visit: Payer: Self-pay | Admitting: Nurse Practitioner

## 2016-11-06 ENCOUNTER — Telehealth: Payer: Self-pay | Admitting: Nurse Practitioner

## 2016-11-06 DIAGNOSIS — C155 Malignant neoplasm of lower third of esophagus: Secondary | ICD-10-CM | POA: Diagnosis not present

## 2016-11-06 DIAGNOSIS — R590 Localized enlarged lymph nodes: Secondary | ICD-10-CM | POA: Diagnosis not present

## 2016-11-06 DIAGNOSIS — C771 Secondary and unspecified malignant neoplasm of intrathoracic lymph nodes: Secondary | ICD-10-CM | POA: Diagnosis not present

## 2016-11-06 DIAGNOSIS — I700 Atherosclerosis of aorta: Secondary | ICD-10-CM | POA: Diagnosis not present

## 2016-11-06 DIAGNOSIS — C7801 Secondary malignant neoplasm of right lung: Secondary | ICD-10-CM | POA: Insufficient documentation

## 2016-11-06 DIAGNOSIS — J90 Pleural effusion, not elsewhere classified: Secondary | ICD-10-CM | POA: Diagnosis not present

## 2016-11-06 DIAGNOSIS — C7800 Secondary malignant neoplasm of unspecified lung: Secondary | ICD-10-CM

## 2016-11-06 MED ORDER — IOPAMIDOL (ISOVUE-300) INJECTION 61%
INTRAVENOUS | Status: AC
  Filled 2016-11-06: qty 75

## 2016-11-06 MED ORDER — IOPAMIDOL (ISOVUE-300) INJECTION 61%
75.0000 mL | Freq: Once | INTRAVENOUS | Status: DC | PRN
  Administered 2016-11-06: 60 mL via INTRAVENOUS
  Filled 2016-11-06: qty 75

## 2016-11-06 NOTE — Telephone Encounter (Signed)
I reviewed today's CT with Dr Julien Nordmann. I contacted Bruce [name] daughter regarding the results---evidence of progression and new SVC extrinsic narrowing. Urgent referral made to radiation oncology.

## 2016-11-07 ENCOUNTER — Telehealth: Payer: Self-pay

## 2016-11-07 NOTE — Telephone Encounter (Signed)
Called and spoke with patients daughter, informed her of appt times for radiation and if patient has increased SOB interm to go to the ED. Mai verbalized understanding.

## 2016-11-11 ENCOUNTER — Other Ambulatory Visit: Payer: Self-pay | Admitting: Oncology

## 2016-11-12 ENCOUNTER — Other Ambulatory Visit: Payer: Self-pay | Admitting: *Deleted

## 2016-11-12 ENCOUNTER — Ambulatory Visit: Payer: BLUE CROSS/BLUE SHIELD

## 2016-11-12 ENCOUNTER — Telehealth: Payer: Self-pay | Admitting: Oncology

## 2016-11-12 ENCOUNTER — Ambulatory Visit (HOSPITAL_BASED_OUTPATIENT_CLINIC_OR_DEPARTMENT_OTHER): Payer: BLUE CROSS/BLUE SHIELD | Admitting: Oncology

## 2016-11-12 ENCOUNTER — Other Ambulatory Visit (HOSPITAL_BASED_OUTPATIENT_CLINIC_OR_DEPARTMENT_OTHER): Payer: BLUE CROSS/BLUE SHIELD

## 2016-11-12 VITALS — BP 95/67 | HR 122 | Temp 98.7°F | Resp 24 | Ht 63.0 in | Wt 91.3 lb

## 2016-11-12 DIAGNOSIS — C7801 Secondary malignant neoplasm of right lung: Secondary | ICD-10-CM

## 2016-11-12 DIAGNOSIS — C155 Malignant neoplasm of lower third of esophagus: Secondary | ICD-10-CM

## 2016-11-12 DIAGNOSIS — R911 Solitary pulmonary nodule: Secondary | ICD-10-CM

## 2016-11-12 DIAGNOSIS — I82210 Acute embolism and thrombosis of superior vena cava: Secondary | ICD-10-CM

## 2016-11-12 DIAGNOSIS — J910 Malignant pleural effusion: Secondary | ICD-10-CM | POA: Diagnosis not present

## 2016-11-12 DIAGNOSIS — C7800 Secondary malignant neoplasm of unspecified lung: Secondary | ICD-10-CM

## 2016-11-12 LAB — COMPREHENSIVE METABOLIC PANEL
ALK PHOS: 137 U/L (ref 40–150)
ALT: 28 U/L (ref 0–55)
AST: 29 U/L (ref 5–34)
Albumin: 2.9 g/dL — ABNORMAL LOW (ref 3.5–5.0)
Anion Gap: 7 mEq/L (ref 3–11)
BUN: 7.8 mg/dL (ref 7.0–26.0)
CHLORIDE: 95 meq/L — AB (ref 98–109)
CO2: 26 meq/L (ref 22–29)
Calcium: 10 mg/dL (ref 8.4–10.4)
Creatinine: 0.8 mg/dL (ref 0.7–1.3)
GLUCOSE: 155 mg/dL — AB (ref 70–140)
POTASSIUM: 3.7 meq/L (ref 3.5–5.1)
SODIUM: 128 meq/L — AB (ref 136–145)
Total Bilirubin: 0.56 mg/dL (ref 0.20–1.20)
Total Protein: 8.4 g/dL — ABNORMAL HIGH (ref 6.4–8.3)

## 2016-11-12 LAB — CBC WITH DIFFERENTIAL/PLATELET
BASO%: 0.3 % (ref 0.0–2.0)
BASOS ABS: 0 10*3/uL (ref 0.0–0.1)
EOS ABS: 0 10*3/uL (ref 0.0–0.5)
EOS%: 0.5 % (ref 0.0–7.0)
HCT: 33.3 % — ABNORMAL LOW (ref 38.4–49.9)
HGB: 11.3 g/dL — ABNORMAL LOW (ref 13.0–17.1)
LYMPH%: 23.1 % (ref 14.0–49.0)
MCH: 27.6 pg (ref 27.2–33.4)
MCHC: 33.9 g/dL (ref 32.0–36.0)
MCV: 81.2 fL (ref 79.3–98.0)
MONO#: 0.7 10*3/uL (ref 0.1–0.9)
MONO%: 8.6 % (ref 0.0–14.0)
NEUT#: 5.2 10*3/uL (ref 1.5–6.5)
NEUT%: 67.5 % (ref 39.0–75.0)
Platelets: 230 10*3/uL (ref 140–400)
RBC: 4.1 10*6/uL — AB (ref 4.20–5.82)
RDW: 14 % (ref 11.0–14.6)
WBC: 7.7 10*3/uL (ref 4.0–10.3)
lymph#: 1.8 10*3/uL (ref 0.9–3.3)

## 2016-11-12 MED ORDER — MORPHINE SULFATE (CONCENTRATE) 10 MG /0.5 ML PO SOLN: 5.0000 mg | ORAL | 0 refills | Status: AC | PRN

## 2016-11-12 NOTE — Progress Notes (Signed)
[place] OFFICE PROGRESS NOTE   Diagnosis: Esophagus cancer  INTERVAL HISTORY:   [name] returns as scheduled. He was seen in the symptom management clinic on 11/04/2016 with a report of hemoptysis and constipation. He had abdominal discomfort that improved after laxatives. A CT of the chest 11/06/2016 revealed progressive chest lymphadenopathy. A progressive confluent right prevascular mass has increased in size with new high-grade narrowing of the SVC. With a new anterior and left mediastinal and bilateral paraspinous venous collaterals. New left pleural effusion. New pulmonary nodules. Worsening right lower lobe consolidation.  He is present with his daughter. She reports she has pain after eating solid food. The family has noted swelling of his face. Morphine helps pain and dyspnea.  Objective:  Vital signs in last 24 hours:  Blood pressure 95/67, pulse (!) 130, temperature 98.7 F (37.1 C), temperature source Oral, resp. rate 18, height '5\' 3"'$  (1.6 m), weight 91 lb 4.8 oz (41.4 kg), SpO2 97 %.    HEENT:  mild facial and neck edema Resp:  decreased breath sounds throughout the right chest Cardio:  tachycardia, regular rate and rhythm GI:  no hepatomegaly Vascular:  no leg or arm edema. Venous congestion at the neck and upper anterior chest Lymph nodes: Firm nodal masses in the bilateral supraclavicular fossa    Portacath/PICC-without erythema  Lab Results:  Lab Results  Component Value Date   WBC 7.7 11/12/2016   HGB 11.3 (L) 11/12/2016   HCT 33.3 (L) 11/12/2016   MCV 81.2 11/12/2016   PLT 230 11/12/2016   NEUTROABS 5.2 11/12/2016     Medications: I have reviewed the patient's current medications.  Assessment/Plan: 1. Squamous cell carcinoma. Staging CT scans of the chest, abdomen and pelvis on 06/02/2012 negative for evidence of metastatic disease. Endoscopic ultrasound 06/11/2012 confirmed a uT3 uN1 tumor at 40 cm from the incisors. He began  radiation 06/15/2012. He began weekly Taxol/carboplatin chemotherapy 06/16/2012. He completed 6 weekly chemotherapy treatments. The carboplatin was held with week 5 due to thrombocytopenia. He completed the sixth weekly treatment with Taxol and carboplatin on 07/23/2012. He completed radiation on 07/29/2012 -he underwent an esophagectomy on 08/26/2012 with the pathology confirming a ypT3,ypN0 squamous cell carcinoma with perineural invasion identified.  2. History of solid/liquid dysphagia secondary to the obstructing esophagus mass.  3. Reflux. He is taking Protonix. 4. Hospitalization 07/19/2014 with abdominal pain and vomiting. CT scan showed a mid small bowel obstruction without discrete mass or evident etiology. Small bowel obstruction felt to be secondary to adhesions. 5. Renal failure during the 07/19/2014 hospitalization felt to be contrast-induced nephropathy. 6. Chest CT 06/15/2013 with postoperative changes from esophagectomy and gastric pull-through. 7 mm posterior subpleural nodule in the right lower lobe. Followup CT scan 12/16/2013 revealed an increase in the size of a right hilar node and right upper lobe nodule.  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  Chest CT 12/23/2014 with enlarged  right hilar lymph node measuring 1.2 cm, previously  0.8 cm. Slight enlargement of a periaortic lymph node, 8 mm as compared to 7 mm on the previous study. Increased thickening of the right pleural rind. Moderate sized right pleural effusion.  Chest x-ray 11/28/2015, no change in right pleural effusion  Chest x-ray 04/18/2016 with increase in the right pleural effusion.  Thoracentesis 04/24/2016-only 25 mL of pleural fluid could be obtained  CT chest 04/24/2016-progressive mediastinal/hilar lymphadenopathy, large right pleural effusion with mixed density, interstitial thickening in the right lower lung concerning for lymphatic tumor spread  Salvage chemotherapy with weekly Taxol/carboplatin initiated 04/30/2016  Cycle 2 weekly Taxol/carboplatin 06/11/2016(chemotherapy dose reduced due to neutropenia); week 3 held on 06/25/2016 due to neutropenia.   Schedule adjusted to every other week beginning 07/02/2016  07/02/2016 he developed nausea/vomiting, headache and facial flushing during the carboplatin infusion   Carboplatin eliminated from the regimen beginning 07/16/2016  CT chest 07/29/2016-mildly improved mediastinal lymphadenopathy,persistent right pleural effusion, possible new left lower lobe nodules  PD-L1 10%  Cycle 1 Pembrolizumab 08/23/2016  Cycle 2 Pembrolizumab 09/09/2016  Cycle 3 Pembrolizumab11/14/2017  Cycle 4 Pembrolizumab 10/22/2016  CT 11/06/2016-progressive chest lymphadenopathy with new SVC obstruction, new lung nodules, progressive right lung consolidation  7. Headaches. Negative brain CT 12/23/2014.    Disposition:   [name] has progressive metastatic esophagus cancer. 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 I reviewed the CT images with [name] and his daughter. I explained no therapy will be curative. There is a small chance additional systemic therapy resulted in a partial decrease in the measurable tumor burden. He has been referred to radiation oncology to consider  palliative radiation to the chest lymphadenopathy causing the SVC obstruction.  We discussed comfort care versus a trial of salvage chemotherapy. His daughter indicated that the patient and family favor one additional try with systemic therapy. I recommend irinotecan/cisplatin.  We reviewed potential toxicities associated with this chemotherapy regimen including the chance for alopecia, hematologic toxicity, nausea/vomiting, diarrhea, and neuropathy. He agrees to proceed.  He will return for an office visit with the plan to begin salvage irinotecan/cisplatin on 11/20/2015.  Approximately 40 minutes were spent with patient today. The majority of the time used for counseling and correlation of care.  Betsy Coder, MD  11/12/2016  10:48 AM

## 2016-11-12 NOTE — Telephone Encounter (Signed)
Message sent to [facility] for Chemo to be added for 11/19/16. This will be the patient's 1st time with this treatment. Will await response from [place], regarding which date she can accommodate patient to start chemo. Patient is aware and informed that he will receive a call regarding his initial start date. Follow up and lab was scheduled for 11/19/16, per 11/12/16 los. 01/09 and 01/16 Chemo and lab was scheduled, per 11/12/16 los. Patient was given a copy of the appointment schedule and the AVS report, per 11/12/16 los.

## 2016-11-14 ENCOUNTER — Ambulatory Visit
Admission: RE | Admit: 2016-11-14 | Discharge: 2016-11-14 | Disposition: A | Discharge status: Home / Self Care | Payer: BLUE CROSS/BLUE SHIELD | Source: Ambulatory Visit | Attending: Radiation Oncology | Admitting: Radiation Oncology

## 2016-11-14 DIAGNOSIS — Z8501 Personal history of malignant neoplasm of esophagus: Secondary | ICD-10-CM | POA: Diagnosis not present

## 2016-11-14 DIAGNOSIS — Z9221 Personal history of antineoplastic chemotherapy: Secondary | ICD-10-CM | POA: Diagnosis not present

## 2016-11-14 DIAGNOSIS — Z510 Encounter for antineoplastic radiation therapy: Secondary | ICD-10-CM | POA: Insufficient documentation

## 2016-11-14 DIAGNOSIS — I871 Compression of vein: Secondary | ICD-10-CM | POA: Diagnosis not present

## 2016-11-14 DIAGNOSIS — Z923 Personal history of irradiation: Secondary | ICD-10-CM | POA: Insufficient documentation

## 2016-11-14 DIAGNOSIS — R590 Localized enlarged lymph nodes: Secondary | ICD-10-CM | POA: Diagnosis not present

## 2016-11-14 DIAGNOSIS — C781 Secondary malignant neoplasm of mediastinum: Secondary | ICD-10-CM | POA: Diagnosis not present

## 2016-11-14 DIAGNOSIS — Z888 Allergy status to other drugs, medicaments and biological substances status: Secondary | ICD-10-CM | POA: Insufficient documentation

## 2016-11-14 DIAGNOSIS — C7801 Secondary malignant neoplasm of right lung: Secondary | ICD-10-CM | POA: Insufficient documentation

## 2016-11-14 DIAGNOSIS — M549 Dorsalgia, unspecified: Secondary | ICD-10-CM | POA: Diagnosis not present

## 2016-11-14 DIAGNOSIS — Z79899 Other long term (current) drug therapy: Secondary | ICD-10-CM | POA: Diagnosis not present

## 2016-11-14 DIAGNOSIS — C7800 Secondary malignant neoplasm of unspecified lung: Secondary | ICD-10-CM

## 2016-11-14 NOTE — Progress Notes (Signed)
Please see the Nurse Progress Note in the MD Initial Consult Encounter for this patient. 

## 2016-11-14 NOTE — Progress Notes (Signed)
Reconsult has had radiation to his esophagus

## 2016-11-14 NOTE — Progress Notes (Signed)
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 This was given to the gross tumor volume in the lower esophagus to a dose of 9 gray at 1.8 gray per fraction. The patient then received a boost treatment for an additional 45 gray. This was given using a IMRT technique on her chemotherapy unit with daily image guidance. The final total dose was 54 gray. Concurrent chemotherapy was given.  Narrative:  The patient returns today for a re-consultation regarding his metastatic SCC of the esophagus.  The patient's disease has been progressive for some time on imaging and with the patient having a wedge resection of a right upper lung mass and lymph node sampling in Mar[place] 2015 with pathology confirming a 2.1 cm SCC in the RUL with lymphovascular invasion and a positive node.  The patient was seen in the symptom management clinic on 11/04/16 with a report of hemoptysis and constipation. CT of the chest on 11/06/16 revealed progressive chest lymphadenopathy, a progressive confluent right prevascular mass has increased in size with new high-grade narrowing of the SVC, new anterior and left mediastinal and bilateral paraspinous venous collaterals, a new left pleural effusion, new pulmonary nodules, and worsening right lower lobe consolidation.  The patient saw Dr. Benay Spice on 11/12/16 who discussed comfort care vs a trial of salvage chemotherapy. The patient and family liked to proceed with systemic therapy and Dr. Benay Spice recommended irinotecan/cisplatin.  The patient has been referred today to consider  palliative radiation to the chest lymphadenopathy causing the SVC obstruction.  The patient's daughter presented with the patient who interpreted on the patient's behalf. They declined a hospital provided interpreter.  On review of systems: The patient complains of feeling SOB when he lies down and sleeps.and has to prop himself up in order to sleep. He also reports intermittent upper back pain.  ALLERGIES:  is allergic to carboplatin.  Meds: Current Outpatient Prescriptions  Medication Sig Dispense Refill  . albuterol (PROVENTIL HFA;VENTOLIN HFA) 108 (90 Base) MCG/ACT inhaler Inhale 2 puffs into the lungs every 6 (six) hours as needed for wheezing or shortness of breath. 1 Inhaler 1  . LORazepam (ATIVAN) 0.5 MG tablet Take 1 tablet (0.5 mg total) by mouth at bedtime as needed for sleep. 30 tablet 0  . Morphine Sulfate (MORPHINE CONCENTRATE) 10 mg / 0.5 ml concentrated solution Place 0.25-0.5 mLs (5-10 mg total) under the tongue every 4 (four) hours as needed for severe pain or shortness of breath. 30 mL 0  . pantoprazole (PROTONIX) 40 MG tablet Take 1 tablet (40 mg total) by mouth daily. 30 tablet 5  . polyethylene glycol (MIRALAX) packet Take 17 g by mouth daily. 30 each 0  . prochlorperazine (COMPAZINE) 10 MG tablet Take 1 tablet (10 mg total) by mouth every 6 (six) hours as needed for nausea or vomiting. 30 tablet 1  . benzonatate (TESSALON) 100 MG capsule TAKE 2 CAPSULES( 200 MG TOTAL) BY MOUTH THREE TIMES DAILY AS NEEDED FOR COUGH (Patient not taking: Reported on 11/14/2016) 90 capsule 1  . HYDROcodone-acetaminophen (NORCO/VICODIN) 5-325 MG tablet Take 1-2 tablets by mouth every 6 (six)  hours as needed for moderate pain. (Patient not taking: Reported on 11/14/2016) 50 tablet 0  . Phenylephrine-Pheniramine-DM (THERAFLU COLD & COUGH PO) Take by mouth.    . temazepam (RESTORIL) 15 MG capsule Take 1 capsule (15 mg total) by mouth at bedtime as needed for sleep. (Patient not taking: Reported on  11/14/2016) 30 capsule 1  . traMADol (ULTRAM) 50 MG tablet Take 1 tablet (50 mg total) by mouth every 6 (six) hours as needed. (Patient not taking: Reported on 11/14/2016) 60 tablet 0   No current facility-administered medications for this encounter.     Physical Findings: The patient is in no acute distress. Patient is alert and oriented.  height is '5\' 3"'$  (1.6 m) and weight is 91 lb (41.3 kg). His oral temperature is 98 F (36.7 C). His blood pressure is 103/81 and his pulse is 126 (abnormal). His respiration is 20 and oxygen saturation is 96%.   General: Well-developed, in no acute distress HEENT: Normocephalic, atraumatic Cardiovascular: Regular rate and rhythm Respiratory: Decreased breath sounds diffusely in the right lung. GI: Soft, nontender, normal bowel sounds Extremities: No edema present  Lab Findings: Lab Results  Component Value Date   WBC 7.7 11/12/2016   HGB 11.3 (L) 11/12/2016   HCT 33.3 (L) 11/12/2016   MCV 81.2 11/12/2016   PLT 230 11/12/2016     Radiographic Findings: Dg Abd 1 View  Result Date: 11/04/2016 CLINICAL DATA:  Abdominal pain. EXAM: ABDOMEN - 1 VIEW COMPARISON:  No recent prior . FINDINGS: Soft tissue structures are unremarkable. No bowel distention. Stool noted throughout the colon. A button noted projected over the right abdomen, this may be extrinsic to the patient. No acute bony abnormality . IMPRESSION: No acute or focal abnormality. Electronically Signed   By: Marcello Moores  Register   On: 11/04/2016 16:28   Ct Chest W Contrast  Result Date: 11/06/2016 CLINICAL DATA:  Squamous cell carcinoma of the lower third of the esophagus diagnosed in July 2013 status post esophagectomy 08/26/2012, with metastatic pulmonary disease status post right upper lobe wedge resection and radiation therapy with ongoing chemotherapy, presenting for restaging. EXAM: CT CHEST WITH CONTRAST TECHNIQUE: Multidetector CT imaging of the chest was performed during intravenous  contrast administration. CONTRAST:  56m ISOVUE-300 IOPAMIDOL (ISOVUE-300) INJECTION 61% COMPARISON:  07/29/2016 chest CT. FINDINGS: Motion degraded scan. Cardiovascular: Normal heart size. No significant pericardial fluid/thickening. Mildly atherosclerotic nonaneurysmal thoracic aorta. Normal caliber main pulmonary artery. No central pulmonary emboli. Mediastinum/Nodes: No discrete thyroid nodules. Status post esophagectomy and gastric pull-through with intact appearing esophagogastric anastomosis at the level of the thoracic inlet. Newly enlarged right axillary nodes measuring up to 1.2 cm (series 2/ image 51). No left axillary adenopathy. Newly enlarged 1.4 cm right supraclavicular node (series 2/image 7). Enlarged left supraclavicular 2.6 cm node (series 2/image 7), increased from 1.6 cm. Confluent infiltrative right prevascular/ right paratracheal mediastinal lymphadenopathy measuring up to 4.8 x 3.5 cm (series 2/image 48), increased from 4.4 x 2.7 cm, with associated new high-grade extrinsic narrowing of the SVC (series 2/ image 47). There is new contrast-enhancement of innumerable anterior and left mediastinal and bilateral paraspinal venous collaterals draining to the portal venous system in the upper abdomen. Enlarged 2.5 cm left prevascular mediastinal node (series 2/ image 48), increased from 2.0 cm. Infiltrative AP window/left lower paratracheal 2.6 cm node (series 2/ image 53), previously 2.5 cm, minimally increased. Enlarged 1.6 cm right hilar node (series 2/image 60), increased from 1.2 cm. Left hilar adenopathy measuring up to 1.6 cm (  series 2/ image 58), increased from 1.0 cm. Mildly enlarged 1.5 cm subcarinal node (series 2/ image 61), slightly increased from 1.4 cm. Lungs/Pleura: No pneumothorax. Moderate loculated predominantly basilar right pleural effusion is stable in size and appearance with diffuse right pleural thickening and heterogeneous internal density within the right pleural effusion.  New small layering left pleural effusion. Status post posterior right upper lobe wedge resection. Mild-to-moderate centrilobular emphysema. At least 6 new scattered solid pulmonary nodules throughout both lungs, largest 1.0 cm in the apical right upper lobe (series 5/image 40). Previously described left lower lobe 5 mm and 3 mm pulmonary nodules (series 5/ images 79 and 77) appear stable. Patchy subpleural reticulation throughout both lungs has not appreciably changed. Worsening volume loss and consolidation in the right lower lobe, favor a combination of compressive and rounded atelectasis. Upper abdomen: No discrete adrenal nodules. Musculoskeletal: Mild thoracic spondylosis. New patchy sclerosis throughout the visualized lower cervical and thoracic vertebral bodies, predominantly in the posterior portion of the vertebral bodies, probably representing venous collateral enhancement given the SVC obstruction. IMPRESSION: 1. Interval growth of infiltrative right prevascular/right paratracheal mediastinal nodal metastases, with associated new high-grade extrinsic SVC obstruction. 2. Interval growth of bilateral supraclavicular, left prevascular, AP window, subcarinal and bilateral hilar nodal metastases. 3. New right hilar adenopathy, which could represent metastatic disease versus congestion from SVC obstruction. 4. Several (at least 6) new solid pulmonary nodules scattered throughout both lungs measuring up to 1.0 cm in the apical right upper lobe, most consistent with new pulmonary metastases. 5. Stable moderate loculated right pleural effusion with diffuse right pleural thickening. New small left pleural effusion. 6. Aortic atherosclerosis. Electronically Signed   By: Ilona Sorrel M.D.   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 I recommend 35  Gy in 14 fractions with chemotherapy.  Today, I talked to the patient and family about the findings and work-up thus far.  We discussed the natural history of metastatic SCC of the esophagus and general treatment, highlighting the role of radiotherapy in the management.  We discussed the available radiation techniques, and focused on the details of logistics and delivery.  We reviewed the anticipated acute and late sequelae associated with radiation in this setting.  The patient was encouraged to ask questions that I answered to the best of my ability.  Plan: The patient would like to proceed with radiation and will be scheduled for CT simulation. The patient signed a consent form and a copy was placed in his medical chart. CT simulation will be scheduled for tomorrow morning and we anticipate the patient to start treatment next Tuesday. Dr. Benay Spice with have the patient start chemotherapy on 11/19/16.  ------------------------------------------------  Jodelle Gross, MD, PhD  This document serves as a record of services personally performed by Kyung Rudd, MD. It was created on his behalf by Darcus Austin, a trained medical scribe. The creation of this record is based on the scribe's personal observations and the provider's statements to them. This document has been checked and approved by the attending provider.

## 2016-11-14 NOTE — Progress Notes (Signed)
Bruce [name] has had radiation to his esophagus 06/15/12-07/29/12 total 54 Gy (Squamous cell carcinoma )  Surgery Esophagectomy 08/26/12    Dr. Benay Spice seen 10/22/16 completed cycle 3 Pembrolizumab 10/01/16, c/o  Shortness breath,  unable to eat soft foods only liquids, drinks 1 can boost daily, take morphine , CT  Chest  11/08/16  For restaging  Post wedge resection right upper lung mass and lymph node  01/31/14, bx 12/27/13=invasive squamous cell  Pain right and left upper back pain, soreness aching,    CT Chest 11/08/16 for restaging= IMPRESSION: 1. Interval growth of infiltrative right prevascular/right paratracheal mediastinal nodal metastases, with associated new high-grade extrinsic SVC obstruction. 2. Interval growth of bilateral supraclavicular, left prevascular, AP window, subcarinal and bilateral hilar nodal metastases. 3. New right hilar adenopathy, which could represent metastatic disease versus congestion from SVC obstruction. 4. Several (at least 6) new solid pulmonary nodules scattered throughout both lungs measuring up to 1.0 cm in the apical right upper lobe, most consistent with new pulmonary metastases. 5. Stable moderate loculated right pleural effusion with diffuse right pleural thickening. New small left pleural effusion. 6. Aortic atherosclerosis. BP 103/81 (BP Location: Left Arm, Patient Position: Sitting, Cuff Size: Normal)   Pulse (!) 126   Temp 98 F (36.7 C) (Oral)   Resp 20   Ht '5\' 3"'$  (1.6 m)   Wt 91 lb (41.3 kg)   SpO2 96% Comment: room air  BMI 16.12 kg/m   Wt Readings from Last 3 Encounters:  11/14/16 91 lb (41.3 kg)  11/12/16 91 lb 4.8 oz (41.4 kg)  11/04/16 92 lb 4.8 oz (41.9 kg)

## 2016-11-15 ENCOUNTER — Ambulatory Visit
Admission: RE | Admit: 2016-11-15 | Discharge: 2016-11-15 | Disposition: A | Discharge status: Home / Self Care | Payer: BLUE CROSS/BLUE SHIELD | Source: Ambulatory Visit | Attending: Radiation Oncology | Admitting: Radiation Oncology

## 2016-11-15 DIAGNOSIS — C781 Secondary malignant neoplasm of mediastinum: Secondary | ICD-10-CM

## 2016-11-15 DIAGNOSIS — Z510 Encounter for antineoplastic radiation therapy: Secondary | ICD-10-CM | POA: Diagnosis not present

## 2016-11-18 ENCOUNTER — Other Ambulatory Visit: Payer: Self-pay | Admitting: Oncology

## 2016-11-18 DIAGNOSIS — M549 Dorsalgia, unspecified: Secondary | ICD-10-CM | POA: Diagnosis not present

## 2016-11-18 DIAGNOSIS — Z79899 Other long term (current) drug therapy: Secondary | ICD-10-CM | POA: Diagnosis not present

## 2016-11-18 DIAGNOSIS — Z923 Personal history of irradiation: Secondary | ICD-10-CM | POA: Diagnosis not present

## 2016-11-18 DIAGNOSIS — Z510 Encounter for antineoplastic radiation therapy: Secondary | ICD-10-CM | POA: Diagnosis present

## 2016-11-18 DIAGNOSIS — Z8501 Personal history of malignant neoplasm of esophagus: Secondary | ICD-10-CM | POA: Diagnosis not present

## 2016-11-18 DIAGNOSIS — R590 Localized enlarged lymph nodes: Secondary | ICD-10-CM | POA: Diagnosis not present

## 2016-11-18 DIAGNOSIS — Z888 Allergy status to other drugs, medicaments and biological substances status: Secondary | ICD-10-CM | POA: Diagnosis not present

## 2016-11-18 DIAGNOSIS — I871 Compression of vein: Secondary | ICD-10-CM | POA: Diagnosis not present

## 2016-11-18 DIAGNOSIS — C7801 Secondary malignant neoplasm of right lung: Secondary | ICD-10-CM | POA: Diagnosis not present

## 2016-11-18 DIAGNOSIS — Z9221 Personal history of antineoplastic chemotherapy: Secondary | ICD-10-CM | POA: Diagnosis not present

## 2016-11-18 DIAGNOSIS — C781 Secondary malignant neoplasm of mediastinum: Secondary | ICD-10-CM | POA: Diagnosis not present

## 2016-11-19 ENCOUNTER — Telehealth: Payer: Self-pay | Admitting: Oncology

## 2016-11-19 ENCOUNTER — Ambulatory Visit
Admission: RE | Admit: 2016-11-19 | Discharge: 2016-11-19 | Disposition: A | Discharge status: Home / Self Care | Payer: BLUE CROSS/BLUE SHIELD | Source: Ambulatory Visit | Attending: Radiation Oncology | Admitting: Radiation Oncology

## 2016-11-19 ENCOUNTER — Ambulatory Visit (HOSPITAL_BASED_OUTPATIENT_CLINIC_OR_DEPARTMENT_OTHER): Payer: BLUE CROSS/BLUE SHIELD | Admitting: Oncology

## 2016-11-19 ENCOUNTER — Other Ambulatory Visit (HOSPITAL_BASED_OUTPATIENT_CLINIC_OR_DEPARTMENT_OTHER): Payer: BLUE CROSS/BLUE SHIELD

## 2016-11-19 VITALS — BP 146/90 | HR 115 | Temp 97.6°F | Resp 19 | Wt 91.1 lb

## 2016-11-19 DIAGNOSIS — G4700 Insomnia, unspecified: Secondary | ICD-10-CM | POA: Diagnosis not present

## 2016-11-19 DIAGNOSIS — C155 Malignant neoplasm of lower third of esophagus: Secondary | ICD-10-CM

## 2016-11-19 DIAGNOSIS — J910 Malignant pleural effusion: Secondary | ICD-10-CM

## 2016-11-19 DIAGNOSIS — C7801 Secondary malignant neoplasm of right lung: Secondary | ICD-10-CM

## 2016-11-19 DIAGNOSIS — R609 Edema, unspecified: Secondary | ICD-10-CM | POA: Diagnosis not present

## 2016-11-19 DIAGNOSIS — Z510 Encounter for antineoplastic radiation therapy: Secondary | ICD-10-CM | POA: Diagnosis not present

## 2016-11-19 DIAGNOSIS — R52 Pain, unspecified: Secondary | ICD-10-CM | POA: Diagnosis not present

## 2016-11-19 LAB — MAGNESIUM: MAGNESIUM: 2.2 mg/dL (ref 1.5–2.5)

## 2016-11-19 LAB — CBC WITH DIFFERENTIAL/PLATELET
BASO%: 0.6 % (ref 0.0–2.0)
BASOS ABS: 0 10*3/uL (ref 0.0–0.1)
EOS ABS: 0 10*3/uL (ref 0.0–0.5)
EOS%: 0.1 % (ref 0.0–7.0)
HEMATOCRIT: 34 % — AB (ref 38.4–49.9)
HEMOGLOBIN: 11.3 g/dL — AB (ref 13.0–17.1)
LYMPH%: 11.4 % — ABNORMAL LOW (ref 14.0–49.0)
MCH: 27.9 pg (ref 27.2–33.4)
MCHC: 33.3 g/dL (ref 32.0–36.0)
MCV: 83.6 fL (ref 79.3–98.0)
MONO#: 0.5 10*3/uL (ref 0.1–0.9)
MONO%: 8.2 % (ref 0.0–14.0)
NEUT#: 5.2 10*3/uL (ref 1.5–6.5)
NEUT%: 79.7 % — ABNORMAL HIGH (ref 39.0–75.0)
PLATELETS: 300 10*3/uL (ref 140–400)
RBC: 4.07 10*6/uL — ABNORMAL LOW (ref 4.20–5.82)
RDW: 14.5 % (ref 11.0–14.6)
WBC: 6.5 10*3/uL (ref 4.0–10.3)
lymph#: 0.7 10*3/uL — ABNORMAL LOW (ref 0.9–3.3)

## 2016-11-19 LAB — COMPREHENSIVE METABOLIC PANEL
ALBUMIN: 2.9 g/dL — AB (ref 3.5–5.0)
ALK PHOS: 149 U/L (ref 40–150)
ALT: 26 U/L (ref 0–55)
ANION GAP: 8 meq/L (ref 3–11)
AST: 27 U/L (ref 5–34)
BILIRUBIN TOTAL: 0.5 mg/dL (ref 0.20–1.20)
BUN: 9.1 mg/dL (ref 7.0–26.0)
CALCIUM: 10.4 mg/dL (ref 8.4–10.4)
CO2: 26 mEq/L (ref 22–29)
Chloride: 97 mEq/L — ABNORMAL LOW (ref 98–109)
Creatinine: 0.8 mg/dL (ref 0.7–1.3)
Glucose: 118 mg/dl (ref 70–140)
POTASSIUM: 3.5 meq/L (ref 3.5–5.1)
Sodium: 131 mEq/L — ABNORMAL LOW (ref 136–145)
Total Protein: 8.5 g/dL — ABNORMAL HIGH (ref 6.4–8.3)

## 2016-11-19 MED ORDER — ZOLPIDEM TARTRATE 5 MG PO TABS: 5.0000 mg | ORAL_TABLET | Freq: Every evening | ORAL | 0 refills | Status: AC | PRN

## 2016-11-19 NOTE — Progress Notes (Signed)
[facility] OFFICE PROGRESS NOTE   Diagnosis: esophagus cancer  INTERVAL HISTORY:   Mr. Masoud returns with his daughter. She reports he is felt better over the past few days. He has less facial swelling. The cough has improved. He continues to have pain when eating solids. He saw Dr. Lisbeth Renshaw and began pelvic radiation to the chest lymphadenopathy.  Objective:  Vital signs in last 24 hours:  Blood pressure (!) 146/90, pulse (!) 115, temperature 97.6 F (36.4 C), temperature source Oral, resp. rate 19, weight 91 lb 1.6 oz (41.3 kg), SpO2 94 %.    HEENT: firm fullness in the left greater than right supraclavicular fossa Resp: decreased breath sounds over the right posterior chest, no respiratory distress Cardio: regular rate and rhythm GI: no hepatosplenomegaly Vascular: no leg edema   Lab Results:  Lab Results  Component Value Date   WBC 6.5 11/19/2016   HGB 11.3 (L) 11/19/2016   HCT 34.0 (L) 11/19/2016   MCV 83.6 11/19/2016   PLT 300 11/19/2016   NEUTROABS 5.2 11/19/2016     Medications: I have reviewed the patient's current medications.  Assessment/Plan: 1. Squamous cell carcinoma. Staging CT scans of the chest, abdomen and pelvis on 06/02/2012 negative for evidence of metastatic disease. Endoscopic ultrasound 06/11/2012 confirmed a uT3 uN1 tumor at 40 cm from the incisors. He began radiation 06/15/2012. He began weekly Taxol/carboplatin chemotherapy 06/16/2012. He completed 6 weekly chemotherapy treatments. The carboplatin was held with week 5 due to thrombocytopenia. He completed the sixth weekly treatment with Taxol and carboplatin on 07/23/2012. He completed radiation on 07/29/2012 -he underwent an esophagectomy on 08/26/2012 with the pathology confirming a ypT3,ypN0 squamous cell carcinoma with perineural invasion identified.  2. History of solid/liquid dysphagia secondary to the obstructing esophagus mass.  3. Reflux. He is taking  Protonix. 4. Hospitalization 07/19/2014 with abdominal pain and vomiting. CT scan showed a mid small bowel obstruction without discrete mass or evident etiology. Small bowel obstruction felt to be secondary to adhesions. 5. Renal failure during the 07/19/2014 hospitalization felt to be contrast-induced nephropathy. 6. Chest CT 06/15/2013 with postoperative changes from esophagectomy and gastric pull-through. 7 mm posterior subpleural nodule in the right lower lobe. Followup CT scan 12/16/2013 revealed an increase in the size of a right hilar node and right upper lobe nodule.  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  Chest CT 12/23/2014 with enlarged right hilar lymph node measuring 1.2 cm, previously 0.8 cm. Slight enlargement of a periaortic lymph node, 8 mm as compared to 7 mm on the previous study. Increased thickening of the right pleural rind. Moderate sized right pleural effusion.  Chest x-ray 11/28/2015, no change in right pleural effusion  Chest x-ray 04/18/2016 with increase in the right pleural effusion.  Thoracentesis 04/24/2016-only 25 mL of pleural fluid could be obtained  CT chest 04/24/2016-progressive mediastinal/hilar lymphadenopathy, large right pleural effusion with mixed density, interstitial thickening in the  right lower lung  concerning for lymphatic tumor spread  Salvage chemotherapy with weekly Taxol/carboplatin initiated 04/30/2016  Cycle 2 weekly Taxol/carboplatin 06/11/2016(chemotherapy dose reduced due to neutropenia); week 3 held on 06/25/2016 due to neutropenia.   Schedule adjusted to every other week beginning 07/02/2016  07/02/2016 he developed nausea/vomiting, headache and facial flushing during the carboplatin infusion   Carboplatin eliminated from the regimen beginning 07/16/2016  CT chest 07/29/2016-mildly improved mediastinal lymphadenopathy,persistent right pleural effusion, possible new left lower lobe nodules  PD-L1 10%  Cycle 1 Pembrolizumab 08/23/2016  Cycle 2 Pembrolizumab 09/09/2016  Cycle 3 Pembrolizumab11/14/2017  Cycle 4 Pembrolizumab 10/22/2016  CT 11/06/2016-progressive chest lymphadenopathy with new SVC obstruction, new lung nodules, progressive right lung consolidation   Palliative radiation to chest lymphadenopathy beginning 11/19/2016  Weekly irinotecan/cisplatin beginning 11/22/2016 7. Headaches. Negative brain CT 12/23/2014.     Disposition:  The facial swelling appears slightly improved today. He began palliative radiation today. Mr. Gloster would like to proceed with palliative chemotherapy. He will begin irinotecan/cisplatin on a weekly schedule 11/22/2016. He will return for an office visit on 11/29/2016. He will try Ambien for insomnia.   Betsy Coder, MD  11/19/2016  6:43 PM

## 2016-11-19 NOTE — Telephone Encounter (Signed)
Appointments scheduled per 1/2 LOS. Patient given AVS report and calendars with future scheduled appointments. °

## 2016-11-20 ENCOUNTER — Telehealth: Payer: Self-pay | Admitting: Oncology

## 2016-11-20 ENCOUNTER — Telehealth: Payer: Self-pay | Admitting: *Deleted

## 2016-11-20 ENCOUNTER — Ambulatory Visit
Admission: RE | Admit: 2016-11-20 | Discharge: 2016-11-20 | Disposition: A | Discharge status: Home / Self Care | Payer: BLUE CROSS/BLUE SHIELD | Source: Ambulatory Visit | Attending: Radiation Oncology | Admitting: Radiation Oncology

## 2016-11-20 DIAGNOSIS — Z510 Encounter for antineoplastic radiation therapy: Secondary | ICD-10-CM | POA: Diagnosis not present

## 2016-11-20 NOTE — Telephone Encounter (Signed)
Per staff message I have scheduled appts and notified the scheduler to move other appts

## 2016-11-20 NOTE — Telephone Encounter (Signed)
Left message for registration to update demographics as per main number on file is not a working number. Also gave Vaughan Basta at registration a copy of the appointment schedule to give patient.

## 2016-11-20 NOTE — Telephone Encounter (Signed)
Called patient to confirm appointments. Number on file is NOT IN SERVICE. UNABLE TO LEAVE VOICE MAIL/MESSAGE. 11/20/16

## 2016-11-21 ENCOUNTER — Ambulatory Visit
Admission: RE | Admit: 2016-11-21 | Discharge: 2016-11-21 | Disposition: A | Discharge status: Home / Self Care | Payer: BLUE CROSS/BLUE SHIELD | Source: Ambulatory Visit | Attending: Radiation Oncology | Admitting: Radiation Oncology

## 2016-11-21 DIAGNOSIS — Z510 Encounter for antineoplastic radiation therapy: Secondary | ICD-10-CM | POA: Diagnosis not present

## 2016-11-22 ENCOUNTER — Ambulatory Visit
Admission: RE | Admit: 2016-11-22 | Discharge: 2016-11-22 | Disposition: A | Discharge status: Home / Self Care | Payer: BLUE CROSS/BLUE SHIELD | Source: Ambulatory Visit | Attending: Radiation Oncology | Admitting: Radiation Oncology

## 2016-11-22 ENCOUNTER — Encounter: Payer: Self-pay | Admitting: Radiation Oncology

## 2016-11-22 ENCOUNTER — Ambulatory Visit (HOSPITAL_BASED_OUTPATIENT_CLINIC_OR_DEPARTMENT_OTHER): Payer: BLUE CROSS/BLUE SHIELD

## 2016-11-22 VITALS — BP 103/71 | HR 136 | Temp 97.8°F | Resp 20

## 2016-11-22 VITALS — BP 92/62 | HR 144 | Temp 97.9°F | Resp 20 | Wt 89.2 lb

## 2016-11-22 DIAGNOSIS — C7801 Secondary malignant neoplasm of right lung: Secondary | ICD-10-CM

## 2016-11-22 DIAGNOSIS — C155 Malignant neoplasm of lower third of esophagus: Secondary | ICD-10-CM | POA: Diagnosis not present

## 2016-11-22 DIAGNOSIS — Z5111 Encounter for antineoplastic chemotherapy: Secondary | ICD-10-CM | POA: Diagnosis not present

## 2016-11-22 DIAGNOSIS — Z510 Encounter for antineoplastic radiation therapy: Secondary | ICD-10-CM | POA: Diagnosis not present

## 2016-11-22 DIAGNOSIS — C781 Secondary malignant neoplasm of mediastinum: Secondary | ICD-10-CM

## 2016-11-22 MED ORDER — NYSTATIN 100000 UNIT/ML MT SUSP: 5.0000 mL | Freq: Four times a day (QID) | OROMUCOSAL | 0 refills | Status: AC

## 2016-11-22 MED ORDER — IRINOTECAN HCL CHEMO INJECTION 100 MG/5ML
65.0000 mg/m2 | Freq: Once | INTRAVENOUS | Status: AC
  Administered 2016-11-22: 80 mg via INTRAVENOUS
  Filled 2016-11-22: qty 4

## 2016-11-22 MED ORDER — SODIUM CHLORIDE 0.9 % IV SOLN
Freq: Once | INTRAVENOUS | Status: AC
  Administered 2016-11-22: 10:00:00 via INTRAVENOUS

## 2016-11-22 MED ORDER — PALONOSETRON HCL INJECTION 0.25 MG/5ML
0.2500 mg | Freq: Once | INTRAVENOUS | Status: AC
  Administered 2016-11-22: 0.25 mg via INTRAVENOUS

## 2016-11-22 MED ORDER — BIAFINE EX EMUL
Freq: Every day | CUTANEOUS | Status: DC
  Administered 2016-11-22: 1 via TOPICAL

## 2016-11-22 MED ORDER — PALONOSETRON HCL INJECTION 0.25 MG/5ML
INTRAVENOUS | Status: AC
  Filled 2016-11-22: qty 5

## 2016-11-22 MED ORDER — SODIUM CHLORIDE 0.9 % IV SOLN
Freq: Once | INTRAVENOUS | Status: AC
  Administered 2016-11-22: 13:00:00 via INTRAVENOUS
  Filled 2016-11-22: qty 5

## 2016-11-22 MED ORDER — SODIUM CHLORIDE 0.9 % IV SOLN
30.0000 mg/m2 | Freq: Once | INTRAVENOUS | Status: AC
  Administered 2016-11-22: 41 mg via INTRAVENOUS
  Filled 2016-11-22: qty 41

## 2016-11-22 MED ORDER — EMOLLIENT BASE EX CREA: TOPICAL_CREAM | CUTANEOUS | 0 refills | Status: AC | PRN

## 2016-11-22 MED ORDER — DEXTROSE-NACL 5-0.45 % IV SOLN
Freq: Once | INTRAVENOUS | Status: AC
  Administered 2016-11-22: 10:00:00 via INTRAVENOUS
  Filled 2016-11-22: qty 10

## 2016-11-22 NOTE — Progress Notes (Signed)
   Department of Radiation Oncology  Phone:  [phone] Fax:        [phone]  Weekly Treatment Note    Name: Bruce [name] Date: 11/22/2016 MRN: [id number] DOB: Jul 31, 1961   Diagnosis:     ICD-9-CM ICD-10-CM   1. Malignant neoplasm of lower third of esophagus ([place]) 150.5 C15.5      Current dose: 10 Gy  Current fraction: 4   MEDICATIONS: Current Outpatient Prescriptions  Medication Sig Dispense Refill  . albuterol (PROVENTIL HFA;VENTOLIN HFA) 108 (90 Base) MCG/ACT inhaler Inhale 2 puffs into the lungs every 6 (six) hours as needed for wheezing or shortness of breath. 1 Inhaler 1  . benzonatate (TESSALON) 100 MG capsule TAKE 2 CAPSULES( 200 MG TOTAL) BY MOUTH THREE TIMES DAILY AS NEEDED FOR COUGH (Patient not taking: Reported on 11/14/2016) 90 capsule 1  . HYDROcodone-acetaminophen (NORCO/VICODIN) 5-325 MG tablet Take 1-2 tablets by mouth every 6 (six) hours as needed for moderate pain. (Patient not taking: Reported on 11/14/2016) 50 tablet 0  . Morphine Sulfate (MORPHINE CONCENTRATE) 10 mg / 0.5 ml concentrated solution Place 0.25-0.5 mLs (5-10 mg total) under the tongue every 4 (four) hours as needed for severe pain or shortness of breath. 30 mL 0  . pantoprazole (PROTONIX) 40 MG tablet Take 1 tablet (40 mg total) by mouth daily. 30 tablet 5  . Phenylephrine-Pheniramine-DM (THERAFLU COLD & COUGH PO) Take by mouth.    . polyethylene glycol (MIRALAX) packet Take 17 g by mouth daily. 30 each 0  . prochlorperazine (COMPAZINE) 10 MG tablet Take 1 tablet (10 mg total) by mouth every 6 (six) hours as needed for nausea or vomiting. 30 tablet 1  . traMADol (ULTRAM) 50 MG tablet Take 1 tablet (50 mg total) by mouth every 6 (six) hours as needed. (Patient not taking: Reported on 11/14/2016) 60 tablet 0  . zolpidem (AMBIEN) 5 MG tablet Take 1 tablet (5 mg total) by mouth at bedtime as needed for sleep. 30 tablet 0   No current facility-administered medications for this encounter.        ALLERGIES: Carboplatin   LABORATORY DATA:  Lab Results  Component Value Date   WBC 6.5 11/19/2016   HGB 11.3 (L) 11/19/2016   HCT 34.0 (L) 11/19/2016   MCV 83.6 11/19/2016   PLT 300 11/19/2016   Lab Results  Component Value Date   NA 131 (L) 11/19/2016   K 3.5 11/19/2016   CL 100 07/26/2014   CO2 26 11/19/2016   Lab Results  Component Value Date   ALT 26 11/19/2016   AST 27 11/19/2016   ALKPHOS 149 11/19/2016   BILITOT 0.50 11/19/2016     NARRATIVE: Bruce [name] was seen today for weekly treatment management. The chart was checked and the patient's films were reviewed.  The patient is doing satisfactorily today. The treatment was quicker and was more tolerable today. He states that he is drinking fluids and a reasonable manner today still having some discomfort.  PHYSICAL EXAMINATION:   Thrush present on the tongue      ASSESSMENT: The patient is doing satisfactorily with treatment.  PLAN: We will continue with the patient's radiation treatment as planned.  A prescription for nystatin has been called in.

## 2016-11-22 NOTE — Patient Instructions (Addendum)
[name] Discharge Instructions for Patients Receiving Chemotherapy  Today you received the following chemotherapy agents: Irinotecan, Cisplatin   To help prevent nausea and vomiting after your treatment, we encourage you to take your nausea medication as prescribed.    If you develop nausea and vomiting that is not controlled by your nausea medication, call the clinic.   BELOW ARE SYMPTOMS THAT SHOULD BE REPORTED IMMEDIATELY:  *FEVER GREATER THAN 100.5 F  *CHILLS WITH OR WITHOUT FEVER  NAUSEA AND VOMITING THAT IS NOT CONTROLLED WITH YOUR NAUSEA MEDICATION  *UNUSUAL SHORTNESS OF BREATH  *UNUSUAL BRUISING OR BLEEDING  TENDERNESS IN MOUTH AND THROAT WITH OR WITHOUT PRESENCE OF ULCERS  *URINARY PROBLEMS  *BOWEL PROBLEMS  UNUSUAL RASH Items with * indicate a potential emergency and should be followed up as soon as possible.  Feel free to call the clinic you have any questions or concerns. The clinic phone number is [phone].  Please show the [place] at check-in to the Emergency Department and triage nurse.  Irinotecan injection ?y l thu?c g? IRINOTECAN l thu?c ha tr? li?u. N ???c dng ?? ?i?u tr? ung th? ru?t gi v ru?t th?ng (tr?c trng). Thu?c ny c th? ???c dng cho nh?ng m?c ?ch khc; hy h?i ng??i cung c?p d?ch v? y t? ho?c d??c s? c?a mnh, n?u qu v? c th?c m?c. (CC) NHN HI?U PH? BI?N: [place] Ti c?n ph?i bo cho ng??i cung c?p d?ch v? y t? c?a mnh ?i?u g tr??c khi dng thu?c ny? H? c?n bi?t li?u qu v? hi?n c b?t k? tnh tr?ng no sau ?y hay khng: -cc r?i lo?n v? mu -m?t n??c -tiu ch?y -nhi?m trng (??c bi?t l nhi?m virus, ch?ng h?n nh? th?y ??u, l? mi?ng, ho?c herpes) -b?nh gan -s? l??ng t? bo mu th?p, ch?ng h?n nh? s? l??ng b?ch c?u, ti?u c?u, ho?c h?ng c?u th?p -m?i x? tr? g?n ?y ho?c ?ang x? tr? -pha?n ??ng b?t th???ng ho??c di? ??ng v??i irinotecan, sorbitol, ho?c cc thu?c ha tr? li?u khc -pha?n  ??ng b?t th???ng ho??c di? ??ng v??i ca?c d??c ph?m kha?c, th?c ph?m, thu?c nhu?m, ho??c ch?t ba?o qua?n -?ang c thai ho??c ??nh co? thai -?ang cho con bu? Ti nn s? d?ng thu?c ny nh? th? no? Thu?c ny ?? truy?n vo t?nh m?ch. Thu?c ny ???c cho trong b?nh vi?n ho?c phng m?ch b?i chuyn vin y t? ???c hu?n luy?n ??c bi?t. Hy bn v?i bc s? nhi khoa c?a qu v? v? vi?c dng thu?c ny ? tr? em. C th? c?n ch?m [place] ??c bi?t. Qu li?u: N?u qu v? cho r?ng mnh ? dng qu nhi?u thu?c ny, th hy lin l?c v?i trung tm ki?m sot ch?t ??c ho?c phng c?p c?u ngay l?p t?c. L?U : Thu?c ny ch? dnh ring cho qu v?. Khng chia s? thu?c ny v?i nh?ng ng??i khc. N?u ti l? qun m?t li?u th sao? ?i?u quan tr?ng l khng nn b? l? li?u thu?c no. Hy lin l?c v?i bc s? ho?c [place] vin y t? c?a mnh, n?u qu v? khng th? gi? ?ng cu?c h?n khm. Nh?ng g c th? t??ng tc v?i thu?c ny? Khng ???c dng thu?c ny cng v?i b?t k? th? no sau ?y: -atazanavir -ketoconazole -cy [facility] (c? [facility]/cy n?c s?i/cy l?nh) Thu?c ny c?ng c th? t??ng tc v?i cc thu?c sau ?y: -dexamethasone -cc thu?c l?i ti?u -cc thu?c nhu?n trng -m?t s? thu?c dng ?? tr?  cc ch?ng co gi?t, ch?ng h?n nh? carbamazepine, mephobarbital, phenobarbital, phenytoin, primidone -m?t s? thu?c lm t?ng s? l??ng t? bo mu, ch?ng h?n nh? filgrastim, pegfilgrastim, sargramostim -prochlorperazine -cc thu?c ch?ng ng?a Dan[facility] sch ny c th? khng m t? ?? h?t cc t??ng tc c th? x?y ra. Hy ??a cho ng??i cung c?p d?ch v? y t? c?a mnh danh sch t?t c? cc thu?c, th?o d??c, cc thu?c khng c?n toa, ho?c cc ch? ph?m b? sung m qu v? dng. C?ng nn bo cho h? bi?t r?ng qu v? c ht thu?c, u?ng r??u, ho?c c s? d?ng ma ty tri php hay khng. Vi th? c th? t??ng tc v?i thu?c c?a qu v?. Ti c?n ph?i theo di ?i?u g trong khi dng thu?c ny? Qu v? s? ???c theo di ch?t ch? trong khi dng thu?c ny. Qu v? s? c?n  ph?i ?i lm cc xt nghi?m mu quan tr?ng trong th?i gian dng thu?c ny. Thu?c ny c th? lm cho qu v? c?m th?y khng ???c kh?e nh? th??ng l?. ?i?u ny khng ph?i khng ph? bi?n, b?i v thu?c ha tr? li?u c th? ?nh h??ng ??n c? t? bo lnh l?n t? bo ung th?. Hy t??ng trnh m?i tc d?ng ph?. Hy ti?p t?c ??t ?i?u tr? c?a mnh ngay c? khi qu v? c?m th?y m?t, tr? khi bc s? yu c?u qu v? ng?ng ?i?u tr?Row[place] Robert m?t s? tr??ng h?p, qu v? c th? ???c cho dng cc thu?c ph? thm ?? gip gi?m tc d?ng ph?. Hy lm theo t?t c? cc h??ng d?n v? vi?c s? d?ng chng. Qu v? c th? b? bu?n ng? ho?c chng m?t. Khng ???c li xe, s? d?ng my mc, ho?c lm nh?ng vi?c c?n ph?i t?nh to cho t?i khi qu v? bi?t ???c thu?c ny ?nh h??ng ln qu v? nh? th? no. Khng ???c ng?i d?y ho?c ??ng d?y nhanh, ??c bi?t l khi qu v? l b?nh nhn l?n tu?i. ?i?u ny lm gi?m nguy c? b? chng m?t ho?c ng?t x?u. Hy h?i  ki?n bc s? ho?c chuyn vin y t?, n?u qu v? b? s?t, ?n l?nh ho?c ?au h?ng, ho?c c cc tri?u ch?ng khc c?a c?m l?nh ho?c cm. Khng ???c t? ?i?u tr? cho mnh. Thu?c ny lm gi?m kh? n?ng ch?ng l?i cc b?nh nhi?m trng c?a c? th?. Hy c? trnh ? g?n nh?ng ng??i b? b?nh. Thu?c ny c th? lm t?ng nguy c? b? b?m tm ho?c ch?y mu. Hy lin l?c v?i bc s? ho?c chuyn vin y t?, n?u qu v? th?y ch?y mu b?t th??ng. Hy c?n th?n khi ?nh r?ng ho?c x?a r?ng b?ng ch? nha khoa ho?c b?ng t?m, b?i v qu v? c th? d? b? nhi?m trng ho?c d? b? ch?y mu h?n. N?u qu v? c ?i lm r?ng, th hy bo v?i nha s? r?ng qu v? ?ang dng thu?c ny. Trnh dng cc thu?c c ch?a aspirin, acetaminophen, ibuprofen, naproxen, ho?c ketoprofen, tr? khi ? ???c bc s? ch? d?n. Cc thu?c ny c th? che l?p tri?u ch?ng s?t. Khng ???c ?? c thai trong khi dng thu?c ny. Ph? n? c?n ph?i thng bo cho bc s? c?a mnh, n?u mu?n c thai ho?c ngh? r?ng c th? mnh ? c [place]. C nguy c? v? cc tc d?ng ph? nghim tr?ng ??i v?i [place] nhi. Hy th?o lu?n  v?i bc s? ho?c chuyn vin y t? ho?c d??c s? ?? bi?t thm thng tin. Khng ???c nui  con b?ng s?a m? trong khi dng thu?c ny. Ti c th? nh?n th?y nh?ng tc d?ng ph? no khi dng thu?c ny? Nh?ng tc d?ng ph? qu v? c?n ph?i bo cho bc s? ho?c chuyn vin y t? cng s?m cng t?t: -cc ph?n ?ng d? ?ng, ch?ng h?n nh? da b? m?n ??, ng?a, n?i my ?ay, s?ng ? m?t, mi, ho?c l??i -gi?m s? l??ng t? bo mu - thu?c ny c th? lm gi?m s? l??ng t? bo b?ch c?u, t? bo h?ng c?u v ti?u c?u. Qu v? c th? c nguy c? cao b? nhi?m trng v xu?t huy?t. -cc d?u hi?u nhi?m trng - s?t ho?c ?n l?nh, ho, ?au h?ng, kh ?i ti?u ho?c ?i ti?u ?au -cc d?u hi?u gi?m ti?u c?u ho?c xu?t huy?t - b?m tm, cc n?t l?m t?m ?? trn da, phn c mu ?en, mu h?c n, c mu trong n??c ti?u -cc d?u hi?u gi?m s? l??ng t? bo h?ng c?u - c?m th?y y?u ?t ho?c m?t m?i m?t cch b?t th??ng, cc c?n ng?t x?u, chong vng -cc v?n ?? v? h h?p -?au ng?c -tiu ch?y -c?m th?y chong vng, ng?t x?u, b? t -?? b?ng da, s? m?i, v m? hi trong lc truy?n thu?c -?au ho?c l? mi?ng -?au, s?ng, ?? ho?c kch ?ng ? ch? tim -?au, s?ng ho?c c?m th?y nng ? chn -?au, t ho?c c?m gic nh? b? ki?n b ? bn tay ho?c bn chn -cc v?n ?? v? gi? th?ng b?ng, ni n?ng, ?i l?i -?au b?ng ho?c qu?n b?ng -kh ?i ti?u ho?c thay ??i l??ng n??c ti?u ???c bi ti?t -i m?a v khng th? ch?u ???c ?? u?ng v th?c ?n -vng da ho?c m?t Cc tc d?ng ph? khng c?n ph?i ch?m [place] y t? (hy bo cho bc s? ho?c chuyn vin y t?, n?u cc tc d?ng ph? ny ti?p di?n ho?c gy phi?n toi): -to bn -r?ng tc -?au ??u -m?t c?m gic ngon mi?ng -bu?n i ho?c i m?a -kh ch?u ? bao t? Danh sch ny c th? khng m t? ?? h?t cc tc d?ng ph? c th? x?y ra. Xin g?i t?i bc s? c?a mnh ?? ???c c? v?n chuyn mn v? cc tc d?ng ph?Sander Nephew v? c th? t??ng trnh cc tc d?ng ph? cho FDA theo s? [phone]. Ti nn c?t gi? thu?c c?a mnh ? ?u? Thu?c ny ???c s? d?ng b?i  chuyn vin y t? ? b?nh vi?n ho?c ? phng m?ch. Qu v? s? khng ???c c?p thu?c ny ?? c?t gi? t?i nh. L?U : ?y l b?n tm t?t. N c th? khng bao hm t?t c? thng tin c th? c. N?u qu v? th?c m?c v? thu?c ny, xin trao ??i v?i bc s?, d??c s?, ho?c ng??i cung c?p d?ch v? y t? c?a mnh.  2017 Elsevier/Gold Standard (2009-10-06 00:00:00) Cisplatin injection ?y l thu?c g? CISPLATIN l thu?c ha tr? li?u. N nh?m ??n cc t? bo phn chia nhanh, nh? cc t? bo ung th?, v lm cho cc t? bo ? ch?t. Thu?c ny ???c dng ?? ?i?u tr? nhi?u lo?i ung th?, ch?ng h?n nh? ung th? b?ng ?i, ung th? bu?ng tr?ng, v [place] th? tinh hon. Thu?c ny c th? ???c dng cho nh?ng m?c ?ch khc; hy h?i ng??i cung c?p d?ch v? y t? ho?c d??c s? c?a mnh, n?u qu v? c th?c m?c. (CC) NHN HI?U PH? BI?N: Platinol, Platinol -AQ Ti c?n ph?i bo cho ng??i cung  c?p d?ch v? y t? c?a mnh ?i?u g tr??c khi dng thu?c ny? H? c?n bi?t li?u qu v? hi?n c b?t k? tnh tr?ng no sau ?y hay khng: -cc r?i lo?n v? mu -cc v?n ?? v? thnh l?c -b?nh th?n -m?i x? tr? g?n ?y ho?c ?ang x? tr? -pha?n ??ng b?t th???ng ho??c di? ??ng v??i cisplatin, carboplatin, ho?c thu?c ha tr? li?u khc -pha?n ??ng b?t th???ng ho??c di? ??ng v??i ca?c d??c ph?m kha?c, th?c ph?m, thu?c nhu?m, ho??c ch?t ba?o qua?n -?ang c thai ho??c ??nh co? thai -?ang cho con bu? Ti nn s? d?ng thu?c ny nh? th? no? Thu?c ny ?? truy?n vo t?nh m?ch. Thu?c ny ???c cho trong b?nh vi?n ho?c phng m?ch b?i chuyn vin y t? ???c hu?n luy?n ??c bi?t. Hy bn v?i bc s? nhi khoa c?a qu v? v? vi?c dng thu?c ny ? tr? em. C th? c?n ch?m [place] ??c bi?t. Qu li?u: N?u qu v? cho r?ng mnh ? dng qu nhi?u thu?c ny, th hy lin l?c v?i trung tm ki?m sot ch?t ??c ho?c phng c?p c?u ngay l?p t?c. L?U : Thu?c ny ch? dnh ring cho qu v?. Khng chia s? thu?c ny v?i nh?ng ng??i khc. N?u ti l? qun m?t li?u th sao? ?i?u quan tr?ng l khng nn b?  l? li?u thu?c no. Hy lin l?c v?i bc s? ho?c [place] vin y t? c?a mnh, n?u qu v? khng th? gi? ?ng cu?c h?n khm. Nh?ng g c th? t??ng tc v?i thu?c ny? -dofetilide -foscarnet -cc thu?c dng cho cc ch?ng co gi?t (kinh phong) -m?t s? thu?c lm t?ng s? l??ng t? bo mu, ch?ng h?n nh? filgrastim, pegfilgrastim, sargramostim -probenecid -pyridoxine ???c s? d?ng cng v?i altretamine -rituximab -m?t s? thu?c khng sinh, ch?ng h?n nh? amikacin, gentamicin, neomycin, polymyxin B, streptomycin, tobramycin -sulfinpyrazone -cc thu?c ch?ng ng?a -zalcitabine Hy bo cho bc s? ho?c chuyn vin y t? c?a mnh tr??c khi dng b?t k? thu?c no trong s? nh?ng thu?c ny: -acetaminophen -aspirin -ibuprofen -ketoprofen -naproxen Danh sch ny c th? khng m t? ?? h?t cc t??ng tc c th? x?y ra. Hy ??a cho ng??i cung c?p d?ch v? y t? c?a mnh danh sch t?t c? cc thu?c, th?o d??c, cc thu?c khng c?n toa, ho?c cc ch? ph?m b? sung m qu v? dng. C?ng nn bo cho h? bi?t r?ng qu v? c ht thu?c, u?ng r??u, ho?c c s? d?ng ma ty tri php hay khng. Vi th? c th? t??ng tc v?i thu?c c?a qu v?. Ti c?n ph?i theo di ?i?u g trong khi dng thu?c ny? Qu v? s? ???c theo di ch?t ch? trong khi dng thu?c ny. Qu v? s? c?n ph?i ?i lm cc xt nghi?m mu quan tr?ng trong th?i gian dng thu?c ny. Thu?c ny c th? lm cho qu v? c?m th?y khng ???c kh?e nh? th??ng l?. ?i?u ny khng ph?i khng ph? bi?n, b?i v thu?c ha tr? li?u c th? ?nh h??ng ??n c? t? bo lnh l?n t? bo ung th?. Hy t??ng trnh m?i tc d?ng ph?. Hy ti?p t?c ??t ?i?u tr? c?a mnh ngay c? khi qu v? c?m th?y m?t, tr? khi bc s? yu c?u qu v? ng?ng ?i?u tr?[place] m?t s? tr??ng h?p, qu v? c th? ???c cho dng cc thu?c ph? thm ?? gip gi?m tc d?ng ph?. Hy lm theo t?t c? cc h??ng d?n v? vi?c s? d?ng chng. Hy h?i  ki?n bc s?  ho?c chuyn vin y t?, n?u qu v? b? s?t, ?n l?nh ho?c ?au h?ng, ho?c c cc tri?u ch?ng khc c?a c?m  l?nh ho?c cm. Khng ???c t? ?i?u tr? cho mnh. Thu?c ny lm gi?m kh? n?ng ch?ng l?i cc b?nh nhi?m trng c?a c? th?. Hy c? trnh ? g?n nh?ng ng??i b? b?nh. Thu?c ny c th? lm t?ng nguy c? b? b?m tm ho?c ch?y mu. Hy lin l?c v?i bc s? ho?c chuyn vin y t?, n?u qu v? th?y ch?y mu b?t th??ng. Hy c?n th?n khi ?nh r?ng ho?c x?a r?ng b?ng ch? nha khoa ho?c b?ng t?m, b?i v qu v? c th? d? b? nhi?m trng ho?c d? b? ch?y mu h?n. N?u qu v? c ?i lm r?ng, th hy bo v?i nha s? r?ng qu v? ?ang dng thu?c ny. Trnh dng cc thu?c c ch?a aspirin, acetaminophen, ibuprofen, naproxen, ho?c ketoprofen, tr? khi ? ???c bc s? ch? d?n. Cc thu?c ny c th? che l?p tri?u ch?ng s?t. Khng ???c ?? c thai trong khi dng thu?c ny. Ph? n? c?n ph?i thng bo cho bc s? c?a mnh, n?u mu?n c thai ho?c ngh? r?ng c th? mnh ? c [place]. C nguy c? v? cc tc d?ng ph? nghim tr?ng ??i v?i [place] nhi. Hy th?o lu?n v?i bc s? ho?c chuyn vin y t? ho?c d??c s? ?? bi?t thm thng tin. Khng ???c nui con b?ng s?a m? trong khi dng thu?c ny. Hy u?ng cc ch?t l?ng nh? ? ???c ch? d?n trong khi qu v? dng thu?c ny. ?i?u ny s? gip b?o v? th?n. Hy lin l?c v?i bc s? ho?c chuyn vin y t? n?u qu v? b? tiu ch?y. Khng ???c t? ?i?u tr? cho mnh. Ti c th? nh?n th?y nh?ng tc d?ng ph? no khi dng thu?c ny? Nh?ng tc d?ng ph? qu v? c?n ph?i bo cho bc s? ho?c chuyn vin y t? cng s?m cng t?t: -cc ph?n ?ng d? ?ng, ch?ng h?n nh? da b? m?n ??, ng?a, n?i my ?ay, s?ng ? m?t, mi, ho?c l??i -cc d?u hi?u nhi?m trng - s?t ho?c ?n l?nh, ho, ?au h?ng, kh ?i ti?u ho?c ?i ti?u ?au -cc d?u hi?u gi?m ti?u c?u ho?c xu?t huy?t - b?m tm, cc n?t l?m t?m ?? trn da, phn c mu ?en, mu h?c n, ch?y mu cam -cc d?u hi?u gi?m s? l??ng t? bo h?ng c?u - c?m th?y y?u ?t ho?c m?t m?i m?t cch b?t th??ng, cc c?n ng?t x?u, chong vng -cc v?n ?? v? h h?p -thay ??i thnh l?c -c?n ?au do b?nh th?ng phong (gout) -gi?m  s? l??ng t? bo mu - thu?c ny c th? lm gi?m s? l??ng t? bo b?ch c?u, t? bo h?ng c?u v ti?u c?u. Qu v? c th? c nguy c? cao b? nhi?m trng v xu?t huy?t. -bu?n i ho?c i m?a -?au, s?ng, ??, ho?c kch ?ng ? ch? tim -?au, t ho?c c?m gic nh? b? ki?n b ? bn tay ho?c bn chn -cc v?n ?? v? gi? th?ng b?ng, c? ??ng -kh ?i ti?u ho?c thay ??i l??ng n??c ti?u ???c bi ti?t Cc tc d?ng ph? khng c?n ph?i ch?m [place] y t? (hy bo cho bc s? ho?c chuyn vin y t?, n?u cc tc d?ng ph? ny ti?p di?n ho?c gy phi?n toi): -thay ??i th? l?c -m?t c?m gic ngon mi?ng -thay ??i v? gic, ho?c c v? kim lo?i trong mi?ng Danh sch ny c th?  khng m t? ?? h?t cc tc d?ng ph? c th? x?y ra. Xin g?i t?i bc s? c?a mnh ?? ???c c? v?n chuyn mn v? cc tc d?ng ph?Sander Nephew v? c th? t??ng trnh cc tc d?ng ph? cho FDA theo s? [phone]. Ti nn c?t gi? thu?c c?a mnh ? ?u? Thu?c ny ???c s? d?ng b?i chuyn vin y t? ? b?nh vi?n ho?c ? phng m?ch. Qu v? s? khng ???c c?p thu?c ny ?? c?t gi? t?i nh. L?U : ?y l b?n tm t?t. N c th? khng bao hm t?t c? thng tin c th? c. N?u qu v? th?c m?c v? thu?c ny, xin trao ??i v?i bc s?, d??c s?, ho?c ng??i cung c?p d?ch v? y t? c?a mnh.  2017 Elsevier/Gold Standard (2009-10-06 00:00:00)

## 2016-11-22 NOTE — Progress Notes (Signed)
Alerted Dr. Benay Spice of patient's BP of 79/44 and heart rate of 131. Instructed to proceed with pre-cisplatin hydration fluids and recheck and notify afterwards.   Wylene Simmer, BSN, RN 11/22/2016 9:45 AM

## 2016-11-22 NOTE — Progress Notes (Signed)
  Radiation Oncology         [phone] ________________________________  Name: Bruce [name] MRN: [id number]  Date: 11/15/2016  DOB: 16-Sep-1961  SIMULATION AND TREATMENT PLANNING NOTE  DIAGNOSIS:     ICD-9-CM ICD-10-CM   1. 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  Identity was confirmed.  All relevant records and images related to the planned course of therapy were reviewed.   Written consent to proceed with treatment was confirmed which was freely given after reviewing the details related to the planned course of therapy had been reviewed with the patient.  Then, the patient was set-up in a stable reproducible  supine position for radiation therapy.  CT images were obtained.  Surface markings were placed.     The CT images were loaded into the planning software.  Then the target and avoidance structures were contoured.  Treatment planning then occurred.  The radiation prescription was entered and confirmed.  A total of 4 complex treatment devices were fabricated which relate to the designed radiation treatment fields. Each of these customized fields/ complex treatment devices will be used on a daily basis during the radiation course. I have requested : 3D Simulation  I have requested a DVH of the following structures: Target volume, lungs, spinal cord, esophagus.   The patient will undergo daily image guidance to ensure accurate localization of the target, and adequate minimize dose to the normal surrounding structures in close proximity to the target.   PLAN:  The patient will receive 35 Gy in 14 fractions.  Special treatment procedure The patient will also receive concurrent chemotherapy during the treatment. The patient may therefore experience increased toxicity or side effects and the patient will be monitored for such problems. This may require extra lab work as  necessary. Additionally, this also represents a course of reirradiation as the patient has previously received radiation treatment to this region within the chest. This therefore constitutes a special treatment procedure.   ________________________________   Jodelle Gross, MD, PhD

## 2016-11-22 NOTE — Progress Notes (Signed)
Per Dr. Benay Spice, [place] to treat with heart rate of [place], BSN, RN 11/22/2016 12:19 PM

## 2016-11-22 NOTE — Progress Notes (Addendum)
BP (!) 97/50 (BP Location: Left Arm, Patient Position: Sitting, Cuff Size: Normal)   Pulse (!) 146   Temp 97.9 F (36.6 C) (Oral)   Resp 20   Wt 89 lb 3.2 oz (40.5 kg)   SpO2 97% Comment: room air  BMI 15.80 kg/m   Wt Readings from Last 3 Encounters:  11/22/16 89 lb 3.2 oz (40.5 kg)  11/19/16 91 lb 1.6 oz (41.3 kg)  11/14/16 91 lb (41.3 kg)  BP 92/62 (BP Location: Left Arm, Patient Position: Standing, Cuff Size: Normal)   Pulse (!) 144   Temp 97.9 F (36.6 C) (Oral)   Resp 20   Wt 89 lb 3.2 oz (40.5 kg)   SpO2 97% Comment: room air  BMI 15.80 kg/m   Patient  Lung cancer radatx 4 completed, patient c/o pain in upper left back shoulder, takes morphine prn, taste buds bad but swallowing okay,looks like thrush, MD in, to write rx at patient's pharmacy , pt education done, Gave biafine and radiatin therapy and you book, my business card, discussed ways to manage side effects, fatigue , skin irritation, diff swallowing,burning in throat, may need rx carafate , increase soft foods, protein in diet, high calories, boost or ensure prn, teach back with daughter and patient nodding yes 8:42 AM

## 2016-11-23 ENCOUNTER — Inpatient Hospital Stay (HOSPITAL_COMMUNITY)
Admission: EM | Admit: 2016-11-23 | Discharge: 2016-12-19 | DRG: 871 | Disposition: E | Payer: BLUE CROSS/BLUE SHIELD | Attending: Family Medicine | Admitting: Family Medicine

## 2016-11-23 ENCOUNTER — Other Ambulatory Visit: Payer: Self-pay

## 2016-11-23 ENCOUNTER — Encounter (HOSPITAL_COMMUNITY): Payer: Self-pay | Admitting: Emergency Medicine

## 2016-11-23 ENCOUNTER — Other Ambulatory Visit (HOSPITAL_COMMUNITY): Payer: Self-pay

## 2016-11-23 ENCOUNTER — Emergency Department (HOSPITAL_COMMUNITY): Payer: BLUE CROSS/BLUE SHIELD

## 2016-11-23 DIAGNOSIS — Z66 Do not resuscitate: Secondary | ICD-10-CM | POA: Diagnosis present

## 2016-11-23 DIAGNOSIS — R000 Tachycardia, unspecified: Secondary | ICD-10-CM | POA: Diagnosis present

## 2016-11-23 DIAGNOSIS — D72829 Elevated white blood cell count, unspecified: Secondary | ICD-10-CM

## 2016-11-23 DIAGNOSIS — R0603 Acute respiratory distress: Secondary | ICD-10-CM

## 2016-11-23 DIAGNOSIS — A419 Sepsis, unspecified organism: Principal | ICD-10-CM | POA: Diagnosis present

## 2016-11-23 DIAGNOSIS — C799 Secondary malignant neoplasm of unspecified site: Secondary | ICD-10-CM

## 2016-11-23 DIAGNOSIS — F419 Anxiety disorder, unspecified: Secondary | ICD-10-CM | POA: Diagnosis present

## 2016-11-23 DIAGNOSIS — E872 Acidosis, unspecified: Secondary | ICD-10-CM

## 2016-11-23 DIAGNOSIS — Z515 Encounter for palliative care: Secondary | ICD-10-CM | POA: Diagnosis present

## 2016-11-23 DIAGNOSIS — B348 Other viral infections of unspecified site: Secondary | ICD-10-CM

## 2016-11-23 DIAGNOSIS — D630 Anemia in neoplastic disease: Secondary | ICD-10-CM | POA: Diagnosis present

## 2016-11-23 DIAGNOSIS — C155 Malignant neoplasm of lower third of esophagus: Secondary | ICD-10-CM | POA: Diagnosis present

## 2016-11-23 DIAGNOSIS — D649 Anemia, unspecified: Secondary | ICD-10-CM | POA: Diagnosis present

## 2016-11-23 DIAGNOSIS — J398 Other specified diseases of upper respiratory tract: Secondary | ICD-10-CM | POA: Diagnosis present

## 2016-11-23 DIAGNOSIS — Z87891 Personal history of nicotine dependence: Secondary | ICD-10-CM

## 2016-11-23 DIAGNOSIS — I871 Compression of vein: Secondary | ICD-10-CM | POA: Diagnosis present

## 2016-11-23 DIAGNOSIS — Z79899 Other long term (current) drug therapy: Secondary | ICD-10-CM

## 2016-11-23 DIAGNOSIS — T451X5A Adverse effect of antineoplastic and immunosuppressive drugs, initial encounter: Secondary | ICD-10-CM | POA: Diagnosis present

## 2016-11-23 DIAGNOSIS — T17990A Other foreign object in respiratory tract, part unspecified in causing asphyxiation, initial encounter: Secondary | ICD-10-CM | POA: Diagnosis present

## 2016-11-23 DIAGNOSIS — C7800 Secondary malignant neoplasm of unspecified lung: Secondary | ICD-10-CM | POA: Diagnosis present

## 2016-11-23 DIAGNOSIS — T17590A Other foreign object in bronchus causing asphyxiation, initial encounter: Secondary | ICD-10-CM | POA: Diagnosis present

## 2016-11-23 DIAGNOSIS — C781 Secondary malignant neoplasm of mediastinum: Secondary | ICD-10-CM | POA: Diagnosis present

## 2016-11-23 DIAGNOSIS — Y95 Nosocomial condition: Secondary | ICD-10-CM | POA: Diagnosis present

## 2016-11-23 DIAGNOSIS — T17500A Unspecified foreign body in bronchus causing asphyxiation, initial encounter: Secondary | ICD-10-CM

## 2016-11-23 DIAGNOSIS — E871 Hypo-osmolality and hyponatremia: Secondary | ICD-10-CM | POA: Diagnosis present

## 2016-11-23 DIAGNOSIS — C159 Malignant neoplasm of esophagus, unspecified: Secondary | ICD-10-CM

## 2016-11-23 DIAGNOSIS — J9690 Respiratory failure, unspecified, unspecified whether with hypoxia or hypercapnia: Secondary | ICD-10-CM

## 2016-11-23 DIAGNOSIS — J189 Pneumonia, unspecified organism: Secondary | ICD-10-CM | POA: Diagnosis present

## 2016-11-23 DIAGNOSIS — I959 Hypotension, unspecified: Secondary | ICD-10-CM | POA: Diagnosis present

## 2016-11-23 DIAGNOSIS — B9789 Other viral agents as the cause of diseases classified elsewhere: Secondary | ICD-10-CM | POA: Diagnosis present

## 2016-11-23 DIAGNOSIS — C7802 Secondary malignant neoplasm of left lung: Secondary | ICD-10-CM | POA: Diagnosis present

## 2016-11-23 DIAGNOSIS — R069 Unspecified abnormalities of breathing: Secondary | ICD-10-CM | POA: Diagnosis not present

## 2016-11-23 DIAGNOSIS — D6481 Anemia due to antineoplastic chemotherapy: Secondary | ICD-10-CM | POA: Diagnosis present

## 2016-11-23 DIAGNOSIS — J90 Pleural effusion, not elsewhere classified: Secondary | ICD-10-CM

## 2016-11-23 DIAGNOSIS — J9601 Acute respiratory failure with hypoxia: Secondary | ICD-10-CM

## 2016-11-23 DIAGNOSIS — Z9049 Acquired absence of other specified parts of digestive tract: Secondary | ICD-10-CM

## 2016-11-23 DIAGNOSIS — R1313 Dysphagia, pharyngeal phase: Secondary | ICD-10-CM | POA: Diagnosis present

## 2016-11-23 DIAGNOSIS — J9811 Atelectasis: Secondary | ICD-10-CM | POA: Diagnosis present

## 2016-11-23 DIAGNOSIS — Z803 Family history of malignant neoplasm of breast: Secondary | ICD-10-CM

## 2016-11-23 DIAGNOSIS — J9809 Other diseases of bronchus, not elsewhere classified: Secondary | ICD-10-CM

## 2016-11-23 DIAGNOSIS — Z9889 Other specified postprocedural states: Secondary | ICD-10-CM

## 2016-11-23 DIAGNOSIS — J690 Pneumonitis due to inhalation of food and vomit: Secondary | ICD-10-CM | POA: Diagnosis present

## 2016-11-23 DIAGNOSIS — R0600 Dyspnea, unspecified: Secondary | ICD-10-CM | POA: Diagnosis not present

## 2016-11-23 DIAGNOSIS — C7801 Secondary malignant neoplasm of right lung: Secondary | ICD-10-CM | POA: Diagnosis present

## 2016-11-23 MED ORDER — SODIUM CHLORIDE 0.9 % IV BOLUS (SEPSIS)
500.0000 mL | Freq: Once | INTRAVENOUS | Status: AC
  Administered 2016-11-24: 500 mL via INTRAVENOUS

## 2016-11-23 MED ORDER — IPRATROPIUM BROMIDE 0.02 % IN SOLN
0.5000 mg | Freq: Once | RESPIRATORY_TRACT | Status: AC
  Administered 2016-11-24: 0.5 mg via RESPIRATORY_TRACT
  Filled 2016-11-23: qty 2.5

## 2016-11-23 MED ORDER — ALBUTEROL SULFATE (2.5 MG/3ML) 0.083% IN NEBU
5.0000 mg | INHALATION_SOLUTION | Freq: Once | RESPIRATORY_TRACT | Status: AC
  Administered 2016-11-24: 5 mg via RESPIRATORY_TRACT
  Filled 2016-11-23: qty 6

## 2016-11-23 NOTE — ED Notes (Signed)
Bed: ZD63 Expected date:  Expected time:  Means of arrival:  Comments: 55yo lung ca, shob

## 2016-11-23 NOTE — ED Provider Notes (Signed)
[facility] DEPT Provider Note   CSN: [id number] Arrival date & time: 11/26/2016  2239    By signing my name below, I, Macon Large, attest that this documentation has been prepared under the direction and in the presence of Alfonzo Beers, MD. Electronically Signed: Macon Large, ED Scribe. 12/09/2016. 11:43 PM.  History   Chief Complaint Chief Complaint  Patient presents with  . Respiratory Distress   The history is provided by the patient and a relative.   HPI Comments: Sutter Ahlgren is a 56 y.o. male with PMHx of esophagitis and lung cancer, brought in by ambulance who presents to the Emergency Department complaining of moderate, constant, SOB onset yesterday. Pt's relative notes he attempted to spit up some mucous, but states it "got stuck". Pt's relative reports associated wheezing. Pt was given a breathing treatment by EMS which provided minimal relief. Pt notes his SOB is exacerbated with laying down. Pt is currently on chemotherapy and radiation. He had his most recent chemotherapy session was yesterday. She notes pt's chemotherapy will continue for two more months. Per pt's relative, he has radiation sessions every day for 3 weeks that began a couple of days ago. Per nurse note, pt had cancer that began in the esophagus but has now spreaded to his lungs. She also notes Pt lost 10 lbs in a week. Denies fever, vomiting, diarrhea, muscle pains. She also denies hx of blood clots.    Past Medical History:  Diagnosis Date  . Cancer ([place]) 05/28/12   bx=esophagus=squamous cell carcinoma  . Dysphasia    solid and liquid  . Esophagitis   . Lung cancer ([place]) 12/27/13   right upper lung  . Mass of esophagus 05/28/2012   BX'D DISTAL ESOPHAGUS,PENDING  . Neuropathy ([place])    right hand numbness 1 year 2012    Patient Active Problem List   Diagnosis Date Noted  . Sepsis ([place]) 11/24/2016  . Hyponatremia 11/24/2016  . Normocytic anemia 11/24/2016  . Community acquired pneumonia  11/24/2016  . 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 Constipation 11/05/2016  . Dehydration 11/05/2016  . Hypersensitivity reaction 07/03/2016  . SBO (small bowel obstruction) 07/20/2014  . 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 COMPLETE ESOPHAGECTOMY  08/26/2012   Procedure: ESOPHAGECTOMY COMPLETE;  Surgeon: [name], MD;  Location: [facility] OR;  Service: Thoracic;  Laterality: N/A;  transhiatal   . 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 EUS  06/11/2012   Procedure: UPPER ENDOSCOPIC ULTRASOUND (EUS) RADIAL;  Surgeon: Milus Banister, MD;  Location: [facility] ENDOSCOPY;  Service: Endoscopy;  Laterality: N/A;  . JEJUNOSTOMY  08/26/2012   Procedure: JEJUNOSTOMY;  Surgeon: [name], MD;  Location: [place];  Service: Thoracic;  Laterality: N/A;  placement of feeding jujunostomy tube  . LYMPH NODE DISSECTION Right 01/31/2014   Procedure: LYMPH NODE DISSECTION;  Surgeon: [name], MD;  Location: [place];  Service: Thoracic;  Laterality: Right;  . PYLOROPLASTY  08/26/2012   Procedure: PYLOROPLASTY;  Surgeon: [name], MD;  Location: [facility];  Service: Thoracic;  Laterality: N/A;  . VIDEO ASSISTED THORACOSCOPY (VATS)/WEDGE RESECTION Right 01/31/2014   Procedure: VIDEO ASSISTED THORACOSCOPY (VATS)/WEDGE RESECTION; with insertion of On Q pain pump;  Surgeon: [name], MD;  Location: [place];  Service: Thoracic;  Laterality: Right;  with insertion of On Q pain pump  . VIDEO BRONCHOSCOPY  08/26/2012   Procedure: VIDEO BRONCHOSCOPY;  Surgeon: [name], MD;  Location: [place];  Service: Thoracic;  Laterality: N/A;  . VIDEO BRONCHOSCOPY N/A 01/31/2014   Procedure: VIDEO BRONCHOSCOPY;  Surgeon: [name], MD;  Location: [facility] OR;  Service:  Thoracic;  Laterality: N/A;       Home Medications    Prior to Admission medications   Medication Sig Start Date End Date Taking? Authorizing Provider  albuterol (PROVENTIL HFA;VENTOLIN HFA) 108 (90 Base) MCG/ACT inhaler Inhale 2 puffs into the lungs every 6 (six) hours as needed for wheezing or shortness of breath. 10/22/16  Yes Owens Shark, NP  emollient (BIAFINE) cream Apply topically as needed. 11/22/16  Yes [name] Pedro, PA-C  Morphine Sulfate (MORPHINE CONCENTRATE) 10 mg / 0.5 ml concentrated solution Place 0.25-0.5 mLs (5-10 mg total) under the tongue every 4 (four) hours as needed for severe pain or shortness of breath. 11/12/16  Yes Ladell Pier, MD  nystatin (MYCOSTATIN) 100000 UNIT/ML suspension Take 5 mLs (500,000 Units total) by mouth 4 (four) times daily. 11/22/16  Yes Kyung Rudd, MD  prochlorperazine (COMPAZINE) 10 MG tablet Take 1 tablet (10 mg total) by mouth every 6 (six) hours as needed for nausea or vomiting. 07/30/16  Yes Ladell Pier, MD  zolpidem (AMBIEN) 5 MG tablet Take 1 tablet (5 mg total) by mouth at bedtime as needed for sleep. 11/19/16  Yes Ladell Pier, MD  polyethylene glycol [facility]) packet Take 17 g by mouth daily. Patient not taking: Reported on 11/24/2016 05/07/16   Ladell Pier, MD    Family History Family History  Problem Relation Age of Onset  . Breast cancer Sister     Social History Social History  Substance Use Topics  . Smoking status: Former Smoker    Packs/day: 0.40    Years: 25.00    Types: Cigarettes    Quit date: 06/19/2011  . Smokeless tobacco: Former Systems developer    Quit date: 06/03/2007  . Alcohol use No     Comment: not currently (01/28/14)     Allergies   Carboplatin   Review of Systems Review of Systems  Constitutional: Negative for fever.  Respiratory: Positive for shortness of breath. Negative for wheezing.   Gastrointestinal: Negative for diarrhea and vomiting.  Musculoskeletal: Negative for myalgias.  All  other systems reviewed and are negative.    Physical Exam Updated Vital Signs BP 112/73   Pulse (!) 125   Temp (!) 96.5 F (35.8 C) (Axillary)   Resp 15   Ht '5\' 3"'$  (1.6 m)   Wt 95 lb 7.4 oz (43.3 kg)   SpO2 97%   BMI 16.91 kg/m  Vitals reviewed Physical Exam Physical Examination: General appearance - alert,ill appearing, in mild distress Mental status - alert, oriented to person, place, and time Eyes -no conjunctival injection, no scleral icterus Mouth - mucous membranes moist, pharynx normal without lesions Neck - supple, no significant adenopathy Chest - tachypneic, bilateral expiratory wheezing Heart - tachycardic, regular rhythm, normal S1, S2, no murmurs, rubs, clicks or gallops Abdomen - soft, nontender, nondistended, no masses or organomegaly Neurological - alert, oriented, normal speech Extremities - peripheral pulses normal, no pedal edema, no clubbing or cyanosis Skin - normal coloration and turgor, no rashes ED Treatments / Results   DIAGNOSTIC STUDIES: Oxygen Saturation is 100% on [place], normal by my interpretation.  COORDINATION OF CARE: 11:40 PM Discussed treatment plan with pt at bedside which includes labs, chest imaging and breathing treatments and pt agreed to plan.   Labs (all labs ordered are listed, but only abnormal results are displayed) Labs Reviewed  CBC WITH DIFFERENTIAL/PLATELET - Abnormal; Notable for the following:       Result Value   WBC 14.3 (*)    RBC 3.90 (*)    Hemoglobin 10.6 (*)    HCT 31.3 (*)    Neutro Abs 13.9 (*)    Lymphs Abs 0.2 (*)    All other components within normal limits  BASIC METABOLIC PANEL - Abnormal; Notable for the following:    Sodium 126 (*)    Chloride 92 (*)    Glucose, Bld 200 (*)    All other components within normal limits  URINALYSIS, ROUTINE W REFLEX MICROSCOPIC - Abnormal; Notable for the following:    Glucose, UA 100 (*)    Hgb urine dipstick SMALL (*)    All other components within normal  limits  LACTIC ACID, PLASMA - Abnormal; Notable for the following:    Lactic Acid, Venous 3.5 (*)    All other components within normal limits  BASIC METABOLIC PANEL - Abnormal; Notable for the following:    Sodium 127 (*)    Chloride 93 (*)    Glucose, Bld 156 (*)    Creatinine, Ser 0.60 (*)    All other components within normal limits  CBC - Abnormal; Notable for the following:    RBC 3.53 (*)    Hemoglobin 9.8 (*)    HCT 28.0 (*)    All other components within normal limits  I-STAT CG4 LACTIC ACID, ED - Abnormal; Notable for the following:    Lactic Acid, Venous 6.75 (*)    All other components within normal limits  MRSA PCR SCREENING  RESPIRATORY PANEL BY PCR  CULTURE, BLOOD (ROUTINE X 2)  CULTURE, BLOOD (ROUTINE X 2)  URINE CULTURE  BRAIN NATRIURETIC PEPTIDE  BLOOD GAS, ARTERIAL  URINALYSIS, MICROSCOPIC (REFLEX)  PROCALCITONIN  INFLUENZA PANEL BY PCR (TYPE A & B, H1N1)  STREP PNEUMONIAE URINARY ANTIGEN  LACTIC ACID, PLASMA  LEGIONELLA PNEUMOPHILA SEROGP 1 UR AG  SODIUM, URINE, RANDOM  OSMOLALITY, URINE  OSMOLALITY  I-STAT TROPOININ, ED    EKG  EKG Interpretation None       Radiology Ct Angio Chest Pe W Or Wo Contrast  Result Date: 11/24/2016 CLINICAL DATA:  Increased shortness of breath history of esophageal cancer with lung metastases EXAM: CT ANGIOGRAPHY CHEST WITH CONTRAST TECHNIQUE: Multidetector CT imaging of the chest was performed using the standard protocol during bolus administration of intravenous contrast. Multiplanar CT image reconstructions and MIPs were obtained to evaluate the vascular anatomy. CONTRAST:  80 mL Isovue 370 intravenous COMPARISON:  Chest x-ray 12/03/2016, CT chest 11/06/2016 FINDINGS: Cardiovascular: Non aneurysmal aorta. No dissection is seen. Numerous collateral vessels within the left chest wall and paraspinous area. Severe narrowing of the brachiocephalic vessels and SVC due to mediastinal mass. Suspect thrombus within the distal  SVC just above the right atrium. Severe diffuse narrowing/ encasement of the right pulmonary artery by mediastinal mass. Marked narrowing of right upper lobe pulmonary arteries with minimal distal enhancement noted. No gross embolus identified within the right lower lobe pulmonary arteries. Mild narrowing of the left pulmonary artery by a mediastinal mass. No gross central embolus identified on the left. Heart size nonenlarged. No gross effusion Mediastinum/Nodes: Ill-defined infiltrative soft tissue mass/adenopathy filling the  mediastinum. The mass surrounds the great vessels of the aortic arch and the trachea with irregular narrowing of the right bronchi. Mass encases the central right greater than left pulmonary arteries. Left hilar adenopathy is again visualized. Patient is status post esophagectomy with gastric pull-through. Multiple enlarged right axillary nodes. Lungs/Pleura: Large bilateral pleural effusions, increased compared to prior. Mild rim enhancement around the right pleural collection. Emphasis mid disc disease is present. Multiple lung nodules again visualized consistent with metastatic disease. Increasing consolidation within the right lower lobe. No pneumothorax Upper Abdomen: Postsurgical changes of the stomach. Heterogenous enhancement of the liver. Spleen grossly unremarkable. Musculoskeletal: Stable skeletal structures with patchy areas of sclerosis within lower thoracic vertebra. Patchy sclerosis within upper thoracic and lower cervical vertebra part of which is related to vascular enhancement. Review of the MIP images confirms the above findings. IMPRESSION: 1. No gross central embolus is visualized. There is severe narrowing of the right pulmonary artery by mediastinal mass with severe narrowing and attenuation of the right upper lobe pulmonary arterial branches. 2. Severe narrowing of the brachiocephalic vessels and SVC by mediastinal tumor. Suspect that there is a small amount of  thrombus in the distal SVC above the right atrium. Numerous paraspinal, mediastinal and left chest wall collaterals are again visualized. 3. Large bilateral pleural effusions, increased on the left compared to prior. Right pleural collection is loculated. Progressive consolidation within the right lung may reflect atelectasis or pneumonia. Multiple pulmonary nodules consistent with metastatic disease, some of which are now obscured by increased consolidation. 4. Left hilar adenopathy and right axillary adenopathy as before. Large infiltrative mediastinal mass/ adenopathy, grossly unchanged. Mild irregular narrowing of the right mainstem bronchus by mediastinal mass. 5. Stable sclerosis within the cervical and thoracic spine, some of the findings in the cervical spine and upper thoracic vertebra are felt related to prominent venous enhancement. Electronically Signed   By: Donavan Foil M.D.   On: 11/24/2016 03:12   Dg Chest Port 1 View  Result Date: 11/24/2016 CLINICAL DATA:  Worsening dyspnea tonight. EXAM: PORTABLE CHEST 1 VIEW COMPARISON:  09/05/2016, 11/06/2016 FINDINGS: Right pleural effusion is probably unchanged from the CT of 11/06/2016. Right hilar opacity corresponds to the infiltrative mass in the medial right lung on recent CT. There also is a small to moderate left pleural effusion which is also probably enlarged. Pulmonary vasculature is normal. There is narrowing of the tracheal air column at the upper mediastinum and thoracic inlet. This probably is due to nodal disease in the mediastinum. IMPRESSION: 1. Bilateral pleural effusions probably larger on the left compared to the recent prior studies. Known infiltrative mass in the medial right lung is probably unchanged. 2. Narrow transverse diameter of the tracheal air column, new from 09/05/2016 and possibly worsened from 11/06/2016. This probably is due to mediastinal nodal disease. Electronically Signed   By: Andreas Newport M.D.   On: 11/24/2016  00:21    Procedures Procedures (including critical care time)  Medications Ordered in ED Medications  iopamidol (ISOVUE-370) 76 % injection (not administered)  morphine CONCENTRATE 10 MG/0.5ML oral solution 5-10 mg (not administered)  prochlorperazine (COMPAZINE) tablet 10 mg (not administered)  sodium chloride flush (NS) 0.9 % injection 3 mL (not administered)  0.9 %  sodium chloride infusion (75 mLs Intravenous New Bag/Given 11/24/16 0521)  acetaminophen (TYLENOL) tablet 650 mg (not administered)    Or  acetaminophen (TYLENOL) suppository 650 mg (not administered)  albuterol (PROVENTIL) (2.5 MG/3ML) 0.083% nebulizer solution 2.5 mg (not administered)  oseltamivir (  TAMIFLU) capsule 75 mg (not administered)  enoxaparin (LOVENOX) injection 30 mg (not administered)  azithromycin (ZITHROMAX) 500 mg in dextrose 5 % 250 mL IVPB (not administered)  cefTRIAXone (ROCEPHIN) 1 g in dextrose 5 % 50 mL IVPB (not administered)  albuterol (PROVENTIL) (2.5 MG/3ML) 0.083% nebulizer solution 5 mg (5 mg Nebulization Given 11/24/16 0020)  ipratropium (ATROVENT) nebulizer solution 0.5 mg (0.5 mg Nebulization Given 11/24/16 0020)  sodium chloride 0.9 % bolus 500 mL (0 mLs Intravenous Stopped 11/24/16 0126)  iopamidol (ISOVUE-370) 76 % injection 80 mL (80 mLs Intravenous Contrast Given 11/24/16 0221)  sodium chloride 0.9 % bolus 500 mL (0 mLs Intravenous Stopped 11/24/16 0217)  piperacillin-tazobactam (ZOSYN) IVPB 3.375 g (0 g Intravenous Stopped 11/24/16 0250)  vancomycin (VANCOCIN) IVPB 1000 mg/200 mL premix (0 mg Intravenous Stopped 11/24/16 0401)  sodium chloride 0.9 % bolus 250 mL (0 mLs Intravenous Stopped 11/24/16 0251)  sodium chloride 0.9 % bolus 500 mL (500 mLs Intravenous Given 11/24/16 0520)     Initial Impression / Assessment and Plan / ED Course  I have reviewed the triage vital signs and the nursing notes.  Pertinent labs & imaging results that were available during my care of the patient were reviewed by  me and considered in my medical decision making (see chart for details).  ED ECG REPORT   Date: 11/24/2016  Rate: 139  Rhythm: sinus tachycardia  QRS Axis: normal  Intervals: normal  ST/T Wave abnormalities: nonspecific ST/T changes  Conduction Disutrbances:none  Narrative Interpretation:   Old EKG Reviewed: none available  EKG not available in epic for interpretation into muse  Clinical Course   CRITICAL CARE Performed by: Threasa Beards Total critical care time: 45 minutes Critical care time was exclusive of separately billable procedures and treating other patients. Critical care was necessary to treat or prevent imminent or life-threatening deterioration. Critical care was time spent personally by me on the following activities: development of treatment plan with patient and/or surrogate as well as nursing, discussions with consultants, evaluation of patient's response to treatment, examination of patient, obtaining history from patient or surrogate, ordering and performing treatments and interventions, ordering and review of laboratory studies, ordering and review of radiographic studies, pulse oximetry and re-evaluation of patient's condition.  3:46 AM d/w Dr. Loleta Books, [facility] for admission.  Pt to go stepdown bed, inpatient.   Pt seen upon arrival- pt with tachypnea, tachycardia, bilateral wheezing.  Pt has hx of metastatic esophageal cancer.  No fever.  Pt treated with albuterol/atrovent, CXR shows increasing pleural effusions.  Sepsis order set initiated due to leukocytosis in setting of tachypnea and O2 requirement- initially tachypnea and tachycardia possibly due to wheezing and lung mets- possible PE.  With infection more likely- sepsis order set begun- lactate elevated at 6- pt treated with broad spectrum abx, 30cc/kg NS bolus.  Vitals improving.  Pt admitted to stepdown bed.    Final Clinical Impressions(s) / ED Diagnoses   Final diagnoses:  Respiratory distress  HCAP  (healthcare-associated pneumonia)  Metastatic cancer (HCC)  Lactic acidosis  Tachycardia  Leukocytosis, unspecified type    New Prescriptions Current Discharge Medication List     I personally performed the services described in this documentation, which was scribed in my presence. The recorded information has been reviewed and is accurate.      Alfonzo Beers, MD 11/24/16 [phone]

## 2016-11-23 NOTE — ED Triage Notes (Signed)
Per EMS pt c/o SOB x1 day. Pt laid down and breathing difficulty increased. Pt hx of Lung cancer. Last cancer treatment yesterday. C/o generalized body aches. Denies fever or NVD.  Given in route:  '10mg'$  Albuterol .5 Atrovent,  125 Solumederol  HR 150 BP 140/94 Spo2 99% RA

## 2016-11-24 ENCOUNTER — Encounter (HOSPITAL_COMMUNITY): Payer: Self-pay | Admitting: Radiology

## 2016-11-24 ENCOUNTER — Emergency Department (HOSPITAL_COMMUNITY): Payer: BLUE CROSS/BLUE SHIELD

## 2016-11-24 DIAGNOSIS — C7801 Secondary malignant neoplasm of right lung: Secondary | ICD-10-CM | POA: Diagnosis present

## 2016-11-24 DIAGNOSIS — J189 Pneumonia, unspecified organism: Secondary | ICD-10-CM | POA: Diagnosis present

## 2016-11-24 DIAGNOSIS — F419 Anxiety disorder, unspecified: Secondary | ICD-10-CM | POA: Diagnosis present

## 2016-11-24 DIAGNOSIS — I959 Hypotension, unspecified: Secondary | ICD-10-CM | POA: Diagnosis not present

## 2016-11-24 DIAGNOSIS — Z66 Do not resuscitate: Secondary | ICD-10-CM | POA: Diagnosis present

## 2016-11-24 DIAGNOSIS — T17990A Other foreign object in respiratory tract, part unspecified in causing asphyxiation, initial encounter: Secondary | ICD-10-CM | POA: Diagnosis present

## 2016-11-24 DIAGNOSIS — D72829 Elevated white blood cell count, unspecified: Secondary | ICD-10-CM

## 2016-11-24 DIAGNOSIS — D649 Anemia, unspecified: Secondary | ICD-10-CM | POA: Diagnosis present

## 2016-11-24 DIAGNOSIS — C159 Malignant neoplasm of esophagus, unspecified: Secondary | ICD-10-CM | POA: Diagnosis not present

## 2016-11-24 DIAGNOSIS — R0600 Dyspnea, unspecified: Secondary | ICD-10-CM | POA: Diagnosis present

## 2016-11-24 DIAGNOSIS — C155 Malignant neoplasm of lower third of esophagus: Secondary | ICD-10-CM | POA: Diagnosis present

## 2016-11-24 DIAGNOSIS — J9809 Other diseases of bronchus, not elsewhere classified: Secondary | ICD-10-CM | POA: Diagnosis not present

## 2016-11-24 DIAGNOSIS — E871 Hypo-osmolality and hyponatremia: Secondary | ICD-10-CM | POA: Diagnosis present

## 2016-11-24 DIAGNOSIS — C7800 Secondary malignant neoplasm of unspecified lung: Secondary | ICD-10-CM | POA: Diagnosis not present

## 2016-11-24 DIAGNOSIS — D6481 Anemia due to antineoplastic chemotherapy: Secondary | ICD-10-CM | POA: Diagnosis present

## 2016-11-24 DIAGNOSIS — B9789 Other viral agents as the cause of diseases classified elsewhere: Secondary | ICD-10-CM | POA: Diagnosis present

## 2016-11-24 DIAGNOSIS — A419 Sepsis, unspecified organism: Secondary | ICD-10-CM | POA: Diagnosis present

## 2016-11-24 DIAGNOSIS — R000 Tachycardia, unspecified: Secondary | ICD-10-CM | POA: Diagnosis present

## 2016-11-24 DIAGNOSIS — J910 Malignant pleural effusion: Secondary | ICD-10-CM | POA: Diagnosis not present

## 2016-11-24 DIAGNOSIS — E872 Acidosis, unspecified: Secondary | ICD-10-CM

## 2016-11-24 DIAGNOSIS — J9811 Atelectasis: Secondary | ICD-10-CM | POA: Diagnosis present

## 2016-11-24 DIAGNOSIS — J9601 Acute respiratory failure with hypoxia: Secondary | ICD-10-CM | POA: Diagnosis present

## 2016-11-24 DIAGNOSIS — T451X5A Adverse effect of antineoplastic and immunosuppressive drugs, initial encounter: Secondary | ICD-10-CM | POA: Diagnosis present

## 2016-11-24 DIAGNOSIS — C799 Secondary malignant neoplasm of unspecified site: Secondary | ICD-10-CM | POA: Diagnosis not present

## 2016-11-24 DIAGNOSIS — B348 Other viral infections of unspecified site: Secondary | ICD-10-CM | POA: Diagnosis not present

## 2016-11-24 DIAGNOSIS — C7802 Secondary malignant neoplasm of left lung: Secondary | ICD-10-CM | POA: Diagnosis present

## 2016-11-24 DIAGNOSIS — D638 Anemia in other chronic diseases classified elsewhere: Secondary | ICD-10-CM | POA: Diagnosis not present

## 2016-11-24 DIAGNOSIS — D630 Anemia in neoplastic disease: Secondary | ICD-10-CM | POA: Diagnosis present

## 2016-11-24 DIAGNOSIS — Y95 Nosocomial condition: Secondary | ICD-10-CM | POA: Diagnosis present

## 2016-11-24 DIAGNOSIS — J690 Pneumonitis due to inhalation of food and vomit: Secondary | ICD-10-CM | POA: Diagnosis present

## 2016-11-24 DIAGNOSIS — C781 Secondary malignant neoplasm of mediastinum: Secondary | ICD-10-CM | POA: Diagnosis present

## 2016-11-24 DIAGNOSIS — T17590A Other foreign object in bronchus causing asphyxiation, initial encounter: Secondary | ICD-10-CM | POA: Diagnosis present

## 2016-11-24 DIAGNOSIS — R1313 Dysphagia, pharyngeal phase: Secondary | ICD-10-CM | POA: Diagnosis present

## 2016-11-24 DIAGNOSIS — Z87891 Personal history of nicotine dependence: Secondary | ICD-10-CM | POA: Diagnosis not present

## 2016-11-24 DIAGNOSIS — J398 Other specified diseases of upper respiratory tract: Secondary | ICD-10-CM | POA: Diagnosis present

## 2016-11-24 DIAGNOSIS — J90 Pleural effusion, not elsewhere classified: Secondary | ICD-10-CM | POA: Diagnosis present

## 2016-11-24 DIAGNOSIS — I871 Compression of vein: Secondary | ICD-10-CM | POA: Diagnosis present

## 2016-11-24 LAB — BASIC METABOLIC PANEL
ANION GAP: 12 (ref 5–15)
Anion gap: 9 (ref 5–15)
BUN: 20 mg/dL (ref 6–20)
BUN: 19 mg/dL (ref 6–20)
CALCIUM: 9.2 mg/dL (ref 8.9–10.3)
CO2: 22 mmol/L (ref 22–32)
CO2: 25 mmol/L (ref 22–32)
CREATININE: 0.6 mg/dL — AB (ref 0.61–1.24)
Calcium: 9 mg/dL (ref 8.9–10.3)
Chloride: 92 mmol/L — ABNORMAL LOW (ref 101–111)
Chloride: 93 mmol/L — ABNORMAL LOW (ref 101–111)
Creatinine, Ser: 0.64 mg/dL (ref 0.61–1.24)
GFR calc Af Amer: >60 mL/min (ref >60)
GFR calc Af Amer: >60 mL/min (ref >60)
GFR calc non Af Amer: >60 mL/min (ref >60)
GLUCOSE: 200 mg/dL — AB (ref 65–99)
Glucose, Bld: 156 mg/dL — ABNORMAL HIGH (ref 65–99)
POTASSIUM: 3.8 mmol/L (ref 3.5–5.1)
Potassium: 4.2 mmol/L (ref 3.5–5.1)
SODIUM: 127 mmol/L — AB (ref 135–145)
Sodium: 126 mmol/L — ABNORMAL LOW (ref 135–145)

## 2016-11-24 LAB — RESPIRATORY PANEL BY PCR
Adenovirus: NOT DETECTED
BORDETELLA PERTUSSIS-RVPCR: NOT DETECTED
CORONAVIRUS HKU1-RVPPCR: NOT DETECTED
CORONAVIRUS NL63-RVPPCR: NOT DETECTED
CORONAVIRUS OC43-RVPPCR: NOT DETECTED
Chlamydophila pneumoniae: NOT DETECTED
Coronavirus 229E: NOT DETECTED
Influenza A: NOT DETECTED
Influenza B: NOT DETECTED
METAPNEUMOVIRUS-RVPPCR: NOT DETECTED
Mycoplasma pneumoniae: NOT DETECTED
PARAINFLUENZA VIRUS 1-RVPPCR: NOT DETECTED
PARAINFLUENZA VIRUS 2-RVPPCR: NOT DETECTED
PARAINFLUENZA VIRUS 3-RVPPCR: NOT DETECTED
Parainfluenza Virus 4: NOT DETECTED
RHINOVIRUS / ENTEROVIRUS - RVPPCR: DETECTED — AB
Respiratory Syncytial Virus: NOT DETECTED

## 2016-11-24 LAB — CBC WITH DIFFERENTIAL/PLATELET
Basophils Absolute: 0 10*3/uL (ref 0.0–0.1)
Basophils Relative: 0 %
Eosinophils Absolute: 0 10*3/uL (ref 0.0–0.7)
Eosinophils Relative: 0 %
HEMATOCRIT: 31.3 % — AB (ref 39.0–52.0)
Hemoglobin: 10.6 g/dL — ABNORMAL LOW (ref 13.0–17.0)
Lymphocytes Relative: 1 %
Lymphs Abs: 0.2 10*3/uL — ABNORMAL LOW (ref 0.7–4.0)
MCH: 27.2 pg (ref 26.0–34.0)
MCHC: 33.9 g/dL (ref 30.0–36.0)
MCV: 80.3 fL (ref 78.0–100.0)
MONO ABS: 0.3 10*3/uL (ref 0.1–1.0)
MONOS PCT: 2 %
NEUTROS ABS: 13.9 10*3/uL — AB (ref 1.7–7.7)
Neutrophils Relative %: 97 %
Platelets: 262 10*3/uL (ref 150–400)
RBC: 3.9 MIL/uL — ABNORMAL LOW (ref 4.22–5.81)
RDW: 14 % (ref 11.5–15.5)
WBC: 14.3 10*3/uL — ABNORMAL HIGH (ref 4.0–10.5)

## 2016-11-24 LAB — MRSA PCR SCREENING: MRSA BY PCR: NEGATIVE

## 2016-11-24 LAB — URINALYSIS, ROUTINE W REFLEX MICROSCOPIC
BILIRUBIN URINE: NEGATIVE
Glucose, UA: 100 mg/dL — AB
Ketones, ur: NEGATIVE mg/dL
Leukocytes, UA: NEGATIVE
Nitrite: NEGATIVE
PH: 5 (ref 5.0–8.0)
Protein, ur: NEGATIVE mg/dL
SPECIFIC GRAVITY, URINE: 1.02 (ref 1.005–1.030)

## 2016-11-24 LAB — BLOOD GAS, ARTERIAL
Acid-base deficit: 1 mmol/L (ref 0.0–2.0)
Bicarbonate: 22.3 mmol/L (ref 20.0–28.0)
Drawn by: 422461
O2 Content: 3 L/min
O2 Saturation: 96.4 %
PATIENT TEMPERATURE: 98.6
PO2 ART: 90.7 mmHg (ref 83.0–108.0)
pCO2 arterial: 33.6 mmHg (ref 32.0–48.0)
pH, Arterial: 7.437 (ref 7.350–7.450)

## 2016-11-24 LAB — I-STAT CG4 LACTIC ACID, ED: Lactic Acid, Venous: 6.75 mmol/L (ref 0.5–1.9)

## 2016-11-24 LAB — STREP PNEUMONIAE URINARY ANTIGEN: STREP PNEUMO URINARY ANTIGEN: NEGATIVE

## 2016-11-24 LAB — CBC
HCT: 28 % — ABNORMAL LOW (ref 39.0–52.0)
Hemoglobin: 9.8 g/dL — ABNORMAL LOW (ref 13.0–17.0)
MCH: 27.8 pg (ref 26.0–34.0)
MCHC: 35 g/dL (ref 30.0–36.0)
MCV: 79.3 fL (ref 78.0–100.0)
PLATELETS: 240 10*3/uL (ref 150–400)
RBC: 3.53 MIL/uL — ABNORMAL LOW (ref 4.22–5.81)
RDW: 14 % (ref 11.5–15.5)
WBC: 10.1 10*3/uL (ref 4.0–10.5)

## 2016-11-24 LAB — URINALYSIS, MICROSCOPIC (REFLEX)
Bacteria, UA: NONE SEEN
SQUAMOUS EPITHELIAL / LPF: NONE SEEN

## 2016-11-24 LAB — OSMOLALITY: OSMOLALITY: 280 mosm/kg (ref 275–295)

## 2016-11-24 LAB — BRAIN NATRIURETIC PEPTIDE: B Natriuretic Peptide: 94.7 pg/mL (ref 0.0–100.0)

## 2016-11-24 LAB — INFLUENZA PANEL BY PCR (TYPE A & B)
INFLAPCR: NEGATIVE
Influenza B By PCR: NEGATIVE

## 2016-11-24 LAB — OSMOLALITY, URINE: Osmolality, Ur: 526 mOsm/kg (ref 300–900)

## 2016-11-24 LAB — PROCALCITONIN

## 2016-11-24 LAB — LACTIC ACID, PLASMA
LACTIC ACID, VENOUS: 1.9 mmol/L (ref 0.5–1.9)
Lactic Acid, Venous: 3.5 mmol/L (ref 0.5–1.9)

## 2016-11-24 MED ORDER — ACETAMINOPHEN 650 MG RE SUPP: 650.0000 mg | Freq: Four times a day (QID) | RECTAL | Status: DC | PRN

## 2016-11-24 MED ORDER — SODIUM CHLORIDE 0.9% FLUSH
3.0000 mL | Freq: Two times a day (BID) | INTRAVENOUS | Status: DC
  Administered 2016-11-24 – 2016-12-01 (×6): 3 mL via INTRAVENOUS

## 2016-11-24 MED ORDER — VANCOMYCIN HCL IN DEXTROSE 1-5 GM/200ML-% IV SOLN
1000.0000 mg | Freq: Once | INTRAVENOUS | Status: AC
  Administered 2016-11-24: 1000 mg via INTRAVENOUS
  Filled 2016-11-24: qty 200

## 2016-11-24 MED ORDER — ENOXAPARIN SODIUM 40 MG/0.4ML ~~LOC~~ SOLN: 40.0000 mg | SUBCUTANEOUS | Status: DC

## 2016-11-24 MED ORDER — LIP MEDEX EX OINT
TOPICAL_OINTMENT | CUTANEOUS | Status: DC | PRN
  Administered 2016-11-24: 15:00:00 via TOPICAL
  Filled 2016-11-24: qty 7

## 2016-11-24 MED ORDER — IOPAMIDOL (ISOVUE-370) INJECTION 76%
INTRAVENOUS | Status: AC
  Filled 2016-11-24: qty 100

## 2016-11-24 MED ORDER — PROCHLORPERAZINE MALEATE 10 MG PO TABS: 10.0000 mg | ORAL_TABLET | Freq: Four times a day (QID) | ORAL | Status: DC | PRN

## 2016-11-24 MED ORDER — SODIUM CHLORIDE 0.9 % IV BOLUS (SEPSIS)
500.0000 mL | Freq: Once | INTRAVENOUS | Status: AC
  Administered 2016-11-24: 500 mL via INTRAVENOUS

## 2016-11-24 MED ORDER — MORPHINE SULFATE (CONCENTRATE) 10 MG/0.5ML PO SOLN
5.0000 mg | ORAL | Status: DC | PRN
  Administered 2016-11-24 – 2016-11-27 (×5): 10 mg via SUBLINGUAL
  Administered 2016-11-28 (×3): 5 mg via SUBLINGUAL
  Administered 2016-11-28 – 2016-12-01 (×4): 10 mg via SUBLINGUAL
  Filled 2016-11-24 (×13): qty 0.5

## 2016-11-24 MED ORDER — ACETAMINOPHEN 325 MG PO TABS
650.0000 mg | ORAL_TABLET | Freq: Four times a day (QID) | ORAL | Status: DC | PRN
  Administered 2016-11-24: 650 mg via ORAL
  Filled 2016-11-24: qty 2

## 2016-11-24 MED ORDER — SODIUM CHLORIDE 0.9 % IV SOLN
INTRAVENOUS | Status: DC
  Administered 2016-11-24: 18:00:00 via INTRAVENOUS
  Administered 2016-11-24: 75 mL via INTRAVENOUS
  Administered 2016-11-25 – 2016-11-30 (×4): via INTRAVENOUS

## 2016-11-24 MED ORDER — DEXTROSE 5 % IV SOLN
1.0000 g | INTRAVENOUS | Status: DC
  Administered 2016-11-24 – 2016-11-27 (×4): 1 g via INTRAVENOUS
  Filled 2016-11-24 (×5): qty 10

## 2016-11-24 MED ORDER — ENOXAPARIN SODIUM 30 MG/0.3ML ~~LOC~~ SOLN
30.0000 mg | Freq: Every day | SUBCUTANEOUS | Status: DC
  Administered 2016-11-24 – 2016-12-01 (×8): 30 mg via SUBCUTANEOUS
  Filled 2016-11-24 (×9): qty 0.3

## 2016-11-24 MED ORDER — ORAL CARE MOUTH RINSE
15.0000 mL | Freq: Two times a day (BID) | OROMUCOSAL | Status: DC
  Administered 2016-11-24 – 2016-12-01 (×6): 15 mL via OROMUCOSAL

## 2016-11-24 MED ORDER — VANCOMYCIN HCL IN DEXTROSE 1-5 GM/200ML-% IV SOLN: 1000.0000 mg | INTRAVENOUS | Status: DC

## 2016-11-24 MED ORDER — DEXTROSE 5 % IV SOLN
500.0000 mg | INTRAVENOUS | Status: DC
  Administered 2016-11-24 – 2016-11-28 (×5): 500 mg via INTRAVENOUS
  Filled 2016-11-24 (×5): qty 500

## 2016-11-24 MED ORDER — ENSURE ENLIVE PO LIQD
237.0000 mL | Freq: Two times a day (BID) | ORAL | Status: DC
  Administered 2016-11-24 – 2016-11-28 (×5): 237 mL via ORAL

## 2016-11-24 MED ORDER — ALPRAZOLAM 0.5 MG PO TABS
0.5000 mg | ORAL_TABLET | Freq: Every evening | ORAL | Status: DC | PRN
  Administered 2016-11-24 – 2016-11-29 (×6): 0.5 mg via ORAL
  Filled 2016-11-24 (×6): qty 1

## 2016-11-24 MED ORDER — SODIUM CHLORIDE 0.9 % IV BOLUS (SEPSIS)
250.0000 mL | Freq: Once | INTRAVENOUS | Status: AC
  Administered 2016-11-24: 250 mL via INTRAVENOUS

## 2016-11-24 MED ORDER — PIPERACILLIN-TAZOBACTAM 3.375 G IVPB 30 MIN
3.3750 g | Freq: Once | INTRAVENOUS | Status: AC
  Administered 2016-11-24: 3.375 g via INTRAVENOUS
  Filled 2016-11-24: qty 50

## 2016-11-24 MED ORDER — ALBUTEROL SULFATE (2.5 MG/3ML) 0.083% IN NEBU
2.5000 mg | INHALATION_SOLUTION | RESPIRATORY_TRACT | Status: DC | PRN
  Administered 2016-11-25 – 2016-12-01 (×13): 2.5 mg via RESPIRATORY_TRACT
  Filled 2016-11-24 (×15): qty 3

## 2016-11-24 MED ORDER — LABETALOL HCL 5 MG/ML IV SOLN
5.0000 mg | INTRAVENOUS | Status: DC | PRN
  Administered 2016-11-24 – 2016-11-25 (×3): 5 mg via INTRAVENOUS
  Filled 2016-11-24 (×3): qty 4

## 2016-11-24 MED ORDER — CHLORHEXIDINE GLUCONATE 0.12 % MT SOLN
15.0000 mL | Freq: Two times a day (BID) | OROMUCOSAL | Status: DC
  Administered 2016-11-24 – 2016-12-01 (×10): 15 mL via OROMUCOSAL
  Filled 2016-11-24 (×12): qty 15

## 2016-11-24 MED ORDER — OSELTAMIVIR PHOSPHATE 75 MG PO CAPS
75.0000 mg | ORAL_CAPSULE | Freq: Two times a day (BID) | ORAL | Status: DC
  Administered 2016-11-24: 75 mg via ORAL
  Filled 2016-11-24: qty 1

## 2016-11-24 MED ORDER — PIPERACILLIN-TAZOBACTAM 3.375 G IVPB: 3.3750 g | Freq: Three times a day (TID) | INTRAVENOUS | Status: DC

## 2016-11-24 MED ORDER — IOPAMIDOL (ISOVUE-370) INJECTION 76%
80.0000 mL | Freq: Once | INTRAVENOUS | Status: AC | PRN
  Administered 2016-11-24: 80 mL via INTRAVENOUS

## 2016-11-24 NOTE — Progress Notes (Signed)
Pharmacy Antibiotic Note  Bruce [name] is a 56 y.o. male with hx of Lung Ca c/o SOB and generalized aches admitted on 12/13/2016 with CAP.  Pharmacy has been consulted for Zmax and Rocephin  Plan: Rocephin 1 Gm IV q24h Zmax 500 mg IV q24h    Temp (24hrs), Avg:97.6 F (36.4 C), Min:97.5 F (36.4 C), Max:97.7 F (36.5 C)   Recent Labs Lab 11/19/16 0908 11/19/16 0908 11/24/16 0110 11/24/16 0202  WBC 6.5  --  14.3*  --   CREATININE  --  0.8 0.64  --   LATICACIDVEN  --   --   --  6.75*    Estimated Creatinine Clearance: 60.5 mL/min (by C-G formula based on SCr of 0.64 mg/dL).    Allergies  Allergen Reactions  . Carboplatin Nausea Only and Other (See Comments)    Facial flushing; Headache---"family states he is not allergic to anything"    Antimicrobials this admission: 1/7 zosyn >>1/7  1/7 vancomycin >> 1/7 1/7 rocephin >> 1/7 zmax >> 1/7 tamiflu >>  Dose adjustments this admission:   Microbiology results:  BCx:   UCx:    Sputum:    MRSA PCR:   Thank you for allowing pharmacy to be a part of this patient's care. Rx will sign off as no further adjustments are necessary.   Dorrene German 11/24/2016 5:15 AM

## 2016-11-24 NOTE — Progress Notes (Signed)
Pharmacy Antibiotic Note  Bruce [name] is a 56 y.o. male with hx of Lung Ca c/o SOB and generalized aches admitted on 11/29/2016 with sepsis.  Pharmacy has been consulted for zosyn/vancomycin dosing.  Plan: Zosyn 3.375g IV q8h (4 hour infusion). Vancomycin 1 Gm IV q24h  VT=15-20 mg/L    Temp (24hrs), Avg:97.7 F (36.5 C), Min:97.7 F (36.5 C), Max:97.7 F (36.5 C)   Recent Labs Lab 11/19/16 0908 11/19/16 0908 11/24/16 0110 11/24/16 0202  WBC 6.5  --  14.3*  --   CREATININE  --  0.8 0.64  --   LATICACIDVEN  --   --   --  6.75*    Estimated Creatinine Clearance: 60.5 mL/min (by C-G formula based on SCr of 0.64 mg/dL).    Allergies  Allergen Reactions  . Carboplatin Nausea Only and Other (See Comments)    Facial flushing; Headache    Antimicrobials this admission: 1/7 zosyn >>  1/7 vancomycin >>   Dose adjustments this admission:   Microbiology results:  BCx:   UCx:    Sputum:    MRSA PCR:   Thank you for allowing pharmacy to be a part of this patient's care.  Dorrene German 11/24/2016 2:19 AM

## 2016-11-24 NOTE — Consult Note (Addendum)
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 He was started on palliative radiation to the chest lymphadenopathy and weakly salvage therapy withirinotecan and cisplatin. He has a history of esophagectomy in 2014 and is supposed to be on a dysphagia diet. History is obtained from his daughter -he had his chemotherapy on Friday and started suddenly coughing in the evening with an apparent choking episode and his eyes rolled back which made him call EMS In the ED was noted to be tachycardic and hypoxic requiring 5 L with mild leukocytosis, sodium of 126 and a lactate of 6.7. We are asked to comment  on the finding of loculated effusions noted on his CT scan  SIGNIFICANT EVENTS  1/5 chemotherapy  STUDIES:  CT angio 1/6 >> no PE but did show large loculated right effusion, moderate left effusion, known severe SVC and brachiocephalic narrowing, new severe pulmonary artery narrowing and progressive consolidation in right lung as well as irregular narrowing of right mainstem bronchus     PAST MEDICAL HISTORY :   has a past medical history of Cancer ([place]) (05/28/12); Dysphasia; Esophagitis; Lung cancer ([place]) (12/27/13); Mass of esophagus (05/28/2012); and Neuropathy ([place]).  has a past surgical history that includes Esophagogastroduodenoscopy (05/28/12); EUS (06/11/2012); Complete esophagectomy (08/26/2012); Pyloroplasty (08/26/2012); Video bronchoscopy (08/26/2012); Jejunostomy (08/26/2012); Video bronchoscopy (N/A, 01/31/2014); Video assisted thoracoscopy  (vats)/wedge resection (Right, 01/31/2014); and Lymph node dissection (Right, 01/31/2014). Prior to Admission medications   Medication Sig Start Date End Date Taking? Authorizing Provider  albuterol (PROVENTIL HFA;VENTOLIN HFA) 108 (90 Base) MCG/ACT inhaler Inhale 2 puffs into the lungs every 6 (six) hours as needed for wheezing or shortness of breath. 10/22/16  Yes Owens Shark, NP  emollient (BIAFINE) cream Apply topically as needed. 11/22/16  Yes [name] Pedro, PA-C  Morphine Sulfate (MORPHINE CONCENTRATE) 10 mg / 0.5 ml concentrated solution Place 0.25-0.5 mLs (5-10 mg total) under the tongue every 4 (four) hours as needed for severe pain or shortness of breath. 11/12/16  Yes Ladell Pier, MD  nystatin (MYCOSTATIN) 100000 UNIT/ML suspension Take 5 mLs (500,000 Units total) by mouth 4 (four) times daily. 11/22/16  Yes Kyung Rudd, MD  prochlorperazine (COMPAZINE) 10 MG tablet Take 1 tablet (10 mg total) by mouth every 6 (six) hours as needed for nausea or vomiting. 07/30/16  Yes Ladell Pier, MD  zolpidem (AMBIEN) 5 MG tablet Take 1 tablet (5 mg total) by mouth at bedtime as needed for sleep. 11/19/16  Yes Ladell Pier, MD  polyethylene glycol [facility]) packet Take 17 g by mouth daily. Patient not taking: Reported on 11/24/2016 05/07/16   Ladell Pier, MD   Allergies  Allergen Reactions  . Carboplatin Nausea Only and Other (See Comments)    Facial flushing; Headache---"family states he is not allergic to anything"    FAMILY HISTORY:  family history includes Breast cancer in his sister. SOCIAL HISTORY:  reports that he quit smoking about 5 years ago. His smoking use included Cigarettes. He has a 10.00 pack-year smoking history. He quit smokeless tobacco use about  9 years ago. He reports that he does not drink alcohol or use drugs.  REVIEW OF SYSTEMS:    Unable to obtain in detail due to language barrier Positives as noted in history  SUBJECTIVE:   VITAL SIGNS: Temp:  [96.5 F  (35.8 C)-97.7 F (36.5 C)] 97.2 F (36.2 C) (01/07 1100) Pulse Rate:  [124-154] 128 (01/07 1100) Resp:  [15-35] 32 (01/07 1100) BP: (97-166)/(68-135) 97/70 (01/07 1100) SpO2:  [96 %-100 %] 96 % (01/07 1100) Weight:  [95 lb 7.4 oz (43.3 kg)] 95 lb 7.4 oz (43.3 kg) (01/07 0500)  PHYSICAL EXAMINATION: Gen. Asian, Pleasant, well-nourished, in no distress, normal affect ENT -  no post nasal drip Neck: No JVD, no thyromegaly, no carotid bruits Lungs: no use of accessory muscles, no dullness to percussion, decreased bilateral without rales or rhonchi  Cardiovascular: Rhythm regular, heart sounds  normal, no murmurs, no peripheral edema Abdomen: soft and non-tender, no hepatosplenomegaly, BS normal. Musculoskeletal: No deformities, no cyanosis or clubbing Neuro:  alert, non focal Skin:  Warm, no lesions/ rash    Recent Labs Lab 11/19/16 0908 11/24/16 0110 11/24/16 0620  NA 131* 126* 127*  K 3.5 4.2 3.8  CL  --  92* 93*  CO2 '26 22 25  '$ BUN 9.'1 20 19  '$ CREATININE 0.8 0.64 0.60*  GLUCOSE 118 200* 156*    Recent Labs Lab 11/19/16 0908 11/24/16 0110 11/24/16 0620  HGB 11.3* 10.6* 9.8*  HCT 34.0* 31.3* 28.0*  WBC 6.5 14.3* 10.1  PLT 300 262 240   Ct Angio Chest Pe W Or Wo Contrast  Result Date: 11/24/2016 CLINICAL DATA:  Increased shortness of breath history of esophageal cancer with lung metastases EXAM: CT ANGIOGRAPHY CHEST WITH CONTRAST TECHNIQUE: Multidetector CT imaging of the chest was performed using the standard protocol during bolus administration of intravenous contrast. Multiplanar CT image reconstructions and MIPs were obtained to evaluate the vascular anatomy. CONTRAST:  80 mL Isovue 370 intravenous COMPARISON:  Chest x-ray 12/06/2016, CT chest 11/06/2016 FINDINGS: Cardiovascular: Non aneurysmal aorta. No dissection is seen. Numerous collateral vessels within the left chest wall and paraspinous area. Severe narrowing of the brachiocephalic vessels and SVC due to  mediastinal mass. Suspect thrombus within the distal SVC just above the right atrium. Severe diffuse narrowing/ encasement of the right pulmonary artery by mediastinal mass. Marked narrowing of right upper lobe pulmonary arteries with minimal distal enhancement noted. No gross embolus identified within the right lower lobe pulmonary arteries. Mild narrowing of the left pulmonary artery by a mediastinal mass. No gross central embolus identified on the left. Heart size nonenlarged. No gross effusion Mediastinum/Nodes: Ill-defined infiltrative soft tissue mass/adenopathy filling the mediastinum. The mass surrounds the great vessels of the aortic arch and the trachea with irregular narrowing of the right bronchi. Mass encases the central right greater than left pulmonary arteries. Left hilar adenopathy is again visualized. Patient is status post esophagectomy with gastric pull-through. Multiple enlarged right axillary nodes. Lungs/Pleura: Large bilateral pleural effusions, increased compared to prior. Mild rim enhancement around the right pleural collection. Emphasis mid disc disease is present. Multiple lung nodules again visualized consistent with metastatic disease. Increasing consolidation within the right lower lobe. No pneumothorax Upper Abdomen: Postsurgical changes of the stomach. Heterogenous enhancement of the liver. Spleen grossly unremarkable. Musculoskeletal: Stable skeletal structures with patchy areas of sclerosis within lower thoracic vertebra. Patchy sclerosis within upper thoracic and lower cervical vertebra part of which is related to vascular enhancement. Review of the MIP images confirms the  above findings. IMPRESSION: 1. No gross central embolus is visualized. There is severe narrowing of the right pulmonary artery by mediastinal mass with severe narrowing and attenuation of the right upper lobe pulmonary arterial branches. 2. Severe narrowing of the brachiocephalic vessels and SVC by mediastinal  tumor. Suspect that there is a small amount of thrombus in the distal SVC above the right atrium. Numerous paraspinal, mediastinal and left chest wall collaterals are again visualized. 3. Large bilateral pleural effusions, increased on the left compared to prior. Right pleural collection is loculated. Progressive consolidation within the right lung may reflect atelectasis or pneumonia. Multiple pulmonary nodules consistent with metastatic disease, some of which are now obscured by increased consolidation. 4. Left hilar adenopathy and right axillary adenopathy as before. Large infiltrative mediastinal mass/ adenopathy, grossly unchanged. Mild irregular narrowing of the right mainstem bronchus by mediastinal mass. 5. Stable sclerosis within the cervical and thoracic spine, some of the findings in the cervical spine and upper thoracic vertebra are felt related to prominent venous enhancement. Electronically Signed   By: Donavan Foil M.D.   On: 11/24/2016 03:12   Dg Chest Port 1 View  Result Date: 11/24/2016 CLINICAL DATA:  Worsening dyspnea tonight. EXAM: PORTABLE CHEST 1 VIEW COMPARISON:  09/05/2016, 11/06/2016 FINDINGS: Right pleural effusion is probably unchanged from the CT of 11/06/2016. Right hilar opacity corresponds to the infiltrative mass in the medial right lung on recent CT. There also is a small to moderate left pleural effusion which is also probably enlarged. Pulmonary vasculature is normal. There is narrowing of the tracheal air column at the upper mediastinum and thoracic inlet. This probably is due to nodal disease in the mediastinum. IMPRESSION: 1. Bilateral pleural effusions probably larger on the left compared to the recent prior studies. Known infiltrative mass in the medial right lung is probably unchanged. 2. Narrow transverse diameter of the tracheal air column, new from 09/05/2016 and possibly worsened from 11/06/2016. This probably is due to mediastinal nodal disease. Electronically  Signed   By: Andreas Newport M.D.   On: 11/24/2016 00:21    ASSESSMENT / PLAN:  Acute respiratory failure-which is now improved, he is on 2 L nasal cannula Episode of causing admission seems to be related to aspiration Lactic acidosis is resolved-I would doubt sepsis syndrome given Low procalcitonin and resolution of leukocytosis  Bilateral effusions -right was tapped in 04/2016, only 25 mL removed for diagnostic purposes and cytology was negative. Effusions are very likely malignant, we'll plan for repeat thoracenteses on the right by ultrasound guidance and recent cytology. I doubt that he is a great candidate for Pleurx catheter but will discuss with Dr. Malachy Mood on Monday. Unfortunately his cancer seems to be progressing with evidence of SVC obstruction on his CAT scan although this is not clinically apparent  Would recommend swallow evaluation given likely aspiration and history of esophagectomy  Kara Mead MD. FCCP. [facility] Pager [phone] If no response call [phone]    11/24/2016, 12:36 PM

## 2016-11-24 NOTE — ED Notes (Signed)
Called to room by family states IV in left [facility] hurting good blood return noted IV site free of redness or swelling noted, no reaction to medication noted.

## 2016-11-24 NOTE — H&P (Signed)
History and Physical  Patient Name: Bruce [name]     WUJ:[id number]    DOB: [birth year]    DOA: 12/18/2016 PCP: No PCP Per Patient  Oncology: Dr. Clelia Schaumann Onc: Dr. Lisbeth Renshaw   Patient coming from: Home  Chief Complaint: Choking, coughing, difficulty breathing  HPI: Bruce [name] is a 56 y.o. male with a past medical history significant for metastatic esophageal cancer c/b loculated R pleural effusion, SVC syndrome and tracheal compression on palliative chemorad who presents with sudden coughing.  Caveat that all history is collected through daughter interpreting, as patient does not speak [place] and declines telephonic interpreter to me tonight.  The patient had his routine irinotecan/cisplatin infusion Friday, and was recuperating today as usual the day after, when tonight he was at home with his wife and daughter when he started coughing, tried to cough up some phlegm but it seemed to choke him, and he had "a stroke" where his eyes rolled back and he seemed to be unable to breathe, so his wife called 9-1-1.  EMS found him SpO2 99% on room air, struggling to breathe, HR 150 and BP 140/94 and brought him in, gave Solu-medrol, Duo-neb en route.  Patient endorses wheezing, also recurrent chills this week, which he and family attribute to typical after chemo.  ED course: -Afebrile, heart rate 154 and sinus, respirations 30s, BP 118/84, pulse ox 100% on 5L [place] -Na 126, K 4.2, Cr 0.64, WBC 14.3K, Hgb 10.6 -BNP normal -Lactic acid 6.75 -pH 7.43, pCO2 33, pO2 90 on 5L, and HCO3 22 -CXR showed large right effusion, and left effusion, tracheal narrowing -CT PE protocol showed no PE but did show large loculated right effusion, moderate left effusion, known severe SVC and brachiocephalic narrowing, new severe pulmonary artery narrowing and progressive consolidation in right lung as well as irregular narrowing of right mainstem bronchus -He was given 30 cc/kg bolus, cultures were obtained, and  vanc and Zosyn were administered and TRH were asked to evaluate             ROS: Review of Systems  Constitutional: Positive for chills, malaise/fatigue and weight loss. Negative for fever.  Respiratory: Positive for cough, sputum production, shortness of breath and wheezing.   Cardiovascular: Negative for chest pain, orthopnea and leg swelling.  Musculoskeletal: Positive for myalgias.  Neurological: Positive for weakness.  All other systems reviewed and are negative.         Past Medical History:  Diagnosis Date  . 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 Dysphasia    solid and liquid  . Esophagitis   . Lung cancer ([place]) 12/27/13   right upper lung  . Mass of esophagus 05/28/2012   BX'D DISTAL ESOPHAGUS,PENDING  . Neuropathy ([place])    right hand numbness 1 year 2012    Past Surgical History:  Procedure Laterality Date  . COMPLETE ESOPHAGECTOMY  08/26/2012   Procedure: ESOPHAGECTOMY COMPLETE;  Surgeon: [name], MD;  Location: [facility] OR;  Service: Thoracic;  Laterality: N/A;  transhiatal   . 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 EUS  06/11/2012   Procedure: UPPER ENDOSCOPIC ULTRASOUND (EUS) RADIAL;  Surgeon: Milus Banister, MD;  Location: [facility];  Service: Endoscopy;  Laterality: N/A;  . JEJUNOSTOMY  08/26/2012   Procedure: JEJUNOSTOMY;  Surgeon: [name], MD;  Location: [place];  Service: Thoracic;  Laterality: N/A;  placement of  feeding jujunostomy tube  . LYMPH NODE DISSECTION Right 01/31/2014   Procedure: LYMPH NODE DISSECTION;  Surgeon: [name], MD;  Location: [facility];  Service: Thoracic;  Laterality: Right;  . PYLOROPLASTY  08/26/2012   Procedure: PYLOROPLASTY;  Surgeon: [name], MD;  Location: [place];  Service: Thoracic;  Laterality: N/A;  . VIDEO ASSISTED THORACOSCOPY (VATS)/WEDGE RESECTION Right 01/31/2014   Procedure:  VIDEO ASSISTED THORACOSCOPY (VATS)/WEDGE RESECTION; with insertion of On Q pain pump;  Surgeon: [name], MD;  Location: [place];  Service: Thoracic;  Laterality: Right;  with insertion of On Q pain pump  . VIDEO BRONCHOSCOPY  08/26/2012   Procedure: VIDEO BRONCHOSCOPY;  Surgeon: [name], MD;  Location: [place];  Service: Thoracic;  Laterality: N/A;  . VIDEO BRONCHOSCOPY N/A 01/31/2014   Procedure: VIDEO BRONCHOSCOPY;  Surgeon: [name], MD;  Location: [facility] OR;  Service: Thoracic;  Laterality: N/A;    Social History: Patient lives with wife and daughter.  The patient walks unassisted.  Lately only can walk a few feet.  Former smoker.  From [place].  Worked for [employer], making signs.    Allergies  Allergen Reactions  . Carboplatin Nausea Only and Other (See Comments)    Facial flushing; Headache---"family states he is not allergic to anything"    Family history: family history includes Breast cancer in his sister.  Prior to Admission medications   Medication Sig Start Date End Date Taking? Authorizing Provider  albuterol (PROVENTIL HFA;VENTOLIN HFA) 108 (90 Base) MCG/ACT inhaler Inhale 2 puffs into the lungs every 6 (six) hours as needed for wheezing or shortness of breath. 10/22/16  Yes Owens Shark, NP  emollient (BIAFINE) cream Apply topically as needed. 11/22/16  Yes [name] Pedro, PA-C  Morphine Sulfate (MORPHINE CONCENTRATE) 10 mg / 0.5 ml concentrated solution Place 0.25-0.5 mLs (5-10 mg total) under the tongue every 4 (four) hours as needed for severe pain or shortness of breath. 11/12/16  Yes Ladell Pier, MD  nystatin (MYCOSTATIN) 100000 UNIT/ML suspension Take 5 mLs (500,000 Units total) by mouth 4 (four) times daily. 11/22/16  Yes Kyung Rudd, MD  prochlorperazine (COMPAZINE) 10 MG tablet Take 1 tablet (10 mg total) by mouth every 6 (six) hours as needed for nausea or vomiting. 07/30/16  Yes Ladell Pier, MD  zolpidem (AMBIEN) 5 MG tablet Take 1  tablet (5 mg total) by mouth at bedtime as needed for sleep. 11/19/16  Yes Ladell Pier, MD  polyethylene glycol [facility]) packet Take 17 g by mouth daily. Patient not taking: Reported on 11/24/2016 05/07/16   Ladell Pier, MD       Physical Exam: BP 118/80   Pulse (!) 132   Temp 97.5 F (36.4 C) (Oral)   Resp 23   SpO2 100%  General appearance: Cachectic adult male, alert and in tired appearing, breathing fast.   Eyes: Anicteric, conjunctiva pink, lids and lashes normal. PERRL.    ENT: No nasal deformity, discharge, epistaxis.  Hearing normal. OP moist without lesions.   Neck: No neck masses.  Trachea midline.  No thyromegaly/tenderness. Skin: Warm and dry.  No jaundice.  No suspicious rashes or lesions. Cardiac: Tachycardic, regular, nl S1-S2, no murmurs appreciated.  Capillary refill is brisk.  Numerous upper veins prominent (forehead, chest).  No LE edema.  Radial pulses 2+ and symmetric. Respiratory: Tachypneic, belly breathing.  Speaks in ful sentences.  Diminshed more on right.  Coarse rales on left. Wheezes bilaterally.  Abdomen: Abdomen cachectic.  No TTP. No ascites, distension.   MSK: No deformities or effusions.  No cyanosis or clubbing.  Diffuse loss of muscle mass. Neuro: Cranial nerves grossly intact. Speech is fluent.  Muscle strength globally weak but symmetric.    Psych: Sensorium intact and responding to questions, attention normal.  Behavior appropriate.  Affect blunted, listless.  Judgment and insight appear normal.     Labs on Admission:  I have personally reviewed following labs and imaging studies: CBC:  Recent Labs Lab 11/19/16 0908 11/24/16 0110  WBC 6.5 14.3*  NEUTROABS 5.2 13.9*  HGB 11.3* 10.6*  HCT 34.0* 31.3*  MCV 83.6 80.3  PLT 300 767   Basic Metabolic Panel:  Recent Labs Lab 11/19/16 0908 11/24/16 0110  NA 131* 126*  K 3.5 4.2  CL  --  92*  CO2 26 22  GLUCOSE 118 200*  BUN 9.1 20  CREATININE 0.8 0.64  CALCIUM 10.4 9.2  MG  2.2  --    GFR: Estimated Creatinine Clearance: 60.5 mL/min (by C-G formula based on SCr of 0.64 mg/dL).  Liver Function Tests:  Recent Labs Lab 11/19/16 0908  AST 27  ALT 26  ALKPHOS 149  BILITOT 0.50  PROT 8.5*  ALBUMIN 2.9*   No results for input(s): LIPASE, AMYLASE in the last 168 hours. No results for input(s): AMMONIA in the last 168 hours. Coagulation Profile: No results for input(s): INR, PROTIME in the last 168 hours. Cardiac Enzymes: No results for input(s): CKTOTAL, CKMB, CKMBINDEX, TROPONINI in the last 168 hours. BNP (last 3 results) No results for input(s): PROBNP in the last 8760 hours. HbA1C: No results for input(s): HGBA1C in the last 72 hours. CBG: No results for input(s): GLUCAP in the last 168 hours. Lipid Profile: No results for input(s): CHOL, HDL, LDLCALC, TRIG, CHOLHDL, LDLDIRECT in the last 72 hours. Thyroid Function Tests: No results for input(s): TSH, T4TOTAL, FREET4, T3FREE, THYROIDAB in the last 72 hours. Anemia Panel: No results for input(s): VITAMINB12, FOLATE, FERRITIN, TIBC, IRON, RETICCTPCT in the last 72 hours. Sepsis Labs: Lactic acid 6.75 Invalid input(s): PROCALCITONIN, LACTICIDVEN No results found for this or any previous visit (from the past 240 hour(s)).       Radiological Exams on Admission: Personally reviewed CT and reviewed report: Ct Angio Chest Pe W Or Wo Contrast  Result Date: 11/24/2016 CLINICAL DATA:  Increased shortness of breath history of esophageal cancer with lung metastases EXAM: CT ANGIOGRAPHY CHEST WITH CONTRAST TECHNIQUE: Multidetector CT imaging of the chest was performed using the standard protocol during bolus administration of intravenous contrast. Multiplanar CT image reconstructions and MIPs were obtained to evaluate the vascular anatomy. CONTRAST:  80 mL Isovue 370 intravenous COMPARISON:  Chest x-ray 12/17/2016, CT chest 11/06/2016 FINDINGS: Cardiovascular: Non aneurysmal aorta. No dissection is seen.  Numerous collateral vessels within the left chest wall and paraspinous area. Severe narrowing of the brachiocephalic vessels and SVC due to mediastinal mass. Suspect thrombus within the distal SVC just above the right atrium. Severe diffuse narrowing/ encasement of the right pulmonary artery by mediastinal mass. Marked narrowing of right upper lobe pulmonary arteries with minimal distal enhancement noted. No gross embolus identified within the right lower lobe pulmonary arteries. Mild narrowing of the left pulmonary artery by a mediastinal mass. No gross central embolus identified on the left. Heart size nonenlarged. No gross effusion Mediastinum/Nodes: Ill-defined infiltrative soft tissue mass/adenopathy filling the mediastinum. The mass surrounds the great vessels of the aortic arch and the trachea with  irregular narrowing of the right bronchi. Mass encases the central right greater than left pulmonary arteries. Left hilar adenopathy is again visualized. Patient is status post esophagectomy with gastric pull-through. Multiple enlarged right axillary nodes. Lungs/Pleura: Large bilateral pleural effusions, increased compared to prior. Mild rim enhancement around the right pleural collection. Emphasis mid disc disease is present. Multiple lung nodules again visualized consistent with metastatic disease. Increasing consolidation within the right lower lobe. No pneumothorax Upper Abdomen: Postsurgical changes of the stomach. Heterogenous enhancement of the liver. Spleen grossly unremarkable. Musculoskeletal: Stable skeletal structures with patchy areas of sclerosis within lower thoracic vertebra. Patchy sclerosis within upper thoracic and lower cervical vertebra part of which is related to vascular enhancement. Review of the MIP images confirms the above findings. IMPRESSION: 1. No gross central embolus is visualized. There is severe narrowing of the right pulmonary artery by mediastinal mass with severe narrowing and  attenuation of the right upper lobe pulmonary arterial branches. 2. Severe narrowing of the brachiocephalic vessels and SVC by mediastinal tumor. Suspect that there is a small amount of thrombus in the distal SVC above the right atrium. Numerous paraspinal, mediastinal and left chest wall collaterals are again visualized. 3. Large bilateral pleural effusions, increased on the left compared to prior. Right pleural collection is loculated. Progressive consolidation within the right lung may reflect atelectasis or pneumonia. Multiple pulmonary nodules consistent with metastatic disease, some of which are now obscured by increased consolidation. 4. Left hilar adenopathy and right axillary adenopathy as before. Large infiltrative mediastinal mass/ adenopathy, grossly unchanged. Mild irregular narrowing of the right mainstem bronchus by mediastinal mass. 5. Stable sclerosis within the cervical and thoracic spine, some of the findings in the cervical spine and upper thoracic vertebra are felt related to prominent venous enhancement. Electronically Signed   By: Donavan Foil M.D.   On: 11/24/2016 03:12   Dg Chest Port 1 View  Result Date: 11/24/2016 CLINICAL DATA:  Worsening dyspnea tonight. EXAM: PORTABLE CHEST 1 VIEW COMPARISON:  09/05/2016, 11/06/2016 FINDINGS: Right pleural effusion is probably unchanged from the CT of 11/06/2016. Right hilar opacity corresponds to the infiltrative mass in the medial right lung on recent CT. There also is a small to moderate left pleural effusion which is also probably enlarged. Pulmonary vasculature is normal. There is narrowing of the tracheal air column at the upper mediastinum and thoracic inlet. This probably is due to nodal disease in the mediastinum. IMPRESSION: 1. Bilateral pleural effusions probably larger on the left compared to the recent prior studies. Known infiltrative mass in the medial right lung is probably unchanged. 2. Narrow transverse diameter of the tracheal air  column, new from 09/05/2016 and possibly worsened from 11/06/2016. This probably is due to mediastinal nodal disease. Electronically Signed   By: Andreas Newport M.D.   On: 11/24/2016 00:21    EKG: Independently reviewed. Rate 139, QTc 376, sinus.    Assessment/Plan  1. Sepsis, suspect from pneumonia:  Suspected source pneumonia versus flu. Empyema within differential.  Progressive compression of pulmonary artery may also be contributing to dyspnea.  Patient meets criteria given tachycardia, tachypnea, leukocytosis, and evidence of organ dysfunction.  Lactate >6 mmol/L and repeat ordered within 6 hours.  Antibiotics delivered in the ED.    -Sepsis bundle utilized:  -Blood and urine cultures drawn  -30 ml/kg bolus given in ED, will repeat lactic acid  -Start targeted antibiotics with ceftriaxone and azithromycin and Tamiflu, based on suspected source of infection    -Follow-up flu swab  -  Follow urine pneumonia antigens  -Repeat renal function and complete blood count in AM  -Code SEPSIS called to E-link  -If dyspnea persists will consider diagnostic and therapeutic thoracentesis    2. Hyponatremia:  Worsening, but baseline ~130. -Fluid resuscitation and trend BMP -Check urine sodium, Osms  3. Metastatic esophageal cancer:  -Continue morphine for pain, dyspnea -Inbasket msg sent to primary Oncologist -Patient will be due for radiation treatment on Monday if stable -CT suggests possible new SVC clot, will defer anticoagulation until after goals of care discussion with Oncology  4. Normocytic anemia:  From chemo.  Stable.     DVT prophylaxis: Lovenox  Code Status: FULL  Family Communication: Daughter at bedside; CODE STATUS confirmed Disposition Plan: Anticipate IV antibiotics and fluids, follow flu swab, monitor respiratory status in stepdown.   Consults called: None Admission status: INPATIENT, stepdown        Medical decision making: Patient seen at 4:23 AM  on 11/24/2016.  The patient was discussed with Dr. Canary Brim.  What exists of the patient's chart was reviewed in depth and summarized above.  Clinical condition: stable.        Edwin Dada [facility] Pager [phone]      At the time of admission, it appears that the appropriate admission status for this patient is INPATIENT. This is judged to be reasonable and necessary in order to provide the required intensity of service to ensure the patient's safety given the presenting symptoms, physical exam findings, and initial radiographic and laboratory data in the context of their chronic comorbidities.  Together, these circumstances are felt to place him at high risk for further clinical deterioration threatening life, limb, or organ.   Patient requires inpatient status due to high intensity of service, high risk for further deterioration and high frequency of surveillance required because of this acute illness that poses a threat to life, limb or bodily function.   Factors supporting this level of care include: Presentation with tachycpnea >30 times per minute with leukocytosis and lactic acid >6 mmol/L.   Hyponatremia Metastatic esophageal cancer complicated by enlarging pleural effusions and SVC syndrome and new pulmonary artery narrowing  I certify that at the point of admission it is my clinical judgment that the patient will require inpatient hospital care spanning beyond 2 midnights from the point of admission and that early discharge would result in unnecessary risk of decompensation and readmission or threat to life, limb or bodily function.

## 2016-11-24 NOTE — Progress Notes (Signed)
PROGRESS NOTE    [place]  QIH:[id number] DOB: Mar 23, 1961 DOA: 11/20/2016 PCP: No PCP Per Patient    Brief Narrative:  56 y.o. male with a past medical history significant for metastatic esophageal cancer c/b loculated R pleural effusion, SVC syndrome and tracheal compression on palliative chemorad who presents with sudden coughing.  Caveat that all history is collected through daughter interpreting, as patient does not speak [place] and declines telephonic interpreter to me tonight.  The patient had his routine irinotecan/cisplatin infusion Friday, and was recuperating today as usual the day after, when tonight he was at home with his wife and daughter when he started coughing, tried to cough up some phlegm but it seemed to choke him, and he had "a stroke" where his eyes rolled back and he seemed to be unable to breathe, so his wife called 9-1-1.  EMS found him SpO2 99% on room air, struggling to breathe, HR 150 and BP 140/94 and brought him in, gave Solu-medrol, Duo-neb en route.  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 Sepsis, suspect from pneumonia:  -Suspected source pneumonia. -Patient meets criteria given tachycardia, tachypnea, leukocytosis, and evidence of organ dysfunction -flu negative. Will d/c Tamiflu -Patient is noted to be rhinovirus positive -Patient is continued on azithro and rocephin -Lactic acid has normalized -Leukocytosis normalized -Recheck CBC in AM  2. Hyponatremia:  -Worsening, baseline Na of  ~130. -Sodium stable this AM at 127 -repeat bmet in AM  3. Metastatic esophageal cancer:  -Continue morphine for pain,  dyspnea -In basket msg sent to primary Oncologist -Patient originally planned for radiation treatment on Monday if stable -CT suggests possible new SVC clot, will defer anticoagulation until after goals of care discussion with Oncology   4. Normocytic anemia:  -Suspect from chronic disease/cancer -Currently stable  5. Bilateral Large Pleural Effusion -Patient had failed attempt at thoracentesis in 6/17 -Most likely malignant effusion -Consulted Pulmonary with recommendations for US guided thoracentesis  DVT prophylaxis: Lovenox Code Status: Full Family Communication: Pt in room, family at bedside Disposition Plan: Uncertain at this time  Consultants:     Procedures:     Antimicrobials: Anti-infectives    Start     Dose/Rate Route Frequency Ordered Stop   11/25/16 0600  vancomycin (VANCOCIN) IVPB 1000 mg/200 mL premix  Status:  Discontinued     1,000 mg 200 mL/hr over 60 Minutes Intravenous Every 24 hours 11/24/16 0300 11/24/16 0521   11/24/16 1000  piperacillin-tazobactam (ZOSYN) IVPB 3.375 g  Status:  Discontinued     3.375 g 12.5 mL/hr over 240 Minutes Intravenous Every 8 hours 11/24/16 0225 11/24/16 0521   11/24/16 1000  oseltamivir (TAMIFLU) capsule 75 mg     75 mg Oral 2 times daily 11/24/16 0449 11/29/16 0959   11/24/16 0800  cefTRIAXone (ROCEPHIN) 1 g in dextrose 5 % 50 mL IVPB     1 g 100 mL/hr over 30 Minutes Intravenous Every 24 hours 11/24/16 0514     11/24/16 0600  azithromycin (ZITHROMAX) 500 mg in dextrose 5 % 250 mL IVPB     500 mg 250 mL/hr over 60 Minutes Intravenous Every 24 hours 11/24/16 0513  11/24/16 0200  piperacillin-tazobactam (ZOSYN) IVPB 3.375 g     3.375 g 100 mL/hr over 30 Minutes Intravenous  Once 11/24/16 0148 11/24/16 0250   11/24/16 0200  vancomycin (VANCOCIN) IVPB 1000 mg/200 mL premix     1,000 mg 200 mL/hr over 60 Minutes Intravenous  Once 11/24/16 0148 11/24/16 0401       Subjective: Complains of  sob  Objective: Vitals:   11/24/16 0500 11/24/16 0600 11/24/16 0700 11/24/16 0738  BP:  119/74 112/73   Pulse: (!) 135 (!) 126 (!) 125   Resp: (!) '26 18 15   '$ Temp: (!) 96.6 F (35.9 C)   (!) 96.5 F (35.8 C)  TempSrc: Axillary   Axillary  SpO2: 99% 99% 97%   Weight: 43.3 kg (95 lb 7.4 oz)     Height: '5\' 3"'$  (1.6 m)       Intake/Output Summary (Last 24 hours) at 11/24/16 0834 Last data filed at 11/24/16 0700  Gross per 24 hour  Intake             1225 ml  Output              500 ml  Net              725 ml   Filed Weights   11/24/16 0500  Weight: 43.3 kg (95 lb 7.4 oz)    Examination:  General exam: Appears calm and comfortable  Respiratory system: decreased BS throughout, increased resp effort Cardiovascular system: S1 & S2 heard, RRR Gastrointestinal system: Abdomen is nondistended, soft and nontender. No organomegaly or masses felt. Normal bowel sounds heard. Central nervous system: Alert and oriented. No focal neurological deficits. Extremities: Symmetric 5 x 5 power. Skin: No rashes, lesions Psychiatry: Judgement and insight appear normal. Mood & affect appropriate.   Data Reviewed: I have personally reviewed following labs and imaging studies  CBC:  Recent Labs Lab 11/19/16 0908 11/24/16 0110 11/24/16 0620  WBC 6.5 14.3* 10.1  NEUTROABS 5.2 13.9*  --   HGB 11.3* 10.6* 9.8*  HCT 34.0* 31.3* 28.0*  MCV 83.6 80.3 79.3  PLT 300 262 782   Basic Metabolic Panel:  Recent Labs Lab 11/19/16 0908 11/24/16 0110 11/24/16 0620  NA 131* 126* 127*  K 3.5 4.2 3.8  CL  --  92* 93*  CO2 '26 22 25  '$ GLUCOSE 118 200* 156*  BUN 9.'1 20 19  '$ CREATININE 0.8 0.64 0.60*  CALCIUM 10.4 9.2 9.0  MG 2.2  --   --    GFR: Estimated Creatinine Clearance: 64.6 mL/min (by C-G formula based on SCr of 0.6 mg/dL (L)). Liver Function Tests:  Recent Labs Lab 11/19/16 0908  AST 27  ALT 26  ALKPHOS 149  BILITOT 0.50  PROT 8.5*  ALBUMIN 2.9*   No results for input(s):  LIPASE, AMYLASE in the last 168 hours. No results for input(s): AMMONIA in the last 168 hours. Coagulation Profile: No results for input(s): INR, PROTIME in the last 168 hours. Cardiac Enzymes: No results for input(s): CKTOTAL, CKMB, CKMBINDEX, TROPONINI in the last 168 hours. BNP (last 3 results) No results for input(s): PROBNP in the last 8760 hours. HbA1C: No results for input(s): HGBA1C in the last 72 hours. CBG: No results for input(s): GLUCAP in the last 168 hours. Lipid Profile: No results for input(s): CHOL, HDL, LDLCALC, TRIG, CHOLHDL, LDLDIRECT in the last 72 hours. Thyroid Function Tests: No results for input(s): TSH, T4TOTAL, FREET4, T3FREE, THYROIDAB in the last 72 hours.  Anemia Panel: No results for input(s): VITAMINB12, FOLATE, FERRITIN, TIBC, IRON, RETICCTPCT in the last 72 hours. Sepsis Labs:  Recent Labs Lab 11/24/16 0202 11/24/16 0620  PROCALCITON  --  <0.10  LATICACIDVEN 6.75* 3.5*    Recent Results (from the past 240 hour(s))  MRSA PCR Screening     Status: None   Collection Time: 11/24/16  5:41 AM  Result Value Ref Range Status   MRSA by PCR NEGATIVE NEGATIVE Final    Comment:        The GeneXpert MRSA Assay (FDA approved for NASAL specimens only), is one component of a comprehensive MRSA colonization surveillance program. It is not intended to diagnose MRSA infection nor to guide or monitor treatment for MRSA infections.      Radiology Studies: Ct Angio Chest Pe W Or Wo Contrast  Result Date: 11/24/2016 CLINICAL DATA:  Increased shortness of breath history of esophageal cancer with lung metastases EXAM: CT ANGIOGRAPHY CHEST WITH CONTRAST TECHNIQUE: Multidetector CT imaging of the chest was performed using the standard protocol during bolus administration of intravenous contrast. Multiplanar CT image reconstructions and MIPs were obtained to evaluate the vascular anatomy. CONTRAST:  80 mL Isovue 370 intravenous COMPARISON:  Chest x-ray 12/09/2016,  CT chest 11/06/2016 FINDINGS: Cardiovascular: Non aneurysmal aorta. No dissection is seen. Numerous collateral vessels within the left chest wall and paraspinous area. Severe narrowing of the brachiocephalic vessels and SVC due to mediastinal mass. Suspect thrombus within the distal SVC just above the right atrium. Severe diffuse narrowing/ encasement of the right pulmonary artery by mediastinal mass. Marked narrowing of right upper lobe pulmonary arteries with minimal distal enhancement noted. No gross embolus identified within the right lower lobe pulmonary arteries. Mild narrowing of the left pulmonary artery by a mediastinal mass. No gross central embolus identified on the left. Heart size nonenlarged. No gross effusion Mediastinum/Nodes: Ill-defined infiltrative soft tissue mass/adenopathy filling the mediastinum. The mass surrounds the great vessels of the aortic arch and the trachea with irregular narrowing of the right bronchi. Mass encases the central right greater than left pulmonary arteries. Left hilar adenopathy is again visualized. Patient is status post esophagectomy with gastric pull-through. Multiple enlarged right axillary nodes. Lungs/Pleura: Large bilateral pleural effusions, increased compared to prior. Mild rim enhancement around the right pleural collection. Emphasis mid disc disease is present. Multiple lung nodules again visualized consistent with metastatic disease. Increasing consolidation within the right lower lobe. No pneumothorax Upper Abdomen: Postsurgical changes of the stomach. Heterogenous enhancement of the liver. Spleen grossly unremarkable. Musculoskeletal: Stable skeletal structures with patchy areas of sclerosis within lower thoracic vertebra. Patchy sclerosis within upper thoracic and lower cervical vertebra part of which is related to vascular enhancement. Review of the MIP images confirms the above findings. IMPRESSION: 1. No gross central embolus is visualized. There is  severe narrowing of the right pulmonary artery by mediastinal mass with severe narrowing and attenuation of the right upper lobe pulmonary arterial branches. 2. Severe narrowing of the brachiocephalic vessels and SVC by mediastinal tumor. Suspect that there is a small amount of thrombus in the distal SVC above the right atrium. Numerous paraspinal, mediastinal and left chest wall collaterals are again visualized. 3. Large bilateral pleural effusions, increased on the left compared to prior. Right pleural collection is loculated. Progressive consolidation within the right lung may reflect atelectasis or pneumonia. Multiple pulmonary nodules consistent with metastatic disease, some of which are now obscured by increased consolidation. 4. Left hilar adenopathy and right axillary adenopathy as before. Large  infiltrative mediastinal mass/ adenopathy, grossly unchanged. Mild irregular narrowing of the right mainstem bronchus by mediastinal mass. 5. Stable sclerosis within the cervical and thoracic spine, some of the findings in the cervical spine and upper thoracic vertebra are felt related to prominent venous enhancement. Electronically Signed   By: Donavan Foil M.D.   On: 11/24/2016 03:12   Dg Chest Port 1 View  Result Date: 11/24/2016 CLINICAL DATA:  Worsening dyspnea tonight. EXAM: PORTABLE CHEST 1 VIEW COMPARISON:  09/05/2016, 11/06/2016 FINDINGS: Right pleural effusion is probably unchanged from the CT of 11/06/2016. Right hilar opacity corresponds to the infiltrative mass in the medial right lung on recent CT. There also is a small to moderate left pleural effusion which is also probably enlarged. Pulmonary vasculature is normal. There is narrowing of the tracheal air column at the upper mediastinum and thoracic inlet. This probably is due to nodal disease in the mediastinum. IMPRESSION: 1. Bilateral pleural effusions probably larger on the left compared to the recent prior studies. Known infiltrative mass in  the medial right lung is probably unchanged. 2. Narrow transverse diameter of the tracheal air column, new from 09/05/2016 and possibly worsened from 11/06/2016. This probably is due to mediastinal nodal disease. Electronically Signed   By: Andreas Newport M.D.   On: 11/24/2016 00:21    Scheduled Meds: . azithromycin  500 mg Intravenous Q24H  . cefTRIAXone (ROCEPHIN)  IV  1 g Intravenous Q24H  . enoxaparin (LOVENOX) injection  30 mg Subcutaneous Daily  . iopamidol      . oseltamivir  75 mg Oral BID  . sodium chloride flush  3 mL Intravenous Q12H   Continuous Infusions: . sodium chloride 75 mL (11/24/16 0521)     LOS: 0 days   CHIU, Orpah Melter, MD [facility] Pager [phone]  If 7PM-7AM, please contact night-coverage [email or web address] Password [facility] 11/24/2016, 8:34 AM

## 2016-11-24 NOTE — ED Notes (Signed)
This RN attempted IV start and blood draw without success. Morey Hummingbird, RN attempted x1 without success. Lab requested to stick pt f

## 2016-11-24 NOTE — Progress Notes (Signed)
Rx Brief note:  Lovenox Wt=40 kg, CrCl~60 ml/min  Rx adjusted Lovenox to '30mg'$  daily in pt with wt<45 kg  Thanks Dorrene German 11/24/2016 4:56 AM

## 2016-11-24 NOTE — ED Notes (Signed)
No reaction to medication noted no respiratory or acute distress noted alert and oriented x 3 family at bedside call light in reach states feels better.

## 2016-11-24 NOTE — ED Notes (Signed)
Blood cultures x2 drawn prior to start of antibiotics. 

## 2016-11-24 NOTE — ED Notes (Signed)
Patient transported to CT 

## 2016-11-24 NOTE — ED Notes (Signed)
BLOOD DRAW UNSUCCESSFUL X2ATTEMPTS.  RN NOTIFIED.

## 2016-11-24 NOTE — ED Notes (Signed)
Gave EKG to Dr. Ralene Bathe, MD.

## 2016-11-25 ENCOUNTER — Inpatient Hospital Stay (HOSPITAL_COMMUNITY): Payer: BLUE CROSS/BLUE SHIELD

## 2016-11-25 ENCOUNTER — Ambulatory Visit
Admission: RE | Admit: 2016-11-25 | Discharge: 2016-11-25 | Disposition: A | Discharge status: Home / Self Care | Payer: BLUE CROSS/BLUE SHIELD | Source: Ambulatory Visit | Attending: Radiation Oncology | Admitting: Radiation Oncology

## 2016-11-25 DIAGNOSIS — E872 Acidosis: Secondary | ICD-10-CM

## 2016-11-25 DIAGNOSIS — D638 Anemia in other chronic diseases classified elsewhere: Secondary | ICD-10-CM

## 2016-11-25 DIAGNOSIS — I82210 Acute embolism and thrombosis of superior vena cava: Secondary | ICD-10-CM

## 2016-11-25 DIAGNOSIS — G893 Neoplasm related pain (acute) (chronic): Secondary | ICD-10-CM

## 2016-11-25 DIAGNOSIS — R0600 Dyspnea, unspecified: Secondary | ICD-10-CM

## 2016-11-25 DIAGNOSIS — J189 Pneumonia, unspecified organism: Secondary | ICD-10-CM

## 2016-11-25 LAB — GRAM STAIN

## 2016-11-25 LAB — BASIC METABOLIC PANEL
Anion gap: 7 (ref 5–15)
BUN: 21 mg/dL — AB (ref 6–20)
CHLORIDE: 93 mmol/L — AB (ref 101–111)
CO2: 26 mmol/L (ref 22–32)
Calcium: 9.2 mg/dL (ref 8.9–10.3)
Creatinine, Ser: 0.55 mg/dL — ABNORMAL LOW (ref 0.61–1.24)
Glucose, Bld: 99 mg/dL (ref 65–99)
POTASSIUM: 4.1 mmol/L (ref 3.5–5.1)
SODIUM: 126 mmol/L — AB (ref 135–145)

## 2016-11-25 LAB — BODY FLUID CELL COUNT WITH DIFFERENTIAL
LYMPHS FL: 81 %
MONOCYTE-MACROPHAGE-SEROUS FLUID: 18 % — AB (ref 50–90)
Neutrophil Count, Fluid: 1 % (ref 0–25)
Total Nucleated Cell Count, Fluid: 915 cu mm (ref 0–1000)

## 2016-11-25 LAB — LACTATE DEHYDROGENASE, PLEURAL OR PERITONEAL FLUID: LD, Fluid: 195 U/L — ABNORMAL HIGH (ref 3–23)

## 2016-11-25 LAB — PROTEIN, BODY FLUID: TOTAL PROTEIN, FLUID: 3.4 g/dL

## 2016-11-25 LAB — URINE CULTURE: Culture: NO GROWTH

## 2016-11-25 LAB — GLUCOSE, SEROUS FLUID: GLUCOSE FL: 111 mg/dL

## 2016-11-25 LAB — CBC
HEMATOCRIT: 27.7 % — AB (ref 39.0–52.0)
Hemoglobin: 9.3 g/dL — ABNORMAL LOW (ref 13.0–17.0)
MCH: 27 pg (ref 26.0–34.0)
MCHC: 33.6 g/dL (ref 30.0–36.0)
MCV: 80.5 fL (ref 78.0–100.0)
PLATELETS: 244 10*3/uL (ref 150–400)
RBC: 3.44 MIL/uL — AB (ref 4.22–5.81)
RDW: 14 % (ref 11.5–15.5)
WBC: 13.8 10*3/uL — AB (ref 4.0–10.5)

## 2016-11-25 LAB — LEGIONELLA PNEUMOPHILA SEROGP 1 UR AG: L. pneumophila Serogp 1 Ur Ag: NEGATIVE

## 2016-11-25 LAB — SODIUM, URINE, RANDOM: SODIUM UR: 75 mmol/L

## 2016-11-25 MED ORDER — MORPHINE SULFATE (PF) 2 MG/ML IV SOLN
2.0000 mg | INTRAVENOUS | Status: DC | PRN
  Administered 2016-11-26 – 2016-11-30 (×9): 2 mg via INTRAVENOUS
  Filled 2016-11-25 (×9): qty 1

## 2016-11-25 MED ORDER — MORPHINE SULFATE 2 MG/ML IJ SOLN: 2.0000 mg | INTRAMUSCULAR | Status: DC | PRN

## 2016-11-25 NOTE — Progress Notes (Signed)
PROGRESS NOTE    Bruce [name]  KVQ:[id number] DOB: 1961-10-13 DOA: 12/09/2016 PCP: No PCP Per Patient    Brief Narrative:  56 y.o. male with a past medical history significant for metastatic esophageal cancer c/b loculated R pleural effusion, SVC syndrome and tracheal compression on palliative chemorad who presents with sudden coughing.  Caveat that all history is collected through daughter interpreting, as patient does not speak [place] and declines telephonic interpreter to me tonight.  The patient had his routine irinotecan/cisplatin infusion Friday, and was recuperating today as usual the day after, when tonight he was at home with his wife and daughter when he started coughing, tried to cough up some phlegm but it seemed to choke him, and he had "a stroke" where his eyes rolled back and he seemed to be unable to breathe, so his wife called 9-1-1.  EMS found him SpO2 99% on room air, struggling to breathe, HR 150 and BP 140/94 and brought him in, gave Solu-medrol, Duo-neb en route.  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 Sepsis, suspect from pneumonia:  -Suspected source, possible pneumonia. -Patient meets criteria given tachycardia, tachypnea, leukocytosis, and evidence of organ dysfunction -flu negative. Have d/c'd Tamiflu -Patient is noted to be rhinovirus positive -Patient is continued on azithro and rocephin -Lactic acid has normalized -Leukocytosis normalized  2. Hyponatremia:  -Worsening, baseline Na of  ~130. -Sodium remains low, but stable -Will repeat bmet in  AM  3. Metastatic esophageal cancer:  -Continued morphine for pain, dyspnea -Oncology consulted, appreciate input. If ino improvement despite below thoracentesis, then consideration for hospice -CT suggests possible new SVC clot, will defer anticoagulation until after goals of care discussion with Oncology   4. Normocytic anemia:  -Suspect from chronic disease/cancer -Currently stable  5. Bilateral Large Pleural Effusion -Patient had failed attempt at thoracentesis in 6/17 -Most likely malignant effusion -Pulmonary following with recommendations for thoracentesis. Patient is now s/p thoracentesis 1/8, yielding just under 1L of fluid . Patient remains tachycardic and with tachypnea  DVT prophylaxis: Lovenox Code Status: Full Family Communication: Pt in room, family at bedside Disposition Plan: Uncertain at this time  Consultants:   Oncology  Critical Care  Procedures:   US guided thoracentesis 11/25/16  Antimicrobials: Anti-infectives    Start     Dose/Rate Route Frequency Ordered Stop   11/25/16 0600  vancomycin (VANCOCIN) IVPB 1000 mg/200 mL premix  Status:  Discontinued     1,000 mg 200 mL/hr over 60 Minutes Intravenous Every 24 hours 11/24/16 0300 11/24/16 0521   11/24/16 1000  piperacillin-tazobactam (ZOSYN) IVPB 3.375 g  Status:  Discontinued     3.375 g 12.5 mL/hr over 240 Minutes Intravenous Every 8 hours 11/24/16 0225 11/24/16 0521   11/24/16 1000  oseltamivir (TAMIFLU) capsule 75 mg  Status:  Discontinued     75 mg Oral 2 times daily 11/24/16 0449 11/24/16 1635   11/24/16 0800  cefTRIAXone (ROCEPHIN) 1 g in dextrose 5 % 50 mL IVPB     1 g 100 mL/hr over 30 Minutes Intravenous Every 24 hours 11/24/16 0514  11/24/16 0600  azithromycin (ZITHROMAX) 500 mg in dextrose 5 % 250 mL IVPB     500 mg 250 mL/hr over 60 Minutes Intravenous Every 24 hours 11/24/16 0513     11/24/16 0200  piperacillin-tazobactam (ZOSYN) IVPB 3.375 g     3.375 g 100 mL/hr over 30 Minutes  Intravenous  Once 11/24/16 0148 11/24/16 0250   11/24/16 0200  vancomycin (VANCOCIN) IVPB 1000 mg/200 mL premix     1,000 mg 200 mL/hr over 60 Minutes Intravenous  Once 11/24/16 0148 11/24/16 0401      Subjective: Without complaints  Objective: Vitals:   11/25/16 1300 11/25/16 1400 11/25/16 1500 11/25/16 1600  BP: (!) 81/57 (!) 75/52 (!) 142/88   Pulse: (!) 103 (!) 103  (!) 121  Resp: 17 18  (!) 27  Temp:      TempSrc:      SpO2: 100% 100%  96%  Weight:      Height:        Intake/Output Summary (Last 24 hours) at 11/25/16 1706 Last data filed at 11/25/16 1600  Gross per 24 hour  Intake          1518.75 ml  Output              775 ml  Net           743.75 ml   Filed Weights   11/24/16 0500  Weight: 43.3 kg (95 lb 7.4 oz)    Examination:  General exam: Laying in bed, in nad Respiratory system: increased resp effort, no audible wheezing Cardiovascular system: regular, s1, s2 Gastrointestinal system: pos bs, nondistended Central nervous system: cn2-12 grossly intact, strength intact Extremities: no clubbing, no cyanosis Skin: no pallor, no rashes Psychiatry: mood normal// no visual hallucinations.   Data Reviewed: I have personally reviewed following labs and imaging studies  CBC:  Recent Labs Lab 11/19/16 0908 11/24/16 0110 11/24/16 0620 11/25/16 0342  WBC 6.5 14.3* 10.1 13.8*  NEUTROABS 5.2 13.9*  --   --   HGB 11.3* 10.6* 9.8* 9.3*  HCT 34.0* 31.3* 28.0* 27.7*  MCV 83.6 80.3 79.3 80.5  PLT 300 262 240 017   Basic Metabolic Panel:  Recent Labs Lab 11/19/16 0908 11/24/16 0110 11/24/16 0620 11/25/16 0342  NA 131* 126* 127* 126*  K 3.5 4.2 3.8 4.1  CL  --  92* 93* 93*  CO2 '26 22 25 26  '$ GLUCOSE 118 200* 156* 99  BUN 9.'1 20 19 '$ 21*  CREATININE 0.8 0.64 0.60* 0.55*  CALCIUM 10.4 9.2 9.0 9.2  MG 2.2  --   --   --    GFR: Estimated Creatinine Clearance: 64.6 mL/min (by C-G formula based on SCr of 0.55 mg/dL (L)). Liver Function Tests:  Recent  Labs Lab 11/19/16 0908  AST 27  ALT 26  ALKPHOS 149  BILITOT 0.50  PROT 8.5*  ALBUMIN 2.9*   No results for input(s): LIPASE, AMYLASE in the last 168 hours. No results for input(s): AMMONIA in the last 168 hours. Coagulation Profile: No results for input(s): INR, PROTIME in the last 168 hours. Cardiac Enzymes: No results for input(s): CKTOTAL, CKMB, CKMBINDEX, TROPONINI in the last 168 hours. BNP (last 3 results) No results for input(s): PROBNP in the last 8760 hours. HbA1C: No results for input(s): HGBA1C in the last 72 hours. CBG: No results for input(s): GLUCAP in the last 168 hours. Lipid Profile: No results for input(s): CHOL, HDL, LDLCALC, TRIG, CHOLHDL, LDLDIRECT in the last 72 hours.  Thyroid Function Tests: No results for input(s): TSH, T4TOTAL, FREET4, T3FREE, THYROIDAB in the last 72 hours. Anemia Panel: No results for input(s): VITAMINB12, FOLATE, FERRITIN, TIBC, IRON, RETICCTPCT in the last 72 hours. Sepsis Labs:  Recent Labs Lab 11/24/16 0202 11/24/16 0620 11/24/16 0911  PROCALCITON  --  <0.10  --   LATICACIDVEN 6.75* 3.5* 1.9    Recent Results (from the past 240 hour(s))  Respiratory Panel by PCR     Status: Abnormal   Collection Time: 11/24/16  1:30 AM  Result Value Ref Range Status   Adenovirus NOT DETECTED NOT DETECTED Final   Coronavirus 229E NOT DETECTED NOT DETECTED Final   Coronavirus HKU1 NOT DETECTED NOT DETECTED Final   Coronavirus NL63 NOT DETECTED NOT DETECTED Final   Coronavirus OC43 NOT DETECTED NOT DETECTED Final   Metapneumovirus NOT DETECTED NOT DETECTED Final   Rhinovirus / Enterovirus DETECTED (A) NOT DETECTED Final   Influenza A NOT DETECTED NOT DETECTED Final   Influenza B NOT DETECTED NOT DETECTED Final   Parainfluenza Virus 1 NOT DETECTED NOT DETECTED Final   Parainfluenza Virus 2 NOT DETECTED NOT DETECTED Final   Parainfluenza Virus 3 NOT DETECTED NOT DETECTED Final   Parainfluenza Virus 4 NOT DETECTED NOT DETECTED Final    Respiratory Syncytial Virus NOT DETECTED NOT DETECTED Final   Bordetella pertussis NOT DETECTED NOT DETECTED Final   Chlamydophila pneumoniae NOT DETECTED NOT DETECTED Final   Mycoplasma pneumoniae NOT DETECTED NOT DETECTED Final    Comment: Performed at [facility]  Blood Culture (routine x 2)     Status: None (Preliminary result)   Collection Time: 11/24/16  2:10 AM  Result Value Ref Range Status   Specimen Description BLOOD LEFT HAND  Final   Special Requests BOTTLES DRAWN AEROBIC AND ANAEROBIC 5ML  Final   Culture   Final    NO GROWTH 1 DAY Performed at [facility]    Report Status PENDING  Incomplete  Blood Culture (routine x 2)     Status: None (Preliminary result)   Collection Time: 11/24/16  2:12 AM  Result Value Ref Range Status   Specimen Description BLOOD LEFT HAND  Final   Special Requests BOTTLES DRAWN AEROBIC AND ANAEROBIC 5ML  Final   Culture   Final    NO GROWTH 1 DAY Performed at [facility]    Report Status PENDING  Incomplete  Urine culture     Status: None   Collection Time: 11/24/16  3:33 AM  Result Value Ref Range Status   Specimen Description URINE, CLEAN CATCH  Final   Special Requests NONE  Final   Culture NO GROWTH Performed at [facility]   Final   Report Status 11/25/2016 FINAL  Final  MRSA PCR Screening     Status: None   Collection Time: 11/24/16  5:41 AM  Result Value Ref Range Status   MRSA by PCR NEGATIVE NEGATIVE Final    Comment:        The GeneXpert MRSA Assay (FDA approved for NASAL specimens only), is one component of a comprehensive MRSA colonization surveillance program. It is not intended to diagnose MRSA infection nor to guide or monitor treatment for MRSA infections.   Gram stain     Status: None   Collection Time: 11/25/16 10:27 AM  Result Value Ref Range Status   Specimen Description PLEURAL  Final   Special Requests NONE  Final   Gram Stain   Final    FEW  WBC PRESENT, PREDOMINANTLY  MONONUCLEAR NO ORGANISMS SEEN Performed at [facility]    Report Status 11/25/2016 FINAL  Final     Radiology Studies: Dg Chest 1 View  Result Date: 11/25/2016 CLINICAL DATA:  Status post thoracentesis EXAM: CHEST 1 VIEW COMPARISON:  11/22/2016 FINDINGS: Mild cardiac enlargement. There is a moderate right pleural effusion with diminished aeration to the right lung. The appearance is similar to previous exam. Left pleural effusion has resolved in the interval. IMPRESSION: 1. Persistent moderate right pleural effusion. 2. Decrease in left effusion. Electronically Signed   By: Kerby Moors M.D.   On: 11/25/2016 10:43   Ct Angio Chest Pe W Or Wo Contrast  Result Date: 11/24/2016 CLINICAL DATA:  Increased shortness of breath history of esophageal cancer with lung metastases EXAM: CT ANGIOGRAPHY CHEST WITH CONTRAST TECHNIQUE: Multidetector CT imaging of the chest was performed using the standard protocol during bolus administration of intravenous contrast. Multiplanar CT image reconstructions and MIPs were obtained to evaluate the vascular anatomy. CONTRAST:  80 mL Isovue 370 intravenous COMPARISON:  Chest x-ray 12/03/2016, CT chest 11/06/2016 FINDINGS: Cardiovascular: Non aneurysmal aorta. No dissection is seen. Numerous collateral vessels within the left chest wall and paraspinous area. Severe narrowing of the brachiocephalic vessels and SVC due to mediastinal mass. Suspect thrombus within the distal SVC just above the right atrium. Severe diffuse narrowing/ encasement of the right pulmonary artery by mediastinal mass. Marked narrowing of right upper lobe pulmonary arteries with minimal distal enhancement noted. No gross embolus identified within the right lower lobe pulmonary arteries. Mild narrowing of the left pulmonary artery by a mediastinal mass. No gross central embolus identified on the left. Heart size nonenlarged. No gross effusion Mediastinum/Nodes: Ill-defined infiltrative soft tissue  mass/adenopathy filling the mediastinum. The mass surrounds the great vessels of the aortic arch and the trachea with irregular narrowing of the right bronchi. Mass encases the central right greater than left pulmonary arteries. Left hilar adenopathy is again visualized. Patient is status post esophagectomy with gastric pull-through. Multiple enlarged right axillary nodes. Lungs/Pleura: Large bilateral pleural effusions, increased compared to prior. Mild rim enhancement around the right pleural collection. Emphasis mid disc disease is present. Multiple lung nodules again visualized consistent with metastatic disease. Increasing consolidation within the right lower lobe. No pneumothorax Upper Abdomen: Postsurgical changes of the stomach. Heterogenous enhancement of the liver. Spleen grossly unremarkable. Musculoskeletal: Stable skeletal structures with patchy areas of sclerosis within lower thoracic vertebra. Patchy sclerosis within upper thoracic and lower cervical vertebra part of which is related to vascular enhancement. Review of the MIP images confirms the above findings. IMPRESSION: 1. No gross central embolus is visualized. There is severe narrowing of the right pulmonary artery by mediastinal mass with severe narrowing and attenuation of the right upper lobe pulmonary arterial branches. 2. Severe narrowing of the brachiocephalic vessels and SVC by mediastinal tumor. Suspect that there is a small amount of thrombus in the distal SVC above the right atrium. Numerous paraspinal, mediastinal and left chest wall collaterals are again visualized. 3. Large bilateral pleural effusions, increased on the left compared to prior. Right pleural collection is loculated. Progressive consolidation within the right lung may reflect atelectasis or pneumonia. Multiple pulmonary nodules consistent with metastatic disease, some of which are now obscured by increased consolidation. 4. Left hilar adenopathy and right axillary  adenopathy as before. Large infiltrative mediastinal mass/ adenopathy, grossly unchanged. Mild irregular narrowing of the right mainstem bronchus by mediastinal mass. 5. Stable sclerosis within the cervical and  thoracic spine, some of the findings in the cervical spine and upper thoracic vertebra are felt related to prominent venous enhancement. Electronically Signed   By: Donavan Foil M.D.   On: 11/24/2016 03:12   Dg Chest Port 1 View  Result Date: 11/24/2016 CLINICAL DATA:  Worsening dyspnea tonight. EXAM: PORTABLE CHEST 1 VIEW COMPARISON:  09/05/2016, 11/06/2016 FINDINGS: Right pleural effusion is probably unchanged from the CT of 11/06/2016. Right hilar opacity corresponds to the infiltrative mass in the medial right lung on recent CT. There also is a small to moderate left pleural effusion which is also probably enlarged. Pulmonary vasculature is normal. There is narrowing of the tracheal air column at the upper mediastinum and thoracic inlet. This probably is due to nodal disease in the mediastinum. IMPRESSION: 1. Bilateral pleural effusions probably larger on the left compared to the recent prior studies. Known infiltrative mass in the medial right lung is probably unchanged. 2. Narrow transverse diameter of the tracheal air column, new from 09/05/2016 and possibly worsened from 11/06/2016. This probably is due to mediastinal nodal disease. Electronically Signed   By: Andreas Newport M.D.   On: 11/24/2016 00:21   US Thoracentesis Asp Pleural Space W/img Guide  Result Date: 11/25/2016 INDICATION: Patient with history of metastatic esophageal cancer, bilateral pleural effusions left greater than right. Request made for diagnostic and therapeutic left thoracentesis. EXAM: ULTRASOUND GUIDED DIAGNOSTIC AND THERAPEUTIC LEFT THORACENTESIS MEDICATIONS: None. COMPLICATIONS: None immediate. PROCEDURE: An ultrasound guided thoracentesis was thoroughly discussed with the patient and questions answered. The  benefits, risks, alternatives and complications were also discussed. The patient understands and wishes to proceed with the procedure. Written consent was obtained. Ultrasound was performed to localize and mark an adequate pocket of fluid in the left chest. The area was then prepped and draped in the normal sterile fashion. 1% Lidocaine was used for local anesthesia. Under ultrasound guidance a Safe-T-Centesis catheter was introduced. Thoracentesis was performed. The catheter was removed and a dressing applied. FINDINGS: A total of approximately 910 cc of yellow fluid was removed. Samples were sent to the laboratory as requested by the clinical team. Patient's right pleural effusion was extensively multiloculated, therefore decision made to tap larger left effusion at this time. IMPRESSION: Successful ultrasound guided diagnostic and therapeutic left thoracentesis yielding 910 cc of pleural fluid. Read by: Rowe Robert, PA-C Electronically Signed   By: Sandi Mariscal M.D.   On: 11/25/2016 11:39    Scheduled Meds: . azithromycin  500 mg Intravenous Q24H  . cefTRIAXone (ROCEPHIN)  IV  1 g Intravenous Q24H  . chlorhexidine  15 mL Mouth Rinse BID  . enoxaparin (LOVENOX) injection  30 mg Subcutaneous Daily  . feeding supplement (ENSURE ENLIVE)  237 mL Oral BID BM  . mouth rinse  15 mL Mouth Rinse q12n4p  . sodium chloride flush  3 mL Intravenous Q12H   Continuous Infusions: . sodium chloride 75 mL/hr at 11/25/16 1030     LOS: 1 day   CHIU, Orpah Melter, MD [facility] Pager [phone]  If 7PM-7AM, please contact night-coverage [email or web address] Password [email or web address] 11/25/2016, 5:06 PM

## 2016-11-25 NOTE — Progress Notes (Signed)
SLP Cancellation Note  Patient Details Name: Bruce [name] MRN: [id number] DOB: 01-16-1961   Cancelled treatment:       Orders received for bedside swallow evaluation. Chart reviewed and history taken. Unable to complete BSE at this time, as pt is currently off unit for testing. Will continue efforts.    Celia B. [place], [facility], CCC-SLP [phone]  Shonna Chock 11/25/2016, 10:08 AM

## 2016-11-25 NOTE — Progress Notes (Addendum)
IP PROGRESS NOTE  Subjective:   [name] is well-known to me with a history of metastatic office cancer. He recently developed disease progression in the chest including upper chest adenopathy causing SVC syndrome. He started palliative radiation to the chest last week and salvage irinotecan/cisplatin chemotherapy 11/22/2016.  He presented to the emergency room with increased shortness of breath on the evening of 11/28/2016 with increased dyspnea. A chest x-ray revealed increased bilateral effusions and narrowing of the tracheal air column. A CT of the chest 11/24/2016 confirmed crease pleural effusions and increased consolidation in the right lower lobe. Severe narrowing was noted of the right pulmonary artery and SVC by tumor.  He reports feeling better today. His daughter is at the bedside.  Objective: Vital signs in last 24 hours: Blood pressure (!) 79/43, pulse (!) 108, temperature 97.6 F (36.4 C), temperature source Oral, resp. rate 11, height '5\' 3"'$  (1.6 m), weight 95 lb 7.4 oz (43.3 kg), SpO2 100 %.  Intake/Output from previous day: 01/07 0701 - 01/08 0700 In: 2200 [I.V.:1650; IV Piggyback:550] Out: 900 [Urine:900]  Physical Exam:  HEENT:  the face and neck are swollen, no thrush Lungs:  decreased breath sounds over the right chest, increased respiratory rate Cardiac:  regular rate and rhythm Abdomen:  no hepatomegaly, nontender Extremities:  no leg edema, 1+ edema in both arms   Portacath/PICC-without erythema  Lab Results:  Recent Labs  11/24/16 0620 11/25/16 0342  WBC 10.1 13.8*  HGB 9.8* 9.3*  HCT 28.0* 27.7*  PLT 240 244    BMET  Recent Labs  11/24/16 0620 11/25/16 0342  NA 127* 126*  K 3.8 4.1  CL 93* 93*  CO2 25 26  GLUCOSE 156* 99  BUN 19 21*  CREATININE 0.60* 0.55*  CALCIUM 9.0 9.2    Studies/Results: Dg Chest 1 View  Result Date: 11/25/2016 CLINICAL DATA:  Status post thoracentesis EXAM: CHEST 1 VIEW COMPARISON:  12/06/2016 FINDINGS:  Mild cardiac enlargement. There is a moderate right pleural effusion with diminished aeration to the right lung. The appearance is similar to previous exam. Left pleural effusion has resolved in the interval. IMPRESSION: 1. Persistent moderate right pleural effusion. 2. Decrease in left effusion. Electronically Signed   By: Kerby Moors M.D.   On: 11/25/2016 10:43   Ct Angio Chest Pe W Or Wo Contrast  Result Date: 11/24/2016 CLINICAL DATA:  Increased shortness of breath history of esophageal cancer with lung metastases EXAM: CT ANGIOGRAPHY CHEST WITH CONTRAST TECHNIQUE: Multidetector CT imaging of the chest was performed using the standard protocol during bolus administration of intravenous contrast. Multiplanar CT image reconstructions and MIPs were obtained to evaluate the vascular anatomy. CONTRAST:  80 mL Isovue 370 intravenous COMPARISON:  Chest x-ray 12/08/2016, CT chest 11/06/2016 FINDINGS: Cardiovascular: Non aneurysmal aorta. No dissection is seen. Numerous collateral vessels within the left chest wall and paraspinous area. Severe narrowing of the brachiocephalic vessels and SVC due to mediastinal mass. Suspect thrombus within the distal SVC just above the right atrium. Severe diffuse narrowing/ encasement of the right pulmonary artery by mediastinal mass. Marked narrowing of right upper lobe pulmonary arteries with minimal distal enhancement noted. No gross embolus identified within the right lower lobe pulmonary arteries. Mild narrowing of the left pulmonary artery by a mediastinal mass. No gross central embolus identified on the left. Heart size nonenlarged. No gross effusion Mediastinum/Nodes: Ill-defined infiltrative soft tissue mass/adenopathy filling the mediastinum. The mass surrounds the great vessels of the aortic arch and the trachea with irregular  narrowing of the right bronchi. Mass encases the central right greater than left pulmonary arteries. Left hilar adenopathy is again visualized.  Patient is status post esophagectomy with gastric pull-through. Multiple enlarged right axillary nodes. Lungs/Pleura: Large bilateral pleural effusions, increased compared to prior. Mild rim enhancement around the right pleural collection. Emphasis mid disc disease is present. Multiple lung nodules again visualized consistent with metastatic disease. Increasing consolidation within the right lower lobe. No pneumothorax Upper Abdomen: Postsurgical changes of the stomach. Heterogenous enhancement of the liver. Spleen grossly unremarkable. Musculoskeletal: Stable skeletal structures with patchy areas of sclerosis within lower thoracic vertebra. Patchy sclerosis within upper thoracic and lower cervical vertebra part of which is related to vascular enhancement. Review of the MIP images confirms the above findings. IMPRESSION: 1. No gross central embolus is visualized. There is severe narrowing of the right pulmonary artery by mediastinal mass with severe narrowing and attenuation of the right upper lobe pulmonary arterial branches. 2. Severe narrowing of the brachiocephalic vessels and SVC by mediastinal tumor. Suspect that there is a small amount of thrombus in the distal SVC above the right atrium. Numerous paraspinal, mediastinal and left chest wall collaterals are again visualized. 3. Large bilateral pleural effusions, increased on the left compared to prior. Right pleural collection is loculated. Progressive consolidation within the right lung may reflect atelectasis or pneumonia. Multiple pulmonary nodules consistent with metastatic disease, some of which are now obscured by increased consolidation. 4. Left hilar adenopathy and right axillary adenopathy as before. Large infiltrative mediastinal mass/ adenopathy, grossly unchanged. Mild irregular narrowing of the right mainstem bronchus by mediastinal mass. 5. Stable sclerosis within the cervical and thoracic spine, some of the findings in the cervical spine and  upper thoracic vertebra are felt related to prominent venous enhancement. Electronically Signed   By: Donavan Foil M.D.   On: 11/24/2016 03:12   Dg Chest Port 1 View  Result Date: 11/24/2016 CLINICAL DATA:  Worsening dyspnea tonight. EXAM: PORTABLE CHEST 1 VIEW COMPARISON:  09/05/2016, 11/06/2016 FINDINGS: Right pleural effusion is probably unchanged from the CT of 11/06/2016. Right hilar opacity corresponds to the infiltrative mass in the medial right lung on recent CT. There also is a small to moderate left pleural effusion which is also probably enlarged. Pulmonary vasculature is normal. There is narrowing of the tracheal air column at the upper mediastinum and thoracic inlet. This probably is due to nodal disease in the mediastinum. IMPRESSION: 1. Bilateral pleural effusions probably larger on the left compared to the recent prior studies. Known infiltrative mass in the medial right lung is probably unchanged. 2. Narrow transverse diameter of the tracheal air column, new from 09/05/2016 and possibly worsened from 11/06/2016. This probably is due to mediastinal nodal disease. Electronically Signed   By: Andreas Newport M.D.   On: 11/24/2016 00:21   US Thoracentesis Asp Pleural Space W/img Guide  Result Date: 11/25/2016 INDICATION: Patient with history of metastatic esophageal cancer, bilateral pleural effusions left greater than right. Request made for diagnostic and therapeutic left thoracentesis. EXAM: ULTRASOUND GUIDED DIAGNOSTIC AND THERAPEUTIC LEFT THORACENTESIS MEDICATIONS: None. COMPLICATIONS: None immediate. PROCEDURE: An ultrasound guided thoracentesis was thoroughly discussed with the patient and questions answered. The benefits, risks, alternatives and complications were also discussed. The patient understands and wishes to proceed with the procedure. Written consent was obtained. Ultrasound was performed to localize and mark an adequate pocket of fluid in the left chest. The area was then  prepped and draped in the normal sterile fashion. 1% Lidocaine was  used for local anesthesia. Under ultrasound guidance a Safe-T-Centesis catheter was introduced. Thoracentesis was performed. The catheter was removed and a dressing applied. FINDINGS: A total of approximately 910 cc of yellow fluid was removed. Samples were sent to the laboratory as requested by the clinical team. Patient's right pleural effusion was extensively multiloculated, therefore decision made to tap larger left effusion at this time. IMPRESSION: Successful ultrasound guided diagnostic and therapeutic left thoracentesis yielding 910 cc of pleural fluid. Read by: Rowe Robert, PA-C Electronically Signed   By: Sandi Mariscal M.D.   On: 11/25/2016 11:39    Medications: I have reviewed the patient's current medications.  Assessment/Plan:  1.metastatic esophagus cancer-progressive disease in the chest confirmed on CT 11/06/2016    palliative chest radiation and initiated 11/19/2016  Salvage weekly irinotecan/cisplatin chemotherapy beginning 11/22/00/2018  2. Admission 11/24/2016 with respiratory failure-likely secondary to tumor progression in the lungs and increased effusions 3. Chest/back pain secondary to #1 4. Anemia secondary to chronic disease 5. 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 is admitted with respiratory failure. His symptoms are likely secondary to progressive disease in the chest including bilateral pleural effusions. He is currently completing palliative radiation for the SVC compression and salvage chemotherapy with irinotecan/cisplatin. I think it is reasonable to attempt palliative thoracentesis procedures to see if this helps the dyspnea. If not we will need to consider full comfort care with a Hospice referral. I have previously discussed hospice with the patient and his daughter. He desires a trial of salvage therapy and if this is not helpful he will agree to hospice.  I will add IV morphine to use as  needed for dyspnea and pain.  I appreciate the care from Dr. Wyline Copas. I will continue following [name] daily.   LOS: 1 day   Betsy Coder, MD   11/25/2016, 1:34 PM

## 2016-11-25 NOTE — Progress Notes (Signed)
Name: Bruce [name] MRN: [id number] DOB: [birth year]    ADMISSION DATE:  12/04/2016 CONSULTATION DATE:  11/25/2016  REFERRING MD :  Chiu,Triad  CHIEF COMPLAINT:  Pleural effusions  HISTORY OF PRESENT ILLNESS:  56 year old [place] man in the [place] for 14 years was diagnosed with esophageal cancer in 2013 which is now unfortunately metastasized to his lungs and mediastinum with pleural effusions and significant lymphadenopathy and SVC obstruction as noted by his oncologist on a CT scan in 11/06/16. He was started on palliative radiation to the chest lymphadenopathy and weakly salvage therapy withirinotecan and cisplatin. He has a history of esophagectomy in 2014 and is supposed to be on a dysphagia diet. History is obtained from his daughter -he had his chemotherapy on Friday and started suddenly coughing in the evening with an apparent choking episode and his eyes rolled back which made him call EMS In the ED was noted to be tachycardic and hypoxic requiring 5 L with mild leukocytosis, sodium of 126 and a lactate of 6.7. We are asked to comment  on the finding of loculated effusions noted on his CT scan  SIGNIFICANT EVENTS  1/5 chemotherapy  STUDIES:  CT angio 1/6 >> no PE but did show large loculated right effusion, moderate left effusion, known severe SVC and brachiocephalic narrowing, new severe pulmonary artery narrowing and progressive consolidation in right lung as well as irregular narrowing of right mainstem bronchus     SUBJECTIVE: denies CP, dyspnea  daughter translates  VITAL SIGNS: Temp:  [97.6 F (36.4 C)-98.4 F (36.9 C)] 97.6 F (36.4 C) (01/08 1200) Pulse Rate:  [105-131] 108 (01/08 1200) Resp:  [11-36] 11 (01/08 1200) BP: (79-128)/(43-83) 79/43 (01/08 1200) SpO2:  [93 %-100 %] 100 % (01/08 1200)  PHYSICAL EXAMINATION: Gen. Asian, Pleasant, well-nourished, in no distress, normal affect ENT -  no post nasal drip Neck: No JVD, no thyromegaly, no carotid  bruits Lungs: no use of accessory muscles, no dullness to percussion, decreased bilateral without rales or rhonchi  Cardiovascular: Rhythm regular, heart sounds  normal, no murmurs, no peripheral edema Abdomen: soft and non-tender, no hepatosplenomegaly, BS normal. Musculoskeletal: No deformities, no cyanosis or clubbing Neuro:  alert, non focal Skin:  Warm, no lesions/ rash    Recent Labs Lab 11/24/16 0110 11/24/16 0620 11/25/16 0342  NA 126* 127* 126*  K 4.2 3.8 4.1  CL 92* 93* 93*  CO2 '22 25 26  '$ BUN 20 19 21*  CREATININE 0.64 0.60* 0.55*  GLUCOSE 200* 156* 99    Recent Labs Lab 11/24/16 0110 11/24/16 0620 11/25/16 0342  HGB 10.6* 9.8* 9.3*  HCT 31.3* 28.0* 27.7*  WBC 14.3* 10.1 13.8*  PLT 262 240 244   Dg Chest 1 View  Result Date: 11/25/2016 CLINICAL DATA:  Status post thoracentesis EXAM: CHEST 1 VIEW COMPARISON:  12/16/2016 FINDINGS: Mild cardiac enlargement. There is a moderate right pleural effusion with diminished aeration to the right lung. The appearance is similar to previous exam. Left pleural effusion has resolved in the interval. IMPRESSION: 1. Persistent moderate right pleural effusion. 2. Decrease in left effusion. Electronically Signed   By: Kerby Moors M.D.   On: 11/25/2016 10:43   Ct Angio Chest Pe W Or Wo Contrast  Result Date: 11/24/2016 CLINICAL DATA:  Increased shortness of breath history of esophageal cancer with lung metastases EXAM: CT ANGIOGRAPHY CHEST WITH CONTRAST TECHNIQUE: Multidetector CT imaging of the chest was performed using the standard protocol during bolus administration of intravenous contrast. Multiplanar CT image  reconstructions and MIPs were obtained to evaluate the vascular anatomy. CONTRAST:  80 mL Isovue 370 intravenous COMPARISON:  Chest x-ray 12/07/2016, CT chest 11/06/2016 FINDINGS: Cardiovascular: Non aneurysmal aorta. No dissection is seen. Numerous collateral vessels within the left chest wall and paraspinous area. Severe  narrowing of the brachiocephalic vessels and SVC due to mediastinal mass. Suspect thrombus within the distal SVC just above the right atrium. Severe diffuse narrowing/ encasement of the right pulmonary artery by mediastinal mass. Marked narrowing of right upper lobe pulmonary arteries with minimal distal enhancement noted. No gross embolus identified within the right lower lobe pulmonary arteries. Mild narrowing of the left pulmonary artery by a mediastinal mass. No gross central embolus identified on the left. Heart size nonenlarged. No gross effusion Mediastinum/Nodes: Ill-defined infiltrative soft tissue mass/adenopathy filling the mediastinum. The mass surrounds the great vessels of the aortic arch and the trachea with irregular narrowing of the right bronchi. Mass encases the central right greater than left pulmonary arteries. Left hilar adenopathy is again visualized. Patient is status post esophagectomy with gastric pull-through. Multiple enlarged right axillary nodes. Lungs/Pleura: Large bilateral pleural effusions, increased compared to prior. Mild rim enhancement around the right pleural collection. Emphasis mid disc disease is present. Multiple lung nodules again visualized consistent with metastatic disease. Increasing consolidation within the right lower lobe. No pneumothorax Upper Abdomen: Postsurgical changes of the stomach. Heterogenous enhancement of the liver. Spleen grossly unremarkable. Musculoskeletal: Stable skeletal structures with patchy areas of sclerosis within lower thoracic vertebra. Patchy sclerosis within upper thoracic and lower cervical vertebra part of which is related to vascular enhancement. Review of the MIP images confirms the above findings. IMPRESSION: 1. No gross central embolus is visualized. There is severe narrowing of the right pulmonary artery by mediastinal mass with severe narrowing and attenuation of the right upper lobe pulmonary arterial branches. 2. Severe narrowing  of the brachiocephalic vessels and SVC by mediastinal tumor. Suspect that there is a small amount of thrombus in the distal SVC above the right atrium. Numerous paraspinal, mediastinal and left chest wall collaterals are again visualized. 3. Large bilateral pleural effusions, increased on the left compared to prior. Right pleural collection is loculated. Progressive consolidation within the right lung may reflect atelectasis or pneumonia. Multiple pulmonary nodules consistent with metastatic disease, some of which are now obscured by increased consolidation. 4. Left hilar adenopathy and right axillary adenopathy as before. Large infiltrative mediastinal mass/ adenopathy, grossly unchanged. Mild irregular narrowing of the right mainstem bronchus by mediastinal mass. 5. Stable sclerosis within the cervical and thoracic spine, some of the findings in the cervical spine and upper thoracic vertebra are felt related to prominent venous enhancement. Electronically Signed   By: Donavan Foil M.D.   On: 11/24/2016 03:12   Dg Chest Port 1 View  Result Date: 11/24/2016 CLINICAL DATA:  Worsening dyspnea tonight. EXAM: PORTABLE CHEST 1 VIEW COMPARISON:  09/05/2016, 11/06/2016 FINDINGS: Right pleural effusion is probably unchanged from the CT of 11/06/2016. Right hilar opacity corresponds to the infiltrative mass in the medial right lung on recent CT. There also is a small to moderate left pleural effusion which is also probably enlarged. Pulmonary vasculature is normal. There is narrowing of the tracheal air column at the upper mediastinum and thoracic inlet. This probably is due to nodal disease in the mediastinum. IMPRESSION: 1. Bilateral pleural effusions probably larger on the left compared to the recent prior studies. Known infiltrative mass in the medial right lung is probably unchanged. 2. Narrow transverse diameter  of the tracheal air column, new from 09/05/2016 and possibly worsened from 11/06/2016. This probably is  due to mediastinal nodal disease. Electronically Signed   By: Andreas Newport M.D.   On: 11/24/2016 00:21   US Thoracentesis Asp Pleural Space W/img Guide  Result Date: 11/25/2016 INDICATION: Patient with history of metastatic esophageal cancer, bilateral pleural effusions left greater than right. Request made for diagnostic and therapeutic left thoracentesis. EXAM: ULTRASOUND GUIDED DIAGNOSTIC AND THERAPEUTIC LEFT THORACENTESIS MEDICATIONS: None. COMPLICATIONS: None immediate. PROCEDURE: An ultrasound guided thoracentesis was thoroughly discussed with the patient and questions answered. The benefits, risks, alternatives and complications were also discussed. The patient understands and wishes to proceed with the procedure. Written consent was obtained. Ultrasound was performed to localize and mark an adequate pocket of fluid in the left chest. The area was then prepped and draped in the normal sterile fashion. 1% Lidocaine was used for local anesthesia. Under ultrasound guidance a Safe-T-Centesis catheter was introduced. Thoracentesis was performed. The catheter was removed and a dressing applied. FINDINGS: A total of approximately 910 cc of yellow fluid was removed. Samples were sent to the laboratory as requested by the clinical team. Patient's right pleural effusion was extensively multiloculated, therefore decision made to tap larger left effusion at this time. IMPRESSION: Successful ultrasound guided diagnostic and therapeutic left thoracentesis yielding 910 cc of pleural fluid. Read by: Rowe Robert, PA-C Electronically Signed   By: Sandi Mariscal M.D.   On: 11/25/2016 11:39    ASSESSMENT / PLAN:  Acute respiratory failure-which is now improved, he is on 2 L nasal cannula Episode  causing admission seems to be related to aspiration - Would recommend swallow evaluation given likely aspiration episodes and history of esophagectomy  Lactic acidosis is resolved-  I would doubt sepsis syndrome given Low  procalcitonin and resolution of leukocytosis  Bilateral effusions -right was tapped in 04/2016, only 25 mL removed for diagnostic purposes and cytology was negative. Effusions are very likely malignant, right is multiloculated, left was tapped today and appears to be a lymphocytic exudate -does not appear infected  Unfortunately his cancer seems to be progressing with evidence of SVC obstruction on his CAT scan although this is not clinically apparent- reviewed oncology note, hospice is being discussed  [facility] M available as needed  Kara Mead MD. FCCP. [facility] Pager [phone] If no response call [phone]    11/25/2016, 1:57 PM

## 2016-11-25 NOTE — Procedures (Signed)
Ultrasound-guided diagnostic and therapeutic left thoracentesis performed yielding 910 cc of yellow fluid. No immediate complications. Follow-up chest x-ray pending. The fluid was sent to the lab for preordered studies.  Right pleural collection was extensively multiloculated on today's US exam, therefore larger left effusion was drained after d/w Dr. Elsworth Soho.

## 2016-11-26 ENCOUNTER — Ambulatory Visit
Admission: RE | Admit: 2016-11-26 | Discharge: 2016-11-26 | Disposition: A | Discharge status: Home / Self Care | Payer: BLUE CROSS/BLUE SHIELD | Source: Ambulatory Visit | Attending: Radiation Oncology | Admitting: Radiation Oncology

## 2016-11-26 ENCOUNTER — Other Ambulatory Visit: Payer: BLUE CROSS/BLUE SHIELD

## 2016-11-26 ENCOUNTER — Ambulatory Visit: Payer: BLUE CROSS/BLUE SHIELD

## 2016-11-26 DIAGNOSIS — R05 Cough: Secondary | ICD-10-CM

## 2016-11-26 DIAGNOSIS — A419 Sepsis, unspecified organism: Principal | ICD-10-CM

## 2016-11-26 DIAGNOSIS — C799 Secondary malignant neoplasm of unspecified site: Secondary | ICD-10-CM

## 2016-11-26 DIAGNOSIS — I959 Hypotension, unspecified: Secondary | ICD-10-CM

## 2016-11-26 DIAGNOSIS — I871 Compression of vein: Secondary | ICD-10-CM

## 2016-11-26 DIAGNOSIS — J90 Pleural effusion, not elsewhere classified: Secondary | ICD-10-CM

## 2016-11-26 DIAGNOSIS — B348 Other viral infections of unspecified site: Secondary | ICD-10-CM

## 2016-11-26 DIAGNOSIS — C159 Malignant neoplasm of esophagus, unspecified: Secondary | ICD-10-CM

## 2016-11-26 LAB — CORTISOL: Cortisol, Plasma: 29.4 ug/dL

## 2016-11-26 LAB — BASIC METABOLIC PANEL
Anion gap: 6 (ref 5–15)
BUN: 18 mg/dL (ref 6–20)
CALCIUM: 8.9 mg/dL (ref 8.9–10.3)
CO2: 30 mmol/L (ref 22–32)
Chloride: 90 mmol/L — ABNORMAL LOW (ref 101–111)
Creatinine, Ser: 0.51 mg/dL — ABNORMAL LOW (ref 0.61–1.24)
GFR calc Af Amer: >60 mL/min (ref >60)
GLUCOSE: 100 mg/dL — AB (ref 65–99)
Potassium: 3.7 mmol/L (ref 3.5–5.1)
Sodium: 126 mmol/L — ABNORMAL LOW (ref 135–145)

## 2016-11-26 LAB — CBC
HCT: 29.1 % — ABNORMAL LOW (ref 39.0–52.0)
Hemoglobin: 9.8 g/dL — ABNORMAL LOW (ref 13.0–17.0)
MCH: 27.5 pg (ref 26.0–34.0)
MCHC: 33.7 g/dL (ref 30.0–36.0)
MCV: 81.5 fL (ref 78.0–100.0)
PLATELETS: 271 10*3/uL (ref 150–400)
RBC: 3.57 MIL/uL — ABNORMAL LOW (ref 4.22–5.81)
RDW: 13.9 % (ref 11.5–15.5)
WBC: 12.3 10*3/uL — ABNORMAL HIGH (ref 4.0–10.5)

## 2016-11-26 LAB — PH, BODY FLUID: pH, Body Fluid: 7.8

## 2016-11-26 LAB — PROCALCITONIN: PROCALCITONIN: 0.11 ng/mL

## 2016-11-26 MED ORDER — SODIUM CHLORIDE 0.9 % IV BOLUS (SEPSIS)
1000.0000 mL | Freq: Once | INTRAVENOUS | Status: AC
  Administered 2016-11-26: 1000 mL via INTRAVENOUS

## 2016-11-26 MED ORDER — HYDROCORTISONE NA SUCCINATE PF 100 MG IJ SOLR
50.0000 mg | Freq: Four times a day (QID) | INTRAMUSCULAR | Status: DC
  Administered 2016-11-26 – 2016-11-27 (×4): 50 mg via INTRAVENOUS
  Filled 2016-11-26 (×4): qty 2

## 2016-11-26 MED ORDER — BOOST / RESOURCE BREEZE PO LIQD
1.0000 | Freq: Two times a day (BID) | ORAL | Status: DC
  Administered 2016-11-26 – 2016-11-28 (×2): 1 via ORAL

## 2016-11-26 MED ORDER — GUAIFENESIN ER 600 MG PO TB12
600.0000 mg | ORAL_TABLET | Freq: Two times a day (BID) | ORAL | Status: DC
  Administered 2016-11-26 – 2016-11-30 (×8): 600 mg via ORAL
  Filled 2016-11-26 (×8): qty 1

## 2016-11-26 MED ORDER — ADULT MULTIVITAMIN W/MINERALS CH
1.0000 | ORAL_TABLET | Freq: Every day | ORAL | Status: DC
  Administered 2016-11-27 – 2016-11-30 (×4): 1 via ORAL
  Filled 2016-11-26 (×6): qty 1

## 2016-11-26 MED ORDER — PHENYLEPHRINE HCL 10 MG/ML IJ SOLN
0.0000 ug/min | INTRAVENOUS | Status: DC
  Administered 2016-11-26: 30 ug/min via INTRAVENOUS
  Administered 2016-11-26 – 2016-11-27 (×2): 20 ug/min via INTRAVENOUS
  Filled 2016-11-26 (×3): qty 1

## 2016-11-26 NOTE — Evaluation (Signed)
Clinical/Bedside Swallow Evaluation Patient Details  Name: Bruce [name] MRN: [id number] Date of Birth: [birth year]  Today's Date: 11/26/2016 Time: SLP Start Time (ACUTE ONLY): 52 SLP Stop Time (ACUTE ONLY): 1130 SLP Time Calculation (min) (ACUTE ONLY): 25 min  Past Medical History:  Past Medical History:  Diagnosis Date  . Cancer ([place]) 05/28/12   bx=esophagus=squamous cell carcinoma  . Dysphasia    solid and liquid  . Esophagitis   . Lung cancer ([place]) 12/27/13   right upper lung  . Mass of esophagus 05/28/2012   BX'D DISTAL ESOPHAGUS,PENDING  . Neuropathy ([place])    right hand numbness 1 year 2012   Past Surgical History:  Past Surgical History:  Procedure Laterality Date  . COMPLETE ESOPHAGECTOMY  08/26/2012   Procedure: ESOPHAGECTOMY COMPLETE;  Surgeon: [name], MD;  Location: [facility] OR;  Service: Thoracic;  Laterality: N/A;  transhiatal   . 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 EUS  06/11/2012   Procedure: UPPER ENDOSCOPIC ULTRASOUND (EUS) RADIAL;  Surgeon: Milus Banister, MD;  Location: [facility];  Service: Endoscopy;  Laterality: N/A;  . JEJUNOSTOMY  08/26/2012   Procedure: JEJUNOSTOMY;  Surgeon: [name], MD;  Location: [place];  Service: Thoracic;  Laterality: N/A;  placement of feeding jujunostomy tube  . LYMPH NODE DISSECTION Right 01/31/2014   Procedure: LYMPH NODE DISSECTION;  Surgeon: [name], MD;  Location: [place];  Service: Thoracic;  Laterality: Right;  . PYLOROPLASTY  08/26/2012   Procedure: PYLOROPLASTY;  Surgeon: [name], MD;  Location: [place];  Service: Thoracic;  Laterality: N/A;  . VIDEO ASSISTED THORACOSCOPY (VATS)/WEDGE RESECTION Right 01/31/2014   Procedure: VIDEO ASSISTED THORACOSCOPY (VATS)/WEDGE RESECTION; with insertion of On Q pain pump;  Surgeon: [name], MD;  Location: [place];  Service: Thoracic;  Laterality: Right;  with  insertion of On Q pain pump  . VIDEO BRONCHOSCOPY  08/26/2012   Procedure: VIDEO BRONCHOSCOPY;  Surgeon: [name], MD;  Location: [place];  Service: Thoracic;  Laterality: N/A;  . VIDEO BRONCHOSCOPY N/A 01/31/2014   Procedure: VIDEO BRONCHOSCOPY;  Surgeon: [name], MD;  Location: [facility] OR;  Service: Thoracic;  Laterality: N/A;   HPI:  56 year old non-English speaking male admitted 12/14/2016 due to coughing, choking and difficulty breathing.  PMH significant for metastatic esophageal CA (dx 2013), SVC syndrome, tracheal compression, lung mets, esophagectomy (2014). Barium Swallow was completed 02/2016, which revealed flash penetration, no hypopharyngeal stricture, no aspiration.    Assessment / Plan / Recommendation Clinical Impression  Pt presents with multiple risk factors for aspiration: hx esophagectomy; lung mets with tracheal compression; diffuse edema in oral cavity, with questionable extension to pharynx - this could impact PO transition and airway closure.  There was penetration of larynx noted on last barium swallow April 2017, which could predispose pt to aspiration.  Clinical s/s are complaints of residue in throat, multiple subswallows per bolus, intermittent mild-throat clearing after consumption of thin liquids, poor secretion management per RN.  Discussed with pt and family the option of MBS, which may confirm aspiration but may not change treatment plan, vs continuing to take POs despite known risks.  After discussion with family translating, the pt decided he would like to proceed with the MBS.   Will schedule for next date.     Aspiration Risk  Moderate aspiration risk    Diet Recommendation   continue  clear liquids per pt's preferences  Medication Administration: Whole meds with liquid    Other  Recommendations Oral Care Recommendations: Oral care QID   Follow up Recommendations  (tba)      Frequency and Duration            Prognosis        Swallow Study    General Date of Onset: 11/28/2016 HPI: 56 year old non-English speaking male admitted 11/29/2016 due to coughing, choking and difficulty breathing.  PMH significant for metastatic esophageal CA (dx 2013), SVC syndrome, tracheal compression, lung mets, esophagectomy (2014). Barium Swallow was completed 02/2016, which revealed flash penetration, no hypopharyngeal stricture, no aspiration.  Type of Study: Bedside Swallow Evaluation Previous Swallow Assessment: barium swallow April 2017 Diet Prior to this Study: Thin liquids Temperature Spikes Noted: No Respiratory Status: Nasal cannula History of Recent Intubation: No Behavior/Cognition: Alert;Cooperative Oral Cavity Assessment: Edema (right>left buccal tissue) Oral Care Completed by SLP: No Oral Cavity - Dentition: Adequate natural dentition Vision: Functional for self-feeding Self-Feeding Abilities: Able to feed self Patient Positioning: Upright in bed Baseline Vocal Quality: Normal Volitional Cough: Strong Volitional Swallow: Able to elicit    Oral/Motor/Sensory Function     Ice Chips Ice chips: Not tested   Thin Liquid Thin Liquid: Impaired Presentation: Cup;Self Fed Pharyngeal  Phase Impairments: Throat Clearing - Delayed;Multiple swallows    Nectar Thick Nectar Thick Liquid: Not tested   Honey Thick Honey Thick Liquid: Not tested   Puree Puree: Not tested   Solid   GO   Solid: Not tested        Juan Quam Laurice 11/26/2016,11:39 AM   Estill Bamberg L. Tivis Ringer, [place] CCC/SLP Pager [phone]

## 2016-11-26 NOTE — Progress Notes (Signed)
[place] Progress Note Patient Name: Bruce [name] DOB: 11-21-1960 MRN: [id number]   Date of Service  11/26/2016  HPI/Events of Note  Notified by bedside nurse of worsening hypotension. On camera check patient is awake and daughter is at bedside. Blood pressure 60/41 with heart rate 114. Saturating questionably due to poor waveform on 5 L/m via nasal cannula with respiratory rate in the mid 20s. Discussed peripheral vasopressor support and risk of limb ischemia as well as potential need for central venous catheter placement. Given tenuous respiratory status IV fluid bolus would not be recommended. Given SVC syndrome/narrowing PICC versus internal jugular central venous catheter would be contraindicated. Explained we would have to place a femoral central venous catheter which is suboptimal. Questions answered and daughter spoke with father who does not speak English reporting he wants everything possible done at this time including catheter placement if necessary.   eICU Interventions  1. Initiated peripheral Neo-Synephrine for blood pressure support with maximum flow rate 100 2. Plan for femoral central venous catheter placement if low-dose peripheral vasopressor is unsuccessful      Intervention Category Major Interventions: Hypotension - evaluation and management;Respiratory failure - evaluation and management  Tera Partridge 11/26/2016, 12:33 AM

## 2016-11-26 NOTE — Progress Notes (Signed)
Initial Nutrition Assessment  DOCUMENTATION CODES:   Underweight  INTERVENTION:  - Diet advancement as medically feasible/tolerated.  - Will order Boost Breeze TID, each supplement provides 250 kcal and 9 grams of protein - Will order daily multivitamin with minerals. - RD will follow-up 1/11.  NUTRITION DIAGNOSIS:   Increased nutrient needs related to catabolic illness, cancer and cancer related treatments as evidenced by estimated needs.  GOAL:   Patient will meet greater than or equal to 90% of their needs  MONITOR:   PO intake, Supplement acceptance, Diet advancement, Weight trends, Labs, I & O's  REASON FOR ASSESSMENT:   Other (Comment) (underweight BMI)  ASSESSMENT:   56 y.o. male with a past medical history significant for metastatic esophageal cancer c/b loculated R pleural effusion, SVC syndrome and tracheal compression on palliative chemorad who presents with sudden coughing.  Pt seen d/t underweight BMI. Pt has been on CLD since 1/7 at 1300; will order Boost Breeze d/t this order. Ensure Jeanne Ivan is already ordered but would not be appropriate for CLD. Pt with esophageal cancer and was started on palliative radiation 1/2 and palliative chemo 1/5. Unable to speak with pt or family d/t their prolonged discussion with other care providers; will follow-up Thursday to obtain information and assess for further needs.  Unable to complete physical assessment at this time. Per chart review, pt has lost 6 lbs (6% body weight) in the past 2.5 months which is not significant for time frame. Pt likely meets criteria for malnutrition but unable to confirm without physical assessment and talking with pt/family.  Medications reviewed; PRN oral Compazine.  Labs reviewed; Na: 126 mmol/L, Cl: 90 mmol/L, creatinine: 0.51 mg/dL.  IVF: NS @ 75 mL/hr.     Diet Order:  Diet clear liquid Room service appropriate? Yes; Fluid consistency: Thin  Skin:  Reviewed, no issues  Last BM:   PTA/unknown  Height:   Ht Readings from Last 1 Encounters:  11/24/16 '5\' 3"'$  (1.6 m)    Weight:   Wt Readings from Last 1 Encounters:  11/24/16 95 lb 7.4 oz (43.3 kg)    Ideal Body Weight:  56.36 kg  BMI:  Body mass index is 16.91 kg/m.  Estimated Nutritional Needs:   Kcal:  1515-1730 (35-40 kcal/kg)  Protein:  61-74 grams (1.4-1.7 grams/kg)  Fluid:  >/= 1.8 L/day  EDUCATION NEEDS:   No education needs identified at this time    Jarome Matin, MS, RD, LDN, CNSC Inpatient Clinical Dietitian Pager # [phone] After hours/weekend pager # [phone]

## 2016-11-26 NOTE — Progress Notes (Signed)
PROGRESS NOTE    Bruce [name]  NTI:[id number] DOB: 02-25-1961 DOA: 12/16/2016 PCP: No PCP Per Patient    Brief Narrative:  56 y.o. male with a past medical history significant for metastatic esophageal cancer c/b loculated R pleural effusion, SVC syndrome and tracheal compression on palliative chemorad who presents with sudden coughing.  Caveat that all history is collected through daughter interpreting, as patient does not speak [place] and declines telephonic interpreter to me tonight.  The patient had his routine irinotecan/cisplatin infusion Friday, and was recuperating today as usual the day after, when tonight he was at home with his wife and daughter when he started coughing, tried to cough up some phlegm but it seemed to choke him, and he had "a stroke" where his eyes rolled back and he seemed to be unable to breathe, so his wife called 9-1-1.  EMS found him SpO2 99% on room air, struggling to breathe, HR 150 and BP 140/94 and brought him in, gave Solu-medrol, Duo-neb en route.  Patient endorses wheezing, also recurrent chills this week, which he and family attribute to typical after chemo  During this hospital admission, Pulmonary was consulted with recommendations for US guided paracentesis. Patient underwent paracentesis on 1/8, yielding over 900cc of fluid. Overnight patient noted to become hypotensive, requiring pressor support.  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 Sepsis, suspect from pneumonia:  -Suspected source is possible pneumonia. -Patient meets criteria given tachycardia, tachypnea, leukocytosis, and evidence of organ dysfunction -flu negative. Have d/c'd Tamiflu -Patient  is noted to be rhinovirus positive -Patient is continued on azithro and rocephin -Overnight, patient became hypotensive, now pressor dependent. PCCM following  2. Hyponatremia:  -currently 126, baseline Na of  ~130. -Sodium remains low, but stable -continue to follow BMET  3. Metastatic esophageal cancer:  -Continued morphine for pain, dyspnea -Oncology consulted, appreciate input -CT suggests possible new SVC clot, will defer anticoagulation until after goals of care discussion with Oncology  -Pt now DNR per Oncology. Recs for continued palliative radiation  4. Normocytic anemia:  -Suspect from chronic disease/cancer -Remains stable -Repeat CBC in AM  5. Bilateral Large Pleural Effusion -Patient had failed attempt at thoracentesis in 6/17 -Pulm was consulted and patient is now s/p thoracentesis 1/8, yielding just under 1L of fluid . Patient remains tachycardic and with tachypnea -Hypotensive overnight, now on pressors   DVT prophylaxis: Lovenox Code Status: Full Family Communication: Pt in room, family at bedside Disposition Plan: Uncertain at this time  Consultants:   Oncology  Critical Care  Procedures:   US guided thoracentesis 11/25/16  Antimicrobials: Anti-infectives    Start     Dose/Rate Route Frequency Ordered Stop   11/25/16 0600  vancomycin (VANCOCIN) IVPB 1000 mg/200 mL premix  Status:  Discontinued     1,000 mg 200 mL/hr over 60 Minutes Intravenous Every 24 hours 11/24/16 0300 11/24/16 0521   11/24/16 1000  piperacillin-tazobactam (ZOSYN) IVPB 3.375 g  Status:  Discontinued     3.375 g 12.5 mL/hr over 240 Minutes Intravenous Every 8 hours 11/24/16 0225 11/24/16 0521   11/24/16 1000  oseltamivir (TAMIFLU) capsule 75 mg  Status:  Discontinued     75 mg Oral 2 times  daily 11/24/16 0449 11/24/16 1635   11/24/16 0800  cefTRIAXone (ROCEPHIN) 1 g in dextrose 5 % 50 mL IVPB     1 g 100 mL/hr over 30 Minutes Intravenous Every 24 hours 11/24/16 0514      11/24/16 0600  azithromycin (ZITHROMAX) 500 mg in dextrose 5 % 250 mL IVPB     500 mg 250 mL/hr over 60 Minutes Intravenous Every 24 hours 11/24/16 0513     11/24/16 0200  piperacillin-tazobactam (ZOSYN) IVPB 3.375 g     3.375 g 100 mL/hr over 30 Minutes Intravenous  Once 11/24/16 0148 11/24/16 0250   11/24/16 0200  vancomycin (VANCOCIN) IVPB 1000 mg/200 mL premix     1,000 mg 200 mL/hr over 60 Minutes Intravenous  Once 11/24/16 0148 11/24/16 0401      Subjective: Pt still reports sob  Objective: Vitals:   11/26/16 1015 11/26/16 1030 11/26/16 1045 11/26/16 1100  BP: 107/70 100/70 103/70 (!) 89/66  Pulse: (!) 121 (!) 121 (!) 118 (!) 118  Resp: (!) 23 (!) 24 15 (!) 25  Temp:      TempSrc:      SpO2: 100% 100% 100% 100%  Weight:      Height:        Intake/Output Summary (Last 24 hours) at 11/26/16 1127 Last data filed at 11/26/16 1100  Gross per 24 hour  Intake          2221.88 ml  Output             1175 ml  Net          1046.88 ml   Filed Weights   11/24/16 0500  Weight: 43.3 kg (95 lb 7.4 oz)    Examination:  General exam: Awake, laying in bed, in nad Respiratory system: no audible wheezing, clear, increased resp effort Cardiovascular system: regular rate, s1, s2 Gastrointestinal system: nondistended, no masses, pos BS Central nervous system: no seizures, no tremors Extremities: perfused, no joint deformities Skin: no notable skin lesions seen, normal skin turgor Psychiatry: affect normal// no auditory hallucinations.   Data Reviewed: I have personally reviewed following labs and imaging studies  CBC:  Recent Labs Lab 11/24/16 0110 11/24/16 0620 11/25/16 0342 11/26/16 0412  WBC 14.3* 10.1 13.8* 12.3*  NEUTROABS 13.9*  --   --   --   HGB 10.6* 9.8* 9.3* 9.8*  HCT 31.3* 28.0* 27.7* 29.1*  MCV 80.3 79.3 80.5 81.5  PLT 262 240 244 784   Basic Metabolic Panel:  Recent Labs Lab 11/24/16 0110 11/24/16 0620 11/25/16 0342 11/26/16 0412  NA 126* 127*  126* 126*  K 4.2 3.8 4.1 3.7  CL 92* 93* 93* 90*  CO2 '22 25 26 30  '$ GLUCOSE 200* 156* 99 100*  BUN 20 19 21* 18  CREATININE 0.64 0.60* 0.55* 0.51*  CALCIUM 9.2 9.0 9.2 8.9   GFR: Estimated Creatinine Clearance: 64.6 mL/min (by C-G formula based on SCr of 0.51 mg/dL (L)). Liver Function Tests: No results for input(s): AST, ALT, ALKPHOS, BILITOT, PROT, ALBUMIN in the last 168 hours. No results for input(s): LIPASE, AMYLASE in the last 168 hours. No results for input(s): AMMONIA in the last 168 hours. Coagulation Profile: No results for input(s): INR, PROTIME in the last 168 hours. Cardiac Enzymes: No results for input(s): CKTOTAL, CKMB, CKMBINDEX, TROPONINI in the last 168 hours. BNP (last 3 results) No results for input(s): PROBNP in the last 8760 hours. HbA1C: No results for input(s): HGBA1C in the last 72 hours.  CBG: No results for input(s): GLUCAP in the last 168 hours. Lipid Profile: No results for input(s): CHOL, HDL, LDLCALC, TRIG, CHOLHDL, LDLDIRECT in the last 72 hours. Thyroid Function Tests: No results for input(s): TSH, T4TOTAL, FREET4, T3FREE, THYROIDAB in the last 72 hours. Anemia Panel: No results for input(s): VITAMINB12, FOLATE, FERRITIN, TIBC, IRON, RETICCTPCT in the last 72 hours. Sepsis Labs:  Recent Labs Lab 11/24/16 0202 11/24/16 0620 11/24/16 0911 11/26/16 0412  PROCALCITON  --  <0.10  --  0.11  LATICACIDVEN 6.75* 3.5* 1.9  --     Recent Results (from the past 240 hour(s))  Respiratory Panel by PCR     Status: Abnormal   Collection Time: 11/24/16  1:30 AM  Result Value Ref Range Status   Adenovirus NOT DETECTED NOT DETECTED Final   Coronavirus 229E NOT DETECTED NOT DETECTED Final   Coronavirus HKU1 NOT DETECTED NOT DETECTED Final   Coronavirus NL63 NOT DETECTED NOT DETECTED Final   Coronavirus OC43 NOT DETECTED NOT DETECTED Final   Metapneumovirus NOT DETECTED NOT DETECTED Final   Rhinovirus / Enterovirus DETECTED (A) NOT DETECTED Final    Influenza A NOT DETECTED NOT DETECTED Final   Influenza B NOT DETECTED NOT DETECTED Final   Parainfluenza Virus 1 NOT DETECTED NOT DETECTED Final   Parainfluenza Virus 2 NOT DETECTED NOT DETECTED Final   Parainfluenza Virus 3 NOT DETECTED NOT DETECTED Final   Parainfluenza Virus 4 NOT DETECTED NOT DETECTED Final   Respiratory Syncytial Virus NOT DETECTED NOT DETECTED Final   Bordetella pertussis NOT DETECTED NOT DETECTED Final   Chlamydophila pneumoniae NOT DETECTED NOT DETECTED Final   Mycoplasma pneumoniae NOT DETECTED NOT DETECTED Final    Comment: Performed at [facility]  Blood Culture (routine x 2)     Status: None (Preliminary result)   Collection Time: 11/24/16  2:10 AM  Result Value Ref Range Status   Specimen Description BLOOD LEFT HAND  Final   Special Requests BOTTLES DRAWN AEROBIC AND ANAEROBIC 5ML  Final   Culture   Final    NO GROWTH 1 DAY Performed at [facility]    Report Status PENDING  Incomplete  Blood Culture (routine x 2)     Status: None (Preliminary result)   Collection Time: 11/24/16  2:12 AM  Result Value Ref Range Status   Specimen Description BLOOD LEFT HAND  Final   Special Requests BOTTLES DRAWN AEROBIC AND ANAEROBIC 5ML  Final   Culture   Final    NO GROWTH 1 DAY Performed at [facility]    Report Status PENDING  Incomplete  Urine culture     Status: None   Collection Time: 11/24/16  3:33 AM  Result Value Ref Range Status   Specimen Description URINE, CLEAN CATCH  Final   Special Requests NONE  Final   Culture NO GROWTH Performed at [facility]   Final   Report Status 11/25/2016 FINAL  Final  MRSA PCR Screening     Status: None   Collection Time: 11/24/16  5:41 AM  Result Value Ref Range Status   MRSA by PCR NEGATIVE NEGATIVE Final    Comment:        The GeneXpert MRSA Assay (FDA approved for NASAL specimens only), is one component of a comprehensive MRSA colonization surveillance program. It is  not intended to diagnose MRSA infection nor to guide or monitor treatment for MRSA infections.   Gram stain     Status: None   Collection  Time: 11/25/16 10:27 AM  Result Value Ref Range Status   Specimen Description PLEURAL  Final   Special Requests NONE  Final   Gram Stain   Final    FEW WBC PRESENT, PREDOMINANTLY MONONUCLEAR NO ORGANISMS SEEN Performed at [facility]    Report Status 11/25/2016 FINAL  Final     Radiology Studies: Dg Chest 1 View  Result Date: 11/25/2016 CLINICAL DATA:  Status post thoracentesis EXAM: CHEST 1 VIEW COMPARISON:  12/15/2016 FINDINGS: Mild cardiac enlargement. There is a moderate right pleural effusion with diminished aeration to the right lung. The appearance is similar to previous exam. Left pleural effusion has resolved in the interval. IMPRESSION: 1. Persistent moderate right pleural effusion. 2. Decrease in left effusion. Electronically Signed   By: Kerby Moors M.D.   On: 11/25/2016 10:43   US Thoracentesis Asp Pleural Space W/img Guide  Result Date: 11/25/2016 INDICATION: Patient with history of metastatic esophageal cancer, bilateral pleural effusions left greater than right. Request made for diagnostic and therapeutic left thoracentesis. EXAM: ULTRASOUND GUIDED DIAGNOSTIC AND THERAPEUTIC LEFT THORACENTESIS MEDICATIONS: None. COMPLICATIONS: None immediate. PROCEDURE: An ultrasound guided thoracentesis was thoroughly discussed with the patient and questions answered. The benefits, risks, alternatives and complications were also discussed. The patient understands and wishes to proceed with the procedure. Written consent was obtained. Ultrasound was performed to localize and mark an adequate pocket of fluid in the left chest. The area was then prepped and draped in the normal sterile fashion. 1% Lidocaine was used for local anesthesia. Under ultrasound guidance a Safe-T-Centesis catheter was introduced. Thoracentesis was performed. The catheter  was removed and a dressing applied. FINDINGS: A total of approximately 910 cc of yellow fluid was removed. Samples were sent to the laboratory as requested by the clinical team. Patient's right pleural effusion was extensively multiloculated, therefore decision made to tap larger left effusion at this time. IMPRESSION: Successful ultrasound guided diagnostic and therapeutic left thoracentesis yielding 910 cc of pleural fluid. Read by: Rowe Robert, PA-C Electronically Signed   By: Sandi Mariscal M.D.   On: 11/25/2016 11:39    Scheduled Meds: . azithromycin  500 mg Intravenous Q24H  . cefTRIAXone (ROCEPHIN)  IV  1 g Intravenous Q24H  . chlorhexidine  15 mL Mouth Rinse BID  . enoxaparin (LOVENOX) injection  30 mg Subcutaneous Daily  . feeding supplement (ENSURE ENLIVE)  237 mL Oral BID BM  . guaiFENesin  600 mg Oral BID  . mouth rinse  15 mL Mouth Rinse q12n4p  . sodium chloride flush  3 mL Intravenous Q12H   Continuous Infusions: . sodium chloride 75 mL/hr at 11/26/16 0622  . phenylephrine (NEO-SYNEPHRINE) Adult infusion 13 mcg/min (11/26/16 1003)     LOS: 2 days   Velera Lansdale, Orpah Melter, MD [facility] Pager [phone]  If 7PM-7AM, please contact night-coverage [email or web address] Password [email or web address] 11/26/2016, 11:27 AM

## 2016-11-26 NOTE — Progress Notes (Signed)
PCCM Progress Note  Admission date: 12/12/2016 Consult date: 11/25/2016 Referring provider: Dr. Wyline Copas, [facility]  CC: Short of breath  HPI: 56 yo male presented with progressive dyspnea, body aches, cough, wheezing.  He has hx of esophageal cancer with loculated Rt pleural effusion, SVC syndrome, tracheal compression on palliative chemotherapy as outpt.  He was admitted for sepsis from HCAP.  He was also noted to have b/l pleural effusions.  Subjective: Pt's family assisted with translation.  He still has cough.  Denies chest pain, abdominal pain, nausea, headache.  Notes swelling in face and arms.  Vital signs: BP (!) 89/66   Pulse (!) 118   Temp 97.2 F (36.2 C) (Axillary)   Resp (!) 25   Ht '5\' 3"'$  (1.6 m)   Wt 95 lb 7.4 oz (43.3 kg)   SpO2 100%   BMI 16.91 kg/m   Intake/output: I/O last 3 completed shifts: In: 2987.4 [I.V.:2687.4; IV Piggyback:300] Out: 1325 [Urine:1325]  General: alert Neuro: normal strength, follows commands HEENT: facial edema, no stridor Cardiac: regular, tachycardic Chest: severely diminished BS on Rt, basilar crackles on Lt Abd: soft, non tender Ext: decreased muscle bulk in legs Skin: no rashes   CMP Latest Ref Rng & Units 11/26/2016 11/25/2016 11/24/2016  Glucose 65 - 99 mg/dL 100(H) 99 156(H)  BUN 6 - 20 mg/dL 18 21(H) 19  Creatinine 0.61 - 1.24 mg/dL 0.51(L) 0.55(L) 0.60(L)  Sodium 135 - 145 mmol/L 126(L) 126(L) 127(L)  Potassium 3.5 - 5.1 mmol/L 3.7 4.1 3.8  Chloride 101 - 111 mmol/L 90(L) 93(L) 93(L)  CO2 22 - 32 mmol/L '30 26 25  '$ Calcium 8.9 - 10.3 mg/dL 8.9 9.2 9.0  Total Protein 6.4 - 8.3 g/dL - - -  Total Bilirubin 0.20 - 1.20 mg/dL - - -  Alkaline Phos 40 - 150 U/L - - -  AST 5 - 34 U/L - - -  ALT 0 - 55 U/L - - -     CBC Latest Ref Rng & Units 11/26/2016 11/25/2016 11/24/2016  WBC 4.0 - 10.5 K/uL 12.3(H) 13.8(H) 10.1  Hemoglobin 13.0 - 17.0 g/dL 9.8(L) 9.3(L) 9.8(L)  Hematocrit 39.0 - 52.0 % 29.1(L) 27.7(L) 28.0(L)  Platelets 150 - 400 K/uL  271 244 240     ABG    Component Value Date/Time   PHART 7.437 11/24/2016 0110   PCO2ART 33.6 11/24/2016 0110   PO2ART 90.7 11/24/2016 0110   HCO3 22.3 11/24/2016 0110   TCO2 27 02/01/2014 0444   ACIDBASEDEF 1.0 11/24/2016 0110   O2SAT 96.4 11/24/2016 0110     CBG (last 3)  No results for input(s): GLUCAP in the last 72 hours.   Imaging: Dg Chest 1 View  Result Date: 11/25/2016 CLINICAL DATA:  Status post thoracentesis EXAM: CHEST 1 VIEW COMPARISON:  12/09/2016 FINDINGS: Mild cardiac enlargement. There is a moderate right pleural effusion with diminished aeration to the right lung. The appearance is similar to previous exam. Left pleural effusion has resolved in the interval. IMPRESSION: 1. Persistent moderate right pleural effusion. 2. Decrease in left effusion. Electronically Signed   By: Kerby Moors M.D.   On: 11/25/2016 10:43   US Thoracentesis Asp Pleural Space W/img Guide  Result Date: 11/25/2016 INDICATION: Patient with history of metastatic esophageal cancer, bilateral pleural effusions left greater than right. Request made for diagnostic and therapeutic left thoracentesis. EXAM: ULTRASOUND GUIDED DIAGNOSTIC AND THERAPEUTIC LEFT THORACENTESIS MEDICATIONS: None. COMPLICATIONS: None immediate. PROCEDURE: An ultrasound guided thoracentesis was thoroughly discussed with the patient and questions  answered. The benefits, risks, alternatives and complications were also discussed. The patient understands and wishes to proceed with the procedure. Written consent was obtained. Ultrasound was performed to localize and mark an adequate pocket of fluid in the left chest. The area was then prepped and draped in the normal sterile fashion. 1% Lidocaine was used for local anesthesia. Under ultrasound guidance a Safe-T-Centesis catheter was introduced. Thoracentesis was performed. The catheter was removed and a dressing applied. FINDINGS: A total of approximately 910 cc of yellow fluid was  removed. Samples were sent to the laboratory as requested by the clinical team. Patient's right pleural effusion was extensively multiloculated, therefore decision made to tap larger left effusion at this time. IMPRESSION: Successful ultrasound guided diagnostic and therapeutic left thoracentesis yielding 910 cc of pleural fluid. Read by: Rowe Robert, PA-C Electronically Signed   By: Sandi Mariscal M.D.   On: 11/25/2016 11:39     Studies: CT angio chest 1/07 >> mediastinal mass with narrowing of Rt PA and SVC, large B/L effusions increased on Lt and loculated on Rt, RLL consolidation, multiple nodules Lt thoracentesis 1/08 >> 910 ml yellow fluid, glucose 111, protein 3.4, LDH 195, WBC 915 (81%L)  Antibiotics: Vancomycin 1/06 >> 1/06 Zosyn 1/06 >> 1/06 Rocephine 1/07 >> Zithromax 1/07 >>  Cultures: Respiratory viral panel 1/07 >> rhinovirus Urine 1/07 >> negative Blood 1/07 >>  Lt pleural fluid 1/08 >>   Lines/tubes:  Events: 1/06 Admit 1/08 Oncology consulted 1/09 Started on pressors, DNR established  Summary: 56 yo male with 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  Had progressive hypotension from sepsis and started on pressors 11/26/16.  Assessment/plan:  Sepsis from pneumonia. - pressors to keep MAP > 60, SBP > 85 - check cortisol and add solu corfef  Pneumonia with rhinovirus positive. 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 - XRT per radiation oncology - pt/family would like to continue palliative chemotherapy if possible  B/l pleural effusions >> loculated on Rt. - f/u Lt pleural fluid cx and cytology from thoracentesis 1/08  Acute hypoxic respiratory failure. - oxygen to keep SpO2 > 92%  Possible aspiration. - f/u with speech therapy when able  Goals of care. - DNR/DNI - if he  doesn't improve and can't get to point to resume chemotherapy, then hospice/comfort care might be more appropriate  CC time 42 minutes  Chesley Mires, MD [place] 11/26/2016, 12:03 PM Pager:  [phone] After 3pm call: [phone]

## 2016-11-26 NOTE — Progress Notes (Signed)
IP PROGRESS NOTE  Subjective:   Bruce [name] reports feeling better for a few hours after the thoracentesis yesterday. He has difficulty clearing sputum from his throat. His daughter is at the bedside. He was started on Neo-Synephrine this morning.  Objective: Vital signs in last 24 hours: Blood pressure 97/74, pulse (!) 122, temperature 97.4 F (36.3 C), temperature source Axillary, resp. rate (!) 39, height '5\' 3"'$  (1.6 m), weight 95 lb 7.4 oz (43.3 kg), SpO2 100 %.  Intake/Output from previous day: 01/08 0701 - 01/09 0700 In: 1987.4 [I.V.:1937.4; IV Piggyback:50] Out: 3716 [Urine:1175]  Physical Exam:  HEENT:  the face and neck are swollen, no thrush, firm nodal masses in the right greater than left lower neck Lungs:  decreased breath sounds over the right chest, increased respiratory rate Cardiac:  regular rate and rhythm Abdomen:  no hepatomegaly, nontender Extremities:  no leg edema, 1+ edema in both arms   Lab Results:  Recent Labs  11/25/16 0342 11/26/16 0412  WBC 13.8* 12.3*  HGB 9.3* 9.8*  HCT 27.7* 29.1*  PLT 244 271    BMET  Recent Labs  11/25/16 0342 11/26/16 0412  NA 126* 126*  K 4.1 3.7  CL 93* 90*  CO2 26 30  GLUCOSE 99 100*  BUN 21* 18  CREATININE 0.55* 0.51*  CALCIUM 9.2 8.9    Studies/Results: Dg Chest 1 View  Result Date: 11/25/2016 CLINICAL DATA:  Status post thoracentesis EXAM: CHEST 1 VIEW COMPARISON:  12/15/2016 FINDINGS: Mild cardiac enlargement. There is a moderate right pleural effusion with diminished aeration to the right lung. The appearance is similar to previous exam. Left pleural effusion has resolved in the interval. IMPRESSION: 1. Persistent moderate right pleural effusion. 2. Decrease in left effusion. Electronically Signed   By: Kerby Moors M.D.   On: 11/25/2016 10:43   US Thoracentesis Asp Pleural Space W/img Guide  Result Date: 11/25/2016 INDICATION: Patient with history of metastatic esophageal cancer, bilateral pleural  effusions left greater than right. Request made for diagnostic and therapeutic left thoracentesis. EXAM: ULTRASOUND GUIDED DIAGNOSTIC AND THERAPEUTIC LEFT THORACENTESIS MEDICATIONS: None. COMPLICATIONS: None immediate. PROCEDURE: An ultrasound guided thoracentesis was thoroughly discussed with the patient and questions answered. The benefits, risks, alternatives and complications were also discussed. The patient understands and wishes to proceed with the procedure. Written consent was obtained. Ultrasound was performed to localize and mark an adequate pocket of fluid in the left chest. The area was then prepped and draped in the normal sterile fashion. 1% Lidocaine was used for local anesthesia. Under ultrasound guidance a Safe-T-Centesis catheter was introduced. Thoracentesis was performed. The catheter was removed and a dressing applied. FINDINGS: A total of approximately 910 cc of yellow fluid was removed. Samples were sent to the laboratory as requested by the clinical team. Patient's right pleural effusion was extensively multiloculated, therefore decision made to tap larger left effusion at this time. IMPRESSION: Successful ultrasound guided diagnostic and therapeutic left thoracentesis yielding 910 cc of pleural fluid. Read by: Rowe Robert, PA-C Electronically Signed   By: Sandi Mariscal M.D.   On: 11/25/2016 11:39    Medications: I have reviewed the patient's current medications.  Assessment/Plan:  1.metastatic esophagus cancer-progressive disease in the chest confirmed on CT 11/06/2016    palliative chest radiation and initiated 11/19/2016  [place] weekly irinotecan/cisplatin chemotherapy beginning 11/22/00/2018  2. Admission 11/19/2016 with respiratory failure-likely secondary to tumor progression in the lungs and increased effusions  Palliative left thoracentesis 11/25/2016 3. Chest/back pain secondary to #1 4.  Anemia secondary to chronic disease 5. SVC syndrome secondary to #1 6.  Hypotension-likely secondary to #5, management per CCM   Bruce [name] appears unchanged. He has a persistent productive cough. He has hypotension. I discussed the situation with Bruce [name] and his daughter. He would like to continue radiation and chemotherapy for now. He will agree to a home Hospice referral if we determine the tumor is progressing in the face of current therapy.  I discussed CPR and ACLS issues with him via his daughter. He agrees to a no CODE BLUE status.  Recommendations: 1. Wean phenylephrine as tolerated 2. Continue oxygen and morphine for dyspnea 3. Continue palliative radiation to the chest 4. No CODE BLUE 5. Trial of Mucinex   LOS: 2 days   Betsy Coder, MD   11/26/2016, 8:42 AM

## 2016-11-27 ENCOUNTER — Inpatient Hospital Stay (HOSPITAL_COMMUNITY): Payer: BLUE CROSS/BLUE SHIELD

## 2016-11-27 ENCOUNTER — Ambulatory Visit
Admission: RE | Admit: 2016-11-27 | Discharge: 2016-11-27 | Disposition: A | Discharge status: Home / Self Care | Payer: BLUE CROSS/BLUE SHIELD | Source: Ambulatory Visit | Attending: Radiation Oncology | Admitting: Radiation Oncology

## 2016-11-27 DIAGNOSIS — J9601 Acute respiratory failure with hypoxia: Secondary | ICD-10-CM

## 2016-11-27 LAB — COMPREHENSIVE METABOLIC PANEL
ALK PHOS: 166 U/L — AB (ref 38–126)
ALT: 56 U/L (ref 17–63)
ANION GAP: 6 (ref 5–15)
AST: 39 U/L (ref 15–41)
Albumin: 2.7 g/dL — ABNORMAL LOW (ref 3.5–5.0)
BILIRUBIN TOTAL: 0.6 mg/dL (ref 0.3–1.2)
BUN: 15 mg/dL (ref 6–20)
CALCIUM: 8.9 mg/dL (ref 8.9–10.3)
CO2: 29 mmol/L (ref 22–32)
CREATININE: 0.55 mg/dL — AB (ref 0.61–1.24)
Chloride: 93 mmol/L — ABNORMAL LOW (ref 101–111)
Glucose, Bld: 116 mg/dL — ABNORMAL HIGH (ref 65–99)
Potassium: 4 mmol/L (ref 3.5–5.1)
SODIUM: 128 mmol/L — AB (ref 135–145)
TOTAL PROTEIN: 6.4 g/dL — AB (ref 6.5–8.1)

## 2016-11-27 LAB — CBC
HCT: 26.5 % — ABNORMAL LOW (ref 39.0–52.0)
HEMOGLOBIN: 8.9 g/dL — AB (ref 13.0–17.0)
MCH: 27.1 pg (ref 26.0–34.0)
MCHC: 33.6 g/dL (ref 30.0–36.0)
MCV: 80.8 fL (ref 78.0–100.0)
PLATELETS: 218 10*3/uL (ref 150–400)
RBC: 3.28 MIL/uL — AB (ref 4.22–5.81)
RDW: 13.8 % (ref 11.5–15.5)
WBC: 9.5 10*3/uL (ref 4.0–10.5)

## 2016-11-27 MED ORDER — METOPROLOL TARTRATE 5 MG/5ML IV SOLN
2.5000 mg | INTRAVENOUS | Status: DC | PRN
  Administered 2016-11-27: 2.5 mg via INTRAVENOUS
  Filled 2016-11-27: qty 5

## 2016-11-27 MED ORDER — RESOURCE THICKENUP CLEAR PO POWD
ORAL | Status: DC | PRN
  Administered 2016-11-27: 16:00:00 via ORAL
  Filled 2016-11-27: qty 125

## 2016-11-27 NOTE — Consult Note (Signed)
Modified Barium Swallow Progress Note  Patient Details  Name: Bruce [name] MRN: [id number] Date of Birth: 06/03/1961  Today's Date: 11/27/2016  Modified Barium Swallow completed.  Full report located under Chart Review in the Imaging Section.  Brief recommendations include the following:  Clinical Impression  Pt presents with mild oral, severe pharyngeal dysphagia, characterized orally by piecemeal deglutition of honey and puree consistencies. Pharyngeally, pt exhibits delayed swallow reflex with trigger at the level of the vallecular sinus across consistencies. Additionally, trace post-swallow vallecular residue is seen across consistencies, which pt is able to clear with cued dry swallow. Of primary concern is pt's gross aspiration of thin and nectar thick liquids with no cough response (SILENT ASPIRATION). Cued cough is weak and ineffective to remove aspirate. Pt heartrate was noted to become elevated as the study progressed, indicating significance of the effect of aspiration. Pt's friend was present during this study to facilitate communication, and he reported pt feels tired after drinking liquids, likely due to aspiration.   At this time, puree diet and honey thick liquid via teaspoon is recommended, with smaller more frequent meals for energy conservation. Meds should be crushed in puree, and no liquids (INCLUDING SOUPS) should be given unless thickened to honey consistency. ST will follow pt acutely for assessment of diet tolerance and education regarding safe swallow precautions.    Swallow Evaluation Recommendations  SLP Diet Recommendations: Dysphagia 1 (Puree) solids;Honey thick liquids   Medication Administration: Crushed with puree   Supervision: Patient able to self feed   Compensations: Minimize environmental distractions;Slow rate;Small sips/bites;Multiple dry swallows after each bite/sip   Postural Changes: Remain semi-upright after after feeds/meals (Comment);Seated  upright at 90 degrees   Oral Care Recommendations: Oral care QID;Staff/trained caregiver to provide oral care   Other Recommendations: Order thickener from pharmacy;Have oral suction available   Bruce [name] B. Bruce [name], [place] [phone]  Bruce [name] 11/27/2016,2:20 PM

## 2016-11-27 NOTE — Progress Notes (Signed)
[facility] Progress Note Patient Name: Bruce [name] DOB: 1961/02/03 MRN: [id number]   Date of Service  11/27/2016  HPI/Events of Note  Sinus Tachycardia - HR = 128.   eICU Interventions  Will order: 1. Metoprolol 2.5 mg IV Q 4 hours PRN HR > 120.     Intervention Category Intermediate Interventions: Arrhythmia - evaluation and management  Aida Lemaire [place] 11/27/2016, 5:50 PM

## 2016-11-27 NOTE — Progress Notes (Signed)
PCCM Progress Note  Admission date: 11/21/2016 Consult date: 11/25/2016 Referring provider: Dr. Wyline Copas, [facility]  CC: Short of breath  HPI: 56 yo male presented with progressive dyspnea, body aches, cough, wheezing.  He has hx of esophageal cancer with loculated Rt pleural effusion, SVC syndrome, tracheal compression on palliative chemotherapy as outpt.  He was admitted for sepsis from HCAP.  He was also noted to have b/l pleural effusions.  Subjective: Pt's family assisted with translation.  He still has cough.  Denies chest pain, abdominal pain, nausea, headache.  Notes swelling in face and arms. Feels better 1/10 but c x r worse. Rt lung opacification not fluid/.  BP 110 when measured on leg therefore will dc pressors. Vital signs: BP 105/76   Pulse (!) 111   Temp 97.5 F (36.4 C) (Axillary)   Resp 14   Ht '5\' 3"'$  (1.6 m)   Wt 98 lb 8.7 oz (44.7 kg)   SpO2 100%   BMI 17.46 kg/m   Intake/output: I/O last 3 completed shifts: In: 5312.1 [I.V.:3762.1; Other:1000; IV Piggyback:550] Out: 1250 [Urine:1250]  General: alert, non english speaking Neuro: normal strength, follows commands with daughter help HEENT: facial edema, no stridor Cardiac: regular, tachycardic Chest: severely diminished BS on Rt, basilar crackles on Lt, bilateral ue 3+ edema Abd: soft, non tender Ext: decreased muscle bulk in legs Skin: no rashes   CMP Latest Ref Rng & Units 11/27/2016 11/26/2016 11/25/2016  Glucose 65 - 99 mg/dL 116(H) 100(H) 99  BUN 6 - 20 mg/dL 15 18 21(H)  Creatinine 0.61 - 1.24 mg/dL 0.55(L) 0.51(L) 0.55(L)  Sodium 135 - 145 mmol/L 128(L) 126(L) 126(L)  Potassium 3.5 - 5.1 mmol/L 4.0 3.7 4.1  Chloride 101 - 111 mmol/L 93(L) 90(L) 93(L)  CO2 22 - 32 mmol/L '29 30 26  '$ Calcium 8.9 - 10.3 mg/dL 8.9 8.9 9.2  Total Protein 6.5 - 8.1 g/dL 6.4(L) - -  Total Bilirubin 0.3 - 1.2 mg/dL 0.6 - -  Alkaline Phos 38 - 126 U/L 166(H) - -  AST 15 - 41 U/L 39 - -  ALT 17 - 63 U/L 56 - -     CBC Latest Ref  Rng & Units 11/27/2016 11/26/2016 11/25/2016  WBC 4.0 - 10.5 K/uL 9.5 12.3(H) 13.8(H)  Hemoglobin 13.0 - 17.0 g/dL 8.9(L) 9.8(L) 9.3(L)  Hematocrit 39.0 - 52.0 % 26.5(L) 29.1(L) 27.7(L)  Platelets 150 - 400 K/uL 218 271 244     ABG    Component Value Date/Time   PHART 7.437 11/24/2016 0110   PCO2ART 33.6 11/24/2016 0110   PO2ART 90.7 11/24/2016 0110   HCO3 22.3 11/24/2016 0110   TCO2 27 02/01/2014 0444   ACIDBASEDEF 1.0 11/24/2016 0110   O2SAT 96.4 11/24/2016 0110     CBG (last 3)  No results for input(s): GLUCAP in the last 72 hours.   Imaging: Dg Chest 1 View  Result Date: 11/25/2016 CLINICAL DATA:  Status post thoracentesis EXAM: CHEST 1 VIEW COMPARISON:  12/13/2016 FINDINGS: Mild cardiac enlargement. There is a moderate right pleural effusion with diminished aeration to the right lung. The appearance is similar to previous exam. Left pleural effusion has resolved in the interval. IMPRESSION: 1. Persistent moderate right pleural effusion. 2. Decrease in left effusion. Electronically Signed   By: Kerby Moors M.D.   On: 11/25/2016 10:43   Dg Chest Port 1 View  Result Date: 11/27/2016 CLINICAL DATA:  Pleural effusion EXAM: PORTABLE CHEST 1 VIEW COMPARISON:  11/25/2016 FINDINGS: Complete opacification of the right  hemithorax compatible with right lung collapse and progressive effusion. Progressive left lower lobe airspace disease and small left effusion compared with the prior study. Negative for heart failure. IMPRESSION: There is now complete opacification of the right hemithorax compatible with enlarging right effusion and right lung collapse Progression of left lower lobe atelectasis/ infiltrate and small left effusion. Electronically Signed   By: Franchot Gallo M.D.   On: 11/27/2016 06:47   US Thoracentesis Asp Pleural Space W/img Guide  Result Date: 11/25/2016 INDICATION: Patient with history of metastatic esophageal cancer, bilateral pleural effusions left greater than right.  Request made for diagnostic and therapeutic left thoracentesis. EXAM: ULTRASOUND GUIDED DIAGNOSTIC AND THERAPEUTIC LEFT THORACENTESIS MEDICATIONS: None. COMPLICATIONS: None immediate. PROCEDURE: An ultrasound guided thoracentesis was thoroughly discussed with the patient and questions answered. The benefits, risks, alternatives and complications were also discussed. The patient understands and wishes to proceed with the procedure. Written consent was obtained. Ultrasound was performed to localize and mark an adequate pocket of fluid in the left chest. The area was then prepped and draped in the normal sterile fashion. 1% Lidocaine was used for local anesthesia. Under ultrasound guidance a Safe-T-Centesis catheter was introduced. Thoracentesis was performed. The catheter was removed and a dressing applied. FINDINGS: A total of approximately 910 cc of yellow fluid was removed. Samples were sent to the laboratory as requested by the clinical team. Patient's right pleural effusion was extensively multiloculated, therefore decision made to tap larger left effusion at this time. IMPRESSION: Successful ultrasound guided diagnostic and therapeutic left thoracentesis yielding 910 cc of pleural fluid. Read by: Rowe Robert, PA-C Electronically Signed   By: Sandi Mariscal M.D.   On: 11/25/2016 11:39     Studies: CT angio chest 1/07 >> mediastinal mass with narrowing of Rt PA and SVC, large B/L effusions increased on Lt and loculated on Rt, RLL consolidation, multiple nodules Lt thoracentesis 1/08 >> 910 ml yellow fluid, glucose 111, protein 3.4, LDH 195, WBC 915 (81%L)  Antibiotics: Vancomycin 1/06 >> 1/06 Zosyn 1/06 >> 1/06 Rocephine 1/07 >> Zithromax 1/07 >>  Cultures: Respiratory viral panel 1/07 >> rhinovirus Urine 1/07 >> negative Blood 1/07 >>  Lt pleural fluid 1/08 >>   Lines/tubes:  Events: 1/06 Admit 1/08 Oncology consulted 1/09 Started on pressors, DNR established  Summary: 56 yo male with  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  Had progressive hypotension from sepsis and started on pressors 11/26/16. 1/10 complete opacification rt lung ? Mucus plug vs effusion, [place] cw mucus plugging.  Assessment/plan:  Sepsis from pneumonia. procal 0.11, lactic acid <2 Cortisol nl - pressors to keep MAP > 60, SBP > 85 - Consider dc  solu corfef 1/10 -Check pressure in leg due to do bilateral SVC syndrome. SPT 115 on leg. DC pressors  Pneumonia with rhinovirus positive.  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 - XRT per radiation oncology - pt/family would like to continue palliative chemotherapy if possible -Continue diet  B/l pleural effusions >> loculated on Rt. 1/10 Complete opacification of rt lung. [place] 1/10 confirms no fluid for thora. - f/u Lt pleural fluid cx and cytology from thoracentesis 1/08 -May need FOB vs palliation  Acute hypoxic respiratory failure. 1/10 complete opacification of rt lung - oxygen to keep SpO2 > 92% -? FOB vs rt thora after view with [place].  Possible aspiration. - f/u with speech therapy when able  Goals of care. - DNR/DNI - if he doesn't improve and can't get to point to resume chemotherapy, then hospice/comfort care might be more appropriate. Full DNR but currently on pressors 1/10.   CC time 30 minutes    Richardson Landry Asmar Brozek ACNP [facility] PCCM Pager [phone] till 3 pm If no answer page [phone] 11/27/2016, 8:34 AM

## 2016-11-27 NOTE — Progress Notes (Signed)
Speech Language Pathology Education:   Dysphagia Patient Details Name: Vivaan Helseth MRN: [id number] DOB: 10-03-1961 Today's Date: 11/27/2016 Time: 6759-1638 SLP Time Calculation (min) (ACUTE ONLY): 9 min  Assessment / Plan / Recommendation Clinical Impression  Met briefly with pt and his daughter, who reports she was not present yesterday. Reviewed BSE results and recommendation for MBS to objectively assess swallow function and safety, and to identify least restrictive diet. Will schedule with radiology for this afternoon.  RN and MD aware. Report to follow.   HPI HPI: 56 year old non-English speaking male admitted 12/15/2016 due to coughing, choking and difficulty breathing.  PMH significant for metastatic esophageal CA (dx 2013), SVC syndrome, tracheal compression, lung mets, esophagectomy (2014). Barium Swallow was completed 02/2016, which revealed flash penetration, no hypopharyngeal stricture, no aspiration.       Celia B. Quentin Ore [facility], CCC-SLP [phone]  Shonna Chock 11/27/2016, 9:35 AM

## 2016-11-28 ENCOUNTER — Ambulatory Visit
Admission: RE | Admit: 2016-11-28 | Discharge: 2016-11-28 | Disposition: A | Discharge status: Home / Self Care | Payer: BLUE CROSS/BLUE SHIELD | Source: Ambulatory Visit | Attending: Radiation Oncology | Admitting: Radiation Oncology

## 2016-11-28 ENCOUNTER — Other Ambulatory Visit: Payer: BLUE CROSS/BLUE SHIELD

## 2016-11-28 ENCOUNTER — Inpatient Hospital Stay (HOSPITAL_COMMUNITY): Payer: BLUE CROSS/BLUE SHIELD

## 2016-11-28 ENCOUNTER — Ambulatory Visit: Payer: BLUE CROSS/BLUE SHIELD | Admitting: Nurse Practitioner

## 2016-11-28 ENCOUNTER — Encounter: Payer: Self-pay | Admitting: Oncology

## 2016-11-28 DIAGNOSIS — T17500A Unspecified foreign body in bronchus causing asphyxiation, initial encounter: Secondary | ICD-10-CM

## 2016-11-28 DIAGNOSIS — J9809 Other diseases of bronchus, not elsewhere classified: Secondary | ICD-10-CM

## 2016-11-28 DIAGNOSIS — J9601 Acute respiratory failure with hypoxia: Secondary | ICD-10-CM

## 2016-11-28 DIAGNOSIS — C7801 Secondary malignant neoplasm of right lung: Secondary | ICD-10-CM

## 2016-11-28 DIAGNOSIS — J9690 Respiratory failure, unspecified, unspecified whether with hypoxia or hypercapnia: Secondary | ICD-10-CM

## 2016-11-28 DIAGNOSIS — J910 Malignant pleural effusion: Secondary | ICD-10-CM

## 2016-11-28 LAB — DIFFERENTIAL
Basophils Absolute: 0 10*3/uL (ref 0.0–0.1)
Basophils Relative: 0 %
EOS PCT: 1 %
Eosinophils Absolute: 0.1 10*3/uL (ref 0.0–0.7)
LYMPHS PCT: 3 %
Lymphs Abs: 0.2 10*3/uL — ABNORMAL LOW (ref 0.7–4.0)
MONO ABS: 0.4 10*3/uL (ref 0.1–1.0)
MONOS PCT: 5 %
Neutro Abs: 7.2 10*3/uL (ref 1.7–7.7)
Neutrophils Relative %: 91 %

## 2016-11-28 LAB — BASIC METABOLIC PANEL
ANION GAP: 7 (ref 5–15)
BUN: 17 mg/dL (ref 6–20)
CALCIUM: 8.9 mg/dL (ref 8.9–10.3)
CO2: 27 mmol/L (ref 22–32)
Chloride: 92 mmol/L — ABNORMAL LOW (ref 101–111)
Creatinine, Ser: 0.45 mg/dL — ABNORMAL LOW (ref 0.61–1.24)
GFR calc non Af Amer: >60 mL/min (ref >60)
Glucose, Bld: 100 mg/dL — ABNORMAL HIGH (ref 65–99)
POTASSIUM: 3.6 mmol/L (ref 3.5–5.1)
Sodium: 126 mmol/L — ABNORMAL LOW (ref 135–145)

## 2016-11-28 LAB — CBC
HEMATOCRIT: 25.6 % — AB (ref 39.0–52.0)
HEMOGLOBIN: 8.6 g/dL — AB (ref 13.0–17.0)
MCH: 26.7 pg (ref 26.0–34.0)
MCHC: 33.6 g/dL (ref 30.0–36.0)
MCV: 79.5 fL (ref 78.0–100.0)
Platelets: 185 10*3/uL (ref 150–400)
RBC: 3.22 MIL/uL — AB (ref 4.22–5.81)
RDW: 13.7 % (ref 11.5–15.5)
WBC: 7.1 10*3/uL (ref 4.0–10.5)

## 2016-11-28 LAB — PROCALCITONIN: Procalcitonin: <0.1 ng/mL

## 2016-11-28 MED ORDER — PIPERACILLIN-TAZOBACTAM 3.375 G IVPB
3.3750 g | Freq: Three times a day (TID) | INTRAVENOUS | Status: DC
  Administered 2016-11-28 – 2016-11-30 (×7): 3.375 g via INTRAVENOUS
  Filled 2016-11-28 (×6): qty 50

## 2016-11-28 NOTE — Progress Notes (Addendum)
PROGRESS NOTE  Bruce [name]  HYQ:[id number] DOB: [birth year] DOA: 12/08/2016 PCP: No PCP Per Patient  Outpatient Specialists: Oncology, Dr. Benay Spice  Brief Narrative: Bruce [name] is a 56 y.o. male with a history of metastatic esophageal cancer complicated by loculated R pleural effusion, SVC syndrome and tracheal compression on palliative chemo and XRT who presented with sudden coughing and dyspnea. This was the day after routine irinotecan/cisplatin infusion. EMS was called and on arrival in ED he was tachycardic into 150's and hypoxemic requiring 5L. Lactate was 6.7, WBC 14.3, Na 126. CXR showed large right effusion, and left effusion, tracheal narrowing. CT PE protocol showed no PE but did show large loculated right effusion, moderate left effusion, known severe SVC and brachiocephalic narrowing, new severe pulmonary artery narrowing and progressive consolidation in right lung as well as irregular narrowing of right mainstem bronchus. He was given 30 cc/kg bolus, cultures were obtained, and vanc and Zosyn were administered. He was admitted for HCAP. He had therapeutic thoracentesis 1/8 with 900cc fluid return, and required pressor support for hypotension 1/9 and was transferred to [facility] service. Thus far hypotension has marginally improved and pressors have been weaned off as of 1/10. Hypotension was thought to be artifactual due to UE edema. Viral panel grew rhinovirus. Palliative chest XRT had been initiated 11/19/16 and was continued as a trial per oncology to assess response. Plan is to continue this while treating in SDU and reassess response and discharge with home hospice if unsuccessful.   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 Possibly contribution by aspiration PNA and rhinovirus.  - Abx as below - Appreciate pulmonology recommendations - Oxygen by nasal cannula to maintain SpO2 >92%  Sepsis with hypotension, suspect from pneumonia vs. SIRS from advanced malignancy: With evidence of aspiration, will cover pulmonary/anaerobes. Rhinovirus positive. PCT < 0.10. LA normalized, leukocytosis resolved. - Weaned from pressors. Per discussion with family this AM, would NOT want to be put back on pressors and confirmed NO CODE BLUE. Cortisol wnl, no indication for steroids. - Monitor BP on lower extremities.  - DC ceftriaxone/azithromycin >> zosyn for suspected ongoing aspiration. MRSA swab negative. After discussion with family, I believe allowing him to eat is indicated for ongoing nutrition and as a comfort measure. - Diet and aspiration precautions per SLP  Bilateral Large Pleural Effusion: Reported subjective improvement in dyspnea following thoracentesis 1/8, though CXR appears to be worsening. Complete opacification of right lung. Bedside U/S 1/10 confirmed right effusion not amenable to thoracentesis, not felt to be bronch candidate, certainly not a VATS candidate at this time.  - Monitor  Metastatic esophageal cancer:Discussed with Dr. Benay Spice. Plan is to continue XRT daily to measure response in a few days and refer for home vs. residential hospice if no improvement.  - Continue morphine SL or IV prn pain, dyspnea - Continue daily radiation, Dr. Benay Spice considering chemo 1/12.   Hyponatremia: Chronic.  - Monitor BMP, continue NS '@50cc'$ /hr  Normocytic anemia: -Suspect from chronic disease/cancer -Currently stable  DVT prophylaxis: Lovenox Code  Status: DNR Family Communication: Daughter at bedside who acts as interpretor due to patient refusing telephone interpretation services. Disposition Plan: Continue SDU management with hypotension and acute respiratory failure. Disposition pending outcome of XRT/chemo.   Consultants:   Pulmonology  Oncology, Dr. Benay Spice  Procedures:  CT angio chest 1/07 >> mediastinal mass with narrowing of Rt PA and SVC, large B/L effusions increased on Lt and loculated on Rt, RLL consolidation, multiple nodules Lt thoracentesis 1/08 >> 910 ml yellow fluid, glucose 111, protein 3.4, LDH 195, WBC 915 (81%L)  Antimicrobials: Vancomycin 1/06 >> 1/06 Zosyn 1/06 >> 1/06 Rocephin 1/07 >> 1/11 Zithromax 1/07 >> 1/11 Zosyn 1/11 >>   Subjective: Pt with stable dyspnea, had severe episode of trouble breathing last night which was better with oxygen. Appears miserable but states he wants to continue chemo and/or radiation if able. No chest pain.   Objective: Vitals:   11/28/16 0700 11/28/16 0722 11/28/16 0735 11/28/16 0800  BP: (!) 148/85 112/78  95/62  Pulse: (!) 128 (!) 126  (!) 118  Resp: (!) 21 (!) 23  11  Temp:      TempSrc:      SpO2: 98% 99% 100% 100%  Weight:      Height:        Intake/Output Summary (Last 24 hours) at 11/28/16 0917 Last data filed at 11/28/16 0800  Gross per 24 hour  Intake          1683.25 ml  Output              975 ml  Net           708.25 ml   Filed Weights   11/24/16 0500 11/27/16 0330  Weight: 43.3 kg (95 lb 7.4 oz) 44.7 kg (98 lb 8.7 oz)    Examination: General exam: 56 y.o. male in apparent discomfort but no respiratory distress Respiratory system: Mildly labored with supplemental oxygen. Absent R side breath sounds and diminished left at base. Cardiovascular system: Regular rate and rhythm. No murmur, rub, or gallop. + JVD, and no pedal edema. Facial swelling and plethora. UE's markedly edematous.  Gastrointestinal system: Abdomen soft, non-tender,  non-distended, with normoactive bowel sounds. No organomegaly or masses felt. Central nervous system: Alert and oriented. No focal neurological deficits. Extremities: Warm, no deformities Skin: No rashes, lesions no ulcers Psychiatry: Judgement and insight appear normal. Mood & affect appropriate.   Data Reviewed: I have personally reviewed following labs and imaging studies  CBC:  Recent Labs Lab 11/24/16 0110 11/24/16 0620 11/25/16 0342 11/26/16 0412 11/27/16 0402 11/28/16 0325  WBC 14.3* 10.1 13.8* 12.3* 9.5 7.1  NEUTROABS 13.9*  --   --   --   --   --   HGB 10.6* 9.8* 9.3* 9.8* 8.9* 8.6*  HCT 31.3* 28.0* 27.7* 29.1* 26.5* 25.6*  MCV 80.3 79.3 80.5 81.5 80.8 79.5  PLT 262 240 244 271 218 591   Basic Metabolic Panel:  Recent Labs Lab 11/24/16 0620 11/25/16 0342 11/26/16 0412 11/27/16 0402 11/28/16 0325  NA 127* 126* 126* 128* 126*  K 3.8 4.1 3.7 4.0 3.6  CL 93* 93* 90* 93* 92*  CO2 '25 26 30 29 27  '$ GLUCOSE 156* 99 100* 116* 100*  BUN 19 21* '18 15 17  '$ CREATININE 0.60* 0.55* 0.51* 0.55* 0.45*  CALCIUM 9.0 9.2 8.9 8.9 8.9   GFR: Estimated Creatinine Clearance: 66.7 mL/min (by C-G formula based on SCr of 0.45 mg/dL (L)). Liver Function  Tests:  Recent Labs Lab 11/27/16 0402  AST 39  ALT 56  ALKPHOS 166*  BILITOT 0.6  PROT 6.4*  ALBUMIN 2.7*   No results for input(s): LIPASE, AMYLASE in the last 168 hours. No results for input(s): AMMONIA in the last 168 hours. Coagulation Profile: No results for input(s): INR, PROTIME in the last 168 hours. Cardiac Enzymes: No results for input(s): CKTOTAL, CKMB, CKMBINDEX, TROPONINI in the last 168 hours. BNP (last 3 results) No results for input(s): PROBNP in the last 8760 hours. HbA1C: No results for input(s): HGBA1C in the last 72 hours. CBG: No results for input(s): GLUCAP in the last 168 hours. Lipid Profile: No results for input(s): CHOL, HDL, LDLCALC, TRIG, CHOLHDL, LDLDIRECT in the last 72 hours. Thyroid  Function Tests: No results for input(s): TSH, T4TOTAL, FREET4, T3FREE, THYROIDAB in the last 72 hours. Anemia Panel: No results for input(s): VITAMINB12, FOLATE, FERRITIN, TIBC, IRON, RETICCTPCT in the last 72 hours. Urine analysis:    Component Value Date/Time   COLORURINE YELLOW 11/24/2016 [place] 11/24/2016 0333   LABSPEC 1.020 11/24/2016 0333   LABSPEC 1.005 07/29/2014 1543   PHURINE 5.0 11/24/2016 0333   GLUCOSEU 100 (A) 11/24/2016 0333   GLUCOSEU Negative 07/29/2014 1543   HGBUR SMALL (A) 11/24/2016 0333   BILIRUBINUR NEGATIVE 11/24/2016 0333   BILIRUBINUR Negative 07/29/2014 1543   KETONESUR NEGATIVE 11/24/2016 0333   PROTEINUR NEGATIVE 11/24/2016 0333   UROBILINOGEN 0.2 07/29/2014 1543   NITRITE NEGATIVE 11/24/2016 0333   LEUKOCYTESUR NEGATIVE 11/24/2016 0333   LEUKOCYTESUR Negative 07/29/2014 1543   Sepsis Labs: '@LABRCNTIP'$ (procalcitonin:4,lacticidven:4)  ) Recent Results (from the past 240 hour(s))  Respiratory Panel by PCR     Status: Abnormal   Collection Time: 11/24/16  1:30 AM  Result Value Ref Range Status   Adenovirus NOT DETECTED NOT DETECTED Final   Coronavirus 229E NOT DETECTED NOT DETECTED Final   Coronavirus HKU1 NOT DETECTED NOT DETECTED Final   Coronavirus NL63 NOT DETECTED NOT DETECTED Final   Coronavirus OC43 NOT DETECTED NOT DETECTED Final   Metapneumovirus NOT DETECTED NOT DETECTED Final   Rhinovirus / Enterovirus DETECTED (A) NOT DETECTED Final   Influenza A NOT DETECTED NOT DETECTED Final   Influenza B NOT DETECTED NOT DETECTED Final   Parainfluenza Virus 1 NOT DETECTED NOT DETECTED Final   Parainfluenza Virus 2 NOT DETECTED NOT DETECTED Final   Parainfluenza Virus 3 NOT DETECTED NOT DETECTED Final   Parainfluenza Virus 4 NOT DETECTED NOT DETECTED Final   Respiratory Syncytial Virus NOT DETECTED NOT DETECTED Final   Bordetella pertussis NOT DETECTED NOT DETECTED Final   Chlamydophila pneumoniae NOT DETECTED NOT DETECTED  Final   Mycoplasma pneumoniae NOT DETECTED NOT DETECTED Final    Comment: Performed at [facility]  Blood Culture (routine x 2)     Status: None (Preliminary result)   Collection Time: 11/24/16  2:10 AM  Result Value Ref Range Status   Specimen Description BLOOD LEFT HAND  Final   Special Requests BOTTLES DRAWN AEROBIC AND ANAEROBIC 5ML  Final   Culture   Final    NO GROWTH 3 DAYS Performed at [facility]    Report Status PENDING  Incomplete  Blood Culture (routine x 2)     Status: None (Preliminary result)   Collection Time: 11/24/16  2:12 AM  Result Value Ref Range Status   Specimen Description BLOOD LEFT HAND  Final   Special Requests BOTTLES DRAWN AEROBIC AND ANAEROBIC 5ML  Final  Culture   Final    NO GROWTH 3 DAYS Performed at [facility]    Report Status PENDING  Incomplete  Urine culture     Status: None   Collection Time: 11/24/16  3:33 AM  Result Value Ref Range Status   Specimen Description URINE, CLEAN CATCH  Final   Special Requests NONE  Final   Culture NO GROWTH Performed at [facility]   Final   Report Status 11/25/2016 FINAL  Final  MRSA PCR Screening     Status: None   Collection Time: 11/24/16  5:41 AM  Result Value Ref Range Status   MRSA by PCR NEGATIVE NEGATIVE Final    Comment:        The GeneXpert MRSA Assay (FDA approved for NASAL specimens only), is one component of a comprehensive MRSA colonization surveillance program. It is not intended to diagnose MRSA infection nor to guide or monitor treatment for MRSA infections.   Culture, body fluid-bottle     Status: None (Preliminary result)   Collection Time: 11/25/16 10:27 AM  Result Value Ref Range Status   Specimen Description PLEURAL  Final   Special Requests NONE  Final   Culture   Final    NO GROWTH 2 DAYS Performed at [facility]    Report Status PENDING  Incomplete  Gram stain     Status: None   Collection Time: 11/25/16 10:27 AM  Result  Value Ref Range Status   Specimen Description PLEURAL  Final   Special Requests NONE  Final   Gram Stain   Final    FEW WBC PRESENT, PREDOMINANTLY MONONUCLEAR NO ORGANISMS SEEN Performed at [facility]    Report Status 11/25/2016 FINAL  Final     Radiology Studies: Dg Chest Port 1 View  Result Date: 11/28/2016 CLINICAL DATA:  56 year old male with respiratory failure, sepsis, esophageal cancer with lung metastases. Initial encounter. EXAM: PORTABLE CHEST 1 VIEW COMPARISON:  11/27/2016 portable chest and earlier. Speech pathology swallow study 11/27/2016. FINDINGS: Portable AP semi upright view at 0438 hours. There is a small volume of residual barium from the swallow study yesterday now projecting in the epigastrium and also along the midline of the chest, and the latter might be within the esophagus or trachea. Continued near complete white out of the right lung, only trace gas visible in the right apex and about the right hilum. Continued confluent retrocardiac opacity on the left compatible with lower lobe collapse or consolidation. No pneumothorax. No pulmonary edema. Visible mediastinal contours are stable. IMPRESSION: 1. Continued subtotal opacification of the right hemithorax likely representing a combination of large pleural collection and consolidated or drowned right lung in this setting. 2. Stable left lung ventilation with lower lobe collapse or consolidation. 3. Small volume of retained barium from the speech pathology swallow study yesterday. Electronically Signed   By: Genevie Ann M.D.   On: 11/28/2016 06:54   Dg Chest Port 1 View  Result Date: 11/27/2016 CLINICAL DATA:  Pleural effusion EXAM: PORTABLE CHEST 1 VIEW COMPARISON:  11/25/2016 FINDINGS: Complete opacification of the right hemithorax compatible with right lung collapse and progressive effusion. Progressive left lower lobe airspace disease and small left effusion compared with the prior study. Negative for heart  failure. IMPRESSION: There is now complete opacification of the right hemithorax compatible with enlarging right effusion and right lung collapse Progression of left lower lobe atelectasis/ infiltrate and small left effusion. Electronically Signed   By: Franchot Gallo M.D.  On: 11/27/2016 06:47   Dg Swallowing Func-speech Pathology  Result Date: 11/27/2016 Objective Swallowing Evaluation: Type of Study: MBS-Modified Barium Swallow Study Patient Details Name: Camaron Cammack MRN: [id number] Date of Birth: [birth year] Today's Date: 11/27/2016 Time: SLP Start Time (ACUTE ONLY): 1320-SLP Stop Time (ACUTE ONLY): 1340 SLP Time Calculation (min) (ACUTE ONLY): 20 min Past Medical History: Past Medical History: Diagnosis Date . Cancer ([place]) 05/28/12  bx=esophagus=squamous cell carcinoma . Dysphasia   solid and liquid . Esophagitis  . Lung cancer ([place]) 12/27/13  right upper lung . Mass of esophagus 05/28/2012  BX'D DISTAL ESOPHAGUS,PENDING . Neuropathy ([place])   right hand numbness 1 year 2012 Past Surgical History: Past Surgical History: Procedure Laterality Date . COMPLETE ESOPHAGECTOMY  08/26/2012  Procedure: ESOPHAGECTOMY COMPLETE;  Surgeon: [name], MD;  Location: [facility] OR;  Service: Thoracic;  Laterality: N/A;  transhiatal  . 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 EUS  06/11/2012  Procedure: UPPER ENDOSCOPIC ULTRASOUND (EUS) RADIAL;  Surgeon: Milus Banister, MD;  Location: [facility] END[facility];  Service: Endoscopy;  Laterality: N/A; . JEJUNOSTOMY  08/26/2012  Procedure: JEJUNOSTOMY;  Surgeon: [name], MD;  Location: [place];  Service: Thoracic;  Laterality: N/A;  placement of feeding jujunostomy tube . LYMPH NODE DISSECTION Right 01/31/2014  Procedure: LYMPH NODE DISSECTION;  Surgeon: [name], MD;  Location: [place];  Service: Thoracic;  Laterality: Right; . PYLOROPLASTY  08/26/2012  Procedure: PYLOROPLASTY;  Surgeon: [name], MD;  Location: [place];  Service: Thoracic;  Laterality: N/A; . VIDEO ASSISTED THORACOSCOPY (VATS)/WEDGE RESECTION Right 01/31/2014  Procedure: VIDEO ASSISTED THORACOSCOPY (VATS)/WEDGE RESECTION; with insertion of On Q pain pump;  Surgeon: [name], MD;  Location: [place];  Service: Thoracic;  Laterality: Right;  with insertion of On Q pain pump . VIDEO BRONCHOSCOPY  08/26/2012  Procedure: VIDEO BRONCHOSCOPY;  Surgeon: [name], MD;  Location: [place];  Service: Thoracic;  Laterality: N/A; . VIDEO BRONCHOSCOPY N/A 01/31/2014  Procedure: VIDEO BRONCHOSCOPY;  Surgeon: [name], MD;  Location[facility] OR;  Service: Thoracic;  Laterality: N/A; HPI: 56 year old non-English speaking male admitted 12/12/2016 due to coughing, choking and difficulty breathing.  PMH significant for metastatic esophageal CA (dx 2013), SVC syndrome, tracheal compression, lung mets, esophagectomy (2014). Barium Swallow was completed 02/2016, which revealed flash penetration, no hypopharyngeal stricture, no aspiration.  Subjective: friend present for translation Assessment / Plan / Recommendation CHL IP CLINICAL IMPRESSIONS 11/27/2016 Therapy Diagnosis Mild oral phase dysphagia;Severe pharyngeal phase dysphagia Clinical Impression Pt presents with mild oral, severe pharyngeal dysphagia, characterized orally by piecemeal deglutition of honey and puree consistencies. Pharyngeally, pt exhibits delayed swallow reflex with trigger at the level of the vallecular sinus across consistencies. Additionally, trace post-swallow vallecular residue is seen across consistencies, which pt is able to clear with cued dry swallow. Of primary concern is pt's gross aspiration of thin and nectar thick liquids with no cough response (SILENT ASPIRATION). Cued cough is weak and ineffective to remove aspirate. Pt heart rate was noted to become elevated as the study progressed, possibly indicating significance of the effect of aspiration. Pt's friend was  present during this study to facilitate communication, and he reported pt feels tired after drinking liquids, likely due to aspiration. At this time, puree diet and honey thick liquid via teaspoon is recommended, with smaller more frequent meals for energy conservation. Meds should be crushed in puree, and no  liquids (INCLUDING SOUPS) should be given unless thickened to honey consistency. ST will follow pt acutely for assessment of diet tolerance and education regarding safe swallow precautions.  Impact on safety and function Moderate aspiration risk;Severe aspiration risk   CHL IP TREATMENT RECOMMENDATION 11/27/2016 Treatment Recommendations Therapy as outlined in treatment plan below   Prognosis 11/27/2016 Prognosis for Safe Diet Advancement Guarded CHL IP DIET RECOMMENDATION 11/27/2016 SLP Diet Recommendations Dysphagia 1 (Puree) solids;Honey thick liquids Medication Administration Crushed with puree Compensations Minimize environmental distractions;Slow rate;Small sips/bites;Multiple dry swallows after each bite/sip Postural Changes Remain semi-upright after after feeds/meals (Comment);Seated upright at 90 degrees   CHL IP OTHER RECOMMENDATIONS 11/27/2016 Oral Care Recommendations Oral care QID;Staff/trained caregiver to provide oral care Other Recommendations Order thickener from pharmacy;Have oral suction available   CHL IP FOLLOW UP RECOMMENDATIONS 11/26/2016 Follow up Recommendations F/u ST at next venue   CHL IP FREQUENCY AND DURATION 11/27/2016 Speech Therapy Frequency (ACUTE ONLY) min 2x/week Treatment Duration 2 weeks      CHL IP ORAL PHASE 11/27/2016 Oral Phase Impaired Oral - Honey Teaspoon Piecemeal swallowing Oral - Puree Piecemeal swallowing  CHL IP PHARYNGEAL PHASE 11/27/2016 Pharyngeal Phase Impaired Pharyngeal- Honey Teaspoon Delayed swallow initiation-vallecula;Reduced tongue base retraction;Pharyngeal residue - valleculae Pharyngeal- Nectar Cup Delayed swallow initiation-vallecula;Reduced  airway/laryngeal closure;Penetration/Aspiration before swallow;Reduced tongue base retraction;Pharyngeal residue - valleculae Pharyngeal Material enters airway, passes BELOW cords without attempt by patient to eject out (silent aspiration) Pharyngeal- Thin Teaspoon Delayed swallow initiation-vallecula;Reduced airway/laryngeal closure;Penetration/Aspiration before swallow;Pharyngeal residue - valleculae;Reduced tongue base retraction Pharyngeal Material enters airway, passes BELOW cords without attempt by patient to eject out (silent aspiration) Pharyngeal- Puree Delayed swallow initiation-vallecula;Reduced tongue base retraction;Pharyngeal residue - valleculae  CHL IP CERVICAL ESOPHAGEAL PHASE 11/27/2016 Cervical Esophageal Phase [facility] Celia B. Honor, [facility], CCC-SLP [phone] Shonna Chock 11/27/2016, 2:15 PM     Scheduled Meds: . azithromycin  500 mg Intravenous Q24H  . cefTRIAXone (ROCEPHIN)  IV  1 g Intravenous Q24H  . chlorhexidine  15 mL Mouth Rinse BID  . enoxaparin (LOVENOX) injection  30 mg Subcutaneous Daily  . feeding supplement  1 Container Oral BID BM  . feeding supplement (ENSURE ENLIVE)  237 mL Oral BID BM  . guaiFENesin  600 mg Oral BID  . mouth rinse  15 mL Mouth Rinse q12n4p  . multivitamin with minerals  1 tablet Oral Daily  . sodium chloride flush  3 mL Intravenous Q12H   Continuous Infusions: . sodium chloride 50 mL/hr at 11/27/16 2133     LOS: 4 days   Time spent: 35 minutes.  Vance Gather, MD [facility] Pager [phone]  If 7PM-7AM, please contact night-coverage [email or web address] Password [facility] 11/28/2016, 9:17 AM

## 2016-11-28 NOTE — Progress Notes (Signed)
Pharmacy Antibiotic Note  Bruce [name] is a 56 y.o. male admitted on 11/26/2016 with CAP, SVC syndrome. H/o esophageal cancer on chemotherapy.  On day #5 ceftriaxone and azithromycin.  Now concerned for aspiriation pneumonia.  Pharmacy has been consulted for Zosyn dosing.  1/10 complete opacification rt lung - concern for loculated effusion on [place] 1/10, +/- additional mucus plugging / aspiration.  Plan: Zosyn 3.375g IV q8h (4 hour infusion time). Patient's renal function is stable, CrCl>20 ml/min so do not anticipate dose adjustments.  Pharmacy will sign off at this time.  Please re-consult if needed.  Height: '5\' 3"'$  (160 cm) Weight: 98 lb 8.7 oz (44.7 kg) IBW/kg (Calculated) : 56.9  Temp (24hrs), Avg:97.1 F (36.2 C), Min:96.5 F (35.8 C), Max:97.4 F (36.3 C)   Recent Labs Lab 11/24/16 0202 11/24/16 0620 11/24/16 0911 11/25/16 0342 11/26/16 0412 11/27/16 0402 11/28/16 0325  WBC  --  10.1  --  13.8* 12.3* 9.5 7.1  CREATININE  --  0.60*  --  0.55* 0.51* 0.55* 0.45*  LATICACIDVEN 6.75* 3.5* 1.9  --   --   --   --     Estimated Creatinine Clearance: 66.7 mL/min (by C-G formula based on SCr of 0.45 mg/dL (L)).    Allergies  Allergen Reactions  . Carboplatin Nausea Only and Other (See Comments)    Facial flushing; Headache---"family states he is not allergic to anything"    Antimicrobials this admission:  1/7 zosyn >> 1/7, 1/11 >> 1/7 vancomycin >> 1/7 1/7 rocephin >> 1/11 1/7 zmax >> 1/11 1/7 tamiflu >> 1/7  Dose adjustments this admission:  -  Microbiology results:  1/7 +Rhinovirus 1/7 BCx: ngtd 1/7 UCx neg 1/7 MRSA neg 1/8 Pleural fluid gram stain: no organisms seen, cx: ngtd  Thank you for allowing pharmacy to be a part of this patient's care.  Hershal Coria 11/28/2016 10:01 AM

## 2016-11-28 NOTE — Progress Notes (Signed)
Nutrition Follow-up  DOCUMENTATION CODES:   Underweight  INTERVENTION:  Continue Boost Breeze and Ensure Enlive orders. - Continue to encourage family to bring foods from home that pt enjoys and that are appropriate consistency. - RD will continue to monitor for needs.   NUTRITION DIAGNOSIS:   Increased nutrient needs related to catabolic illness, cancer and cancer related treatments as evidenced by estimated needs. -ongoing  GOAL:   Patient will meet greater than or equal to 90% of their needs -unmet  MONITOR:   PO intake, Weight trends, Labs, I & O's  ASSESSMENT:   56 y.o. male with a past medical history significant for metastatic esophageal cancer c/b loculated R pleural effusion, SVC syndrome and tracheal compression on palliative chemorad who presents with sudden coughing.  1/11 Spoke with family at bedside; spoke with daughter who provided all information and asked all questions. Unable to determine how much pt is eating, but family is aware of appropriate food and drink consistency. Resource Thicken Up on bedside table. Family has been bringing in a thick rice soup which pt likes as it is a dish he consumed often at home. Daughter reports pt has also been eating mashed potatoes frequently.   Per review, pt has received 2/4 Boost Breeze and 5/8 Ensure supplements over the past few days. Will maintain current orders at this time and adjust if needed. Per rounds this AM, pt to go for Palliative radiation this afternoon.   Unable to perform physical assessment this visit d/t family request for pt's comfort. Per chart review, weight +1.4 kg from 1/7-1/10 which may be fluid related. Will continue to monitor weight trends.  Medications reviewed; daily multivitamin with minerals, PRN oral Compazine Lab reviewed; Na: 126 mmol/L, Cl: 92 mmol/L, creatinine: 0.45 mg/dL.  IVF: NS @ 50 mL/hr.     1/9 - Pt has been on CLD since 1/7 at 1300; will order Boost Breeze d/t this order.   - Ensure Jeanne Ivan is already ordered but would not be appropriate for CLD.  - Pt with esophageal cancer and was started on palliative radiation 1/2 and palliative chemo 1/5.  - Unable to speak with pt or family d/t their prolonged discussion with other care providers. - Unable to complete physical assessment at this time.  - Per chart review, pt has lost 6 lbs (6% body weight) in the past 2.5 months which is not significant for time frame.  - Pt likely meets criteria for malnutrition but unable to confirm without physical assessment and talking with pt/family.  IVF: NS @ 75 mL/hr.    Diet Order:  Diet regular Room service appropriate? Yes; Fluid consistency: Honey Thick  Skin:  Reviewed, no issues  Last BM:  PTA/unknown  Height:   Ht Readings from Last 1 Encounters:  11/24/16 '5\' 3"'$  (1.6 m)    Weight:   Wt Readings from Last 1 Encounters:  11/27/16 98 lb 8.7 oz (44.7 kg)    Ideal Body Weight:  56.36 kg  BMI:  Body mass index is 17.46 kg/m.  Estimated Nutritional Needs:   Kcal:  1515-1730 (35-40 kcal/kg)  Protein:  61-74 grams (1.4-1.7 grams/kg)  Fluid:  >/= 1.8 L/day  EDUCATION NEEDS:   No education needs identified at this time    Jarome Matin, MS, RD, LDN, CNSC Inpatient Clinical Dietitian Pager # [phone] After hours/weekend pager # [phone]

## 2016-11-28 NOTE — Progress Notes (Signed)
IP PROGRESS NOTE  Subjective:   His daughter is at the bedside. She reports she had shortness of breath and increased cough during the night.  Objective: Vital signs in last 24 hours: Blood pressure 132/75, pulse (!) 145, temperature 97.4 F (36.3 C), temperature source Axillary, resp. rate (!) 29, height '5\' 3"'$  (1.6 m), weight 98 lb 8.7 oz (44.7 kg), SpO2 90 %.  Intake/Output from previous day: 01/10 0701 - 01/11 0700 In: 1596.8 [I.V.:1346.8; IV Piggyback:250] Out: 975 [Urine:975]  Physical Exam:  HEENT:  the face and neck are swollen, no thrush, firm nodal mass in the left lower neck. Lungs:  decreased breath sounds over the right chest, increased respiratory rate Cardiac:  regular rate and rhythm Abdomen:  no hepatomegaly, nontender Extremities:  no leg edema, 1+ edema in both arms   Lab Results:  Recent Labs  11/27/16 0402 11/28/16 0325  WBC 9.5 7.1  HGB 8.9* 8.6*  HCT 26.5* 25.6*  PLT 218 185    BMET  Recent Labs  11/27/16 0402 11/28/16 0325  NA 128* 126*  K 4.0 3.6  CL 93* 92*  CO2 29 27  GLUCOSE 116* 100*  BUN 15 17  CREATININE 0.55* 0.45*  CALCIUM 8.9 8.9    Studies/Results: Dg Chest Port 1 View  Result Date: 11/28/2016 CLINICAL DATA:  56 year old male with respiratory failure, sepsis, esophageal cancer with lung metastases. Initial encounter. EXAM: PORTABLE CHEST 1 VIEW COMPARISON:  11/27/2016 portable chest and earlier. Speech pathology swallow study 11/27/2016. FINDINGS: Portable AP semi upright view at 0438 hours. There is a small volume of residual barium from the swallow study yesterday now projecting in the epigastrium and also along the midline of the chest, and the latter might be within the esophagus or trachea. Continued near complete white out of the right lung, only trace gas visible in the right apex and about the right hilum. Continued confluent retrocardiac opacity on the left compatible with lower lobe collapse or consolidation. No  pneumothorax. No pulmonary edema. Visible mediastinal contours are stable. IMPRESSION: 1. Continued subtotal opacification of the right hemithorax likely representing a combination of large pleural collection and consolidated or drowned right lung in this setting. 2. Stable left lung ventilation with lower lobe collapse or consolidation. 3. Small volume of retained barium from the speech pathology swallow study yesterday. Electronically Signed   By: Genevie Ann M.D.   On: 11/28/2016 06:54   Dg Chest Port 1 View  Result Date: 11/27/2016 CLINICAL DATA:  Pleural effusion EXAM: PORTABLE CHEST 1 VIEW COMPARISON:  11/25/2016 FINDINGS: Complete opacification of the right hemithorax compatible with right lung collapse and progressive effusion. Progressive left lower lobe airspace disease and small left effusion compared with the prior study. Negative for heart failure. IMPRESSION: There is now complete opacification of the right hemithorax compatible with enlarging right effusion and right lung collapse Progression of left lower lobe atelectasis/ infiltrate and small left effusion. Electronically Signed   By: Franchot Gallo M.D.   On: 11/27/2016 06:47   Dg Swallowing Func-speech Pathology  Result Date: 11/27/2016 Objective Swallowing Evaluation: Type of Study: MBS-Modified Barium Swallow Study Patient Details Name: Gaylan Fauver MRN: [id number] Date of Birth: [birth year] Today's Date: 11/27/2016 Time: SLP Start Time (ACUTE ONLY): 1320-SLP Stop Time (ACUTE ONLY): 1340 SLP Time Calculation (min) (ACUTE ONLY): 20 min Past Medical History: Past Medical History: Diagnosis Date . 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 Dysphasia   solid and liquid . Esophagitis  . Lung cancer (  [place]) 12/27/13  right upper lung . Mass of esophagus 05/28/2012  BX'D DISTAL ESOPHAGUS,PENDING . Neuropathy ([place])   right hand numbness 1 year 2012 Past Surgical History: Past Surgical History: Procedure Laterality Date . COMPLETE  ESOPHAGECTOMY  08/26/2012  Procedure: ESOPHAGECTOMY COMPLETE;  Surgeon: [name], MD;  Location: [facility] OR;  Service: Thoracic;  Laterality: N/A;  transhiatal  . 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 EUS  06/11/2012  Procedure: UPPER ENDOSCOPIC ULTRASOUND (EUS) RADIAL;  Surgeon: Milus Banister, MD;  Location: [facility] ENDOSCOPY;  Service: Endoscopy;  Laterality: N/A; . JEJUNOSTOMY  08/26/2012  Procedure: JEJUNOSTOMY;  Surgeon: [name], MD;  Location: [place];  Service: Thoracic;  Laterality: N/A;  placement of feeding jujunostomy tube . LYMPH NODE DISSECTION Right 01/31/2014  Procedure: LYMPH NODE DISSECTION;  Surgeon: [name], MD;  Location: [place];  Service: Thoracic;  Laterality: Right; . PYLOROPLASTY  08/26/2012  Procedure: PYLOROPLASTY;  Surgeon: [name], MD;  Location: [place];  Service: Thoracic;  Laterality: N/A; . VIDEO ASSISTED THORACOSCOPY (VATS)/WEDGE RESECTION Right 01/31/2014  Procedure: VIDEO ASSISTED THORACOSCOPY (VATS)/WEDGE RESECTION; with insertion of On Q pain pump;  Surgeon: [name], MD;  Location: [place];  Service: Thoracic;  Laterality: Right;  with insertion of On Q pain pump . VIDEO BRONCHOSCOPY  08/26/2012  Procedure: VIDEO BRONCHOSCOPY;  Surgeon: [name], MD;  Location: [place];  Service: Thoracic;  Laterality: N/A; . VIDEO BRONCHOSCOPY N/A 01/31/2014  Procedure: VIDEO BRONCHOSCOPY;  Surgeon: [name], MD;  Location: [facility] OR;  Service: Thoracic;  Laterality: N/A; HPI: 56 year old non-English speaking male admitted 11/21/2016 due to coughing, choking and difficulty breathing.  PMH significant for metastatic esophageal CA (dx 2013), SVC syndrome, tracheal compression, lung mets, esophagectomy (2014). Barium Swallow was completed 02/2016, which revealed flash penetration, no hypopharyngeal stricture, no aspiration.  Subjective: friend present for translation Assessment / Plan  / Recommendation CHL IP CLINICAL IMPRESSIONS 11/27/2016 Therapy Diagnosis Mild oral phase dysphagia;Severe pharyngeal phase dysphagia Clinical Impression Pt presents with mild oral, severe pharyngeal dysphagia, characterized orally by piecemeal deglutition of honey and puree consistencies. Pharyngeally, pt exhibits delayed swallow reflex with trigger at the level of the vallecular sinus across consistencies. Additionally, trace post-swallow vallecular residue is seen across consistencies, which pt is able to clear with cued dry swallow. Of primary concern is pt's gross aspiration of thin and nectar thick liquids with no cough response (SILENT ASPIRATION). Cued cough is weak and ineffective to remove aspirate. Pt heart rate was noted to become elevated as the study progressed, possibly indicating significance of the effect of aspiration. Pt's friend was present during this study to facilitate communication, and he reported pt feels tired after drinking liquids, likely due to aspiration. At this time, puree diet and honey thick liquid via teaspoon is recommended, with smaller more frequent meals for energy conservation. Meds should be crushed in puree, and no liquids (INCLUDING SOUPS) should be given unless thickened to honey consistency. ST will follow pt acutely for assessment of diet tolerance and education regarding safe swallow precautions.  Impact on safety and function Moderate aspiration risk;Severe aspiration risk   CHL IP TREATMENT RECOMMENDATION 11/27/2016 Treatment Recommendations Therapy as outlined in treatment plan below   Prognosis 11/27/2016 Prognosis for Safe Diet Advancement Guarded CHL IP DIET RECOMMENDATION 11/27/2016 SLP Diet Recommendations Dysphagia 1 (Puree) solids;Honey thick liquids Medication Administration Crushed with puree Compensations Minimize environmental distractions;Slow  rate;Small sips/bites;Multiple dry swallows after each bite/sip Postural Changes Remain semi-upright after after  feeds/meals (Comment);Seated upright at 90 degrees   CHL IP OTHER RECOMMENDATIONS 11/27/2016 Oral Care Recommendations Oral care QID;Staff/trained caregiver to provide oral care Other Recommendations Order thickener from pharmacy;Have oral suction available   CHL IP FOLLOW UP RECOMMENDATIONS 11/26/2016 Follow up Recommendations F/u ST at next venue   CHL IP FREQUENCY AND DURATION 11/27/2016 Speech Therapy Frequency (ACUTE ONLY) min 2x/week Treatment Duration 2 weeks      CHL IP ORAL PHASE 11/27/2016 Oral Phase Impaired Oral - Honey Teaspoon Piecemeal swallowing Oral - Puree Piecemeal swallowing  CHL IP PHARYNGEAL PHASE 11/27/2016 Pharyngeal Phase Impaired Pharyngeal- Honey Teaspoon Delayed swallow initiation-vallecula;Reduced tongue base retraction;Pharyngeal residue - valleculae Pharyngeal- Nectar Cup Delayed swallow initiation-vallecula;Reduced airway/laryngeal closure;Penetration/Aspiration before swallow;Reduced tongue base retraction;Pharyngeal residue - valleculae Pharyngeal Material enters airway, passes BELOW cords without attempt by patient to eject out (silent aspiration) Pharyngeal- Thin Teaspoon Delayed swallow initiation-vallecula;Reduced airway/laryngeal closure;Penetration/Aspiration before swallow;Pharyngeal residue - valleculae;Reduced tongue base retraction Pharyngeal Material enters airway, passes BELOW cords without attempt by patient to eject out (silent aspiration) Pharyngeal- Puree Delayed swallow initiation-vallecula;Reduced tongue base retraction;Pharyngeal residue - valleculae  CHL IP CERVICAL ESOPHAGEAL PHASE 11/27/2016 Cervical Esophageal Phase [facility] Celia B. [place], [facility], CCC-SLP [phone] Shonna Chock 11/27/2016, 2:15 PM     Medications: I have reviewed the patient's current medications.  Assessment/Plan:  1.metastatic esophagus cancer-progressive disease in the chest confirmed on CT 11/06/2016    palliative chest radiation and initiated 11/19/2016  Salvage weekly  irinotecan/cisplatin chemotherapy beginning 11/22/00/2018  2. Admission 11/22/2016 with respiratory failure-likely secondary to tumor progression in the lungs and increased effusions  Palliative left thoracentesis 11/25/2016 3. Chest/back pain secondary to #1 4. Anemia secondary to chronic disease 5. SVC syndrome secondary to #1 6. Hypotension-likely secondary to #5   Mr. Crumpacker appears unchanged. He has persistent respiratory failure. The hypotension has improved and he is now off of pressor support.  I discussed options of comfort care/hospice versus continuing radiation/chemotherapy with Mr. Clanton via his daughter. They would like to complete the course of radiation. If there is been no clinical improvement at the completion of radiation the plan will be to initiate home Hospice care. We discussed [employer], but his daughter stated she would like to have him at home.   Recommendations: 1. Continue oxygen and morphine for dyspnea 2. Plan for week #2 irinotecan/cisplatin as an inpatient on 11/29/2016 3. Continue palliative radiation to the chest    LOS: 4 days   Betsy Coder, MD   11/28/2016, 2:44 PM

## 2016-11-28 NOTE — Progress Notes (Signed)
Pt resting comfortably at this time.  RT will hold CPT until pt awakens.  RT to monitor and assess as needed.

## 2016-11-28 NOTE — Progress Notes (Signed)
PCCM Progress Note  Admission date: 12/06/2016 Consult date: 11/25/2016 Referring provider: Dr. Wyline Copas, [facility]  CC: Short of breath  HPI: 56 y/o male presented with progressive dyspnea, body aches, cough, wheezing.  He has hx of esophageal cancer with loculated Rt pleural effusion, SVC syndrome, tracheal compression on palliative chemotherapy as outpt.  He was admitted for sepsis from HCAP.  He was also noted to have b/l pleural effusions.  Subjective:  RN reports patient has intermittent episodes of SOB > responds well to morphine.  Tolerating PO's.  Daughter assists with translation.    Vital signs: BP 95/62 (BP Location: Right Arm)   Pulse (!) 118   Temp (!) 96.8 F (36 C) (Axillary)   Resp 11   Ht '5\' 3"'$  (1.6 m)   Wt 98 lb 8.7 oz (44.7 kg)   SpO2 100%   BMI 17.46 kg/m   Intake/output: I/O last 3 completed shifts: In: 3093.3 [I.V.:2343.3; IV Piggyback:750] Out: 1325 [YBWLS:9373]  General: alert, non english speaking Neuro: normal strength, follows commands with daughter help HEENT: facial edema, no stridor Cardiac: regular, tachycardic Chest: severely diminished BS on Rt / bronchial breath sounds, crackles on L Abd: soft, non tender Ext: decreased muscle bulk in legs, BUE 3+ swelling  Skin: no rashes or lesions   CMP Latest Ref Rng & Units 11/28/2016 11/27/2016 11/26/2016  Glucose 65 - 99 mg/dL 100(H) 116(H) 100(H)  BUN 6 - 20 mg/dL '17 15 18  '$ Creatinine 0.61 - 1.24 mg/dL 0.45(L) 0.55(L) 0.51(L)  Sodium 135 - 145 mmol/L 126(L) 128(L) 126(L)  Potassium 3.5 - 5.1 mmol/L 3.6 4.0 3.7  Chloride 101 - 111 mmol/L 92(L) 93(L) 90(L)  CO2 22 - 32 mmol/L '27 29 30  '$ Calcium 8.9 - 10.3 mg/dL 8.9 8.9 8.9  Total Protein 6.5 - 8.1 g/dL - 6.4(L) -  Total Bilirubin 0.3 - 1.2 mg/dL - 0.6 -  Alkaline Phos 38 - 126 U/L - 166(H) -  AST 15 - 41 U/L - 39 -  ALT 17 - 63 U/L - 56 -     CBC Latest Ref Rng & Units 11/28/2016 11/27/2016 11/26/2016  WBC 4.0 - 10.5 K/uL 7.1 9.5 12.3(H)  Hemoglobin 13.0  - 17.0 g/dL 8.6(L) 8.9(L) 9.8(L)  Hematocrit 39.0 - 52.0 % 25.6(L) 26.5(L) 29.1(L)  Platelets 150 - 400 K/uL 185 218 271     ABG    Component Value Date/Time   PHART 7.437 11/24/2016 0110   PCO2ART 33.6 11/24/2016 0110   PO2ART 90.7 11/24/2016 0110   HCO3 22.3 11/24/2016 0110   TCO2 27 02/01/2014 0444   ACIDBASEDEF 1.0 11/24/2016 0110   O2SAT 96.4 11/24/2016 0110     CBG (last 3)  No results for input(s): GLUCAP in the last 72 hours.   Imaging: Dg Chest Port 1 View  Result Date: 11/28/2016 CLINICAL DATA:  56 year old male with respiratory failure, sepsis, esophageal cancer with lung metastases. Initial encounter. EXAM: PORTABLE CHEST 1 VIEW COMPARISON:  11/27/2016 portable chest and earlier. Speech pathology swallow study 11/27/2016. FINDINGS: Portable AP semi upright view at 0438 hours. There is a small volume of residual barium from the swallow study yesterday now projecting in the epigastrium and also along the midline of the chest, and the latter might be within the esophagus or trachea. Continued near complete white out of the right lung, only trace gas visible in the right apex and about the right hilum. Continued confluent retrocardiac opacity on the left compatible with lower lobe collapse or consolidation. No pneumothorax.  No pulmonary edema. Visible mediastinal contours are stable. IMPRESSION: 1. Continued subtotal opacification of the right hemithorax likely representing a combination of large pleural collection and consolidated or drowned right lung in this setting. 2. Stable left lung ventilation with lower lobe collapse or consolidation. 3. Small volume of retained barium from the speech pathology swallow study yesterday. Electronically Signed   By: Genevie Ann M.D.   On: 11/28/2016 06:54   Dg Chest Port 1 View  Result Date: 11/27/2016 CLINICAL DATA:  Pleural effusion EXAM: PORTABLE CHEST 1 VIEW COMPARISON:  11/25/2016 FINDINGS: Complete opacification of the right hemithorax  compatible with right lung collapse and progressive effusion. Progressive left lower lobe airspace disease and small left effusion compared with the prior study. Negative for heart failure. IMPRESSION: There is now complete opacification of the right hemithorax compatible with enlarging right effusion and right lung collapse Progression of left lower lobe atelectasis/ infiltrate and small left effusion. Electronically Signed   By: Franchot Gallo M.D.   On: 11/27/2016 06:47   Dg Swallowing Func-speech Pathology  Result Date: 11/27/2016 Objective Swallowing Evaluation: Type of Study: MBS-Modified Barium Swallow Study Patient Details Name: Bruce [name] MRN: [id number] Date of Birth: 1961-09-28 Today's Date: 11/27/2016 Time: SLP Start Time (ACUTE ONLY): 1320-SLP Stop Time (ACUTE ONLY): 1340 SLP Time Calculation (min) (ACUTE ONLY): 20 min Past Medical History: Past Medical History: Diagnosis Date . 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 Dysphasia   solid and liquid . Esophagitis  . Lung cancer ([place]) 12/27/13  right upper lung . Mass of esophagus 05/28/2012  BX'D DISTAL ESOPHAGUS,PENDING . Neuropathy ([place])   right hand numbness 1 year 2012 Past Surgical History: Past Surgical History: Procedure Laterality Date . COMPLETE ESOPHAGECTOMY  08/26/2012  Procedure: ESOPHAGECTOMY COMPLETE;  Surgeon: [name], MD;  [facility] OR;  Service: Thoracic;  Laterality: N/A;  transhiatal  . 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 EUS  06/11/2012  Procedure: UPPER ENDOSCOPIC ULTRASOUND (EUS) RADIAL;  Surgeon: Milus Banister, MD;  Location: [facility] ENDOSCOPY;  Service: Endoscopy;  Laterality: N/A; . JEJUNOSTOMY  08/26/2012  Procedure: JEJUNOSTOMY;  Surgeon: [name], MD;  Location: [place];  Service: Thoracic;  Laterality: N/A;  placement of feeding jujunostomy tube . LYMPH NODE DISSECTION Right 01/31/2014  Procedure: LYMPH  NODE DISSECTION;  Surgeon: [name], MD;  Location: [place];  Service: Thoracic;  Laterality: Right; . PYLOROPLASTY  08/26/2012  Procedure: PYLOROPLASTY;  Surgeon: [name], MD;  Location: [place];  Service: Thoracic;  Laterality: N/A; . VIDEO ASSISTED THORACOSCOPY (VATS)/WEDGE RESECTION Right 01/31/2014  Procedure: VIDEO ASSISTED THORACOSCOPY (VATS)/WEDGE RESECTION; with insertion of On Q pain pump;  Surgeon: [name], MD;  Location: [place];  Service: Thoracic;  Laterality: Right;  with insertion of On Q pain pump . VIDEO BRONCHOSCOPY  08/26/2012  Procedure: VIDEO BRONCHOSCOPY;  Surgeon: [name], MD;  Location: [place];  Service: Thoracic;  Laterality: N/A; . VIDEO BRONCHOSCOPY N/A 01/31/2014  Procedure: VIDEO BRONCHOSCOPY;  Surgeon: [name], MD;  Location: [facility] OR;  Service: Thoracic;  Laterality: N/A; HPI: 56 year old non-English speaking male admitted 11/19/2016 due to coughing, choking and difficulty breathing.  PMH significant for metastatic esophageal CA (dx 2013), SVC syndrome, tracheal compression, lung mets, esophagectomy (2014). Barium Swallow was completed 02/2016, which revealed flash penetration, no hypopharyngeal stricture, no aspiration.  Subjective: friend present for translation Assessment / Plan /  Recommendation CHL IP CLINICAL IMPRESSIONS 11/27/2016 Therapy Diagnosis Mild oral phase dysphagia;Severe pharyngeal phase dysphagia Clinical Impression Pt presents with mild oral, severe pharyngeal dysphagia, characterized orally by piecemeal deglutition of honey and puree consistencies. Pharyngeally, pt exhibits delayed swallow reflex with trigger at the level of the vallecular sinus across consistencies. Additionally, trace post-swallow vallecular residue is seen across consistencies, which pt is able to clear with cued dry swallow. Of primary concern is pt's gross aspiration of thin and nectar thick liquids with no cough response (SILENT ASPIRATION). Cued cough is weak and  ineffective to remove aspirate. Pt heart rate was noted to become elevated as the study progressed, possibly indicating significance of the effect of aspiration. Pt's friend was present during this study to facilitate communication, and he reported pt feels tired after drinking liquids, likely due to aspiration. At this time, puree diet and honey thick liquid via teaspoon is recommended, with smaller more frequent meals for energy conservation. Meds should be crushed in puree, and no liquids (INCLUDING SOUPS) should be given unless thickened to honey consistency. ST will follow pt acutely for assessment of diet tolerance and education regarding safe swallow precautions.  Impact on safety and function Moderate aspiration risk;Severe aspiration risk   CHL IP TREATMENT RECOMMENDATION 11/27/2016 Treatment Recommendations Therapy as outlined in treatment plan below   Prognosis 11/27/2016 Prognosis for Safe Diet Advancement Guarded CHL IP DIET RECOMMENDATION 11/27/2016 SLP Diet Recommendations Dysphagia 1 (Puree) solids;Honey thick liquids Medication Administration Crushed with puree Compensations Minimize environmental distractions;Slow rate;Small sips/bites;Multiple dry swallows after each bite/sip Postural Changes Remain semi-upright after after feeds/meals (Comment);Seated upright at 90 degrees   CHL IP OTHER RECOMMENDATIONS 11/27/2016 Oral Care Recommendations Oral care QID;Staff/trained caregiver to provide oral care Other Recommendations Order thickener from pharmacy;Have oral suction available   CHL IP FOLLOW UP RECOMMENDATIONS 11/26/2016 Follow up Recommendations F/u ST at next venue   CHL IP FREQUENCY AND DURATION 11/27/2016 Speech Therapy Frequency (ACUTE ONLY) min 2x/week Treatment Duration 2 weeks      CHL IP ORAL PHASE 11/27/2016 Oral Phase Impaired Oral - Honey Teaspoon Piecemeal swallowing Oral - Puree Piecemeal swallowing  CHL IP PHARYNGEAL PHASE 11/27/2016 Pharyngeal Phase Impaired Pharyngeal- Honey Teaspoon  Delayed swallow initiation-vallecula;Reduced tongue base retraction;Pharyngeal residue - valleculae Pharyngeal- Nectar Cup Delayed swallow initiation-vallecula;Reduced airway/laryngeal closure;Penetration/Aspiration before swallow;Reduced tongue base retraction;Pharyngeal residue - valleculae Pharyngeal Material enters airway, passes BELOW cords without attempt by patient to eject out (silent aspiration) Pharyngeal- Thin Teaspoon Delayed swallow initiation-vallecula;Reduced airway/laryngeal closure;Penetration/Aspiration before swallow;Pharyngeal residue - valleculae;Reduced tongue base retraction Pharyngeal Material enters airway, passes BELOW cords without attempt by patient to eject out (silent aspiration) Pharyngeal- Puree Delayed swallow initiation-vallecula;Reduced tongue base retraction;Pharyngeal residue - valleculae  CHL IP CERVICAL ESOPHAGEAL PHASE 11/27/2016 Cervical Esophageal Phase [facility], [facility], [place] Shonna Chock 11/27/2016, 2:15 PM      Studies: CT angio chest 1/07 >> mediastinal mass with narrowing of Rt PA and SVC, large B/L effusions increased on Lt and loculated on Rt, RLL consolidation, multiple nodules Lt thoracentesis 1/08 >> 910 ml yellow fluid, glucose 111, protein 3.4, LDH 195, WBC 915 (81%L)  Antibiotics: Vancomycin 1/06 >> 1/06 Zosyn 1/06 >> 1/06 Rocephin 1/07 >> Zithromax 1/07 >>  Cultures: Respiratory viral panel 1/07 >> rhinovirus Urine 1/07 >> negative Blood 1/07 >>  Lt pleural fluid 1/08 >>   Lines/tubes:  Events: 1/06 Admit 1/08 Oncology consulted 1/09 Started on pressors, DNR established 1/11 No further vasopressors   Summary: 56 yo male with metastatic  esophageal cancer complicated by mediastinal mass with SVC syndrome admitted with acute hypoxic respiratory failure from HCAP and b/l pleural effusions.  Had progressive hypotension from sepsis and started on pressors 11/26/16. 1/10 complete opacification rt lung - concern for  loculated effusion on [place] 1/10, +/- additional mucus plugging / aspiration.   Assessment/plan:  Sepsis from pneumonia. procalcitonin 0.11, lactic acid <2, cortisol normal - No further vasopressors - SDU monitoring of hemodynamics - Check pressure in leg due to do bilateral SVC syndrome.   Pneumonia with rhinovirus positive. - antibiotics per primary team - chest PT, mobilize as able  - trend CXR  - IS  Hyponatremia - follow BMP - gentle NS  Metastatic esophageal cancer with mediastinal mass and SVC syndrome. - XRT per radiation oncology - pt/family would like to continue palliative chemotherapy if possible for initial planned 10 days.   - continue diet per SLP  B/l pleural effusions >> loculated on Rt. 1/10 Complete opacification of rt lung. [place] 1/10 confirms no fluid for thora. - f/u Lt pleural fluid cx and cytology from thoracentesis 1/08 - continue supportive measures, comfort as first priority with aggressive medical care as he tolerates  Acute hypoxic respiratory failure. 1/10 complete opacification of rt lung - oxygen to keep SpO2 > 92%  Possible aspiration. - SLP following, appreciate recommendations  Goals of care. - DNR/DNI.  No further vasopressors.   - 1/11 Discussed with primary MD, RN and family regarding goals of care.  It is very important to the patient that he attempt to complete the initial planned 10 days of therapy.  We reviewed progressive nature of CXR, illness, aggressive medical therapies in addition to palliative / comfort measures.  Family is in agreement to continue current planned chemo / XRT, medical care and palliative measures.  We reviewed vasopressors and recommendation for not restarting.  Family in agreement.  DNR, DNI & no vasopressors.    PCCM will be available PRN. Please call back as needed.  Noe Gens, NP-C [facility] Pgr: [phone] or if no answer [phone] 11/28/2016, 8:56 AM

## 2016-11-29 ENCOUNTER — Ambulatory Visit: Payer: BLUE CROSS/BLUE SHIELD | Admitting: Nurse Practitioner

## 2016-11-29 ENCOUNTER — Other Ambulatory Visit: Payer: BLUE CROSS/BLUE SHIELD

## 2016-11-29 ENCOUNTER — Ambulatory Visit: Payer: BLUE CROSS/BLUE SHIELD

## 2016-11-29 ENCOUNTER — Ambulatory Visit
Admission: RE | Admit: 2016-11-29 | Discharge: 2016-11-29 | Disposition: A | Discharge status: Home / Self Care | Payer: BLUE CROSS/BLUE SHIELD | Source: Ambulatory Visit | Attending: Radiation Oncology | Admitting: Radiation Oncology

## 2016-11-29 DIAGNOSIS — R609 Edema, unspecified: Secondary | ICD-10-CM

## 2016-11-29 DIAGNOSIS — C781 Secondary malignant neoplasm of mediastinum: Secondary | ICD-10-CM

## 2016-11-29 LAB — CBC
HCT: 23.7 % — ABNORMAL LOW (ref 39.0–52.0)
Hemoglobin: 8.1 g/dL — ABNORMAL LOW (ref 13.0–17.0)
MCH: 27.7 pg (ref 26.0–34.0)
MCHC: 34.2 g/dL (ref 30.0–36.0)
MCV: 81.2 fL (ref 78.0–100.0)
PLATELETS: 165 10*3/uL (ref 150–400)
RBC: 2.92 MIL/uL — AB (ref 4.22–5.81)
RDW: 13.8 % (ref 11.5–15.5)
WBC: 6.2 10*3/uL (ref 4.0–10.5)

## 2016-11-29 LAB — BASIC METABOLIC PANEL
Anion gap: 6 (ref 5–15)
BUN: 16 mg/dL (ref 6–20)
CO2: 31 mmol/L (ref 22–32)
CREATININE: 0.39 mg/dL — AB (ref 0.61–1.24)
Calcium: 9 mg/dL (ref 8.9–10.3)
Chloride: 94 mmol/L — ABNORMAL LOW (ref 101–111)
Glucose, Bld: 90 mg/dL (ref 65–99)
Potassium: 3.7 mmol/L (ref 3.5–5.1)
SODIUM: 131 mmol/L — AB (ref 135–145)

## 2016-11-29 LAB — MAGNESIUM: MAGNESIUM: 2 mg/dL (ref 1.7–2.4)

## 2016-11-29 MED ORDER — SODIUM CHLORIDE 0.9% FLUSH
3.0000 mL | INTRAVENOUS | Status: DC | PRN
Start: 2016-11-29 — End: 2016-12-03

## 2016-11-29 MED ORDER — SODIUM CHLORIDE 0.9% FLUSH
10.0000 mL | INTRAVENOUS | Status: DC | PRN
Start: 2016-11-29 — End: 2016-12-03

## 2016-11-29 MED ORDER — ATROPINE SULFATE 1 MG/ML IJ SOLN
0.5000 mg | Freq: Once | INTRAMUSCULAR | Status: DC | PRN
Start: 2016-11-29 — End: 2016-12-03
  Filled 2016-11-29: qty 0.5

## 2016-11-29 MED ORDER — PALONOSETRON HCL INJECTION 0.25 MG/5ML
0.2500 mg | Freq: Once | INTRAVENOUS | Status: AC
  Administered 2016-11-29: 0.25 mg via INTRAVENOUS
  Filled 2016-11-29: qty 5

## 2016-11-29 MED ORDER — HEPARIN SOD (PORK) LOCK FLUSH 100 UNIT/ML IV SOLN
500.0000 [IU] | Freq: Once | INTRAVENOUS | Status: DC | PRN
  Filled 2016-11-29: qty 5

## 2016-11-29 MED ORDER — IRINOTECAN HCL CHEMO INJECTION 100 MG/5ML
65.0000 mg/m2 | Freq: Once | INTRAVENOUS | Status: AC
  Administered 2016-11-29: 80 mg via INTRAVENOUS
  Filled 2016-11-29 (×2): qty 4

## 2016-11-29 MED ORDER — IPRATROPIUM-ALBUTEROL 0.5-2.5 (3) MG/3ML IN SOLN
3.0000 mL | Freq: Four times a day (QID) | RESPIRATORY_TRACT | Status: DC
  Administered 2016-11-29: 3 mL via RESPIRATORY_TRACT
  Filled 2016-11-29: qty 3

## 2016-11-29 MED ORDER — SODIUM CHLORIDE 0.9 % IV SOLN
30.0000 mg/m2 | Freq: Once | INTRAVENOUS | Status: AC
  Administered 2016-11-29: 41 mg via INTRAVENOUS
  Filled 2016-11-29: qty 41

## 2016-11-29 MED ORDER — SODIUM CHLORIDE 0.9 % IV SOLN
Freq: Once | INTRAVENOUS | Status: AC
  Administered 2016-11-29: 16:00:00 via INTRAVENOUS
  Filled 2016-11-29: qty 5

## 2016-11-29 MED ORDER — SODIUM CHLORIDE 0.9 % IV SOLN
Freq: Once | INTRAVENOUS | Status: DC
Start: 2016-11-29 — End: 2016-12-03

## 2016-11-29 MED ORDER — HEPARIN SOD (PORK) LOCK FLUSH 100 UNIT/ML IV SOLN
250.0000 [IU] | Freq: Once | INTRAVENOUS | Status: DC | PRN
  Filled 2016-11-29: qty 3

## 2016-11-29 MED ORDER — POTASSIUM CHLORIDE 2 MEQ/ML IV SOLN
Freq: Once | INTRAVENOUS | Status: AC
  Administered 2016-11-29: 13:00:00 via INTRAVENOUS
  Filled 2016-11-29: qty 10

## 2016-11-29 MED ORDER — ALTEPLASE 2 MG IJ SOLR
2.0000 mg | Freq: Once | INTRAMUSCULAR | Status: DC | PRN
  Filled 2016-11-29: qty 2

## 2016-11-29 NOTE — Progress Notes (Signed)
Chemotherapy consent signed for Cisplatin and Irinotecan by daughter.

## 2016-11-29 NOTE — Progress Notes (Signed)
PROGRESS NOTE  Bruce [name]  BJS:[id number] DOB: 1961-02-01 DOA: 12/08/2016 PCP: No PCP Per Patient  Outpatient Specialists: Oncology, Dr. Benay Spice  Brief Narrative: Bruce [name] is a 56 y.o. male with a history of metastatic esophageal cancer complicated by loculated R pleural effusion, SVC syndrome and tracheal compression on palliative chemo and XRT who presented with sudden coughing and dyspnea. This was the day after routine irinotecan/cisplatin infusion. EMS was called and on arrival in ED he was tachycardic into 150's and hypoxemic requiring 5L. Lactate was 6.7, WBC 14.3, Na 126. CXR showed large right effusion, and left effusion, tracheal narrowing. CT PE protocol showed no PE but did show large loculated right effusion, moderate left effusion, known severe SVC and brachiocephalic narrowing, new severe pulmonary artery narrowing and progressive consolidation in right lung as well as irregular narrowing of right mainstem bronchus. He was given 30 cc/kg bolus, cultures were obtained, and vanc and Zosyn were administered. He was admitted for HCAP. He had therapeutic thoracentesis 1/8 with 900cc fluid return, and required pressor support for hypotension 1/9 and was transferred to [facility] service. Thus far hypotension has marginally improved and pressors have been weaned off as of 1/10. Hypotension was thought to be artifactual due to UE edema. Viral panel grew rhinovirus. Palliative chest XRT had been initiated 11/19/16 and was continued as a trial per oncology to assess response. Plan is to continue this while treating in SDU and reassess response and discharge with home hospice if unsuccessful.   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 Possibly contribution by aspiration PNA and rhinovirus.  - Abx as below - Appreciate pulmonology recommendations - Oxygen by nasal cannula to maintain SpO2 >92%  Sepsis with hypotension, suspect from pneumonia vs. SIRS from advanced malignancy: With evidence of aspiration, will cover pulmonary/anaerobes. Rhinovirus positive. PCT < 0.10. LA normalized, leukocytosis resolved. - Would NOT want to be put back on pressors and confirmed NO CODE BLUE. Cortisol wnl, no indication for steroids. - Monitor BP on lower extremities.  - Continue zosyn for suspected ongoing aspiration. MRSA swab negative. After discussion with family, I believe allowing him to eat is indicated for ongoing nutrition and as a comfort measure. - Diet and aspiration precautions per SLP  Bilateral Large Pleural Effusion: Reported subjective improvement in dyspnea following thoracentesis 1/8, though CXR appears to be worsening. Complete opacification of right lung. Bedside U/S 1/10 confirmed right effusion not amenable to thoracentesis, not felt to be bronch candidate, certainly not a VATS candidate at this time.  - Monitor  Metastatic esophageal cancer:Discussed with Dr. Benay Spice. Plan is to continue XRT daily to measure response in a few days and refer for home vs. residential hospice if no improvement.  - Continue morphine SL or IV prn pain, dyspnea - Continue daily radiation, Dr. Benay Spice to perform chemo 1/12.   Hyponatremia: Chronic.  - Monitor BMP, continue NS '@50cc'$ /hr  Normocytic anemia: -Suspect from chronic  disease/cancer -Currently stable  DVT prophylaxis: Lovenox Code Status:  DNR, no pressors Family Communication: Daughter at bedside who acts as interpretor due to patient again refusing telephone interpretation services. Disposition Plan: Clinical trajectory is marginally improved, certainly not showing signs of decompensation. Confirmed that pt would not want resuscitation including pressors were his blood pressure not to respond to fluids. Will transfer to Onc floor for chemotherapy today.   Consultants:   Pulmonology, Dr. Halford Chessman  Oncology, Dr. Benay Spice  Radiation oncology, Dr. Lisbeth Renshaw  Procedures:  CT angio chest 1/07 >> mediastinal mass with narrowing of Rt PA and SVC, large B/L effusions increased on Lt and loculated on Rt, RLL consolidation, multiple nodules Lt thoracentesis 1/08 >> 910 ml yellow fluid, glucose 111, protein 3.4, LDH 195, WBC 915 (81%L)  Antimicrobials: Vancomycin 1/06 >> 1/06 Zosyn 1/06 >> 1/06 Rocephin 1/07 >> 1/11 Zithromax 1/07 >> 1/11 Zosyn 1/11 >>   Subjective: No events overnight, reports improving dyspnea. Eating better - some rice soup from home. Better able to cough up the congestion in the chest. CPT helps. No chest pain or palpitations.   Objective: Vitals:   11/29/16 0500 11/29/16 0600 11/29/16 0700 11/29/16 0800  BP: 105/61 (!) 103/59 (!) 97/48 111/74  Pulse: (!) 117 (!) 118 (!) 119 (!) 135  Resp: 16 (!) 21 19 (!) 23  Temp:    97.2 F (36.2 C)  TempSrc:    Axillary  SpO2: 100% 100% 100% 98%  Weight:    46 kg (101 lb 6.6 oz)  Height:        Intake/Output Summary (Last 24 hours) at 11/29/16 1018 Last data filed at 11/29/16 0815  Gross per 24 hour  Intake             1250 ml  Output             1025 ml  Net              225 ml   Filed Weights   11/24/16 0500 11/27/16 0330 11/29/16 0800  Weight: 43.3 kg (95 lb 7.4 oz) 44.7 kg (98 lb 8.7 oz) 46 kg (101 lb 6.6 oz)    Examination: General exam: 56 y.o. male in apparent discomfort but no respiratory distress Respiratory system: Tachypneic without retractions  with supplemental oxygen. Absent R side breath sounds and diminished with crackles at left at mid and lower lung zones. Cardiovascular system: Regular rate and rhythm. No murmur, rub, or gallop. + JVD, and no pedal edema. Facial swelling and plethora. UE's markedly edematous.  Gastrointestinal system: Abdomen soft, non-tender, non-distended, with normoactive bowel sounds. No organomegaly or masses felt. Central nervous system: Alert and oriented. No focal neurological deficits. Extremities: Warm, no deformities Skin: No rashes, lesions no ulcers Psychiatry: Judgement and insight appear normal. Mood & affect appropriate.   Data Reviewed: I have personally reviewed following labs and imaging studies  CBC:  Recent Labs Lab 11/24/16 0110  11/25/16 0342 11/26/16 0412 11/27/16 0402 11/28/16 0325 11/29/16 0349  WBC 14.3*  < > 13.8* 12.3* 9.5 7.1 6.2  NEUTROABS 13.9*  --   --   --   --  7.2  --   HGB 10.6*  < > 9.3* 9.8* 8.9* 8.6* 8.1*  HCT 31.3*  < > 27.7* 29.1* 26.5* 25.6* 23.7*  MCV 80.3  < > 80.5 81.5 80.8 79.5 81.2  PLT 262  < > 244 271 218 185 165  < > = values in this interval not displayed. Basic Metabolic  Panel:  Recent Labs Lab 11/25/16 0342 11/26/16 0412 11/27/16 0402 11/28/16 0325 11/29/16 0349  NA 126* 126* 128* 126* 131*  K 4.1 3.7 4.0 3.6 3.7  CL 93* 90* 93* 92* 94*  CO2 '26 30 29 27 31  '$ GLUCOSE 99 100* 116* 100* 90  BUN 21* '18 15 17 16  '$ CREATININE 0.55* 0.51* 0.55* 0.45* 0.39*  CALCIUM 9.2 8.9 8.9 8.9 9.0  MG  --   --   --   --  2.0   GFR: Estimated Creatinine Clearance: 68.7 mL/min (by C-G formula based on SCr of 0.39 mg/dL (L)). Liver Function Tests:  Recent Labs Lab 11/27/16 0402  AST 39  ALT 56  ALKPHOS 166*  BILITOT 0.6  PROT 6.4*  ALBUMIN 2.7*   No results for input(s): LIPASE, AMYLASE in the last 168 hours. No results for input(s): AMMONIA in the last 168 hours. Coagulation Profile: No results for input(s): INR, PROTIME in the last 168  hours. Cardiac Enzymes: No results for input(s): CKTOTAL, CKMB, CKMBINDEX, TROPONINI in the last 168 hours. BNP (last 3 results) No results for input(s): PROBNP in the last 8760 hours. HbA1C: No results for input(s): HGBA1C in the last 72 hours. CBG: No results for input(s): GLUCAP in the last 168 hours. Lipid Profile: No results for input(s): CHOL, HDL, LDLCALC, TRIG, CHOLHDL, LDLDIRECT in the last 72 hours. Thyroid Function Tests: No results for input(s): TSH, T4TOTAL, FREET4, T3FREE, THYROIDAB in the last 72 hours. Anemia Panel: No results for input(s): VITAMINB12, FOLATE, FERRITIN, TIBC, IRON, RETICCTPCT in the last 72 hours. Urine analysis:    Component Value Date/Time   COLORURINE YELLOW 11/24/2016 [place] 11/24/2016 0333   LABSPEC 1.020 11/24/2016 0333   LABSPEC 1.005 07/29/2014 1543   PHURINE 5.0 11/24/2016 0333   GLUCOSEU 100 (A) 11/24/2016 0333   GLUCOSEU Negative 07/29/2014 1543   HGBUR SMALL (A) 11/24/2016 0333   BILIRUBINUR NEGATIVE 11/24/2016 0333   BILIRUBINUR Negative 07/29/2014 1543   KETONESUR NEGATIVE 11/24/2016 0333   PROTEINUR NEGATIVE 11/24/2016 0333   UROBILINOGEN 0.2 07/29/2014 1543   NITRITE NEGATIVE 11/24/2016 0333   LEUKOCYTESUR NEGATIVE 11/24/2016 0333   LEUKOCYTESUR Negative 07/29/2014 1543   Sepsis Labs: '@LABRCNTIP'$ (procalcitonin:4,lacticidven:4)  ) Recent Results (from the past 240 hour(s))  Respiratory Panel by PCR     Status: Abnormal   Collection Time: 11/24/16  1:30 AM  Result Value Ref Range Status   Adenovirus NOT DETECTED NOT DETECTED Final   Coronavirus 229E NOT DETECTED NOT DETECTED Final   Coronavirus HKU1 NOT DETECTED NOT DETECTED Final   Coronavirus NL63 NOT DETECTED NOT DETECTED Final   Coronavirus OC43 NOT DETECTED NOT DETECTED Final   Metapneumovirus NOT DETECTED NOT DETECTED Final   Rhinovirus / Enterovirus DETECTED (A) NOT DETECTED Final   Influenza A NOT DETECTED NOT DETECTED Final   Influenza B NOT  DETECTED NOT DETECTED Final   Parainfluenza Virus 1 NOT DETECTED NOT DETECTED Final   Parainfluenza Virus 2 NOT DETECTED NOT DETECTED Final   Parainfluenza Virus 3 NOT DETECTED NOT DETECTED Final   Parainfluenza Virus 4 NOT DETECTED NOT DETECTED Final   Respiratory Syncytial Virus NOT DETECTED NOT DETECTED Final   Bordetella pertussis NOT DETECTED NOT DETECTED Final   Chlamydophila pneumoniae NOT DETECTED NOT DETECTED Final   Mycoplasma pneumoniae NOT DETECTED NOT DETECTED Final    Comment: Performed at [facility]  Blood Culture (routine x 2)     Status: None (Preliminary result)   Collection Time:  11/24/16  2:10 AM  Result Value Ref Range Status   Specimen Description BLOOD LEFT HAND  Final   Special Requests BOTTLES DRAWN AEROBIC AND ANAEROBIC 5ML  Final   Culture   Final    NO GROWTH 4 DAYS Performed at [facility]    Report Status PENDING  Incomplete  Blood Culture (routine x 2)     Status: None (Preliminary result)   Collection Time: 11/24/16  2:12 AM  Result Value Ref Range Status   Specimen Description BLOOD LEFT HAND  Final   Special Requests BOTTLES DRAWN AEROBIC AND ANAEROBIC 5ML  Final   Culture   Final    NO GROWTH 4 DAYS Performed at [facility]    Report Status PENDING  Incomplete  Urine culture     Status: None   Collection Time: 11/24/16  3:33 AM  Result Value Ref Range Status   Specimen Description URINE, CLEAN CATCH  Final   Special Requests NONE  Final   Culture NO GROWTH Performed at [facility]   Final   Report Status 11/25/2016 FINAL  Final  MRSA PCR Screening     Status: None   Collection Time: 11/24/16  5:41 AM  Result Value Ref Range Status   MRSA by PCR NEGATIVE NEGATIVE Final    Comment:        The GeneXpert MRSA Assay (FDA approved for NASAL specimens only), is one component of a comprehensive MRSA colonization surveillance program. It is not intended to diagnose MRSA infection nor to guide or monitor  treatment for MRSA infections.   Culture, body fluid-bottle     Status: None (Preliminary result)   Collection Time: 11/25/16 10:27 AM  Result Value Ref Range Status   Specimen Description PLEURAL  Final   Special Requests NONE  Final   Culture   Final    NO GROWTH 3 DAYS Performed at [facility]    Report Status PENDING  Incomplete  Gram stain     Status: None   Collection Time: 11/25/16 10:27 AM  Result Value Ref Range Status   Specimen Description PLEURAL  Final   Special Requests NONE  Final   Gram Stain   Final    FEW WBC PRESENT, PREDOMINANTLY MONONUCLEAR NO ORGANISMS SEEN Performed at [facility]    Report Status 11/25/2016 FINAL  Final     Radiology Studies: Dg Chest Port 1 View  Result Date: 11/28/2016 CLINICAL DATA:  56 year old male with respiratory failure, sepsis, esophageal cancer with lung metastases. Initial encounter. EXAM: PORTABLE CHEST 1 VIEW COMPARISON:  11/27/2016 portable chest and earlier. Speech pathology swallow study 11/27/2016. FINDINGS: Portable AP semi upright view at 0438 hours. There is a small volume of residual barium from the swallow study yesterday now projecting in the epigastrium and also along the midline of the chest, and the latter might be within the esophagus or trachea. Continued near complete white out of the right lung, only trace gas visible in the right apex and about the right hilum. Continued confluent retrocardiac opacity on the left compatible with lower lobe collapse or consolidation. No pneumothorax. No pulmonary edema. Visible mediastinal contours are stable. IMPRESSION: 1. Continued subtotal opacification of the right hemithorax likely representing a combination of large pleural collection and consolidated or drowned right lung in this setting. 2. Stable left lung ventilation with lower lobe collapse or consolidation. 3. Small volume of retained barium from the speech pathology swallow study yesterday. Electronically  Signed  By: Genevie Ann M.D.   On: 11/28/2016 06:54   Dg Swallowing Func-speech Pathology  Result Date: 11/27/2016 Objective Swallowing Evaluation: Type of Study: MBS-Modified Barium Swallow Study Patient Details Name: Bruce [name] MRN: [id number] Date of Birth: July 31, 1961 Today's Date: 11/27/2016 Time: SLP Start Time (ACUTE ONLY): 1320-SLP Stop Time (ACUTE ONLY): 1340 SLP Time Calculation (min) (ACUTE ONLY): 20 min Past Medical History: Past Medical History: Diagnosis Date . Cancer ([place]) 05/28/12  bx=esophagus=squamous cell carcinoma . Dysphasia   solid and liquid . Esophagitis  . Lung cancer ([place]) 12/27/13  right upper lung . Mass of esophagus 05/28/2012  BX'D DISTAL ESOPHAGUS,PENDING . Neuropathy ([place])   right hand numbness 1 year 2012 Past Surgical History: Past Surgical History: Procedure Laterality Date . COMPLETE ESOPHAGECTOMY  08/26/2012  Procedure: ESOPHAGECTOMY COMPLETE;  Surgeon: [name], MD;  Location: [facility] OR;  Service: Thoracic;  Laterality: N/A;  transhiatal  . 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 EUS  06/11/2012  Procedure: UPPER ENDOSCOPIC ULTRASOUND (EUS) RADIAL;  Surgeon: Milus Banister, MD;  Location: [facility] ENDOSCOPY;  Service: Endoscopy;  Laterality: N/A; . JEJUNOSTOMY  08/26/2012  Procedure: JEJUNOSTOMY;  Surgeon: [name], MD;  Location: [place];  Service: Thoracic;  Laterality: N/A;  placement of feeding jujunostomy tube . LYMPH NODE DISSECTION Right 01/31/2014  Procedure: LYMPH NODE DISSECTION;  Surgeon: [name], MD;  Location: [place];  Service: Thoracic;  Laterality: Right; . PYLOROPLASTY  08/26/2012  Procedure: PYLOROPLASTY;  Surgeon: [name], MD;  Location: [place];  Service: Thoracic;  Laterality: N/A; . VIDEO ASSISTED THORACOSCOPY (VATS)/WEDGE RESECTION Right 01/31/2014  Procedure: VIDEO ASSISTED THORACOSCOPY (VATS)/WEDGE RESECTION; with insertion of On Q pain pump;  Surgeon:  [name], MD;  Location: [place];  Service: Thoracic;  Laterality: Right;  with insertion of On Q pain pump . VIDEO BRONCHOSCOPY  08/26/2012  Procedure: VIDEO BRONCHOSCOPY;  Surgeon: [name], MD;  Location: [place];  Service: Thoracic;  Laterality: N/A; . VIDEO BRONCHOSCOPY N/A 01/31/2014  Procedure: VIDEO BRONCHOSCOPY;  Surgeon: [name], MD;  Location: [facility] OR;  Service: Thoracic;  Laterality: N/A; HPI: 56 year old non-English speaking male admitted 12/11/2016 due to coughing, choking and difficulty breathing.  PMH significant for metastatic esophageal CA (dx 2013), SVC syndrome, tracheal compression, lung mets, esophagectomy (2014). Barium Swallow was completed 02/2016, which revealed flash penetration, no hypopharyngeal stricture, no aspiration.  Subjective: friend present for translation Assessment / Plan / Recommendation CHL IP CLINICAL IMPRESSIONS 11/27/2016 Therapy Diagnosis Mild oral phase dysphagia;Severe pharyngeal phase dysphagia Clinical Impression Pt presents with mild oral, severe pharyngeal dysphagia, characterized orally by piecemeal deglutition of honey and puree consistencies. Pharyngeally, pt exhibits delayed swallow reflex with trigger at the level of the vallecular sinus across consistencies. Additionally, trace post-swallow vallecular residue is seen across consistencies, which pt is able to clear with cued dry swallow. Of primary concern is pt's gross aspiration of thin and nectar thick liquids with no cough response (SILENT ASPIRATION). Cued cough is weak and ineffective to remove aspirate. Pt heart rate was noted to become elevated as the study progressed, possibly indicating significance of the effect of aspiration. Pt's friend was present during this study to facilitate communication, and he reported pt feels tired after drinking liquids, likely due to aspiration. At this time, puree diet and honey thick liquid via teaspoon is recommended, with smaller more frequent meals  for energy conservation. Meds  should be crushed in puree, and no liquids (INCLUDING SOUPS) should be given unless thickened to honey consistency. ST will follow pt acutely for assessment of diet tolerance and education regarding safe swallow precautions.  Impact on safety and function Moderate aspiration risk;Severe aspiration risk   CHL IP TREATMENT RECOMMENDATION 11/27/2016 Treatment Recommendations Therapy as outlined in treatment plan below   Prognosis 11/27/2016 Prognosis for Safe Diet Advancement Guarded CHL IP DIET RECOMMENDATION 11/27/2016 SLP Diet Recommendations Dysphagia 1 (Puree) solids;Honey thick liquids Medication Administration Crushed with puree Compensations Minimize environmental distractions;Slow rate;Small sips/bites;Multiple dry swallows after each bite/sip Postural Changes Remain semi-upright after after feeds/meals (Comment);Seated upright at 90 degrees   CHL IP OTHER RECOMMENDATIONS 11/27/2016 Oral Care Recommendations Oral care QID;Staff/trained caregiver to provide oral care Other Recommendations Order thickener from pharmacy;Have oral suction available   CHL IP FOLLOW UP RECOMMENDATIONS 11/26/2016 Follow up Recommendations F/u ST at next venue   CHL IP FREQUENCY AND DURATION 11/27/2016 Speech Therapy Frequency (ACUTE ONLY) min 2x/week Treatment Duration 2 weeks      CHL IP ORAL PHASE 11/27/2016 Oral Phase Impaired Oral - Honey Teaspoon Piecemeal swallowing Oral - Puree Piecemeal swallowing  CHL IP PHARYNGEAL PHASE 11/27/2016 Pharyngeal Phase Impaired Pharyngeal- Honey Teaspoon Delayed swallow initiation-vallecula;Reduced tongue base retraction;Pharyngeal residue - valleculae Pharyngeal- Nectar Cup Delayed swallow initiation-vallecula;Reduced airway/laryngeal closure;Penetration/Aspiration before swallow;Reduced tongue base retraction;Pharyngeal residue - valleculae Pharyngeal Material enters airway, passes BELOW cords without attempt by patient to eject out (silent aspiration) Pharyngeal- Thin  Teaspoon Delayed swallow initiation-vallecula;Reduced airway/laryngeal closure;Penetration/Aspiration before swallow;Pharyngeal residue - valleculae;Reduced tongue base retraction Pharyngeal Material enters airway, passes BELOW cords without attempt by patient to eject out (silent aspiration) Pharyngeal- Puree Delayed swallow initiation-vallecula;Reduced tongue base retraction;Pharyngeal residue - valleculae  CHL IP CERVICAL ESOPHAGEAL PHASE 11/27/2016 Cervical Esophageal Phase [facility] Celia B. Tira, [facility], CCC-SLP [phone] Shonna Chock 11/27/2016, 2:15 PM     Scheduled Meds: . sodium chloride   Intravenous Once  . chlorhexidine  15 mL Mouth Rinse BID  . CISplatin  30 mg/m2 (Treatment Plan Recorded) Intravenous Once  . D5-1/2NS + KCL + MG +/- MANNITOL IV CISplatin hydration   Intravenous Once  . enoxaparin (LOVENOX) injection  30 mg Subcutaneous Daily  . feeding supplement  1 Container Oral BID BM  . feeding supplement (ENSURE ENLIVE)  237 mL Oral BID BM  . fosaprepitant (EMEND) 150 mg + dexamethasone IV infusion   Intravenous Once  . guaiFENesin  600 mg Oral BID  . irinotecan (CAMPTOSAR) CHEMO IV infusion  65 mg/m2 (Treatment Plan Recorded) Intravenous Once  . mouth rinse  15 mL Mouth Rinse q12n4p  . multivitamin with minerals  1 tablet Oral Daily  . palonosetron  0.25 mg Intravenous Once  . piperacillin-tazobactam (ZOSYN)  IV  3.375 g Intravenous Q8H  . sodium chloride flush  3 mL Intravenous Q12H   Continuous Infusions: . sodium chloride 50 mL/hr at 11/29/16 0800     LOS: 5 days   Time spent: 25 minutes.  Vance Gather, MD [facility] Pager [phone]  If 7PM-7AM, please contact night-coverage [email or web address] Password [facility] 11/29/2016, 10:18 AM

## 2016-11-29 NOTE — Progress Notes (Signed)
IP PROGRESS NOTE  Subjective:   His daughter is at the bedside. He feels better this morning. The cough is improved. He continues to fatigue easily with movement.  Objective: Vital signs in last 24 hours: Blood pressure 137/79, pulse (!) 125, temperature 97.6 F (36.4 C), temperature source Axillary, resp. rate (!) 27, height '5\' 3"'$  (1.6 m), weight 101 lb 6.6 oz (46 kg), SpO2 100 %.  Intake/Output from previous day: 01/11 0701 - 01/12 0700 In: 1607 [P.O.:237; I.V.:1200; IV Piggyback:200] Out: 725 [Urine:725]  Physical Exam:  HEENT:  the face and neck are swollen, no thrush, firm nodal mass in the left lower neck. Lungs:  decreased breath sounds over the right chest, increased respiratory rate Cardiac:  regular rate and rhythm Abdomen:  no hepatomegaly, nontender Extremities:  no leg edema, 1+ edema in both arms   Lab Results:  Recent Labs  11/28/16 0325 11/29/16 0349  WBC 7.1 6.2  HGB 8.6* 8.1*  HCT 25.6* 23.7*  PLT 185 165  ANC on 11/28/2016-7.2  BMET  Recent Labs  11/28/16 0325 11/29/16 0349  NA 126* 131*  K 3.6 3.7  CL 92* 94*  CO2 27 31  GLUCOSE 100* 90  BUN 17 16  CREATININE 0.45* 0.39*  CALCIUM 8.9 9.0    Studies/Results: Dg Chest Port 1 View  Result Date: 11/28/2016 CLINICAL DATA:  56 year old male with respiratory failure, sepsis, esophageal cancer with lung metastases. Initial encounter. EXAM: PORTABLE CHEST 1 VIEW COMPARISON:  11/27/2016 portable chest and earlier. Speech pathology swallow study 11/27/2016. FINDINGS: Portable AP semi upright view at 0438 hours. There is a small volume of residual barium from the swallow study yesterday now projecting in the epigastrium and also along the midline of the chest, and the latter might be within the esophagus or trachea. Continued near complete white out of the right lung, only trace gas visible in the right apex and about the right hilum. Continued confluent retrocardiac opacity on the left compatible with  lower lobe collapse or consolidation. No pneumothorax. No pulmonary edema. Visible mediastinal contours are stable. IMPRESSION: 1. Continued subtotal opacification of the right hemithorax likely representing a combination of large pleural collection and consolidated or drowned right lung in this setting. 2. Stable left lung ventilation with lower lobe collapse or consolidation. 3. Small volume of retained barium from the speech pathology swallow study yesterday. Electronically Signed   By: Genevie Ann M.D.   On: 11/28/2016 06:54   Dg Swallowing Func-speech Pathology  Result Date: 11/27/2016 Objective Swallowing Evaluation: Type of Study: MBS-Modified Barium Swallow Study Patient Details Name: Yunis Voorheis MRN: [id number] Date of Birth: 07-13-1961 Today's Date: 11/27/2016 Time: SLP Start Time (ACUTE ONLY): 1320-SLP Stop Time (ACUTE ONLY): 1340 SLP Time Calculation (min) (ACUTE ONLY): 20 min Past Medical History: Past Medical History: Diagnosis Date . 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 Dysphasia   solid and liquid . Esophagitis  . Lung cancer ([place]) 12/27/13  right upper lung . Mass of esophagus 05/28/2012  BX'D DISTAL ESOPHAGUS,PENDING . Neuropathy ([place])   right hand numbness 1 year 2012 Past Surgical History: Past Surgical History: Procedure Laterality Date . COMPLETE ESOPHAGECTOMY  08/26/2012  Procedure: ESOPHAGECTOMY COMPLETE;  Surgeon: [name], MD;  Location: [facility] OR;  Service: Thoracic;  Laterality: N/A;  transhiatal  . 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 EUS  06/11/2012  Procedure: UPPER ENDOSCOPIC ULTRASOUND (EUS) RADIAL;  Surgeon: Melene Plan  Ardis Hughs, MD;  Location: [facility];  Service: Endoscopy;  Laterality: N/A; . JEJUNOSTOMY  08/26/2012  Procedure: JEJUNOSTOMY;  Surgeon: [name], MD;  Location: [place];  Service: Thoracic;  Laterality: N/A;  placement of feeding jujunostomy tube .  LYMPH NODE DISSECTION Right 01/31/2014  Procedure: LYMPH NODE DISSECTION;  Surgeon: [name], MD;  Location: [place];  Service: Thoracic;  Laterality: Right; . PYLOROPLASTY  08/26/2012  Procedure: PYLOROPLASTY;  Surgeon: [name], MD;  Location: [place];  Service: Thoracic;  Laterality: N/A; . VIDEO ASSISTED THORACOSCOPY (VATS)/WEDGE RESECTION Right 01/31/2014  Procedure: VIDEO ASSISTED THORACOSCOPY (VATS)/WEDGE RESECTION; with insertion of On Q pain pump;  Surgeon: [name], MD;  Location: [place];  Service: Thoracic;  Laterality: Right;  with insertion of On Q pain pump . VIDEO BRONCHOSCOPY  08/26/2012  Procedure: VIDEO BRONCHOSCOPY;  Surgeon: [name], MD;  Location: [place];  Service: Thoracic;  Laterality: N/A; . VIDEO BRONCHOSCOPY N/A 01/31/2014  Procedure: VIDEO BRONCHOSCOPY;  Surgeon: [name], MD;  Location: [facility] OR;  Service: Thoracic;  Laterality: N/A; HPI: 56 year old non-English speaking male admitted 11/30/2016 due to coughing, choking and difficulty breathing.  PMH significant for metastatic esophageal CA (dx 2013), SVC syndrome, tracheal compression, lung mets, esophagectomy (2014). Barium Swallow was completed 02/2016, which revealed flash penetration, no hypopharyngeal stricture, no aspiration.  Subjective: friend present for translation Assessment / Plan / Recommendation CHL IP CLINICAL IMPRESSIONS 11/27/2016 Therapy Diagnosis Mild oral phase dysphagia;Severe pharyngeal phase dysphagia Clinical Impression Pt presents with mild oral, severe pharyngeal dysphagia, characterized orally by piecemeal deglutition of honey and puree consistencies. Pharyngeally, pt exhibits delayed swallow reflex with trigger at the level of the vallecular sinus across consistencies. Additionally, trace post-swallow vallecular residue is seen across consistencies, which pt is able to clear with cued dry swallow. Of primary concern is pt's gross aspiration of thin and nectar thick liquids with no  cough response (SILENT ASPIRATION). Cued cough is weak and ineffective to remove aspirate. Pt heart rate was noted to become elevated as the study progressed, possibly indicating significance of the effect of aspiration. Pt's friend was present during this study to facilitate communication, and he reported pt feels tired after drinking liquids, likely due to aspiration. At this time, puree diet and honey thick liquid via teaspoon is recommended, with smaller more frequent meals for energy conservation. Meds should be crushed in puree, and no liquids (INCLUDING SOUPS) should be given unless thickened to honey consistency. ST will follow pt acutely for assessment of diet tolerance and education regarding safe swallow precautions.  Impact on safety and function Moderate aspiration risk;Severe aspiration risk   CHL IP TREATMENT RECOMMENDATION 11/27/2016 Treatment Recommendations Therapy as outlined in treatment plan below   Prognosis 11/27/2016 Prognosis for Safe Diet Advancement Guarded CHL IP DIET RECOMMENDATION 11/27/2016 SLP Diet Recommendations Dysphagia 1 (Puree) solids;Honey thick liquids Medication Administration Crushed with puree Compensations Minimize environmental distractions;Slow rate;Small sips/bites;Multiple dry swallows after each bite/sip Postural Changes Remain semi-upright after after feeds/meals (Comment);Seated upright at 90 degrees   CHL IP OTHER RECOMMENDATIONS 11/27/2016 Oral Care Recommendations Oral care QID;Staff/trained caregiver to provide oral care Other Recommendations Order thickener from pharmacy;Have oral suction available   CHL IP FOLLOW UP RECOMMENDATIONS 11/26/2016 Follow up Recommendations F/u ST at next venue   CHL IP FREQUENCY AND DURATION 11/27/2016 Speech Therapy Frequency (ACUTE ONLY) min 2x/week Treatment Duration 2 weeks      CHL IP ORAL PHASE 11/27/2016 Oral Phase Impaired Oral -  Honey Teaspoon Piecemeal swallowing Oral - Puree Piecemeal swallowing  CHL IP PHARYNGEAL PHASE  11/27/2016 Pharyngeal Phase Impaired Pharyngeal- Honey Teaspoon Delayed swallow initiation-vallecula;Reduced tongue base retraction;Pharyngeal residue - valleculae Pharyngeal- Nectar Cup Delayed swallow initiation-vallecula;Reduced airway/laryngeal closure;Penetration/Aspiration before swallow;Reduced tongue base retraction;Pharyngeal residue - valleculae Pharyngeal Material enters airway, passes BELOW cords without attempt by patient to eject out (silent aspiration) Pharyngeal- Thin Teaspoon Delayed swallow initiation-vallecula;Reduced airway/laryngeal closure;Penetration/Aspiration before swallow;Pharyngeal residue - valleculae;Reduced tongue base retraction Pharyngeal Material enters airway, passes BELOW cords without attempt by patient to eject out (silent aspiration) Pharyngeal- Puree Delayed swallow initiation-vallecula;Reduced tongue base retraction;Pharyngeal residue - valleculae  CHL IP CERVICAL ESOPHAGEAL PHASE 11/27/2016 Cervical Esophageal Phase [facility] Celia B. [place], [facility], CCC-SLP [phone] Shonna Chock 11/27/2016, 2:15 PM     Medications: I have reviewed the patient's current medications.  Assessment/Plan:  1.metastatic esophagus cancer-progressive disease in the chest confirmed on CT 11/06/2016    palliative chest radiation and initiated 11/19/2016  [place] weekly irinotecan/cisplatin chemotherapy beginning 11/22/00/2018  2. Admission 11/29/2016 with respiratory failure-likely secondary to tumor progression in the lungs and increased effusions  Palliative left thoracentesis 11/25/2016 3. Chest/back pain secondary to #1 4. Anemia secondary to chronic disease 5. SVC syndrome secondary to #1 6. Hypotension-likely secondary to #5   Mr. Kozakiewicz appears more comfortable this morning he continues to have bilateral arm edema. He would like to proceed with week 2 irinotecan/cisplatin today. He continues palliative chest radiation.  Recommendations: 1. Continue oxygen and morphine for  dyspnea, wean oxygen as tolerated 2. Week #2 irinotecan/cisplatin today 3. Continue palliative radiation to the chest 4. Elevated arms 5. Out of bed as tolerated   Please call Oncology as needed. I will see him 01/01/17. The plan is to refer him to home hospice if his status has not improved in the next week, sooner if his condition declines.  LOS: 5 days   Betsy Coder, MD   11/29/2016, 1:44 PM

## 2016-11-29 NOTE — Progress Notes (Signed)
Department of Radiation Oncology  Phone:  [phone] Fax:        [phone]    INPATIENT   Weekly Treatment Note    Name: Bruce [name] Date: 11/29/2016 MRN: [id number] DOB: [birth year]   Diagnosis:     ICD-9-CM ICD-10-CM   1. 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    No current outpatient prescriptions on file.   Facility-Administered Medications Ordered in Other Encounters  Medication Dose Route Frequency Provider Last Rate Last Dose  . 0.9 %  sodium chloride infusion   Intravenous Continuous Chesley Mires, MD 50 mL/hr at 11/29/16 0800    . acetaminophen (TYLENOL) tablet 650 mg  650 mg Oral Q6H PRN Edwin Dada, MD   650 mg at 11/24/16 1633   Or  . acetaminophen (TYLENOL) suppository 650 mg  650 mg Rectal Q6H PRN Edwin Dada, MD      . albuterol (PROVENTIL) (2.5 MG/3ML) 0.083% nebulizer solution 2.5 mg  2.5 mg Nebulization Q2H PRN Edwin Dada, MD   2.5 mg at 11/28/16 0731  . ALPRAZolam Duanne Moron) tablet 0.5 mg  0.5 mg Oral QHS PRN Donne Hazel, MD   0.5 mg at 11/28/16 2350  . chlorhexidine (PERIDEX) 0.12 % solution 15 mL  15 mL Mouth Rinse BID Donne Hazel, MD   15 mL at 11/28/16 0954  . enoxaparin (LOVENOX) injection 30 mg  30 mg Subcutaneous Daily Donne Hazel, MD   30 mg at 11/28/16 0954  . feeding supplement (BOOST / RESOURCE BREEZE) liquid 1 Container  1 Container Oral BID BM Donne Hazel, MD   1 Container at 11/28/16 [phone]  . feeding supplement (ENSURE ENLIVE) (ENSURE ENLIVE) liquid 237 mL  237 mL Oral BID BM Rigoberto Noel, MD   237 mL at 11/28/16 0956  . guaiFENesin (MUCINEX) 12 hr tablet 600 mg  600 mg Oral BID Ladell Pier, MD   600 mg at 11/28/16 2350  . lip balm (CARMEX) ointment   Topical PRN Donne Hazel, MD      . MEDLINE mouth rinse  15 mL Mouth Rinse q12n4p Donne Hazel, MD    15 mL at 11/27/16 1624  . metoprolol (LOPRESSOR) injection 2.5 mg  2.5 mg Intravenous Q4H PRN Anders Simmonds, MD   2.5 mg at 11/27/16 1812  . morphine 2 MG/ML injection 2-4 mg  2-4 mg Intravenous Q3H PRN Donne Hazel, MD   2 mg at 11/28/16 1309  . morphine CONCENTRATE 10 MG/0.5ML oral solution 5-10 mg  5-10 mg Sublingual Q4H PRN Edwin Dada, MD   5 mg at 11/28/16 1310  . multivitamin with minerals tablet 1 tablet  1 tablet Oral Daily Donne Hazel, MD   1 tablet at 11/28/16 [phone]  . piperacillin-tazobactam (ZOSYN) IVPB 3.375 g  3.375 g Intravenous Q8H Donald Prose Runyon, RPH   3.375 g at 11/29/16 0217  . prochlorperazine (COMPAZINE) tablet 10 mg  10 mg Oral Q6H PRN Edwin Dada, MD      . RESOURCE THICKENUP CLEAR   Oral PRN Chesley Mires, MD      . sodium chloride flush (NS) 0.9 % injection 3 mL  3 mL Intravenous Q12H Edwin Dada, MD   3 mL at 11/27/16 1024     ALLERGIES: Carboplatin   LABORATORY DATA:  Lab  Results  Component Value Date   WBC 6.2 11/29/2016   HGB 8.1 (L) 11/29/2016   HCT 23.7 (L) 11/29/2016   MCV 81.2 11/29/2016   PLT 165 11/29/2016   Lab Results  Component Value Date   NA 131 (L) 11/29/2016   K 3.7 11/29/2016   CL 94 (L) 11/29/2016   CO2 31 11/29/2016   Lab Results  Component Value Date   ALT 56 11/27/2016   AST 39 11/27/2016   ALKPHOS 166 (H) 11/27/2016   BILITOT 0.6 11/27/2016     NARRATIVE: Bruce [name] was seen today for weekly treatment management. The chart was checked and the patient's films were reviewed.  The patient remains an inpatient. He states that he has been experiencing some fatigue. Remains on supplemental oxygen. He states that his breathing has been stable recently. His daughter states that she feels he is doing a little better in some respects.  PHYSICAL EXAMINATION: vitals were not taken for this visit.     Alert, no acute distress, lying in a hospital bed  ASSESSMENT: The patient is doing  satisfactorily with treatment.  PLAN: We will continue with the patient's radiation treatment as planned.

## 2016-11-29 NOTE — Progress Notes (Signed)
MD to be paged to see patient on [place]#3  No nursing assessment

## 2016-11-29 NOTE — Progress Notes (Signed)
Pt was only able to tolerate the CPT vest to 3 min. RT adjusted the setting however pt was still uncomfortable and ask to have it removed via family member.  Pt does not want anymore CPT this evening and request for CPT in the morning via bed.  RT notified RN.

## 2016-11-29 NOTE — Progress Notes (Signed)
Speech Language Pathology Treatment: Dysphagia  Patient Details Name: Rollyn Scialdone MRN: [id number] DOB: November 14, 1961 Today's Date: 11/29/2016 Time: 4037-5436 SLP Time Calculation (min) (ACUTE ONLY): 17 min  Assessment / Plan / Recommendation Clinical Impression  Pt seen with lunch meal. For some reason, pts diet order was regular/honey, though he is recommended to eat puree. SLP provided appropriate textures and thickened liquid to honey and fed via teaspoon. Pt also fed himself at a slow rate with no verbal cues needed. Once friend of the family arrived, he verbalized that the family was aware of appropriate diet recs and wanted to continue puree and honey thick liquids as recommended per the MBS. Pt tolerated this with intermittent throat clearing, though high risk of silent aspiration persists. Pt to continue current diet and recommendations.    HPI HPI: 56 year old non-English speaking male admitted 12/06/2016 due to coughing, choking and difficulty breathing.  PMH significant for metastatic esophageal CA (dx 2013), SVC syndrome, tracheal compression, lung mets, esophagectomy (2014). Barium Swallow was completed 02/2016, which revealed flash penetration, no hypopharyngeal stricture, no aspiration.       SLP Plan  Continue with current plan of care     Recommendations  Diet recommendations: Dysphagia 1 (puree);Honey-thick liquid Liquids provided via: Teaspoon Medication Administration: Whole meds with liquid Supervision: Patient able to self feed Compensations: Minimize environmental distractions;Slow rate;Small sips/bites;Multiple dry swallows after each bite/sip Postural Changes and/or Swallow Maneuvers: Seated upright 90 degrees;Upright 30-60 min after meal                Plan: Continue with current plan of care       GO                Laurana Magistro, Katherene Ponto 11/29/2016, 1:49 PM

## 2016-11-30 LAB — BASIC METABOLIC PANEL
Anion gap: 6 (ref 5–15)
BUN: 13 mg/dL (ref 6–20)
CO2: 30 mmol/L (ref 22–32)
Calcium: 8.7 mg/dL — ABNORMAL LOW (ref 8.9–10.3)
Chloride: 91 mmol/L — ABNORMAL LOW (ref 101–111)
Creatinine, Ser: 0.49 mg/dL — ABNORMAL LOW (ref 0.61–1.24)
GFR calc non Af Amer: >60 mL/min (ref >60)
Glucose, Bld: 143 mg/dL — ABNORMAL HIGH (ref 65–99)
POTASSIUM: 4.3 mmol/L (ref 3.5–5.1)
SODIUM: 127 mmol/L — AB (ref 135–145)

## 2016-11-30 LAB — CULTURE, BLOOD (ROUTINE X 2)
CULTURE: NO GROWTH
Culture: NO GROWTH

## 2016-11-30 LAB — CULTURE, BODY FLUID-BOTTLE

## 2016-11-30 LAB — CBC
HEMATOCRIT: 26.7 % — AB (ref 39.0–52.0)
HEMOGLOBIN: 9 g/dL — AB (ref 13.0–17.0)
MCH: 27.3 pg (ref 26.0–34.0)
MCHC: 33.7 g/dL (ref 30.0–36.0)
MCV: 80.9 fL (ref 78.0–100.0)
Platelets: 150 10*3/uL (ref 150–400)
RBC: 3.3 MIL/uL — AB (ref 4.22–5.81)
RDW: 13.7 % (ref 11.5–15.5)
WBC: 5.2 10*3/uL (ref 4.0–10.5)

## 2016-11-30 LAB — CULTURE, BODY FLUID W GRAM STAIN -BOTTLE: Culture: NO GROWTH

## 2016-11-30 MED ORDER — ALPRAZOLAM 0.25 MG PO TABS
0.2500 mg | ORAL_TABLET | Freq: Once | ORAL | Status: AC
  Administered 2016-11-30: 0.25 mg via ORAL
  Filled 2016-11-30: qty 1

## 2016-11-30 MED ORDER — GUAIFENESIN ER 600 MG PO TB12
1200.0000 mg | ORAL_TABLET | Freq: Two times a day (BID) | ORAL | Status: DC
  Administered 2016-11-30 – 2016-12-02 (×3): 1200 mg via ORAL
  Filled 2016-11-30 (×4): qty 2

## 2016-11-30 MED ORDER — ALPRAZOLAM 0.5 MG PO TABS
0.5000 mg | ORAL_TABLET | Freq: Three times a day (TID) | ORAL | Status: DC | PRN
  Administered 2016-11-30 – 2016-12-02 (×3): 0.5 mg via ORAL
  Filled 2016-11-30 (×3): qty 1

## 2016-11-30 MED ORDER — IPRATROPIUM-ALBUTEROL 0.5-2.5 (3) MG/3ML IN SOLN
3.0000 mL | Freq: Four times a day (QID) | RESPIRATORY_TRACT | Status: DC | PRN
  Administered 2016-11-30 (×2): 3 mL via RESPIRATORY_TRACT
  Filled 2016-11-30 (×2): qty 3

## 2016-11-30 NOTE — Progress Notes (Signed)
PROGRESS NOTE  Bruce [name]  UTM:[id number] DOB: 1960-12-03 DOA: 12/18/2016 PCP: No PCP Per Patient  Outpatient Specialists: Oncology, Dr. Benay Spice  Brief Narrative: Bruce [name] is a 56 y.o. male with a history of metastatic esophageal cancer complicated by loculated R pleural effusion, SVC syndrome and tracheal compression on palliative chemo and XRT who presented with sudden coughing and dyspnea. This was the day after routine irinotecan/cisplatin infusion. EMS was called and on arrival in ED he was tachycardic into 150's and hypoxemic requiring 5L. Lactate was 6.7, WBC 14.3, Na 126. CXR showed large right effusion, and left effusion, tracheal narrowing. CT PE protocol showed no PE but did show large loculated right effusion, moderate left effusion, known severe SVC and brachiocephalic narrowing, new severe pulmonary artery narrowing and progressive consolidation in right lung as well as irregular narrowing of right mainstem bronchus. He was given 30 cc/kg bolus, cultures were obtained, and vanc and Zosyn were administered. He was admitted for HCAP. He had therapeutic thoracentesis 1/8 with 900cc fluid return, and required pressor support for hypotension 1/9 and was transferred to [facility] service. Thus far hypotension has marginally improved and pressors have been weaned off as of 1/10. Hypotension was thought to be artifactual due to UE edema. Viral panel grew rhinovirus. Palliative chest XRT had been initiated 11/19/16 and was continued as a trial per oncology to assess response. Plan is to continue this while treating supportively for acute respiratory failure and reassess response. Will discharge with home hospice if no appreciable improvement with XRT.   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 Possibly contribution by aspiration PNA and rhinovirus.  - Abx as below - Pulmonology signed off 1/11 - Oxygen by nasal cannula to maintain SpO2 >92% - Continue mucolytics and chest physiotherapy.  - Continue morphine prn air hunger and increased alprazolam to 0.'5mg'$  TID prn  Sepsis with hypotension, suspect from pneumonia vs. SIRS from advanced malignancy: With evidence of aspiration, will cover pulmonary/anaerobes. Rhinovirus positive. PCT < 0.10. LA normalized, leukocytosis resolved. - Would NOT want to be put back on pressors and confirmed NO CODE BLUE. Cortisol wnl, no indication for steroids. - Monitor BP on lower extremities.  - Diet and aspiration precautions per SLP - Completed 7 day course of abx    Bilateral Large Pleural Effusion: Reported subjective improvement in dyspnea following thoracentesis 1/8, though CXR appears to be worsening. Complete opacification of right lung. Bedside U/S 1/10 confirmed right effusion not amenable to thoracentesis, not felt to be bronch candidate, certainly not a VATS candidate at this time.  - Monitor  Metastatic esophageal cancer:Discussed with Dr. Benay Spice. Plan is to continue XRT daily to measure response in a few days and refer for home vs. residential hospice if no improvement.  - Continue morphine SL or IV prn pain, dyspnea - Continue daily radiation, s/p chemo 1/12 per Dr. Benay Spice.   Hyponatremia: Chronic.  - Monitor BMP, continue NS @  50cc/hr  Normocytic anemia: -Suspect from chronic disease/cancer -Currently stable  DVT prophylaxis: Lovenox Code  Status: DNR, no pressors Family Communication: Daughter at bedside who acts as interpretor due to patient again refusing telephone interpretation services. Disposition Plan: Confirmed that pt would not want resuscitation including pressors were his blood pressure not to respond to fluids. Continue treatment on med-surg onc floor. Disposition pending clinical course.   Consultants:   Pulmonology, Dr. Halford Chessman  Oncology, Dr. Benay Spice  Radiation oncology, Dr. Lisbeth Renshaw  Procedures:  CT angio chest 1/07 >> mediastinal mass with narrowing of Rt PA and SVC, large B/L effusions increased on Lt and loculated on Rt, RLL consolidation, multiple nodules Lt thoracentesis 1/08 >> 910 ml yellow fluid, glucose 111, protein 3.4, LDH 195, WBC 915 (81%L)  Antimicrobials: Vancomycin 1/06 >> 1/06 Zosyn 1/06 >> 1/06 Rocephin 1/07 >> 1/11 Zithromax 1/07 >> 1/11 Zosyn 1/11 >> 1/13  Subjective: Had an episode of acute dyspnea early this morning which responded to anxiolytic. Says vest is not helpful for PT, but the bed was. Eating well, food from home. Continue to cough up congestion. No chest pain or palpitations.   Objective: Vitals:   11/29/16 2156 11/30/16 0314 11/30/16 0513 11/30/16 1152  BP:   114/72   Pulse:   (!) 117   Resp:   17   Temp:   (!) 96.4 F (35.8 C)   TempSrc:   Axillary   SpO2: 100% 100% 100% 98%  Weight:      Height:        Intake/Output Summary (Last 24 hours) at 11/30/16 1415 Last data filed at 11/30/16 0600  Gross per 24 hour  Intake              710 ml  Output             1250 ml  Net             -540 ml   Filed Weights   11/24/16 0500 11/27/16 0330 11/29/16 0800  Weight: 43.3 kg (95 lb 7.4 oz) 44.7 kg (98 lb 8.7 oz) 46 kg (101 lb 6.6 oz)    Examination: General exam: 56 y.o. male in apparent discomfort but no respiratory distress Respiratory system: Nonlabored with normal rate this AM with supplemental oxygen. Absent R side breath sounds and diminished with crackles at  left at mid and lower lung zones. Cardiovascular system: Regular rate and rhythm. No murmur, rub, or gallop. + JVD, and no pedal edema. Facial swelling and plethora. UE's markedly edematous.  Gastrointestinal system: Abdomen soft, non-tender, non-distended, with normoactive bowel sounds. No organomegaly or masses felt. Central nervous system: Alert and oriented. No focal neurological deficits. Extremities: Warm, no deformities Skin: Telangiectasias across upper chest. No other lesions noted. Psychiatry: Judgement and insight appear normal. Mood & affect appropriate.   Data Reviewed: I have personally reviewed following labs and imaging studies  CBC:  Recent Labs Lab 11/24/16 0110  11/26/16 0412 11/27/16 0402 11/28/16 0325 11/29/16 0349 11/30/16 0436  WBC 14.3*  < > 12.3* 9.5 7.1 6.2 5.2  NEUTROABS 13.9*  --   --   --  7.2  --   --   HGB 10.6*  < > 9.8* 8.9* 8.6* 8.1* 9.0*  HCT 31.3*  < > 29.1* 26.5* 25.6* 23.7* 26.7*  MCV 80.3  < > 81.5 80.8 79.5 81.2 80.9  PLT 262  < > 271 218 185 165 150  < > = values in this interval not displayed. Basic  Metabolic Panel:  Recent Labs Lab 11/26/16 0412 11/27/16 0402 11/28/16 0325 11/29/16 0349 11/30/16 0436  NA 126* 128* 126* 131* 127*  K 3.7 4.0 3.6 3.7 4.3  CL 90* 93* 92* 94* 91*  CO2 '30 29 27 31 30  '$ GLUCOSE 100* 116* 100* 90 143*  BUN '18 15 17 16 13  '$ CREATININE 0.51* 0.55* 0.45* 0.39* 0.49*  CALCIUM 8.9 8.9 8.9 9.0 8.7*  MG  --   --   --  2.0  --    GFR: Estimated Creatinine Clearance: 68.7 mL/min (by C-G formula based on SCr of 0.49 mg/dL (L)). Liver Function Tests:  Recent Labs Lab 11/27/16 0402  AST 39  ALT 56  ALKPHOS 166*  BILITOT 0.6  PROT 6.4*  ALBUMIN 2.7*   No results for input(s): LIPASE, AMYLASE in the last 168 hours. No results for input(s): AMMONIA in the last 168 hours. Coagulation Profile: No results for input(s): INR, PROTIME in the last 168 hours. Cardiac Enzymes: No results for input(s): CKTOTAL,  CKMB, CKMBINDEX, TROPONINI in the last 168 hours. BNP (last 3 results) No results for input(s): PROBNP in the last 8760 hours. HbA1C: No results for input(s): HGBA1C in the last 72 hours. CBG: No results for input(s): GLUCAP in the last 168 hours. Lipid Profile: No results for input(s): CHOL, HDL, LDLCALC, TRIG, CHOLHDL, LDLDIRECT in the last 72 hours. Thyroid Function Tests: No results for input(s): TSH, T4TOTAL, FREET4, T3FREE, THYROIDAB in the last 72 hours. Anemia Panel: No results for input(s): VITAMINB12, FOLATE, FERRITIN, TIBC, IRON, RETICCTPCT in the last 72 hours. Urine analysis:    Component Value Date/Time   COLORURINE YELLOW 11/24/2016 [place] 11/24/2016 0333   LABSPEC 1.020 11/24/2016 0333   LABSPEC 1.005 07/29/2014 1543   PHURINE 5.0 11/24/2016 0333   GLUCOSEU 100 (A) 11/24/2016 0333   GLUCOSEU Negative 07/29/2014 1543   HGBUR SMALL (A) 11/24/2016 0333   BILIRUBINUR NEGATIVE 11/24/2016 0333   BILIRUBINUR Negative 07/29/2014 1543   KETONESUR NEGATIVE 11/24/2016 0333   PROTEINUR NEGATIVE 11/24/2016 0333   UROBILINOGEN 0.2 07/29/2014 1543   NITRITE NEGATIVE 11/24/2016 0333   LEUKOCYTESUR NEGATIVE 11/24/2016 0333   LEUKOCYTESUR Negative 07/29/2014 1543   Sepsis Labs: '@LABRCNTIP'$ (procalcitonin:4,lacticidven:4)  ) Recent Results (from the past 240 hour(s))  Respiratory Panel by PCR     Status: Abnormal   Collection Time: 11/24/16  1:30 AM  Result Value Ref Range Status   Adenovirus NOT DETECTED NOT DETECTED Final   Coronavirus 229E NOT DETECTED NOT DETECTED Final   Coronavirus HKU1 NOT DETECTED NOT DETECTED Final   Coronavirus NL63 NOT DETECTED NOT DETECTED Final   Coronavirus OC43 NOT DETECTED NOT DETECTED Final   Metapneumovirus NOT DETECTED NOT DETECTED Final   Rhinovirus / Enterovirus DETECTED (A) NOT DETECTED Final   Influenza A NOT DETECTED NOT DETECTED Final   Influenza B NOT DETECTED NOT DETECTED Final   Parainfluenza Virus 1 NOT  DETECTED NOT DETECTED Final   Parainfluenza Virus 2 NOT DETECTED NOT DETECTED Final   Parainfluenza Virus 3 NOT DETECTED NOT DETECTED Final   Parainfluenza Virus 4 NOT DETECTED NOT DETECTED Final   Respiratory Syncytial Virus NOT DETECTED NOT DETECTED Final   Bordetella pertussis NOT DETECTED NOT DETECTED Final   Chlamydophila pneumoniae NOT DETECTED NOT DETECTED Final   Mycoplasma pneumoniae NOT DETECTED NOT DETECTED Final    Comment: Performed at [facility]  Blood Culture (routine x 2)     Status: None (Preliminary result)   Collection  Time: 11/24/16  2:10 AM  Result Value Ref Range Status   Specimen Description BLOOD LEFT HAND  Final   Special Requests BOTTLES DRAWN AEROBIC AND ANAEROBIC 5ML  Final   Culture   Final    NO GROWTH 4 DAYS Performed at [facility]    Report Status PENDING  Incomplete  Blood Culture (routine x 2)     Status: None (Preliminary result)   Collection Time: 11/24/16  2:12 AM  Result Value Ref Range Status   Specimen Description BLOOD LEFT HAND  Final   Special Requests BOTTLES DRAWN AEROBIC AND ANAEROBIC 5ML  Final   Culture   Final    NO GROWTH 4 DAYS Performed at [facility]    Report Status PENDING  Incomplete  Urine culture     Status: None   Collection Time: 11/24/16  3:33 AM  Result Value Ref Range Status   Specimen Description URINE, CLEAN CATCH  Final   Special Requests NONE  Final   Culture NO GROWTH Performed at [facility]   Final   Report Status 11/25/2016 FINAL  Final  MRSA PCR Screening     Status: None   Collection Time: 11/24/16  5:41 AM  Result Value Ref Range Status   MRSA by PCR NEGATIVE NEGATIVE Final    Comment:        The GeneXpert MRSA Assay (FDA approved for NASAL specimens only), is one component of a comprehensive MRSA colonization surveillance program. It is not intended to diagnose MRSA infection nor to guide or monitor treatment for MRSA infections.   Culture, body  fluid-bottle     Status: None   Collection Time: 11/25/16 10:27 AM  Result Value Ref Range Status   Specimen Description PLEURAL  Final   Special Requests NONE  Final   Culture   Final    NO GROWTH 5 DAYS Performed at [facility]    Report Status 11/30/2016 FINAL  Final  Gram stain     Status: None   Collection Time: 11/25/16 10:27 AM  Result Value Ref Range Status   Specimen Description PLEURAL  Final   Special Requests NONE  Final   Gram Stain   Final    FEW WBC PRESENT, PREDOMINANTLY MONONUCLEAR NO ORGANISMS SEEN Performed at [facility]    Report Status 11/25/2016 FINAL  Final     Radiology Studies: No results found.  Scheduled Meds: . sodium chloride   Intravenous Once  . chlorhexidine  15 mL Mouth Rinse BID  . enoxaparin (LOVENOX) injection  30 mg Subcutaneous Daily  . feeding supplement  1 Container Oral BID BM  . feeding supplement (ENSURE ENLIVE)  237 mL Oral BID BM  . guaiFENesin  600 mg Oral BID  . mouth rinse  15 mL Mouth Rinse q12n4p  . multivitamin with minerals  1 tablet Oral Daily  . piperacillin-tazobactam (ZOSYN)  IV  3.375 g Intravenous Q8H  . sodium chloride flush  3 mL Intravenous Q12H   Continuous Infusions: . sodium chloride 50 mL/hr at 11/29/16 1500     LOS: 6 days   Time spent: 25 minutes.  Vance Gather, MD [facility] Pager [phone]  If 7PM-7AM, please contact night-coverage [email or web address] Password [email or web address] 11/30/2016, 2:15 PM

## 2016-11-30 NOTE — Progress Notes (Signed)
  Called to the room by family member. Pt found very anxious breathing very , , shallow. V/S 114/72, 117, RR greater than 22,  02 100% on 3L [place] . Albuterol 2.'5mg'$  given,  Paged MD Alexus Hugolemeyer on call to make aware, orders received for increase Xanax 0.25 mg, paged RT to bedside. Pt can not produce a cough on his on, this is making him very upset and anxious. Per family they have refused the  Chest PT vest, they have requested manuel chest percussions. Pt's anxiety has decresed at this time alert x4 02 97% on 3lnc. Report given to [facility] who will resume care. Will continue to monitor.

## 2016-12-01 LAB — CBC
HCT: 25.6 % — ABNORMAL LOW (ref 39.0–52.0)
HEMOGLOBIN: 8.8 g/dL — AB (ref 13.0–17.0)
MCH: 27.8 pg (ref 26.0–34.0)
MCHC: 34.4 g/dL (ref 30.0–36.0)
MCV: 81 fL (ref 78.0–100.0)
PLATELETS: 162 10*3/uL (ref 150–400)
RBC: 3.16 MIL/uL — AB (ref 4.22–5.81)
RDW: 13.8 % (ref 11.5–15.5)
WBC: 10.9 10*3/uL — AB (ref 4.0–10.5)

## 2016-12-01 MED ORDER — ALBUTEROL SULFATE (2.5 MG/3ML) 0.083% IN NEBU
2.5000 mg | INHALATION_SOLUTION | RESPIRATORY_TRACT | Status: DC | PRN
  Administered 2016-12-01 – 2016-12-02 (×3): 2.5 mg via RESPIRATORY_TRACT
  Filled 2016-12-01 (×5): qty 3

## 2016-12-01 MED ORDER — MORPHINE SULFATE (PF) 2 MG/ML IV SOLN
1.0000 mg | INTRAVENOUS | Status: DC | PRN
  Administered 2016-12-01 – 2016-12-02 (×5): 1 mg via INTRAVENOUS
  Filled 2016-12-01 (×6): qty 1

## 2016-12-01 MED ORDER — MORPHINE SULFATE (CONCENTRATE) 10 MG/0.5ML PO SOLN: 5.0000 mg | ORAL | Status: DC | PRN

## 2016-12-01 MED ORDER — IPRATROPIUM-ALBUTEROL 0.5-2.5 (3) MG/3ML IN SOLN
3.0000 mL | Freq: Four times a day (QID) | RESPIRATORY_TRACT | Status: DC
  Administered 2016-12-01 – 2016-12-02 (×6): 3 mL via RESPIRATORY_TRACT
  Filled 2016-12-01 (×7): qty 3

## 2016-12-01 NOTE — Progress Notes (Signed)
PROGRESS NOTE  Bruce [name]  LDJ:[id number] DOB: 1961/09/27 DOA: 11/21/2016 PCP: No PCP Per Patient  Outpatient Specialists: Oncology, Dr. Benay Spice  Brief Narrative: Bruce [name] is a 56 y.o. male with a history of metastatic esophageal cancer complicated by loculated R pleural effusion, SVC syndrome and tracheal compression on palliative chemo and XRT who presented with sudden coughing and dyspnea. This was the day after routine irinotecan/cisplatin infusion. EMS was called and on arrival in ED he was tachycardic into 150's and hypoxemic requiring 5L. Lactate was 6.7, WBC 14.3, Na 126. CXR showed large right effusion, and left effusion, tracheal narrowing. CT PE protocol showed no PE but did show large loculated right effusion, moderate left effusion, known severe SVC and brachiocephalic narrowing, new severe pulmonary artery narrowing and progressive consolidation in right lung as well as irregular narrowing of right mainstem bronchus. He was given 30 cc/kg bolus, cultures were obtained, and vanc and Zosyn were administered. He was admitted for HCAP. He had therapeutic thoracentesis 1/8 with 900cc fluid return, and required pressor support for hypotension 1/9 and was transferred to [facility] service. Thus far hypotension has marginally improved and pressors have been weaned off as of 1/10. Hypotension was thought to be artifactual due to UE edema. Viral panel grew rhinovirus. Palliative chest XRT had been initiated 11/19/16 and was continued as a trial per oncology to assess response. Plan is to continue this while treating supportively for acute respiratory failure and reassess response. Will discharge with home hospice if no appreciable improvement with XRT.   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 Possibly contribution by aspiration PNA and rhinovirus.  - Abx completed as below - Pulmonology signed off 1/11 - Oxygen by nasal cannula to maintain SpO2 >92%. Has subjective dyspnea more prominently than hypoxemia. Worse at night. - Increase dose of mucolytics and continue chest physiotherapy.  - Continue morphine prn air hunger and increased alprazolam to 0.'5mg'$  TID prn  Sepsis with hypotension, suspect from pneumonia vs. SIRS from advanced malignancy: With evidence of aspiration, will cover pulmonary/anaerobes. Rhinovirus positive. PCT < 0.10. LA normalized, leukocytosis resolved. - Would NOT want to be put back on pressors and confirmed NO CODE BLUE. Cortisol wnl, no indication for steroids. - Monitor BP on lower extremities.  - Diet and aspiration precautions per SLP - Completed 7 day course of abx    Bilateral large pleural effusions: Reported subjective improvement in dyspnea following thoracentesis 1/8, though CXR appears to be worsening. Complete opacification of right lung. Bedside U/S 1/10 confirmed right effusion not amenable to thoracentesis, not felt to be bronch candidate, certainly not a VATS candidate at this time.  - Monitor  Metastatic esophageal cancer:Discussed with Dr. Benay Spice. Plan is to continue XRT daily to measure response in a few days and refer for home vs. residential hospice if no improvement.  - Continue morphine SL or IV prn pain, dyspnea - Continue daily radiation, s/p chemo  1/12 per Dr. Benay Spice.   Hyponatremia: Chronic, stable. - Monitor BMP - Appears euvolemic, eating  well - will DC IVF's  Normocytic anemia: -Suspect from chronic disease/cancer -Currently stable  DVT prophylaxis: Lovenox Code Status: DNR, no pressors Family Communication: Wife at bedside and daughter at bedside who acts as interpretor due to patient again refusing telephone interpretation services. Disposition Plan: Continue treatment on med-surg onc floor. Disposition pending clinical course.   Consultants:   Pulmonology, Dr. Halford Chessman  Oncology, Dr. Benay Spice  Radiation oncology, Dr. Lisbeth Renshaw  Procedures:  CT angio chest 1/07 >> mediastinal mass with narrowing of Rt PA and SVC, large B/L effusions increased on Lt and loculated on Rt, RLL consolidation, multiple nodules Lt thoracentesis 1/08 >> 910 ml yellow fluid, glucose 111, protein 3.4, LDH 195, WBC 915 (81%L)  Antimicrobials: Vancomycin 1/06 >> 1/06 Zosyn 1/06 >> 1/06 Rocephin 1/07 >> 1/11 Zithromax 1/07 >> 1/11 Zosyn 1/11 >> 1/13  Subjective: Had an episode of acute dyspnea again overnight, seems to have dyspnea/anxiety worst at night responsive to morphine. Daughter wonders if morphine could be given more often. Continues to cough up congestion. No chest pain or palpitations.   Objective: Vitals:   11/30/16 2053 12/01/16 0115 12/01/16 0540 12/01/16 0810  BP: 123/77  125/88   Pulse: (!) 128  (!) 129   Resp: 16  18   Temp: 97.4 F (36.3 C)  97.4 F (36.3 C)   TempSrc: Oral  Axillary   SpO2: 99% 99% 96% 100%  Weight:      Height:        Intake/Output Summary (Last 24 hours) at 12/01/16 1205 Last data filed at 12/01/16 0540  Gross per 24 hour  Intake              570 ml  Output             1275 ml  Net             -705 ml   Filed Weights   11/27/16 0330 11/29/16 0800  Weight: 44.7 kg (98 lb 8.7 oz) 46 kg (101 lb 6.6 oz)    Examination: General exam: 56 y.o. male in apparent discomfort but no respiratory distress Respiratory system: Nonlabored with normal rate this AM with supplemental oxygen. Absent R side  breath sounds and diminished with crackles at left at mid and lower lung zones. Cardiovascular system: Regular rate and rhythm. No murmur, rub, or gallop. + JVD, and no pedal edema. Facial swelling and plethora. UE's markedly edematous.  Gastrointestinal system: Abdomen soft, non-tender, non-distended, with normoactive bowel sounds. No organomegaly or masses felt. Central nervous system: Alert and oriented. No focal neurological deficits. Extremities: Warm, no deformities Skin: Telangiectasias across upper chest. No other lesions noted. Psychiatry: Judgement and insight appear normal. Mood & affect appropriate.   Data Reviewed: I have personally reviewed following labs and imaging studies  CBC:  Recent Labs Lab 11/27/16 0402 11/28/16 0325 11/29/16 0349 11/30/16 0436 12/01/16 0500  WBC 9.5 7.1 6.2 5.2 10.9*  NEUTROABS  --  7.2  --   --   --   HGB 8.9* 8.6* 8.1* 9.0* 8.8*  HCT 26.5* 25.6* 23.7* 26.7* 25.6*  MCV 80.8 79.5 81.2 80.9 81.0  PLT 218 185 165 150 998   Basic Metabolic Panel:  Recent Labs Lab 11/26/16 0412 11/27/16 0402 11/28/16 0325 11/29/16 0349 11/30/16 0436  NA 126* 128* 126* 131* 127*  K 3.7 4.0 3.6 3.7 4.3  CL 90* 93* 92* 94*  91*  CO2 '30 29 27 31 30  '$ GLUCOSE 100* 116* 100* 90 143*  BUN '18 15 17 16 13  '$ CREATININE 0.51* 0.55* 0.45* 0.39* 0.49*  CALCIUM 8.9 8.9 8.9 9.0 8.7*  MG  --   --   --  2.0  --    GFR: Estimated Creatinine Clearance: 68.7 mL/min (by C-G formula based on SCr of 0.49 mg/dL (L)). Liver Function Tests:  Recent Labs Lab 11/27/16 0402  AST 39  ALT 56  ALKPHOS 166*  BILITOT 0.6  PROT 6.4*  ALBUMIN 2.7*   No results for input(s): LIPASE, AMYLASE in the last 168 hours. No results for input(s): AMMONIA in the last 168 hours. Coagulation Profile: No results for input(s): INR, PROTIME in the last 168 hours. Cardiac Enzymes: No results for input(s): CKTOTAL, CKMB, CKMBINDEX, TROPONINI in the last 168 hours. BNP (last 3 results) No  results for input(s): PROBNP in the last 8760 hours. HbA1C: No results for input(s): HGBA1C in the last 72 hours. CBG: No results for input(s): GLUCAP in the last 168 hours. Lipid Profile: No results for input(s): CHOL, HDL, LDLCALC, TRIG, CHOLHDL, LDLDIRECT in the last 72 hours. Thyroid Function Tests: No results for input(s): TSH, T4TOTAL, FREET4, T3FREE, THYROIDAB in the last 72 hours. Anemia Panel: No results for input(s): VITAMINB12, FOLATE, FERRITIN, TIBC, IRON, RETICCTPCT in the last 72 hours. Urine analysis:    Component Value Date/Time   COLORURINE YELLOW 11/24/2016 [place] 11/24/2016 0333   LABSPEC 1.020 11/24/2016 0333   LABSPEC 1.005 07/29/2014 1543   PHURINE 5.0 11/24/2016 0333   GLUCOSEU 100 (A) 11/24/2016 0333   GLUCOSEU Negative 07/29/2014 1543   HGBUR SMALL (A) 11/24/2016 0333   BILIRUBINUR NEGATIVE 11/24/2016 0333   BILIRUBINUR Negative 07/29/2014 1543   KETONESUR NEGATIVE 11/24/2016 0333   PROTEINUR NEGATIVE 11/24/2016 0333   UROBILINOGEN 0.2 07/29/2014 1543   NITRITE NEGATIVE 11/24/2016 0333   LEUKOCYTESUR NEGATIVE 11/24/2016 0333   LEUKOCYTESUR Negative 07/29/2014 1543   Sepsis Labs: '@LABRCNTIP'$ (procalcitonin:4,lacticidven:4)  ) Recent Results (from the past 240 hour(s))  Respiratory Panel by PCR     Status: Abnormal   Collection Time: 11/24/16  1:30 AM  Result Value Ref Range Status   Adenovirus NOT DETECTED NOT DETECTED Final   Coronavirus 229E NOT DETECTED NOT DETECTED Final   Coronavirus HKU1 NOT DETECTED NOT DETECTED Final   Coronavirus NL63 NOT DETECTED NOT DETECTED Final   Coronavirus OC43 NOT DETECTED NOT DETECTED Final   Metapneumovirus NOT DETECTED NOT DETECTED Final   Rhinovirus / Enterovirus DETECTED (A) NOT DETECTED Final   Influenza A NOT DETECTED NOT DETECTED Final   Influenza B NOT DETECTED NOT DETECTED Final   Parainfluenza Virus 1 NOT DETECTED NOT DETECTED Final   Parainfluenza Virus 2 NOT DETECTED NOT DETECTED  Final   Parainfluenza Virus 3 NOT DETECTED NOT DETECTED Final   Parainfluenza Virus 4 NOT DETECTED NOT DETECTED Final   Respiratory Syncytial Virus NOT DETECTED NOT DETECTED Final   Bordetella pertussis NOT DETECTED NOT DETECTED Final   Chlamydophila pneumoniae NOT DETECTED NOT DETECTED Final   Mycoplasma pneumoniae NOT DETECTED NOT DETECTED Final    Comment: Performed at [facility]  Blood Culture (routine x 2)     Status: None   Collection Time: 11/24/16  2:10 AM  Result Value Ref Range Status   Specimen Description BLOOD LEFT HAND  Final   Special Requests BOTTLES DRAWN AEROBIC AND ANAEROBIC 5ML  Final   Culture   Final  NO GROWTH 5 DAYS Performed at [facility]    Report Status 11/30/2016 FINAL  Final  Blood Culture (routine x 2)     Status: None   Collection Time: 11/24/16  2:12 AM  Result Value Ref Range Status   Specimen Description BLOOD LEFT HAND  Final   Special Requests BOTTLES DRAWN AEROBIC AND ANAEROBIC 5ML  Final   Culture   Final    NO GROWTH 5 DAYS Performed at [facility]    Report Status 11/30/2016 FINAL  Final  Urine culture     Status: None   Collection Time: 11/24/16  3:33 AM  Result Value Ref Range Status   Specimen Description URINE, CLEAN CATCH  Final   Special Requests NONE  Final   Culture NO GROWTH Performed at [facility]   Final   Report Status 11/25/2016 FINAL  Final  MRSA PCR Screening     Status: None   Collection Time: 11/24/16  5:41 AM  Result Value Ref Range Status   MRSA by PCR NEGATIVE NEGATIVE Final    Comment:        The GeneXpert MRSA Assay (FDA approved for NASAL specimens only), is one component of a comprehensive MRSA colonization surveillance program. It is not intended to diagnose MRSA infection nor to guide or monitor treatment for MRSA infections.   Culture, body fluid-bottle     Status: None   Collection Time: 11/25/16 10:27 AM  Result Value Ref Range Status   Specimen Description  PLEURAL  Final   Special Requests NONE  Final   Culture   Final    NO GROWTH 5 DAYS Performed at [facility]    Report Status 11/30/2016 FINAL  Final  Gram stain     Status: None   Collection Time: 11/25/16 10:27 AM  Result Value Ref Range Status   Specimen Description PLEURAL  Final   Special Requests NONE  Final   Gram Stain   Final    FEW WBC PRESENT, PREDOMINANTLY MONONUCLEAR NO ORGANISMS SEEN Performed at [facility]    Report Status 11/25/2016 FINAL  Final     Radiology Studies: No results found.  Scheduled Meds: . sodium chloride   Intravenous Once  . chlorhexidine  15 mL Mouth Rinse BID  . enoxaparin (LOVENOX) injection  30 mg Subcutaneous Daily  . feeding supplement  1 Container Oral BID BM  . feeding supplement (ENSURE ENLIVE)  237 mL Oral BID BM  . guaiFENesin  1,200 mg Oral BID  . ipratropium-albuterol  3 mL Nebulization QID  . mouth rinse  15 mL Mouth Rinse q12n4p  . multivitamin with minerals  1 tablet Oral Daily  . sodium chloride flush  3 mL Intravenous Q12H   Continuous Infusions: . sodium chloride 50 mL/hr at 11/30/16 1756     LOS: 7 days   Time spent: 15 minutes.  Vance Gather, MD [facility] Pager [phone]  If 7PM-7AM, please contact night-coverage [email or web address] Password [email or web address] 12/01/2016, 12:05 PM

## 2016-12-02 ENCOUNTER — Ambulatory Visit: Admission: RE | Admit: 2016-12-02 | Payer: BLUE CROSS/BLUE SHIELD | Source: Ambulatory Visit

## 2016-12-02 LAB — BASIC METABOLIC PANEL
Anion gap: 8 (ref 5–15)
BUN: 17 mg/dL (ref 6–20)
CALCIUM: 8.8 mg/dL — AB (ref 8.9–10.3)
CO2: 31 mmol/L (ref 22–32)
CREATININE: 0.37 mg/dL — AB (ref 0.61–1.24)
Chloride: 92 mmol/L — ABNORMAL LOW (ref 101–111)
GFR calc non Af Amer: >60 mL/min (ref >60)
Glucose, Bld: 94 mg/dL (ref 65–99)
Potassium: 4 mmol/L (ref 3.5–5.1)
SODIUM: 131 mmol/L — AB (ref 135–145)

## 2016-12-02 LAB — CBC
HCT: 24.1 % — ABNORMAL LOW (ref 39.0–52.0)
Hemoglobin: 8.1 g/dL — ABNORMAL LOW (ref 13.0–17.0)
MCH: 27.7 pg (ref 26.0–34.0)
MCHC: 33.6 g/dL (ref 30.0–36.0)
MCV: 82.5 fL (ref 78.0–100.0)
PLATELETS: 152 10*3/uL (ref 150–400)
RBC: 2.92 MIL/uL — AB (ref 4.22–5.81)
RDW: 14 % (ref 11.5–15.5)
WBC: 7.5 10*3/uL (ref 4.0–10.5)

## 2016-12-03 ENCOUNTER — Ambulatory Visit: Payer: BLUE CROSS/BLUE SHIELD

## 2016-12-03 ENCOUNTER — Other Ambulatory Visit: Payer: BLUE CROSS/BLUE SHIELD

## 2016-12-03 ENCOUNTER — Other Ambulatory Visit (HOSPITAL_COMMUNITY): Payer: BLUE CROSS/BLUE SHIELD

## 2016-12-04 ENCOUNTER — Other Ambulatory Visit: Payer: Self-pay | Admitting: Nurse Practitioner

## 2016-12-04 ENCOUNTER — Ambulatory Visit: Payer: BLUE CROSS/BLUE SHIELD

## 2016-12-05 ENCOUNTER — Ambulatory Visit: Payer: BLUE CROSS/BLUE SHIELD

## 2016-12-06 ENCOUNTER — Encounter: Payer: Self-pay | Admitting: Radiation Oncology

## 2016-12-06 ENCOUNTER — Ambulatory Visit: Payer: BLUE CROSS/BLUE SHIELD

## 2016-12-09 ENCOUNTER — Ambulatory Visit: Payer: BLUE CROSS/BLUE SHIELD

## 2016-12-09 ENCOUNTER — Encounter: Payer: Self-pay | Admitting: Radiation Oncology

## 2016-12-09 NOTE — Progress Notes (Signed)
  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  Beams/energy:   3D  //  10X, 6X  Narrative: The patient was unable to complete radiation treatment. He stated that treatments became somewhat more tolerable as they progressed, but he remained an inpatient and could not finish treatment. Unfortunately, the patient passed away Dec 09, 2016.  Plan: The patient has passed away. It was my pleasure to participate in his care.  ------------------------------------------------  Jodelle Gross, MD, PhD  This document serves as a record of services personally performed by Kyung Rudd, MD. It was created on his behalf by Maryla Morrow, a trained medical scribe. The creation of this record is based on the scribe's personal observations and the provider's statements to them. This document has been checked and approved by the attending provider.

## 2016-12-13 ENCOUNTER — Ambulatory Visit: Payer: BLUE CROSS/BLUE SHIELD

## 2016-12-19 NOTE — Progress Notes (Signed)
IP PROGRESS NOTE  Subjective:  He completed another treatment with irinotecan/cisplatin on 11/29/2016. He tolerated the chemotherapy well. He was able to get out of bed over the weekend. He complains of pain at the sacrum. His daughter is at the bedside.  Objective: Vital signs in last 24 hours: Blood pressure (!) 105/57, pulse (!) 131, temperature 97.9 F (36.6 C), temperature source Axillary, resp. rate 20, height '5\' 3"'$  (1.6 m), weight 101 lb 6.6 oz (46 kg), SpO2 99 %.  Intake/Output from previous day: 01/14 0701 - 01/15 0700 In: -  Out: 300 [Urine:300]  Physical Exam:  HEENT:  the face and neck are swollen, no thrush, firm nodal mass in the low neck bilaterally Lungs:  decreased breath sounds over the right chest, increased respiratory rate Cardiac:  regular rate and rhythm Abdomen:  no hepatomegaly, nontender Extremities:  no leg edema, 1+ edema in both arms   Lab Results:  Recent Labs  12/01/16 0500 12-27-2016 0444  WBC 10.9* 7.5  HGB 8.8* 8.1*  HCT 25.6* 24.1*  PLT 162 152  ANC on 11/28/2016-7.2  BMET  Recent Labs  11/30/16 0436 2016/12/27 0444  NA 127* 131*  K 4.3 4.0  CL 91* 92*  CO2 30 31  GLUCOSE 143* 94  BUN 13 17  CREATININE 0.49* 0.37*  CALCIUM 8.7* 8.8*    Studies/Results: No results found.  Medications: I have reviewed the patient's current medications.  Assessment/Plan:  1.metastatic esophagus cancer-progressive disease in the chest confirmed on CT 11/06/2016    palliative chest radiation and initiated 11/19/2016  [place] weekly irinotecan/cisplatin chemotherapy beginning 11/22/00/2018  2. Admission 11/20/2016 with respiratory failure-likely secondary to tumor progression in the lungs and increased effusions  Palliative left thoracentesis 11/25/2016 3. Chest/back pain secondary to #1 4. Anemia secondary to chronic disease, Chemotherapy, and radiation 5. SVC syndrome secondary to #1 6. Hypotension-likely secondary to #5   Mr. Leppo  appears unchanged. He continues to have respiratory distress and facial/arm edema. I discussed the situation with his daughter. I am concerned his condition will decline despite the radiation and chemotherapy. They would like to complete radiation if possible.  Recommendations: 1. Continue oxygen and morphine for dyspnea 2. Continue palliative radiation to the chest 3. Elevate arms   Addendum: I return to see Mr. Kritikos at approximately 5 PM. A friend was at the bedside. His daughter was not present. He was unable to complete radiation today secondary to dyspnea. I will discuss the situation with his daughter in the a.m. on 12/03/2016. I will recommend hospice care if he is unable to continue radiation.   LOS: 8 days   Betsy Coder, MD   12-27-16, 6:03 PM

## 2016-12-19 NOTE — Discharge Summary (Signed)
Physician Death Summary  Bruce [name] FHL:[id number] DOB: 19-Jun-1961 DOA: 11/26/2016  PCP: No PCP Per Patient  Admit date: 12/10/2016 Discharge date: 12/03/2016  Admitted From: Home Disposition: Morgue   Brief/Interim Summary: Bruce Vath Nguyenis a 56 y.o.malewith a history of metastatic esophageal cancer complicated by loculated R pleural effusion, SVC syndrome and tracheal compression on palliative chemo and XRTwho presented with sudden coughing and dyspnea. This was the day after routine irinotecan/cisplatin infusion. EMS was called and on arrival in ED he was tachycardic into 150's and hypoxemic requiring 5L. Lactate was 6.7, WBC 14.3, Na 126. CXR showed large right effusion, and left effusion, tracheal narrowing. CT PE protocol showed no PEbut did show large loculated right effusion, moderate left effusion, known severe SVC and brachiocephalic narrowing, new severe pulmonary artery narrowing and progressive consolidation in right lung as well as irregular narrowing of right mainstem bronchus. He was given 30 cc/kg bolus, cultures were obtained, and vanc and Zosynwere administered. He was admitted for HCAP. He had therapeutic thoracentesis 1/8 with 900cc fluid return, and required pressor support for hypotension 1/9 and was transferred to [facility] service. Thus far hypotension has marginally improved and pressors have been weaned off as of 1/10. Hypotension was thought to be artifactual due to UE edema. Viral panel grew rhinovirus. Palliative chest XRT had been initiated 11/19/16 and was continued as a trial per oncology to assess response. Plan was to continue this while treating supportively for acute respiratory failure and reassess response. Unfortunately the patient decompensated and died on December 11, 2016. Per pt's wishes, no resuscitative efforts were made.   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 Possibly contribution by aspiration PNA and rhinovirus.  - Abx completed as below - Pulmonology signed off 1/11 - Oxygen by nasal cannula to maintain SpO2 >92%. - Increased dose of mucolytics and continue chest physiotherapy.  - Continue morphine prn air hunger and increased alprazolam to 0.'5mg'$  TID prn anxiety  Sepsis with hypotension, suspect from pneumonia vs. SIRS from advanced malignancy: With evidence of aspiration, will cover pulmonary/anaerobes. Rhinovirus positive. PCT < 0.10. LA normalized, leukocytosis resolved. - Would NOT want to be put back on pressors and confirmed NO CODE BLUE. Cortisol wnl, no indication for steroids. - Ongoing tachycardia from decreased preload due to SVC compression. Medically lowering HR would likely drop output critically.  - Monitor BP on lower extremities.  - Diet and aspiration precautions per SLP - Completed 7 day course of abx    Bilateral large pleural effusions: Reported subjective improvement in dyspnea following thoracentesis 1/8, though CXR appears to be worsening. Complete opacification of right lung. Bedside U/S 1/10 confirmed right effusion not amenable to thoracentesis, not felt to be bronch candidate, certainly not a VATS candidate at this time.  - Monitor  Metastatic esophageal cancer with mediastinal mass causing SVC syndrome:Discussed with Dr. Benay Spice. Plan was to continue XRT daily to measure response in a few days and refer  for home vs.  residential hospice if no improvement. Pt did not show improvement and plan was for hospice referral. Unfortunately the patient decompensated while still in the hospital. - Continue morphine SL or IV prn pain, dyspnea - Continue daily radiation, s/p chemo 1/12 per Dr. Benay Spice. - Possible associated SVC thrombus associated with compression. Risks thought to outweigh benefits of anticoagulation at this time.    Hyponatremia: Chronic, stable. - Monitor BMP - Appears euvolemic, eating well - will DC IVF's  Normocytic anemia:Suspect from chronic disease/cancer - Currently stable  Consultations:  Dr. Benay Spice, oncology  Dr. Halford Chessman, critical care medicine  Dr. Lisbeth Renshaw, radiation oncology  Procedures/Studies: Dg Chest 1 View  Result Date: 11/25/2016 CLINICAL DATA:  Status post thoracentesis EXAM: CHEST 1 VIEW COMPARISON:  12/14/2016 FINDINGS: Mild cardiac enlargement. There is a moderate right pleural effusion with diminished aeration to the right lung. The appearance is similar to previous exam. Left pleural effusion has resolved in the interval. IMPRESSION: 1. Persistent moderate right pleural effusion. 2. Decrease in left effusion. Electronically Signed   By: Kerby Moors M.D.   On: 11/25/2016 10:43   Dg Abd 1 View  Result Date: 11/04/2016 CLINICAL DATA:  Abdominal pain. EXAM: ABDOMEN - 1 VIEW COMPARISON:  No recent prior . FINDINGS: Soft tissue structures are unremarkable. No bowel distention. Stool noted throughout the colon. A button noted projected over the right abdomen, this may be extrinsic to the patient. No acute bony abnormality . IMPRESSION: No acute or focal abnormality. Electronically Signed   By: Marcello Moores  Register   On: 11/04/2016 16:28   Ct Chest W Contrast  Result Date: 11/06/2016 CLINICAL DATA:  Squamous cell carcinoma of the lower third of the esophagus diagnosed in July 2013 status post esophagectomy 08/26/2012, with metastatic pulmonary disease status post right upper  lobe wedge resection and radiation therapy with ongoing chemotherapy, presenting for restaging. EXAM: CT CHEST WITH CONTRAST TECHNIQUE: Multidetector CT imaging of the chest was performed during intravenous contrast administration. CONTRAST:  53m ISOVUE-300 IOPAMIDOL (ISOVUE-300) INJECTION 61% COMPARISON:  07/29/2016 chest CT. FINDINGS: Motion degraded scan. Cardiovascular: Normal heart size. No significant pericardial fluid/thickening. Mildly atherosclerotic nonaneurysmal thoracic aorta. Normal caliber main pulmonary artery. No central pulmonary emboli. Mediastinum/Nodes: No discrete thyroid nodules. Status post esophagectomy and gastric pull-through with intact appearing esophagogastric anastomosis at the level of the thoracic inlet. Newly enlarged right axillary nodes measuring up to 1.2 cm (series 2/ image 51). No left axillary adenopathy. Newly enlarged 1.4 cm right supraclavicular node (series 2/image 7). Enlarged left supraclavicular 2.6 cm node (series 2/image 7), increased from 1.6 cm. Confluent infiltrative right prevascular/ right paratracheal mediastinal lymphadenopathy measuring up to 4.8 x 3.5 cm (series 2/image 48), increased from 4.4 x 2.7 cm, with associated new high-grade extrinsic narrowing of the SVC (series 2/ image 47). There is new contrast-enhancement of innumerable anterior and left mediastinal and bilateral paraspinal venous collaterals draining to the portal venous system in the upper abdomen. Enlarged 2.5 cm left prevascular mediastinal node (series 2/ image 48), increased from 2.0 cm. Infiltrative AP window/left lower paratracheal 2.6 cm node (series 2/ image 53), previously 2.5 cm, minimally increased. Enlarged 1.6 cm right hilar node (series 2/image 60), increased from 1.2 cm. Left hilar adenopathy measuring up to 1.6 cm (series 2/ image 58), increased from 1.0 cm. Mildly enlarged 1.5 cm subcarinal node (series 2/ image 61), slightly increased from 1.4 cm. Lungs/Pleura: No  pneumothorax. Moderate loculated predominantly basilar right pleural effusion is stable in size and appearance with  diffuse right pleural thickening and heterogeneous internal density within the right pleural effusion. New small layering left pleural effusion. Status post posterior right upper lobe wedge resection. Mild-to-moderate centrilobular emphysema. At least 6 new scattered solid pulmonary nodules throughout both lungs, largest 1.0 cm in the apical right upper lobe (series 5/image 40). Previously described left lower lobe 5 mm and 3 mm pulmonary nodules (series 5/ images 79 and 77) appear stable. Patchy subpleural reticulation throughout both lungs has not appreciably changed. Worsening volume loss and consolidation in the right lower lobe, favor a combination of compressive and rounded atelectasis. Upper abdomen: No discrete adrenal nodules. Musculoskeletal: Mild thoracic spondylosis. New patchy sclerosis throughout the visualized lower cervical and thoracic vertebral bodies, predominantly in the posterior portion of the vertebral bodies, probably representing venous collateral enhancement given the SVC obstruction. IMPRESSION: 1. Interval growth of infiltrative right prevascular/right paratracheal mediastinal nodal metastases, with associated new high-grade extrinsic SVC obstruction. 2. Interval growth of bilateral supraclavicular, left prevascular, AP window, subcarinal and bilateral hilar nodal metastases. 3. New right hilar adenopathy, which could represent metastatic disease versus congestion from SVC obstruction. 4. Several (at least 6) new solid pulmonary nodules scattered throughout both lungs measuring up to 1.0 cm in the apical right upper lobe, most consistent with new pulmonary metastases. 5. Stable moderate loculated right pleural effusion with diffuse right pleural thickening. New small left pleural effusion. 6. Aortic atherosclerosis. Electronically Signed   By: Ilona Sorrel M.D.   On:  11/06/2016 10:52   Ct Angio Chest Pe W Or Wo Contrast  Result Date: 11/24/2016 CLINICAL DATA:  Increased shortness of breath history of esophageal cancer with lung metastases EXAM: CT ANGIOGRAPHY CHEST WITH CONTRAST TECHNIQUE: Multidetector CT imaging of the chest was performed using the standard protocol during bolus administration of intravenous contrast. Multiplanar CT image reconstructions and MIPs were obtained to evaluate the vascular anatomy. CONTRAST:  80 mL Isovue 370 intravenous COMPARISON:  Chest x-ray 12/09/2016, CT chest 11/06/2016 FINDINGS: Cardiovascular: Non aneurysmal aorta. No dissection is seen. Numerous collateral vessels within the left chest wall and paraspinous area. Severe narrowing of the brachiocephalic vessels and SVC due to mediastinal mass. Suspect thrombus within the distal SVC just above the right atrium. Severe diffuse narrowing/ encasement of the right pulmonary artery by mediastinal mass. Marked narrowing of right upper lobe pulmonary arteries with minimal distal enhancement noted. No gross embolus identified within the right lower lobe pulmonary arteries. Mild narrowing of the left pulmonary artery by a mediastinal mass. No gross central embolus identified on the left. Heart size nonenlarged. No gross effusion Mediastinum/Nodes: Ill-defined infiltrative soft tissue mass/adenopathy filling the mediastinum. The mass surrounds the great vessels of the aortic arch and the trachea with irregular narrowing of the right bronchi. Mass encases the central right greater than left pulmonary arteries. Left hilar adenopathy is again visualized. Patient is status post esophagectomy with gastric pull-through. Multiple enlarged right axillary nodes. Lungs/Pleura: Large bilateral pleural effusions, increased compared to prior. Mild rim enhancement around the right pleural collection. Emphasis mid disc disease is present. Multiple lung nodules again visualized consistent with metastatic disease.  Increasing consolidation within the right lower lobe. No pneumothorax Upper Abdomen: Postsurgical changes of the stomach. Heterogenous enhancement of the liver. Spleen grossly unremarkable. Musculoskeletal: Stable skeletal structures with patchy areas of sclerosis within lower thoracic vertebra. Patchy sclerosis within upper thoracic and lower cervical vertebra part of which is related to vascular enhancement. Review of the MIP images confirms the above findings. IMPRESSION: 1. No gross central  embolus is visualized. There is severe narrowing of the right pulmonary artery by mediastinal mass with severe narrowing and attenuation of the right upper lobe pulmonary arterial branches. 2. Severe narrowing of the brachiocephalic vessels and SVC by mediastinal tumor. Suspect that there is a small amount of thrombus in the distal SVC above the right atrium. Numerous paraspinal, mediastinal and left chest wall collaterals are again visualized. 3. Large bilateral pleural effusions, increased on the left compared to prior. Right pleural collection is loculated. Progressive consolidation within the right lung may reflect atelectasis or pneumonia. Multiple pulmonary nodules consistent with metastatic disease, some of which are now obscured by increased consolidation. 4. Left hilar adenopathy and right axillary adenopathy as before. Large infiltrative mediastinal mass/ adenopathy, grossly unchanged. Mild irregular narrowing of the right mainstem bronchus by mediastinal mass. 5. Stable sclerosis within the cervical and thoracic spine, some of the findings in the cervical spine and upper thoracic vertebra are felt related to prominent venous enhancement. Electronically Signed   By: Donavan Foil M.D.   On: 11/24/2016 03:12   Dg Chest Port 1 View  Result Date: 11/28/2016 CLINICAL DATA:  56 year old male with respiratory failure, sepsis, esophageal cancer with lung metastases. Initial encounter. EXAM: PORTABLE CHEST 1 VIEW  COMPARISON:  11/27/2016 portable chest and earlier. Speech pathology swallow study 11/27/2016. FINDINGS: Portable AP semi upright view at 0438 hours. There is a small volume of residual barium from the swallow study yesterday now projecting in the epigastrium and also along the midline of the chest, and the latter might be within the esophagus or trachea. Continued near complete white out of the right lung, only trace gas visible in the right apex and about the right hilum. Continued confluent retrocardiac opacity on the left compatible with lower lobe collapse or consolidation. No pneumothorax. No pulmonary edema. Visible mediastinal contours are stable. IMPRESSION: 1. Continued subtotal opacification of the right hemithorax likely representing a combination of large pleural collection and consolidated or drowned right lung in this setting. 2. Stable left lung ventilation with lower lobe collapse or consolidation. 3. Small volume of retained barium from the speech pathology swallow study yesterday. Electronically Signed   By: Genevie Ann M.D.   On: 11/28/2016 06:54   Dg Chest Port 1 View  Result Date: 11/27/2016 CLINICAL DATA:  Pleural effusion EXAM: PORTABLE CHEST 1 VIEW COMPARISON:  11/25/2016 FINDINGS: Complete opacification of the right hemithorax compatible with right lung collapse and progressive effusion. Progressive left lower lobe airspace disease and small left effusion compared with the prior study. Negative for heart failure. IMPRESSION: There is now complete opacification of the right hemithorax compatible with enlarging right effusion and right lung collapse Progression of left lower lobe atelectasis/ infiltrate and small left effusion. Electronically Signed   By: Franchot Gallo M.D.   On: 11/27/2016 06:47   Dg Chest Port 1 View  Result Date: 11/24/2016 CLINICAL DATA:  Worsening dyspnea tonight. EXAM: PORTABLE CHEST 1 VIEW COMPARISON:  09/05/2016, 11/06/2016 FINDINGS: Right pleural effusion is  probably unchanged from the CT of 11/06/2016. Right hilar opacity corresponds to the infiltrative mass in the medial right lung on recent CT. There also is a small to moderate left pleural effusion which is also probably enlarged. Pulmonary vasculature is normal. There is narrowing of the tracheal air column at the upper mediastinum and thoracic inlet. This probably is due to nodal disease in the mediastinum. IMPRESSION: 1. Bilateral pleural effusions probably larger on the left compared to the recent prior studies. Known  infiltrative mass in the medial right lung is probably unchanged. 2. Narrow transverse diameter of the tracheal air column, new from 09/05/2016 and possibly worsened from 11/06/2016. This probably is due to mediastinal nodal disease. Electronically Signed   By: Andreas Newport M.D.   On: 11/24/2016 00:21   Dg Swallowing Func-speech Pathology  Result Date: 11/27/2016 Objective Swallowing Evaluation: Type of Study: MBS-Modified Barium Swallow Study Patient Details Name: Shaquel Chavous MRN: [id number] Date of Birth: 01/20/1961 Today's Date: 11/27/2016 Time: SLP Start Time (ACUTE ONLY): 1320-SLP Stop Time (ACUTE ONLY): 1340 SLP Time Calculation (min) (ACUTE ONLY): 20 min Past Medical History: Past Medical History: Diagnosis Date . Cancer ([place]) 05/28/12  bx=esophagus=squamous cell carcinoma . Dysphasia   solid and liquid . Esophagitis  . Lung cancer ([place]) 12/27/13  right upper lung . Mass of esophagus 05/28/2012  BX'D DISTAL ESOPHAGUS,PENDING . Neuropathy ([place])   right hand numbness 1 year 2012 Past Surgical History: Past Surgical History: Procedure Laterality Date . COMPLETE ESOPHAGECTOMY  08/26/2012  Procedure: ESOPHAGECTOMY COMPLETE;  Surgeon: [name], MD;  [facility] OR;  Service: Thoracic;  Laterality: N/A;  transhiatal  . 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 EUS  06/11/2012  Procedure: UPPER ENDOSCOPIC  ULTRASOUND (EUS) RADIAL;  Surgeon: Milus Banister, MD;  Location: [facility] ENDOSCOPY;  Service: Endoscopy;  Laterality: N/A; . JEJUNOSTOMY  08/26/2012  Procedure: JEJUNOSTOMY;  Surgeon: [name], MD;  Location: [place];  Service: Thoracic;  Laterality: N/A;  placement of feeding jujunostomy tube . LYMPH NODE DISSECTION Right 01/31/2014  Procedure: LYMPH NODE DISSECTION;  Surgeon: [name], MD;  Location: [place];  Service: Thoracic;  Laterality: Right; . PYLOROPLASTY  08/26/2012  Procedure: PYLOROPLASTY;  Surgeon: [name], MD;  Location: [place];  Service: Thoracic;  Laterality: N/A; . VIDEO ASSISTED THORACOSCOPY (VATS)/WEDGE RESECTION Right 01/31/2014  Procedure: VIDEO ASSISTED THORACOSCOPY (VATS)/WEDGE RESECTION; with insertion of On Q pain pump;  Surgeon: [name], MD;  Location: [place];  Service: Thoracic;  Laterality: Right;  with insertion of On Q pain pump . VIDEO BRONCHOSCOPY  08/26/2012  Procedure: VIDEO BRONCHOSCOPY;  Surgeon: [name], MD;  Location: [place];  Service: Thoracic;  Laterality: N/A; . VIDEO BRONCHOSCOPY N/A 01/31/2014  Procedure: VIDEO BRONCHOSCOPY;  Surgeon: [name], MD;  Location: [facility] OR;  Service: Thoracic;  Laterality: N/A; HPI: 56 year old non-English speaking male admitted 12/09/2016 due to coughing, choking and difficulty breathing.  PMH significant for metastatic esophageal CA (dx 2013), SVC syndrome, tracheal compression, lung mets, esophagectomy (2014). Barium Swallow was completed 02/2016, which revealed flash penetration, no hypopharyngeal stricture, no aspiration.  Subjective: friend present for translation Assessment / Plan / Recommendation CHL IP CLINICAL IMPRESSIONS 11/27/2016 Therapy Diagnosis Mild oral phase dysphagia;Severe pharyngeal phase dysphagia Clinical Impression Pt presents with mild oral, severe pharyngeal dysphagia, characterized orally by piecemeal deglutition of honey and puree consistencies. Pharyngeally, pt exhibits delayed  swallow reflex with trigger at the level of the vallecular sinus across consistencies. Additionally, trace post-swallow vallecular residue is seen across consistencies, which pt is able to clear with cued dry swallow. Of primary concern is pt's gross aspiration of thin and nectar thick liquids with no cough response (SILENT ASPIRATION). Cued cough is weak and ineffective to remove aspirate. Pt heart rate was noted to become elevated as the study progressed, possibly indicating significance of the effect of aspiration. Pt's friend was present during this  study to facilitate communication, and he reported pt feels tired after drinking liquids, likely due to aspiration. At this time, puree diet and honey thick liquid via teaspoon is recommended, with smaller more frequent meals for energy conservation. Meds should be crushed in puree, and no liquids (INCLUDING SOUPS) should be given unless thickened to honey consistency. ST will follow pt acutely for assessment of diet tolerance and education regarding safe swallow precautions.  Impact on safety and function Moderate aspiration risk;Severe aspiration risk   CHL IP TREATMENT RECOMMENDATION 11/27/2016 Treatment Recommendations Therapy as outlined in treatment plan below   Prognosis 11/27/2016 Prognosis for Safe Diet Advancement Guarded CHL IP DIET RECOMMENDATION 11/27/2016 SLP Diet Recommendations Dysphagia 1 (Puree) solids;Honey thick liquids Medication Administration Crushed with puree Compensations Minimize environmental distractions;Slow rate;Small sips/bites;Multiple dry swallows after each bite/sip Postural Changes Remain semi-upright after after feeds/meals (Comment);Seated upright at 90 degrees   CHL IP OTHER RECOMMENDATIONS 11/27/2016 Oral Care Recommendations Oral care QID;Staff/trained caregiver to provide oral care Other Recommendations Order thickener from pharmacy;Have oral suction available   CHL IP FOLLOW UP RECOMMENDATIONS 11/26/2016 Follow up Recommendations  F/u ST at next venue   CHL IP FREQUENCY AND DURATION 11/27/2016 Speech Therapy Frequency (ACUTE ONLY) min 2x/week Treatment Duration 2 weeks      CHL IP ORAL PHASE 11/27/2016 Oral Phase Impaired Oral - Honey Teaspoon Piecemeal swallowing Oral - Puree Piecemeal swallowing  CHL IP PHARYNGEAL PHASE 11/27/2016 Pharyngeal Phase Impaired Pharyngeal- Honey Teaspoon Delayed swallow initiation-vallecula;Reduced tongue base retraction;Pharyngeal residue - valleculae Pharyngeal- Nectar Cup Delayed swallow initiation-vallecula;Reduced airway/laryngeal closure;Penetration/Aspiration before swallow;Reduced tongue base retraction;Pharyngeal residue - valleculae Pharyngeal Material enters airway, passes BELOW cords without attempt by patient to eject out (silent aspiration) Pharyngeal- Thin Teaspoon Delayed swallow initiation-vallecula;Reduced airway/laryngeal closure;Penetration/Aspiration before swallow;Pharyngeal residue - valleculae;Reduced tongue base retraction Pharyngeal Material enters airway, passes BELOW cords without attempt by patient to eject out (silent aspiration) Pharyngeal- Puree Delayed swallow initiation-vallecula;Reduced tongue base retraction;Pharyngeal residue - valleculae  CHL IP CERVICAL ESOPHAGEAL PHASE 11/27/2016 Cervical Esophageal Phase [facility] Celia B. [place], [facility], CCC-SLP [phone] Shonna Chock 11/27/2016, 2:15 PM    US Thoracentesis Asp Pleural Space W/img Guide  Result Date: 11/25/2016 INDICATION: Patient with history of metastatic esophageal cancer, bilateral pleural effusions left greater than right. Request made for diagnostic and therapeutic left thoracentesis. EXAM: ULTRASOUND GUIDED DIAGNOSTIC AND THERAPEUTIC LEFT THORACENTESIS MEDICATIONS: None. COMPLICATIONS: None immediate. PROCEDURE: An ultrasound guided thoracentesis was thoroughly discussed with the patient and questions answered. The benefits, risks, alternatives and complications were also discussed. The patient understands and wishes  to proceed with the procedure. Written consent was obtained. Ultrasound was performed to localize and mark an adequate pocket of fluid in the left chest. The area was then prepped and draped in the normal sterile fashion. 1% Lidocaine was used for local anesthesia. Under ultrasound guidance a Safe-T-Centesis catheter was introduced. Thoracentesis was performed. The catheter was removed and a dressing applied. FINDINGS: A total of approximately 910 cc of yellow fluid was removed. Samples were sent to the laboratory as requested by the clinical team. Patient's right pleural effusion was extensively multiloculated, therefore decision made to tap larger left effusion at this time. IMPRESSION: Successful ultrasound guided diagnostic and therapeutic left thoracentesis yielding 910 cc of pleural fluid. Read by: Rowe Robert, PA-C Electronically Signed   By: Sandi Mariscal M.D.   On: 11/25/2016 11:39     Subjective: N/A  Discharge Exam: Vitals:   12-18-16 0557 18-Dec-2016 1430  BP: (!) 91/57 (!) 105/57  Pulse: Marland Kitchen)  154 (!) 131  Resp: 16 20  Temp: 97.8 F (36.6 C) 97.9 F (36.6 C)   Vitals:   12/18/2016 1430 12-18-2016 1437 12-18-2016 1736 12/03/16 0300  BP: (!) 105/57     Pulse: (!) 131     Resp: 20     Temp: 97.9 F (36.6 C)     TempSrc: Axillary     SpO2: 98% 100% 99%   Weight:    45.8 kg (101 lb)  Height:       General: N/A  Labs: BNP (last 3 results)  Recent Labs  11/24/16 0110  BNP 30.8   Basic Metabolic Panel:  Recent Labs Lab 11/27/16 0402 11/28/16 0325 11/29/16 0349 11/30/16 0436 2016-12-18 0444  NA 128* 126* 131* 127* 131*  K 4.0 3.6 3.7 4.3 4.0  CL 93* 92* 94* 91* 92*  CO2 '29 27 31 30 31  '$ GLUCOSE 116* 100* 90 143* 94  BUN '15 17 16 13 17  '$ CREATININE 0.55* 0.45* 0.39* 0.49* 0.37*  CALCIUM 8.9 8.9 9.0 8.7* 8.8*  MG  --   --  2.0  --   --    Liver Function Tests:  Recent Labs Lab 11/27/16 0402  AST 39  ALT 56  ALKPHOS 166*  BILITOT 0.6  PROT 6.4*  ALBUMIN 2.7*    CBC:  Recent Labs Lab 11/28/16 0325 11/29/16 0349 11/30/16 0436 12/01/16 0500 2016-12-18 0444  WBC 7.1 6.2 5.2 10.9* 7.5  NEUTROABS 7.2  --   --   --   --   HGB 8.6* 8.1* 9.0* 8.8* 8.1*  HCT 25.6* 23.7* 26.7* 25.6* 24.1*  MCV 79.5 81.2 80.9 81.0 82.5  PLT 185 165 150 162 152   Urinalysis    Component Value Date/Time   COLORURINE YELLOW 11/24/2016 [place] 11/24/2016 0333   LABSPEC 1.020 11/24/2016 0333   LABSPEC 1.005 07/29/2014 1543   PHURINE 5.0 11/24/2016 0333   GLUCOSEU 100 (A) 11/24/2016 0333   GLUCOSEU Negative 07/29/2014 1543   HGBUR SMALL (A) 11/24/2016 0333   BILIRUBINUR NEGATIVE 11/24/2016 0333   BILIRUBINUR Negative 07/29/2014 1543   KETONESUR NEGATIVE 11/24/2016 0333   PROTEINUR NEGATIVE 11/24/2016 0333   UROBILINOGEN 0.2 07/29/2014 1543   NITRITE NEGATIVE 11/24/2016 0333   LEUKOCYTESUR NEGATIVE 11/24/2016 0333   LEUKOCYTESUR Negative 07/29/2014 1543   Microbiology Recent Results (from the past 240 hour(s))  Respiratory Panel by PCR     Status: Abnormal   Collection Time: 11/24/16  1:30 AM  Result Value Ref Range Status   Adenovirus NOT DETECTED NOT DETECTED Final   Coronavirus 229E NOT DETECTED NOT DETECTED Final   Coronavirus HKU1 NOT DETECTED NOT DETECTED Final   Coronavirus NL63 NOT DETECTED NOT DETECTED Final   Coronavirus OC43 NOT DETECTED NOT DETECTED Final   Metapneumovirus NOT DETECTED NOT DETECTED Final   Rhinovirus / Enterovirus DETECTED (A) NOT DETECTED Final   Influenza A NOT DETECTED NOT DETECTED Final   Influenza B NOT DETECTED NOT DETECTED Final   Parainfluenza Virus 1 NOT DETECTED NOT DETECTED Final   Parainfluenza Virus 2 NOT DETECTED NOT DETECTED Final   Parainfluenza Virus 3 NOT DETECTED NOT DETECTED Final   Parainfluenza Virus 4 NOT DETECTED NOT DETECTED Final   Respiratory Syncytial Virus NOT DETECTED NOT DETECTED Final   Bordetella pertussis NOT DETECTED NOT DETECTED Final   Chlamydophila pneumoniae NOT  DETECTED NOT DETECTED Final   Mycoplasma pneumoniae NOT DETECTED NOT DETECTED Final    Comment: Performed at [facility]  Blood Culture (routine x 2)     Status: None   Collection Time: 11/24/16  2:10 AM  Result Value Ref Range Status   Specimen Description BLOOD LEFT HAND  Final   Special Requests BOTTLES DRAWN AEROBIC AND ANAEROBIC 5ML  Final   Culture   Final    NO GROWTH 5 DAYS Performed at [facility]    Report Status 11/30/2016 FINAL  Final  Blood Culture (routine x 2)     Status: None   Collection Time: 11/24/16  2:12 AM  Result Value Ref Range Status   Specimen Description BLOOD LEFT HAND  Final   Special Requests BOTTLES DRAWN AEROBIC AND ANAEROBIC 5ML  Final   Culture   Final    NO GROWTH 5 DAYS Performed at [facility]    Report Status 11/30/2016 FINAL  Final  Urine culture     Status: None   Collection Time: 11/24/16  3:33 AM  Result Value Ref Range Status   Specimen Description URINE, CLEAN CATCH  Final   Special Requests NONE  Final   Culture NO GROWTH Performed at [facility]   Final   Report Status 11/25/2016 FINAL  Final  MRSA PCR Screening     Status: None   Collection Time: 11/24/16  5:41 AM  Result Value Ref Range Status   MRSA by PCR NEGATIVE NEGATIVE Final    Comment:        The GeneXpert MRSA Assay (FDA approved for NASAL specimens only), is one component of a comprehensive MRSA colonization surveillance program. It is not intended to diagnose MRSA infection nor to guide or monitor treatment for MRSA infections.   Culture, body fluid-bottle     Status: None   Collection Time: 11/25/16 10:27 AM  Result Value Ref Range Status   Specimen Description PLEURAL  Final   Special Requests NONE  Final   Culture   Final    NO GROWTH 5 DAYS Performed at [facility]    Report Status 11/30/2016 FINAL  Final  Gram stain     Status: None   Collection Time: 11/25/16 10:27 AM  Result Value Ref Range Status    Specimen Description PLEURAL  Final   Special Requests NONE  Final   Gram Stain   Final    FEW WBC PRESENT, PREDOMINANTLY MONONUCLEAR NO ORGANISMS SEEN Performed at [facility]    Report Status 11/25/2016 FINAL  Final    Vance Gather, MD  [facility] 12/03/2016, 7:33 AM Pager [phone]

## 2016-12-19 NOTE — Progress Notes (Addendum)
Family called nurse to bedside stating patients oxygen dropped O2 sat 85% patient and family are very anxious. Nurse encouraged family to reassure patient and asked patient to take slow deep breathes. Patients O2 sat increased to 93% with less anxiety noted. Nurse walked up the hallway to return with another RN at bedside. Patient is now having agonal breathing with a faint heartbeat. Family at bedside and made aware that patient is actively dying. Family given time at bedside and nurse stepped away to page MD and make oncoming nurse aware.

## 2016-12-19 NOTE — Progress Notes (Signed)
Patient had difficulty attempting to swallow oral meds mucinex given oral as requested per pt and family but pt. Ad a lot of difficulty swallowing. No further attempts for oral meds this shift. Dr Benay Spice made aware while he was on the unit.

## 2016-12-19 NOTE — Progress Notes (Signed)
Marta Lamas RN  and Dolores Patty RN at bedside and patient is found to have no pulse and no breathes. Family continues to be at bedside and made aware. On call MD paged and oncoming nurse will make MD aware of death.

## 2016-12-19 NOTE — Progress Notes (Signed)
PROGRESS NOTE  Fabricio Endsley  VZD:[id number] DOB: 19-Feb-1961 DOA: 11/30/2016 PCP: No PCP Per Patient  Outpatient Specialists: Oncology, Dr. Benay Spice  Brief Narrative: Harvin Konicek Pitter is a 56 y.o. male with a history of metastatic esophageal cancer complicated by loculated R pleural effusion, SVC syndrome and tracheal compression on palliative chemo and XRT who presented with sudden coughing and dyspnea. This was the day after routine irinotecan/cisplatin infusion. EMS was called and on arrival in ED he was tachycardic into 150's and hypoxemic requiring 5L. Lactate was 6.7, WBC 14.3, Na 126. CXR showed large right effusion, and left effusion, tracheal narrowing. CT PE protocol showed no PE but did show large loculated right effusion, moderate left effusion, known severe SVC and brachiocephalic narrowing, new severe pulmonary artery narrowing and progressive consolidation in right lung as well as irregular narrowing of right mainstem bronchus. He was given 30 cc/kg bolus, cultures were obtained, and vanc and Zosyn were administered. He was admitted for HCAP. He had therapeutic thoracentesis 1/8 with 900cc fluid return, and required pressor support for hypotension 1/9 and was transferred to [facility] service. Thus far hypotension has marginally improved and pressors have been weaned off as of 1/10. Hypotension was thought to be artifactual due to UE edema. Viral panel grew rhinovirus. Palliative chest XRT had been initiated 11/19/16 and was continued as a trial per oncology to assess response. Plan is to continue this while treating supportively for acute respiratory failure and reassess response. Will discharge with home hospice if no appreciable improvement with XRT.   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 Possibly contribution by aspiration PNA and rhinovirus.  - Abx completed as below - Pulmonology signed off 1/11 - Oxygen by nasal cannula to maintain SpO2 >92%. Has subjective dyspnea more prominently than hypoxemia. Worse at night. - Increase dose of mucolytics and continue chest physiotherapy.  - Continue morphine prn air hunger and increased alprazolam to 0.'5mg'$  TID prn anxiety  Sepsis with hypotension, suspect from pneumonia vs. SIRS from advanced malignancy: With evidence of aspiration, will cover pulmonary/anaerobes. Rhinovirus positive. PCT < 0.10. LA normalized, leukocytosis resolved. - Would NOT want to be put back on pressors and confirmed NO CODE BLUE. Cortisol wnl, no indication for steroids. - Ongoing tachycardia from decreased preload due to SVC compression. Will check echo. Medically lowering HR would likely drop output critically.  - Monitor BP on lower extremities.  - Diet and aspiration precautions per SLP - Completed 7 day course of abx    Bilateral large pleural effusions: Reported subjective improvement in dyspnea following thoracentesis 1/8, though CXR appears to be worsening. Complete opacification of right lung. Bedside U/S 1/10 confirmed right effusion not amenable to thoracentesis, not felt to be bronch candidate, certainly not a VATS candidate at this time.  - Monitor  Metastatic esophageal cancer with mediastinal mass causing SVC syndrome:Discussed with Dr. Benay Spice. Plan is to continue XRT daily to measure response in  a few days and refer for home vs. residential hospice if no  improvement. Pt not improving, plan to start process of hospice pending further discussion with Dr. Benay Spice today. - Continue morphine SL or IV prn pain, dyspnea - Continue daily radiation, s/p chemo 1/12 per Dr. Benay Spice. - Possible associated SVC thrombus associated with compression. Risks thought to outweigh benefits of anticoagulation at this time.    Hyponatremia: Chronic, stable. - Monitor BMP - Appears euvolemic, eating well - will DC IVF's  Normocytic anemia: -Suspect from chronic disease/cancer -Currently stable  DVT prophylaxis: Lovenox Code Status: DNR, no pressors Family Communication: Family friend and daughter at bedside who acts as interpretor due to patient again refusing telephone interpretation services. Disposition Plan: Continue treatment on med-surg onc floor. Disposition pending clinical course.   Consultants:   Pulmonology, Dr. Halford Chessman  Oncology, Dr. Benay Spice  Radiation oncology, Dr. Lisbeth Renshaw  Procedures:  CT angio chest 1/07 >> mediastinal mass with narrowing of Rt PA and SVC, large B/L effusions increased on Lt and loculated on Rt, RLL consolidation, multiple nodules Lt thoracentesis 1/08 >> 910 ml yellow fluid, glucose 111, protein 3.4, LDH 195, WBC 915 (81%L)  Antimicrobials: Vancomycin 1/06 >> 1/06 Zosyn 1/06 >> 1/06 Rocephin 1/07 >> 1/11 Zithromax 1/07 >> 1/11 Zosyn 1/11 >> 1/13  Subjective: Pt appeared more comfortable today, but had some dyspnea last night improved with prn's. He complains of sore lower back due to sitting in bed constantly. Overall, we discussed he is not showing any improvement.   Objective: Vitals:   12/01/16 1700 12/01/16 2106 12-29-16 0557 December 29, 2016 0804  BP: (!) 90/57 128/66 (!) 91/57   Pulse: (!) 112 (!) 158 (!) 154   Resp: '20 18 16   '$ Temp: 97.7 F (36.5 C) 97.5 F (36.4 C) 97.8 F (36.6 C)   TempSrc: Axillary Oral Oral   SpO2: 100% 96% 97% 93%  Weight:      Height:        Intake/Output Summary (Last 24 hours)  at December 29, 2016 0901 Last data filed at 12/01/16 1200  Gross per 24 hour  Intake                0 ml  Output              300 ml  Net             -300 ml   Filed Weights   11/27/16 0330 11/29/16 0800  Weight: 44.7 kg (98 lb 8.7 oz) 46 kg (101 lb 6.6 oz)    Examination: General exam: 56 y.o. male in apparent discomfort but no respiratory distress Respiratory system: Nonlabored with normal rate this AM with supplemental oxygen. Absent R side breath sounds and diminished with crackles at left at mid and lower lung zones. Cardiovascular system: Regular rate and rhythm. No murmur, rub, or gallop. + JVD, and no pedal edema. Facial swelling and plethora. UE's markedly edematous.  Gastrointestinal system: Abdomen soft, non-tender, non-distended, with normoactive bowel sounds. No organomegaly or masses felt. Central nervous system: Alert and oriented. No focal neurological deficits. Extremities: Warm, no deformities Skin: Telangiectasias across upper chest. No other lesions noted. Psychiatry: Judgement and insight appear normal. Mood & affect appropriate.   Data Reviewed: I have personally reviewed following labs and imaging studies  CBC:  Recent Labs Lab 11/28/16 0325 11/29/16 0349 11/30/16 0436 12/01/16 0500 12-29-16 0444  WBC 7.1 6.2 5.2 10.9* 7.5  NEUTROABS 7.2  --   --   --   --  HGB 8.6* 8.1* 9.0* 8.8* 8.1*  HCT 25.6* 23.7* 26.7* 25.6* 24.1*  MCV 79.5 81.2 80.9 81.0 82.5  PLT 185 165 150 162 469   Basic Metabolic Panel:  Recent Labs Lab 11/27/16 0402 11/28/16 0325 11/29/16 0349 11/30/16 0436 18-Dec-2016 0444  NA 128* 126* 131* 127* 131*  K 4.0 3.6 3.7 4.3 4.0  CL 93* 92* 94* 91* 92*  CO2 '29 27 31 30 31  '$ GLUCOSE 116* 100* 90 143* 94  BUN '15 17 16 13 17  '$ CREATININE 0.55* 0.45* 0.39* 0.49* 0.37*  CALCIUM 8.9 8.9 9.0 8.7* 8.8*  MG  --   --  2.0  --   --    GFR: Estimated Creatinine Clearance: 68.7 mL/min (by C-G formula based on SCr of 0.37 mg/dL (L)). Liver Function  Tests:  Recent Labs Lab 11/27/16 0402  AST 39  ALT 56  ALKPHOS 166*  BILITOT 0.6  PROT 6.4*  ALBUMIN 2.7*   No results for input(s): LIPASE, AMYLASE in the last 168 hours. No results for input(s): AMMONIA in the last 168 hours. Coagulation Profile: No results for input(s): INR, PROTIME in the last 168 hours. Cardiac Enzymes: No results for input(s): CKTOTAL, CKMB, CKMBINDEX, TROPONINI in the last 168 hours. BNP (last 3 results) No results for input(s): PROBNP in the last 8760 hours. HbA1C: No results for input(s): HGBA1C in the last 72 hours. CBG: No results for input(s): GLUCAP in the last 168 hours. Lipid Profile: No results for input(s): CHOL, HDL, LDLCALC, TRIG, CHOLHDL, LDLDIRECT in the last 72 hours. Thyroid Function Tests: No results for input(s): TSH, T4TOTAL, FREET4, T3FREE, THYROIDAB in the last 72 hours. Anemia Panel: No results for input(s): VITAMINB12, FOLATE, FERRITIN, TIBC, IRON, RETICCTPCT in the last 72 hours. Urine analysis:    Component Value Date/Time   COLORURINE YELLOW 11/24/2016 [place] 11/24/2016 0333   LABSPEC 1.020 11/24/2016 0333   LABSPEC 1.005 07/29/2014 1543   PHURINE 5.0 11/24/2016 0333   GLUCOSEU 100 (A) 11/24/2016 0333   GLUCOSEU Negative 07/29/2014 1543   HGBUR SMALL (A) 11/24/2016 0333   BILIRUBINUR NEGATIVE 11/24/2016 0333   BILIRUBINUR Negative 07/29/2014 1543   KETONESUR NEGATIVE 11/24/2016 0333   PROTEINUR NEGATIVE 11/24/2016 0333   UROBILINOGEN 0.2 07/29/2014 1543   NITRITE NEGATIVE 11/24/2016 0333   LEUKOCYTESUR NEGATIVE 11/24/2016 0333   LEUKOCYTESUR Negative 07/29/2014 1543   Sepsis Labs: '@LABRCNTIP'$ (procalcitonin:4,lacticidven:4)  ) Recent Results (from the past 240 hour(s))  Respiratory Panel by PCR     Status: Abnormal   Collection Time: 11/24/16  1:30 AM  Result Value Ref Range Status   Adenovirus NOT DETECTED NOT DETECTED Final   Coronavirus 229E NOT DETECTED NOT DETECTED Final   Coronavirus  HKU1 NOT DETECTED NOT DETECTED Final   Coronavirus NL63 NOT DETECTED NOT DETECTED Final   Coronavirus OC43 NOT DETECTED NOT DETECTED Final   Metapneumovirus NOT DETECTED NOT DETECTED Final   Rhinovirus / Enterovirus DETECTED (A) NOT DETECTED Final   Influenza A NOT DETECTED NOT DETECTED Final   Influenza B NOT DETECTED NOT DETECTED Final   Parainfluenza Virus 1 NOT DETECTED NOT DETECTED Final   Parainfluenza Virus 2 NOT DETECTED NOT DETECTED Final   Parainfluenza Virus 3 NOT DETECTED NOT DETECTED Final   Parainfluenza Virus 4 NOT DETECTED NOT DETECTED Final   Respiratory Syncytial Virus NOT DETECTED NOT DETECTED Final   Bordetella pertussis NOT DETECTED NOT DETECTED Final   Chlamydophila pneumoniae NOT DETECTED NOT DETECTED Final   Mycoplasma pneumoniae NOT  DETECTED NOT DETECTED Final    Comment: Performed at [facility]  Blood Culture (routine x 2)     Status: None   Collection Time: 11/24/16  2:10 AM  Result Value Ref Range Status   Specimen Description BLOOD LEFT HAND  Final   Special Requests BOTTLES DRAWN AEROBIC AND ANAEROBIC 5ML  Final   Culture   Final    NO GROWTH 5 DAYS Performed at [facility]    Report Status 11/30/2016 FINAL  Final  Blood Culture (routine x 2)     Status: None   Collection Time: 11/24/16  2:12 AM  Result Value Ref Range Status   Specimen Description BLOOD LEFT HAND  Final   Special Requests BOTTLES DRAWN AEROBIC AND ANAEROBIC 5ML  Final   Culture   Final    NO GROWTH 5 DAYS Performed at [facility]    Report Status 11/30/2016 FINAL  Final  Urine culture     Status: None   Collection Time: 11/24/16  3:33 AM  Result Value Ref Range Status   Specimen Description URINE, CLEAN CATCH  Final   Special Requests NONE  Final   Culture NO GROWTH Performed at [facility]   Final   Report Status 11/25/2016 FINAL  Final  MRSA PCR Screening     Status: None   Collection Time: 11/24/16  5:41 AM  Result Value Ref Range Status    MRSA by PCR NEGATIVE NEGATIVE Final    Comment:        The GeneXpert MRSA Assay (FDA approved for NASAL specimens only), is one component of a comprehensive MRSA colonization surveillance program. It is not intended to diagnose MRSA infection nor to guide or monitor treatment for MRSA infections.   Culture, body fluid-bottle     Status: None   Collection Time: 11/25/16 10:27 AM  Result Value Ref Range Status   Specimen Description PLEURAL  Final   Special Requests NONE  Final   Culture   Final    NO GROWTH 5 DAYS Performed at [facility]    Report Status 11/30/2016 FINAL  Final  Gram stain     Status: None   Collection Time: 11/25/16 10:27 AM  Result Value Ref Range Status   Specimen Description PLEURAL  Final   Special Requests NONE  Final   Gram Stain   Final    FEW WBC PRESENT, PREDOMINANTLY MONONUCLEAR NO ORGANISMS SEEN Performed at [facility]    Report Status 11/25/2016 FINAL  Final     Radiology Studies: No results found.  Scheduled Meds: . sodium chloride   Intravenous Once  . chlorhexidine  15 mL Mouth Rinse BID  . enoxaparin (LOVENOX) injection  30 mg Subcutaneous Daily  . feeding supplement  1 Container Oral BID BM  . feeding supplement (ENSURE ENLIVE)  237 mL Oral BID BM  . guaiFENesin  1,200 mg Oral BID  . ipratropium-albuterol  3 mL Nebulization QID  . mouth rinse  15 mL Mouth Rinse q12n4p  . multivitamin with minerals  1 tablet Oral Daily  . sodium chloride flush  3 mL Intravenous Q12H   Continuous Infusions:    LOS: 8 days   Time spent: 25 minutes.  Vance Gather, MD [facility] Pager [phone]  If 7PM-7AM, please contact night-coverage [email or web address] Password [email or web address] 12/27/2016, 9:01 AM

## 2016-12-19 DEATH — deceased

## 2016-12-20 ENCOUNTER — Ambulatory Visit: Payer: BLUE CROSS/BLUE SHIELD

## 2016-12-26 ENCOUNTER — Encounter: Payer: BLUE CROSS/BLUE SHIELD | Admitting: Cardiothoracic Surgery

## 2016-12-26 IMAGING — DX DG CHEST 2V
2 series · 2 of 2 positions shown · non-contrast
Comparison: Radiograph April 24, 2016.

CLINICAL DATA: History is esophageal cancer, pleural effusion.

EXAM:
CHEST  2 VIEW

[chest pa]
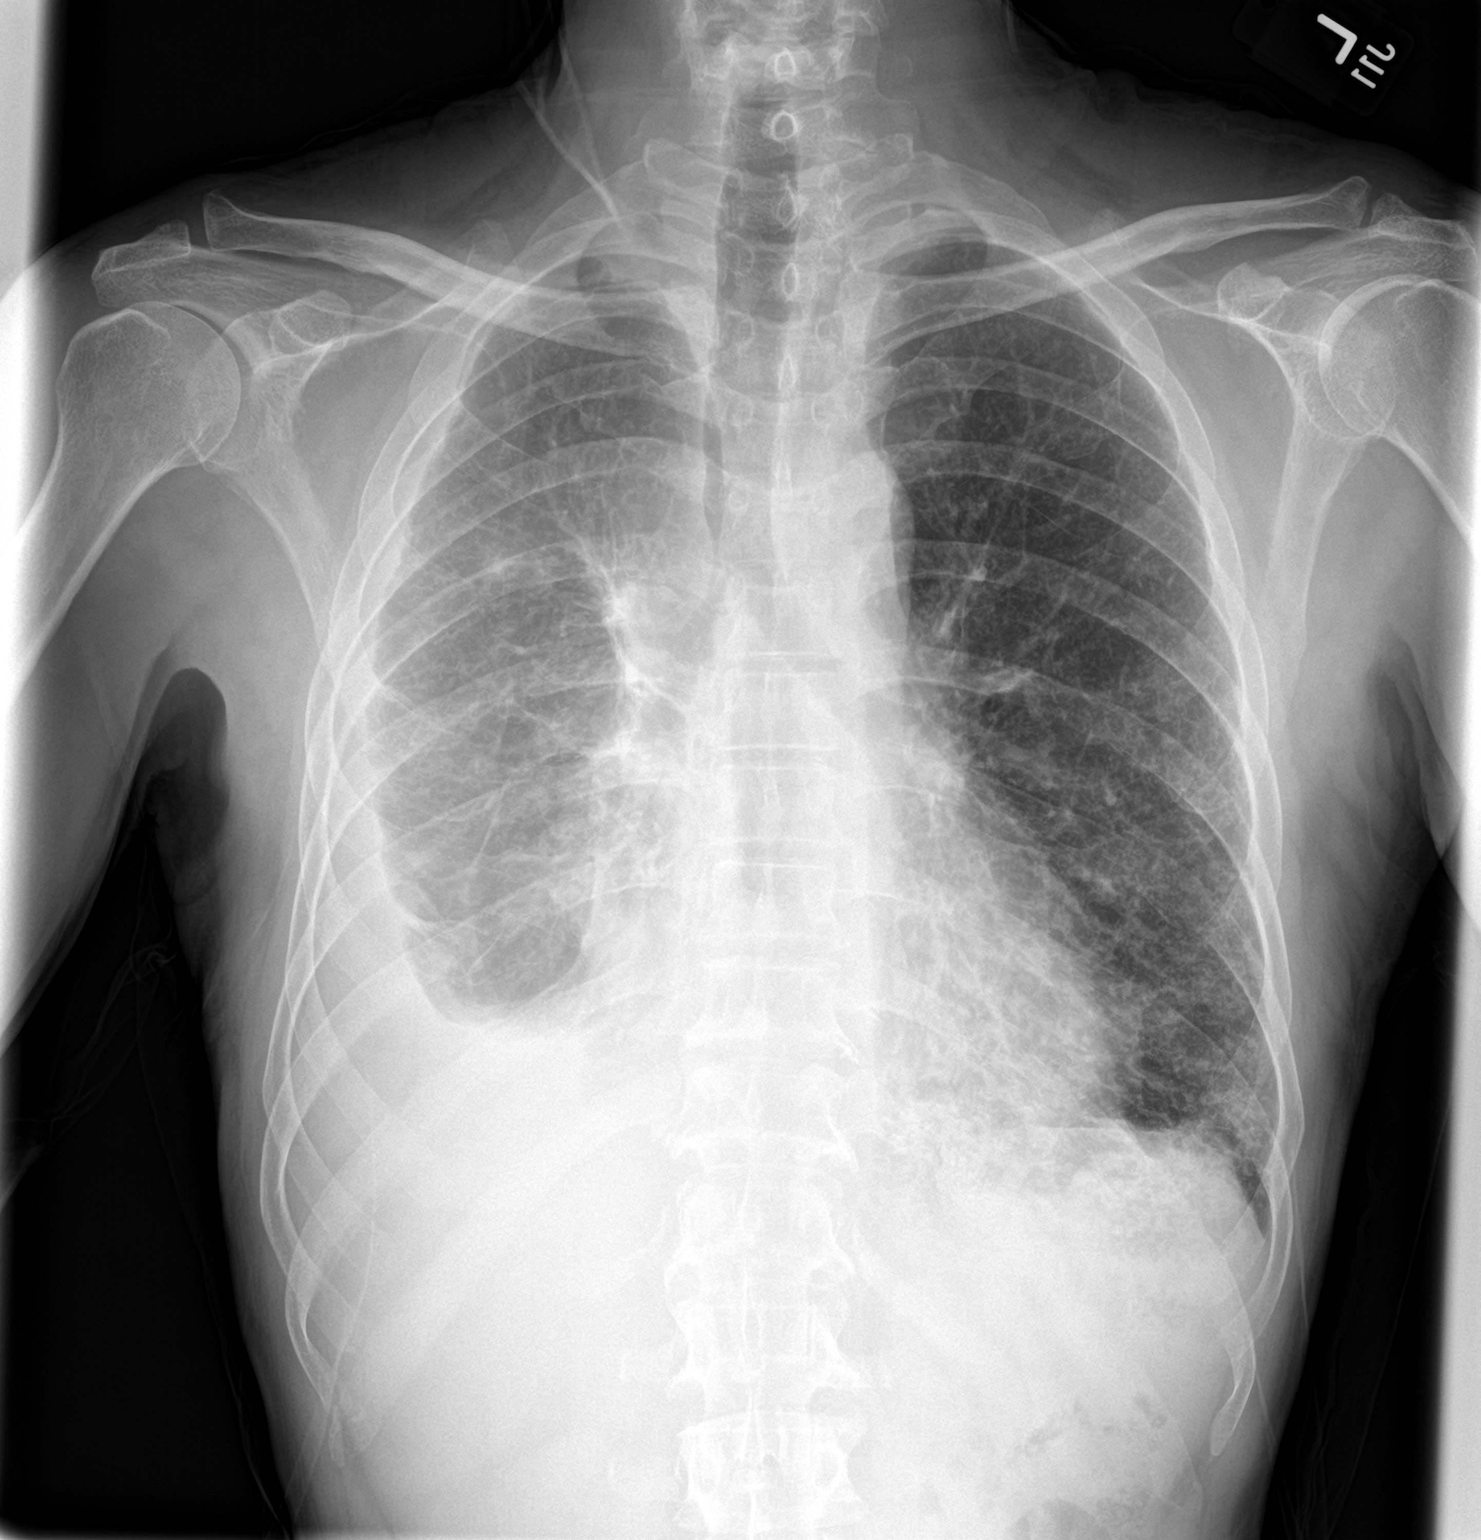

[chest lat]
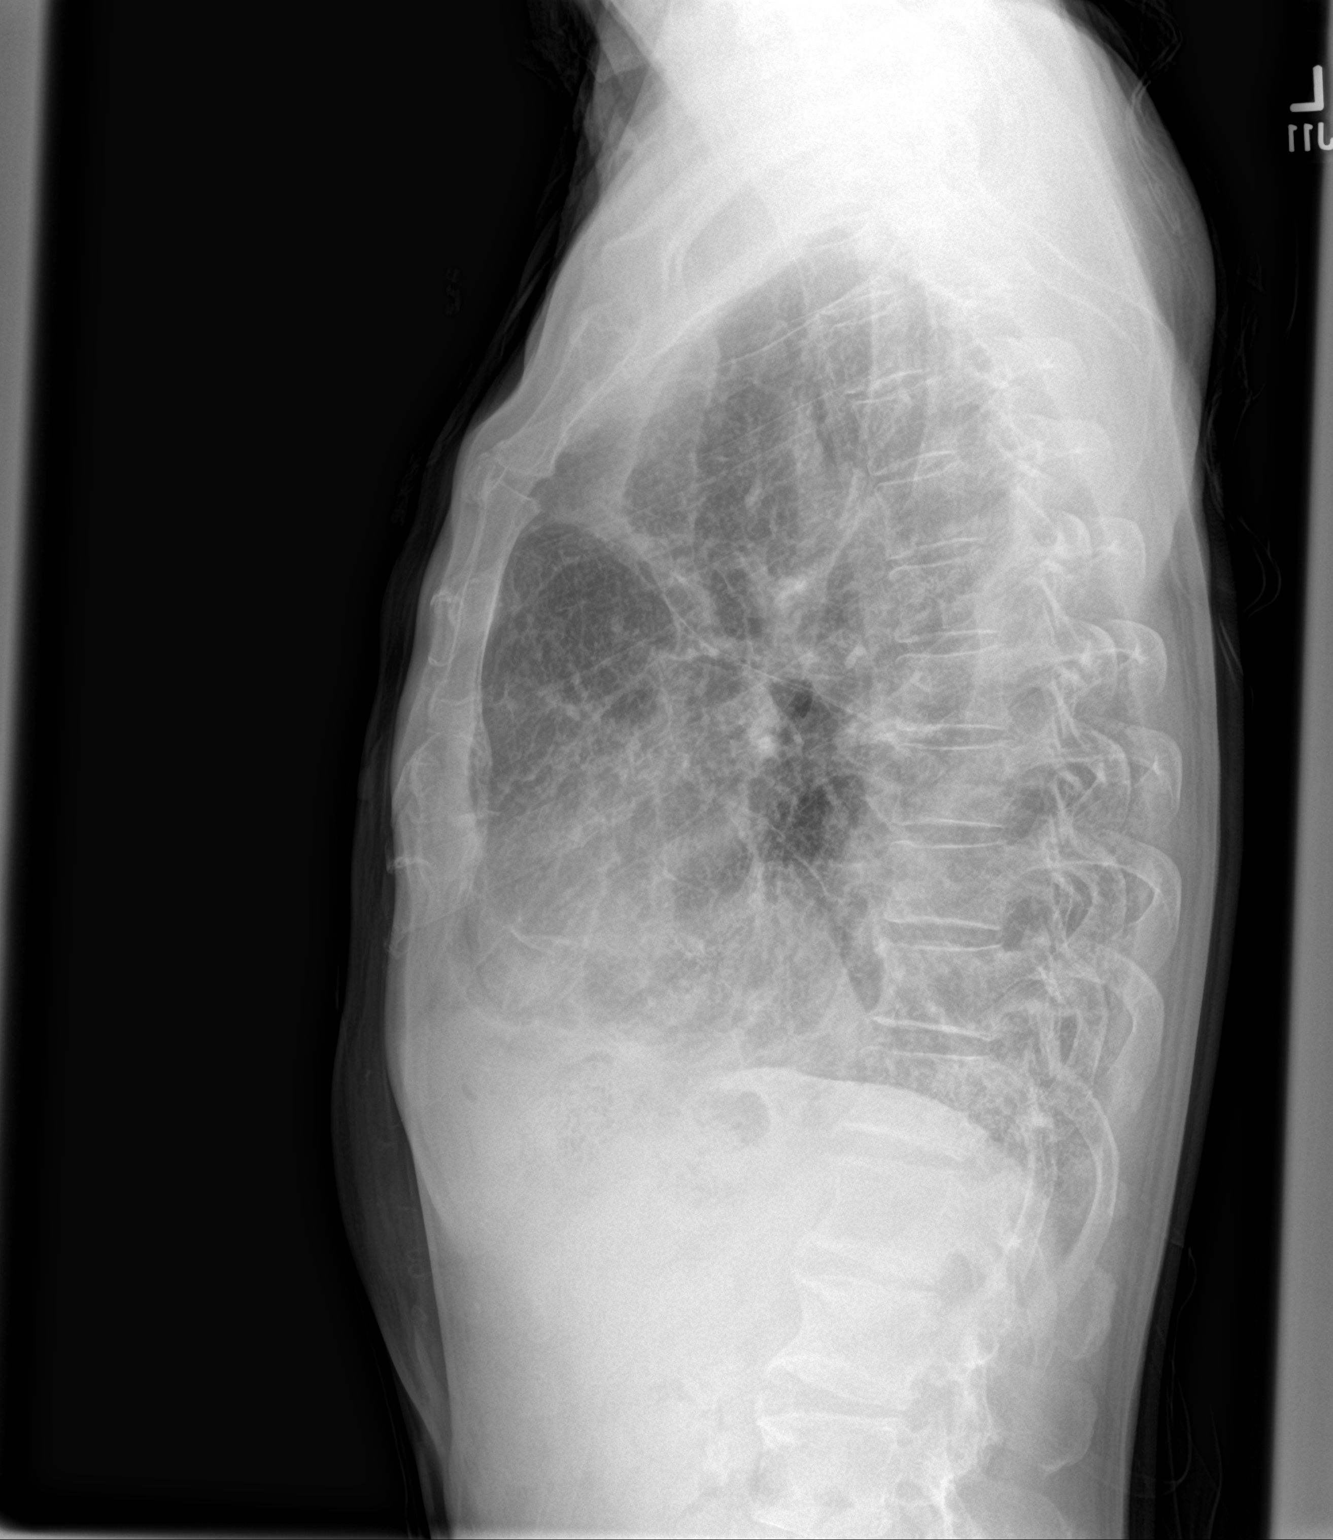

[2 of 2 positions shown; findings below may reference images not displayed]

FINDINGS: Stable cardiomediastinal silhouette. No pneumothorax is noted.
Moderate loculated right pleural effusion is noted which is
decreased compared to prior exam. There is associated atelectasis or
scarring seen in the right lung. Stable interstitial densities are
noted throughout the left lung base consistent with chronic
interstitial lung disease or scarring. Bony thorax is unremarkable.
IMPRESSION: Moderate loculated right pleural effusion is noted which is slightly
decreased compared to prior exam with associated atelectasis or
scarring in the right lung.

## 2016-12-27 ENCOUNTER — Ambulatory Visit: Payer: BLUE CROSS/BLUE SHIELD
